# Patient Record
Sex: Female | Born: 1940 | Race: White | Hispanic: No | Smoking: Former smoker
Health system: Southern US, Community
[De-identification: ages and names within clinical notes are randomized; demographics above are authoritative.]

## PROBLEM LIST (undated history)

## (undated) DIAGNOSIS — K219 Gastro-esophageal reflux disease without esophagitis: Secondary | ICD-10-CM

## (undated) DIAGNOSIS — M1990 Unspecified osteoarthritis, unspecified site: Secondary | ICD-10-CM

## (undated) DIAGNOSIS — K5790 Diverticulosis of intestine, part unspecified, without perforation or abscess without bleeding: Secondary | ICD-10-CM

## (undated) DIAGNOSIS — I4891 Unspecified atrial fibrillation: Secondary | ICD-10-CM

## (undated) DIAGNOSIS — R0600 Dyspnea, unspecified: Secondary | ICD-10-CM

## (undated) DIAGNOSIS — I10 Essential (primary) hypertension: Secondary | ICD-10-CM

## (undated) DIAGNOSIS — C50919 Malignant neoplasm of unspecified site of unspecified female breast: Secondary | ICD-10-CM

## (undated) DIAGNOSIS — K449 Diaphragmatic hernia without obstruction or gangrene: Secondary | ICD-10-CM

## (undated) DIAGNOSIS — F419 Anxiety disorder, unspecified: Secondary | ICD-10-CM

## (undated) DIAGNOSIS — Z79890 Hormone replacement therapy (postmenopausal): Secondary | ICD-10-CM

## (undated) DIAGNOSIS — M8580 Other specified disorders of bone density and structure, unspecified site: Secondary | ICD-10-CM

## (undated) DIAGNOSIS — E785 Hyperlipidemia, unspecified: Secondary | ICD-10-CM

## (undated) HISTORY — DX: Unspecified atrial fibrillation: I48.91

## (undated) HISTORY — DX: Essential (primary) hypertension: I10

## (undated) HISTORY — DX: Diaphragmatic hernia without obstruction or gangrene: K44.9

## (undated) HISTORY — DX: Malignant neoplasm of unspecified site of unspecified female breast: C50.919

## (undated) HISTORY — DX: Other specified disorders of bone density and structure, unspecified site: M85.80

## (undated) HISTORY — DX: Diverticulosis of intestine, part unspecified, without perforation or abscess without bleeding: K57.90

## (undated) HISTORY — PX: CATARACT EXTRACTION W/ INTRAOCULAR LENS IMPLANT: SHX1309

## (undated) HISTORY — DX: Hyperlipidemia, unspecified: E78.5

## (undated) HISTORY — DX: Hormone replacement therapy: Z79.890

## (undated) HISTORY — DX: Gastro-esophageal reflux disease without esophagitis: K21.9

---

## 1993-04-22 HISTORY — PX: HYSTEROSCOPY: SHX211

## 1997-04-22 HISTORY — PX: BREAST SURGERY: SHX581

## 1997-05-05 ENCOUNTER — Other Ambulatory Visit: Admission: RE | Admit: 1997-05-05 | Discharge: 1997-05-05 | Payer: Self-pay | Admitting: Radiology

## 1998-05-31 ENCOUNTER — Other Ambulatory Visit: Admission: RE | Admit: 1998-05-31 | Discharge: 1998-05-31 | Payer: Self-pay | Admitting: Radiology

## 1998-08-29 ENCOUNTER — Other Ambulatory Visit: Admission: RE | Admit: 1998-08-29 | Discharge: 1998-08-29 | Payer: Self-pay | Admitting: Physical Therapy

## 1999-09-05 ENCOUNTER — Other Ambulatory Visit: Admission: RE | Admit: 1999-09-05 | Discharge: 1999-09-05 | Payer: Self-pay | Admitting: Obstetrics and Gynecology

## 2000-09-05 ENCOUNTER — Other Ambulatory Visit: Admission: RE | Admit: 2000-09-05 | Discharge: 2000-09-05 | Payer: Self-pay | Admitting: Obstetrics and Gynecology

## 2001-08-25 ENCOUNTER — Other Ambulatory Visit: Admission: RE | Admit: 2001-08-25 | Discharge: 2001-08-25 | Payer: Self-pay | Admitting: *Deleted

## 2002-01-23 ENCOUNTER — Encounter (INDEPENDENT_AMBULATORY_CARE_PROVIDER_SITE_OTHER): Payer: Self-pay | Admitting: *Deleted

## 2002-01-23 ENCOUNTER — Ambulatory Visit (HOSPITAL_COMMUNITY): Admission: RE | Admit: 2002-01-23 | Discharge: 2002-01-23 | Payer: Self-pay | Admitting: *Deleted

## 2002-06-23 HISTORY — PX: BREAST DUCTAL SYSTEM EXCISION: SHX5242

## 2002-07-06 ENCOUNTER — Encounter (INDEPENDENT_AMBULATORY_CARE_PROVIDER_SITE_OTHER): Payer: Self-pay | Admitting: Specialist

## 2002-07-06 ENCOUNTER — Ambulatory Visit (HOSPITAL_BASED_OUTPATIENT_CLINIC_OR_DEPARTMENT_OTHER): Admission: RE | Admit: 2002-07-06 | Discharge: 2002-07-06 | Payer: Self-pay | Admitting: General Surgery

## 2002-08-27 ENCOUNTER — Other Ambulatory Visit: Admission: RE | Admit: 2002-08-27 | Discharge: 2002-08-27 | Payer: Self-pay | Admitting: Obstetrics and Gynecology

## 2003-09-07 ENCOUNTER — Other Ambulatory Visit: Admission: RE | Admit: 2003-09-07 | Discharge: 2003-09-07 | Payer: Self-pay | Admitting: Obstetrics and Gynecology

## 2004-09-11 ENCOUNTER — Other Ambulatory Visit: Admission: RE | Admit: 2004-09-11 | Discharge: 2004-09-11 | Payer: Self-pay | Admitting: Obstetrics and Gynecology

## 2005-06-07 ENCOUNTER — Encounter: Admission: RE | Admit: 2005-06-07 | Discharge: 2005-06-07 | Payer: Self-pay | Admitting: General Surgery

## 2006-09-10 ENCOUNTER — Ambulatory Visit (HOSPITAL_COMMUNITY): Admission: RE | Admit: 2006-09-10 | Discharge: 2006-09-10 | Payer: Self-pay | Admitting: *Deleted

## 2006-09-23 DIAGNOSIS — K5790 Diverticulosis of intestine, part unspecified, without perforation or abscess without bleeding: Secondary | ICD-10-CM

## 2006-09-23 HISTORY — DX: Diverticulosis of intestine, part unspecified, without perforation or abscess without bleeding: K57.90

## 2006-10-10 ENCOUNTER — Other Ambulatory Visit: Admission: RE | Admit: 2006-10-10 | Discharge: 2006-10-10 | Payer: Self-pay | Admitting: Obstetrics and Gynecology

## 2006-10-28 ENCOUNTER — Encounter (INDEPENDENT_AMBULATORY_CARE_PROVIDER_SITE_OTHER): Payer: Self-pay | Admitting: *Deleted

## 2006-10-28 ENCOUNTER — Ambulatory Visit (HOSPITAL_COMMUNITY): Admission: RE | Admit: 2006-10-28 | Discharge: 2006-10-28 | Payer: Self-pay | Admitting: *Deleted

## 2007-07-01 ENCOUNTER — Encounter: Admission: RE | Admit: 2007-07-01 | Discharge: 2007-07-01 | Payer: Self-pay | Admitting: Surgery

## 2007-08-17 ENCOUNTER — Emergency Department (HOSPITAL_COMMUNITY): Admission: EM | Admit: 2007-08-17 | Discharge: 2007-08-17 | Payer: Self-pay | Admitting: Emergency Medicine

## 2007-10-01 ENCOUNTER — Encounter (INDEPENDENT_AMBULATORY_CARE_PROVIDER_SITE_OTHER): Payer: Self-pay | Admitting: *Deleted

## 2007-10-01 ENCOUNTER — Ambulatory Visit (HOSPITAL_COMMUNITY): Admission: RE | Admit: 2007-10-01 | Discharge: 2007-10-01 | Payer: Self-pay | Admitting: *Deleted

## 2007-10-27 ENCOUNTER — Inpatient Hospital Stay (HOSPITAL_COMMUNITY): Admission: AD | Admit: 2007-10-27 | Discharge: 2007-10-28 | Payer: Self-pay | Admitting: Internal Medicine

## 2007-10-27 ENCOUNTER — Encounter: Payer: Self-pay | Admitting: Emergency Medicine

## 2008-11-05 LAB — HM PAP SMEAR

## 2009-09-22 DIAGNOSIS — I4891 Unspecified atrial fibrillation: Secondary | ICD-10-CM

## 2009-09-22 HISTORY — DX: Unspecified atrial fibrillation: I48.91

## 2009-10-12 ENCOUNTER — Ambulatory Visit (HOSPITAL_COMMUNITY): Admission: RE | Admit: 2009-10-12 | Discharge: 2009-10-12 | Payer: Self-pay | Admitting: Interventional Cardiology

## 2010-02-06 ENCOUNTER — Ambulatory Visit: Admit: 2010-02-06 | Payer: Self-pay | Admitting: Internal Medicine

## 2010-03-14 ENCOUNTER — Encounter: Payer: Self-pay | Admitting: Gastroenterology

## 2010-03-21 NOTE — Letter (Signed)
Summary: New Patient letter  [facility]  [address] [place], [place] [address]   Phone: [phone]  Fax: [phone]       03/14/2010 MRN: [id number]  Anna Mathews [address] [place], [place]  [address]  [place]  Dear Anna Mathews,  Welcome to the Gastroenterology Division at [facility].    You are scheduled to see Dr.  Christella Hartigan on 04-21-10 at 2:45pm on the 3rd floor at [facility], [address]  We ask that you try to arrive at our office 15 minutes prior to your appointment time to allow for check-in.  We would like you to complete the enclosed self-administered evaluation form prior to your visit and bring it with you on the day of your appointment.  We will review it with you.  Also, please bring a complete list of all your medications or, if you prefer, bring the medication bottles and we will list them.  Please bring your insurance card so that we may make a copy of it.  If your insurance requires a referral to see a specialist, please bring your referral form from your primary care physician.  Co-payments are due at the time of your visit and may be paid by cash, check or credit card.     Your office visit will consist of a consult with your physician (includes a physical exam), any laboratory testing he/she may order, scheduling of any necessary diagnostic testing (e.g. x-ray, ultrasound, CT-scan), and scheduling of a procedure (e.g. Endoscopy, Colonoscopy) if required.  Please allow enough time on your schedule to allow for any/all of these possibilities.    If you cannot keep your appointment, please call [phone] to cancel or reschedule prior to your appointment date.  This allows [place] the opportunity to schedule an appointment for another patient in need of care.  If you do not cancel or reschedule by 5 p.m. the business day prior to your appointment date, you will be charged a $50.00 late cancellation/no-show fee.    Thank you for choosing  [facility] for your medical needs.  We appreciate the opportunity to care for you.  Please visit [place] at our website  to learn more about our practice.                     Sincerely,                                                             The Gastroenterology Division

## 2010-04-06 LAB — PROTIME-INR
INR: 2.08 — ABNORMAL HIGH (ref 0.00–1.49)
Prothrombin Time: 23.5 seconds — ABNORMAL HIGH (ref 11.6–15.2)

## 2010-04-21 ENCOUNTER — Ambulatory Visit: Payer: Self-pay | Admitting: Gastroenterology

## 2010-06-06 NOTE — Op Note (Signed)
NAMEEddie, Anna Mathews Mckynlie                 ACCOUNT NO.:  [id number]   MEDICAL RECORD NO.:  [id number]          PATIENT TYPE:  AMB   LOCATION:  ENDO                         FACILITY[facility]  [facility]   PHYSICIAN:  Georgiana Spinner, M.D.    DATE OF BIRTH:  [birth year]   DATE OF PROCEDURE:  DATE OF DISCHARGE:                               OPERATIVE REPORT   PROCEDURE:  Upper endoscopy.   INDICATIONS:  Abdominal pain.   ANESTHESIA:  Fentanyl 75 mcg, Versed 8 mg.   DESCRIPTION OF PROCEDURE:  With the patient mildly sedated in the left  lateral decubitus position, the Pentax videoscopic endoscope was  inserted in the mouth and passed under direct vision through the  esophagus which appeared normal until we reached the distal esophagus  and there were two distinct islands of what appeared to be Barrett's  esophagus, photographed and biopsies taken.  We entered into the stomach  through a hiatal hernia.  Fundus, body, antrum, duodenal bulb and second  portion of the duodenum were visualized and a biopsy for Helicobacter  pylori was taken at the patient's request from the antrum.  The  endoscope was then slowly withdrawn, taking circumferential views of the  duodenal mucosa until the endoscope had been pulled back into the  stomach and placed in retroflexion to view the stomach from below.  The  endoscope was straightened and withdrawn, taking circumferential views  of remaining gastric and esophageal mucosa.  The patient vital signs and  pulse oximeter remained stable.  The patient tolerated the procedure  well without apparent complications.   FINDINGS:  Loose wrap of the GE junction around the endoscope indicating  laxity of the lower esophageal sphincter, associated with a hiatal  hernia and areas that appeared to be Barrett's esophagus endoscopically.   PLAN:  Await biopsy report.  The patient will call me for results and  follow-up with me as an outpatient.           ______________________________  Georgiana Spinner, M.D.     GMO/MEDQ  D:  10/01/2007  T:  10/01/2007  Job:  [id number]   cc:   Caryn Bee L. Little, M.D.  Fax: [phone]

## 2010-06-06 NOTE — Op Note (Signed)
NAMEKalyssa, Anna Mathews                 ACCOUNT NO.:  [id number]   MEDICAL RECORD NO.:  [id number]          PATIENT TYPE:  AMB   LOCATION:  ENDO                         FACILITY:  [facility]   PHYSICIAN:  Georgiana Spinner, M.D.    DATE OF BIRTH:  Sep 12, 1940   DATE OF PROCEDURE:  10/28/2006  DATE OF DISCHARGE:                               OPERATIVE REPORT   PROCEDURE:  Upper endoscopy.   INDICATIONS:  GI bleeding with black stools reported.   ANESTHESIA:  Fentanyl 50 mcg, Versed 5 mg.   PROCEDURE:  With the patient mildly sedated in the left lateral  decubitus position, the Pentax videoscopic endoscope was inserted in the  mouth, passed under direct vision through the esophagus which appeared  normal until we reached distal esophagus and she had two small islands  of definite Barrett's esophagus photographed and biopsies taken.  We  entered into the stomach and the fundus, body and antrum, all appeared  normal.  There was no evidence of bleeding actively at this time.  Duodenal bulb, second portion of duodenum also appeared normal.  From  this point, the endoscope was slowly withdrawn taking circumferential  views of the duodenal mucosa until the endoscope had been pulled back  into the stomach, placed in retroflexion to view the stomach from below.  The endoscope was straightened and withdrawn, taking circumferential  views of the remaining gastric and esophageal mucosa.  [name]  vital signs and pulse oximeter remained stable.  The patient tolerated  the procedure well without apparent complications.   FINDINGS:  Barrett's esophagus with a very loose wrap of the GE junction  around the endoscope indicating laxity of the lower esophageal  sphincter, otherwise an unremarkable examination.  The patient reports  that the black stools have ceased.   PLAN:  Await biopsy report.  Continue the Nexium.  The patient will call  me for results and follow-up with me as an outpatient.      ______________________________  Georgiana Spinner, M.D.     GMO/MEDQ  D:  10/28/2006  T:  10/28/2006  Job:  [id number]   cc:   Caryn Bee L. Little, M.D.  Fax: [phone]

## 2010-06-06 NOTE — Assessment & Plan Note (Signed)
[facility]                                 ON-CALL NOTE   NAME:Mathews, Anna                          MRN:          [id number]  DATE:08/17/2007                            DOB:          08-Sep-1940    PHONE NUMBER:  [phone].   GASTROENTEROLOGIST:  Georgiana Spinner, M.D.   PRIMARY CARE PHYSICIAN:  Caryn Bee L. Little, M.D.   REASON FOR THE CALL:  Abdominal pain and dark stools.   DATE OF THE CALL:  August 17, 2007.   TIME OF THE CALL:  11:50 a.m.   BODY:  The patient called today complaining of abdominal pain and dark  stools.  She says she has had some dizziness for about 3 weeks.  Also,  says she has had problems with her hiatal hernia.  Tells me that she  had problems with abdominal pain in the past for which she underwent  colonoscopy and upper endoscopy with Dr. Virginia Rochester.  She tells me that  nothing was found, though she may have some precancerous spots in her  duodenum.  She also states that she has had some heart fluttering.  Yesterday, she developed one dark tarry stool.  Two days prior to that,  she had been using Mylanta or Maalox.  She had one small dark stools  this morning.  Her son checked her blood pressure and pulse (he is a  Theatre stage manager), which were stable.  She takes aspirin and Pepcid  daily.  She was wondering what to do.  I told the patient that I could  not exclude GI bleed.  Dark stools could be secondary to the antacid.  I  recommended that she go to [facility] for evaluation by the  emergency room personal.  She was agreeable and appreciative.     Wilhemina Bonito. Marina Goodell, MD  Electronically Signed    JNP/MedQ  DD: 08/17/2007  DT: 08/17/2007  Job #: [id number]   cc:   Georgiana Spinner, M.D.

## 2010-06-06 NOTE — H&P (Signed)
NAME:  Narramore, Madyson                 ACCOUNT NO.:  [id number]   MEDICAL RECORD NO.:  [id number]          PATIENT TYPE:  INP   LOCATION:  6741                         FACILITY:  [facility]   PHYSICIAN:  Corinna L. Lendell Caprice, MDDATE OF BIRTH:  11/05/1940   DATE OF ADMISSION:  10/27/2007  DATE OF DISCHARGE:  10/28/2007                              HISTORY & PHYSICAL   CHIEF COMPLAINT:  Passed out.   HISTORY OF PRESENT ILLNESS:  Ms. Buttery is a pleasant 70 year old white  female with history of frequent left upper quadrant pain that has been  ascribed to gastroesophageal reflux disease.  She reports that her pain  has been escalating and becoming more frequent.  It is bad enough that  she has not been eating as much and has lost 6 pounds recently.  She is  also on hydrochlorothiazide and Prinivil.  She was gardening when she  began having left upper quadrant cramping, which was typical for her  usual reflux pains.  She describes some as spasms.  She had no chest  pain.  She had no shortness of breath.  She subsequently took a shower  and the next thing she can recall, she was on the bathroom floor.  She  hit her head, but had no confusion after the episode.  She has had  multiple endoscopies and apparently other workup for this pain.  She had  been on Carafate in the past. but stopped this.  She also had been on it  sounds like 40 mg of Prilosec, but decreased it to 20 for some reason.  She feels well now.   PAST MEDICAL HISTORY:  Hypertension, hyperlipidemia, osteopenia, and  gastroesophageal reflux disease.   MEDICATIONS:  Hydrochlorothiazide dose unknown, simvastatin 40 mg a day,  Evista 60 mg a day, quinapril 40 mg a day, calcium with vitamin D, and  Prilosec 20 mg a day.   SOCIAL HISTORY:  The patient is married, retired Runner, broadcasting/film/video.  She does not  smoke.  She has no history of drugs or heavy drinking.   FAMILY HISTORY:  Her mother died in her 105s.  Her father died of COPD.  Her brother  is healthy.   REVIEW OF SYSTEMS:  As above, otherwise negative.   PHYSICAL EXAMINATION:  VITAL SIGNS:  Blood pressure in the emergency  room was 125/53, pulse 73, respiratory rate 20 and temperature 98.2.  GENERAL:  The patient is well-nourished, well-developed in no acute  distress.  HEENT:  Normocephalic and atraumatic.  Pupils equal, round, reactive to  light.  Sclerae nonicteric.  Moist mucous membranes.  She has a hematoma  over her occipital scalp area.  NECK:  Supple.  No lymphadenopathy.  LUNGS:  Clear to auscultation bilaterally without wheezes, rhonchi, or  rales.  CARDIOVASCULAR:  Regular rate and rhythm without murmurs, gallops, or  rubs.  ABDOMEN:  Normal bowel sounds, soft, nontender, and nondistended.  GU:  Deferred.  RECTAL:  Deferred.  EXTREMITIES:  No clubbing, cyanosis, or edema.  SKIN:  No rash.  NEUROLOGIC:  The patient is alert and oriented.  Cranial nerves,  sensory, and motor exam intact.  PSYCHIATRIC:  Normal affect.   LABORATORY DATA:  CBC unremarkable.  Basic metabolic panel significant  for BUN of 25 with a creatinine of 0.7.  Point-of-care enzymes negative.  Urinalysis negative.  EKG is not on the chart, but was reportedly  negative according to ED physician.  CT of the brain shows high left  parietal scalp hematoma, nothing acute.  CT of the C-spine shows no  fracture, left-sided foraminal narrowing at C2-3, right-sided foraminal  narrowing at C3-4, and left greater than right foraminal narrowing at C4-  5.  Chest x-ray, diffuse negative except for degenerative changes of the  thoracic spine.   ASSESSMENT AND PLAN:  1. Syncopal episode, suspect vasovagal in the setting of dehydration      and vasodilatation in the shower.  The patient will be placed on 23-      hour observation.  She will get intravenous fluids.  I will rule      out myocardial infarction.  Stop her hydrochlorothiazide.  Her      reflux has been quite bothersome and I will resume  her Carafate,      she had been on this in the past.  I do not think this is cardiac,      but if her symptoms continue after discharge, she may want cardiac      workup.  Unless, she has chest pain here or further symptoms, I do      not think that she needs a workup other than ruling out myocardial      infarction by cardiac enzymes.  She will remain on telemetry.  2. Hypertension.  See above.  I will decrease her Prinivil dose as      well.  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  4. Gastroesophageal reflux disease.      Corinna L. Lendell Caprice, MD  Electronically Signed     CLS/MEDQ  D:  10/28/2007  T:  10/29/2007  Job:  [id number]   cc:   Sigmund Hazel, M.D.

## 2010-06-06 NOTE — Op Note (Signed)
NAMECella, Anna Mathews                 ACCOUNT NO.:  [id number]   MEDICAL RECORD NO.:  [id number]          PATIENT TYPE:  AMB   LOCATION:  ENDO                         FACILITY:  [facility]   PHYSICIAN:  Georgiana Spinner, M.D.    DATE OF BIRTH:  [birth year]   DATE OF PROCEDURE:  09/10/2006  DATE OF DISCHARGE:                               OPERATIVE REPORT   PROCEDURE:  Colonoscopy.   INDICATIONS:  Rectal bleeding.   ANESTHESIA:  Fentanyl 150 mcg, Versed 15 mg.   PROCEDURE:  With the patient mildly sedated in the left lateral  decubitus position, the Pentax videoscopic colonoscope was inserted in  the rectum and passed under direct vision with pressure applied, and the  patient turned to her back.  Subsequently, her right side we were able  to advance the endoscope; to eventually find the cecum identified by  ileocecal valve and appendiceal orifice.  The cecum did not want to open  up originally,  so therefore it took some turning and pressure and  maneuvering of the scope to get in.  But, once we were able to see the  cecum, we were able to clear the cecum fairly well.  From this point the  colonoscope was then slowly withdrawn, taking circumferential views of  the colonic mucosa and stopping only in the rectum -- which appeared  normal on direct and showed hemorrhoids on retroflexed view.  The  endoscope was straightened and withdrawn.  [name] vital signs,  pulse oximetry remained stable.  The patient tolerated the procedure  well without apparent complications.   FINDINGS:  Tortuous colon, difficult to traverse; but we did reach the  cecum.   IMPRESSION:  Rectal bleeding, probably on the basis of hemorrhoidal  process.   PLAN:  To follow up with me as needed.  Repeat examination probably in  10 years, according to current guidelines           ______________________________  Georgiana Spinner, M.D.     GMO/MEDQ  D:  09/10/2006  T:  09/10/2006  Job:  [id number]

## 2010-06-09 NOTE — Discharge Summary (Signed)
NAME:  Anna Mathews, Anna Mathews                 ACCOUNT NO.:  [id number]   MEDICAL RECORD NO.:  [id number]          PATIENT TYPE:  INP   LOCATION:  6741                         FACILITY:  [facility]   PHYSICIAN:  Corinna L. Lendell Caprice, MDDATE OF BIRTH:  1940/02/23   DATE OF ADMISSION:  10/27/2007  DATE OF DISCHARGE:  10/28/2007                               DISCHARGE SUMMARY   DISCHARGE DIAGNOSES:  1. Syncope, most likely vasovagal in the setting of dehydration.  2. Esophageal reflux disease.  3. Degenerative disk disease.  4. Hypertension.   DISCHARGE MEDICATIONS:  1. Stop hydrochlorothiazide.  2. Decrease lisinopril to 20 mg a day.  3. Start Carafate slurry 1 gram before meals and at bedtime.  4. She may continue simvastatin 40 mg a day.  5. Evista 60 mg a day.  6. Calcium with vitamin D.  7. Prilosec 20 mg a day.   FOLLOWUP:  Follow up with Dr. Hyacinth Meeker in 2-4 weeks.   ACTIVITY:  Increase slowly.   CONDITION:  Stable.   CONSULTATIONS:  None.   PROCEDURES:  None.   DIET:  Should be low-cholesterol.   PERTINENT LABORATORY DATA:  Basic metabolic panel on admission  significant for a BUN of 21, creatinine 0.71, otherwise unremarkable.  Point-of-care enzymes negative.   SPECIAL STUDIES/RADIOLOGY:  EKG was not on the chart, but reportedly  normal according to ED physician.  CT of the brain showed high left  parietal scalp hematoma without underlying fracture.  CT of the cervical  spine showed no acute fracture, left-sided foraminal narrowing at C2-C3,  right-sided foraminal narrowing at C3-C4, and left greater than right  foraminal narrowing at C4-C5.  Also a chest x-ray two-views showed  nothing acute, degenerative changes of the thoracic spine.   HISTORY AND HOSPITAL COURSE:  [name] is a pleasant 70 year old  white female with a history of hypertension, gastroesophageal reflux  disease, and chronic recurrent left upper quadrant pain that has been  ascribed to her reflux disease.   She has been losing weight because of  the worsening pain.  Apparently, she has had multiple endoscopies for  workup.  She had been on hydrochlorothiazide and quinapril.  She had  felt this particularly severe bout of the pain and subsequently passed  out after having taken a shower.  She hit her head.  She had normal  vital signs on admission and hematoma over the occipital scalp area, but  otherwise unremarkable exam.  She was placed on 23-hour observation.  Given the fact that she was on hydrochlorothiazide and had a high BUN to  creatinine ratio, I felt that she may be slightly volume depleted.  She  received IV fluids and her hydrochlorothiazide was stopped.  Also, her  reflux pain has been bad enough that she has not been eating as much as  usual and has lost weight.  I started her on Carafate.  MI was ruled  out.  The patient had no recurrence of her pain or other symptoms and  was discharged home.  It was felt that this was vasovagal episode in the  setting  of dehydration and vasodilatation from the hot shower.      Corinna L. Lendell Caprice, MD  Electronically Signed     CLS/MEDQ  D:  12/03/2007  T:  12/04/2007  Job:  [id number]   cc:   Sigmund Hazel, M.D.

## 2010-06-09 NOTE — Op Note (Signed)
   NAME:  Anna Mathews, Anna Mathews                           ACCOUNT NO.:  [id number]   MEDICAL RECORD NO.:  [id number]                   PATIENT TYPE:  AMB   LOCATION:  ENDO                                 [facility]ACILITY[facility]  [facility]   PHYSICIAN:  Georgiana Spinner, M.D.                 DATE OF BIRTH:  1940/05/25   DATE OF PROCEDURE:  01/23/2002  DATE OF DISCHARGE:                                 OPERATIVE REPORT   PROCEDURE:  Colonoscopy.   INDICATIONS:  Colon cancer screening.   ANESTHESIA:  None further given.   DESCRIPTION OF PROCEDURE:  With the patient mildly sedated in the left  lateral decubitus position, the Olympus videoscopic colonoscope was inserted  in the rectum and passed under direct vision to the cecum, identified by  ileocecal valve and appendiceal orifice, both of which were photographed.  From this point the colonoscope was slowly withdrawn, taking circumferential  views of the entire colonic mucosa, stopping only in the rectum, which  appeared normal on direct and showed hemorrhoids on retroflexed view.  The  endoscope was straightened and withdrawn.  [name] vital signs and  pulse oximetry remained stable.  The patient tolerated the procedure well  without apparent complication.   FINDINGS:  Small internal hemorrhoids, rare diverticulum of the colon,  otherwise unremarkable colonoscopic examination.   PLAN:  Possibly repeat in five to 10 years.                                               Georgiana Spinner, M.D.    GMO/MEDQ  D:  01/23/2002  T:  01/23/2002  Job:  [id number]   cc:   Caryn Bee L. Little, M.D.  [address]  [place]  [place] [address]  Fax: [phone]

## 2010-06-09 NOTE — Op Note (Signed)
   NAME:  Anna Mathews, Anna Mathews                           ACCOUNT NO.:  [id number]   MEDICAL RECORD NO.:  [id number]                   PATIENT TYPE:  AMB   LOCATION:  DSC                                  FACILITY:  [facility]   PHYSICIAN:  Rose Phi. Maple Hudson, M.D.                DATE OF BIRTH:  1940/08/07   DATE OF PROCEDURE:  07/06/2002  DATE OF DISCHARGE:                                 OPERATIVE REPORT   PREOPERATIVE DIAGNOSIS:  Intraductal papilloma of the left breast.   POSTOPERATIVE DIAGNOSIS:  Intraductal papilloma of the left breast.   OPERATION:  Excision of duct system of the left breast.   SURGEON:  Rose Phi. Maple Hudson, M.D.   ANESTHESIA:  MAC.   INDICATIONS:  This patient had presented with a bloody nipple discharge  coming from about the 7 o'clock position of her left breast.  Ductogram  showed a filling defect and she is scheduled now for excision.   DESCRIPTION OF PROCEDURE:  The patient was placed on the operating table  with the arms extended on the arm board.  The left breast was prepped and  draped in the usual fashion.  Again, one could express a bloody nipple  discharge from right at the 7 o'clock position.  A circumareolar incision  was outlined with a pencil and we then thoroughly infiltrated this with  local anesthesia.  An incision was made and I dissected an areolar flap up  to underneath the nipple and then excised a wedge of the duct system for the  8, 7, and 6 o'clock positions.  Hemostasis was obtained with the cautery.  Subcuticular closure of 4-0 Monocryl and Steri-Strips was carried out.  Dressing applied.  The patient was transferred to the recovery room in  satisfactory condition having tolerated the procedure well.                                               Rose Phi. Maple Hudson, M.D.    PRY/MEDQ  D:  07/06/2002  T:  07/06/2002  Job:  [id number]

## 2010-06-09 NOTE — Op Note (Signed)
   NAME:  Anna Mathews, Anna Mathews                           ACCOUNT NO.:  [id number]   MEDICAL RECORD NO.:  [id number]                   PATIENT TYPE:  AMB   LOCATION:  ENDO                                 FACILITY:  [facility]   PHYSICIAN:  Georgiana Spinner, M.D.                 DATE OF BIRTH:  1940-07-02   DATE OF PROCEDURE:  DATE OF DISCHARGE:                                 OPERATIVE REPORT   PROCEDURE:  Upper endoscopy.   INDICATIONS:  Gastroesophageal reflux disease.   ANESTHESIA:  Demerol 60, Versed 7 mg.   PROCEDURE:  With the patient mildly sedated in the left lateral decubitus  position, the Olympus video endoscope was inserted into the mouth and passed  under direct visualization through the esophagus which appeared normal.  There was a small hiatal hernia which was photographed.   We entered into the stomach. The fundus appeared normal; the  body and  antrum showed changes of linear ulcerations and erosions and some scattered  erythematous changes consistent with possibly nonsteroidal anti-inflammatory  drug induced gastritis and erosions, possibly due to Fosamax I guess. This  was photographed and biopsied.   We entered into the duodenal bulb and the second portion of the duodenum.  Both appeared normal except for some mild erythematous changes in the bulb.   The endoscope was then pulled back, taking circumferential views of the  remaining duodenal mucosa. The endoscope was then pulled back into the  stomach and placed in retroflexion to view the stomach from below. Once  again the hiatal hernia was visualized. The endoscope was then straightened  and withdrawn, taking several more views of the remaining gastric and  esophageal mucosa.   [name] vital signs and pulse oximetry remained stable. The patient  tolerated the procedure well without apparent complications.   FINDINGS:  1. Small hiatal hernia.  2. Changes in body and antrum of erosions, biopsied, possibly  nonsteroidal     anti-inflammatory drug induced or possibly Fosamax induced.    PLAN:  Will await biopsy report. The patient will call me for results and  follow up with me as an outpatient. Proceed to colonoscopy as planned.                                               Georgiana Spinner, M.D.    GMO/MEDQ  D:  01/23/2002  T:  01/23/2002  Job:  [id number]   cc:   Caryn Bee L. Little, M.D.  [address]  [place]  [place] [address]  Fax: [phone]

## 2010-10-20 LAB — DIFFERENTIAL
Basophils Absolute: 0
Basophils Relative: 0
Eosinophils Absolute: 0
Eosinophils Relative: 0
Lymphocytes Relative: 21
Lymphs Abs: 2
Monocytes Absolute: 0.5
Monocytes Relative: 5
Neutro Abs: 6.8
Neutrophils Relative %: 73

## 2010-10-20 LAB — COMPREHENSIVE METABOLIC PANEL
ALT: 30
AST: 29
Albumin: 4.3
Alkaline Phosphatase: 55
BUN: 24 — ABNORMAL HIGH
CO2: 24
Calcium: 9.5
Chloride: 105
Creatinine, Ser: 0.77
GFR calc Af Amer: >60
GFR calc non Af Amer: >60
Glucose, Bld: 136 — ABNORMAL HIGH
Potassium: 3.5
Sodium: 139
Total Bilirubin: 0.4
Total Protein: 7.2

## 2010-10-20 LAB — CBC
HCT: 42.7
Hemoglobin: 14.3
MCHC: 33.4
MCV: 92.6
Platelets: 220
RBC: 4.61
RDW: 13
WBC: 9.3

## 2010-10-20 LAB — OCCULT BLOOD X 1 CARD TO LAB, STOOL: Fecal Occult Bld: NEGATIVE

## 2010-10-20 LAB — LIPASE, BLOOD: Lipase: 19

## 2010-10-23 LAB — BASIC METABOLIC PANEL
BUN: 21
CO2: 25
Calcium: 8.7
Chloride: 107
Creatinine, Ser: 0.71
GFR calc Af Amer: >60
GFR calc non Af Amer: >60
Glucose, Bld: 105 — ABNORMAL HIGH
Potassium: 3.9
Sodium: 139

## 2010-10-23 LAB — CARDIAC PANEL(CRET KIN+CKTOT+MB+TROPI)
CK, MB: 2.3
CK, MB: 2.6
Relative Index: INVALID
Relative Index: INVALID
Total CK: 65
Total CK: 74
Troponin I: 0.01
Troponin I: 0.03

## 2010-10-24 LAB — DIFFERENTIAL
Basophils Absolute: 0
Basophils Relative: 0
Eosinophils Absolute: 0.1
Eosinophils Relative: 1
Lymphocytes Relative: 18
Lymphs Abs: 1.7
Monocytes Absolute: 0.5
Monocytes Relative: 5
Neutro Abs: 7.4
Neutrophils Relative %: 77

## 2010-10-24 LAB — COMPREHENSIVE METABOLIC PANEL
ALT: 18
AST: 30
Albumin: 4.7
Alkaline Phosphatase: 73
BUN: 25 — ABNORMAL HIGH
CO2: 29
Calcium: 9.9
Chloride: 104
Creatinine, Ser: 0.7
GFR calc Af Amer: >60
GFR calc non Af Amer: >60
Glucose, Bld: 117 — ABNORMAL HIGH
Potassium: 3.9
Sodium: 145
Total Bilirubin: 0.3
Total Protein: 8

## 2010-10-24 LAB — CBC
HCT: 42.8
Hemoglobin: 14.5
MCHC: 33.9
MCV: 91.7
Platelets: 221
RBC: 4.66
RDW: 12.7
WBC: 9.7

## 2010-10-24 LAB — URINE CULTURE: Colony Count: >=100000

## 2010-10-24 LAB — POCT CARDIAC MARKERS
CKMB, poc: 2.1
Myoglobin, poc: 73.3
Troponin i, poc: <0.05

## 2010-10-24 LAB — URINALYSIS, ROUTINE W REFLEX MICROSCOPIC
Bilirubin Urine: NEGATIVE
Glucose, UA: NEGATIVE
Hgb urine dipstick: NEGATIVE
Ketones, ur: NEGATIVE
Nitrite: NEGATIVE
Protein, ur: NEGATIVE
Specific Gravity, Urine: 1.013
Urobilinogen, UA: 0.2
pH: 7.5

## 2010-10-24 LAB — PROTIME-INR
INR: 0.9
Prothrombin Time: 12.6

## 2010-10-24 LAB — URINE MICROSCOPIC-ADD ON

## 2011-01-04 DIAGNOSIS — L280 Lichen simplex chronicus: Secondary | ICD-10-CM | POA: Insufficient documentation

## 2011-09-19 LAB — HM DEXA SCAN

## 2011-09-19 LAB — HM MAMMOGRAPHY: HM Mammogram: NEGATIVE

## 2012-11-06 ENCOUNTER — Ambulatory Visit (INDEPENDENT_AMBULATORY_CARE_PROVIDER_SITE_OTHER): Payer: PRIVATE HEALTH INSURANCE | Admitting: Pharmacist

## 2012-11-06 ENCOUNTER — Encounter (INDEPENDENT_AMBULATORY_CARE_PROVIDER_SITE_OTHER): Payer: Self-pay

## 2012-11-06 DIAGNOSIS — I4891 Unspecified atrial fibrillation: Secondary | ICD-10-CM

## 2012-11-06 DIAGNOSIS — I480 Paroxysmal atrial fibrillation: Secondary | ICD-10-CM | POA: Insufficient documentation

## 2012-11-06 LAB — POCT INR: INR: 3

## 2012-12-11 ENCOUNTER — Ambulatory Visit (INDEPENDENT_AMBULATORY_CARE_PROVIDER_SITE_OTHER): Payer: PRIVATE HEALTH INSURANCE | Admitting: Pharmacist

## 2012-12-11 DIAGNOSIS — I4891 Unspecified atrial fibrillation: Secondary | ICD-10-CM

## 2012-12-11 LAB — POCT INR: INR: 3.1

## 2012-12-30 ENCOUNTER — Telehealth: Payer: Self-pay | Admitting: Interventional Cardiology

## 2012-12-30 NOTE — Telephone Encounter (Signed)
Returned call to pt, pt missed 2.5mg  dosage on 12/28/12, took 5mg  yesterday as rx.  Pt's last INR was 3.1 and we did not lower pt's dosage.  Discussed with Riki Rusk PharmD, advised pt to continue on same dosage 5mg  daily except 2.5mg  on Su, Tu, Th.  Keep scheduled f/u appt.  Pt verbalized understanding.

## 2012-12-30 NOTE — Telephone Encounter (Signed)
New Message  Pt called states she didn't take her coumadin on Sunday night// requests a call back to regulate//SR

## 2013-01-08 ENCOUNTER — Ambulatory Visit (INDEPENDENT_AMBULATORY_CARE_PROVIDER_SITE_OTHER): Payer: PRIVATE HEALTH INSURANCE | Admitting: *Deleted

## 2013-01-08 DIAGNOSIS — I4891 Unspecified atrial fibrillation: Secondary | ICD-10-CM

## 2013-01-08 LAB — POCT INR: INR: 2.3

## 2013-02-05 ENCOUNTER — Ambulatory Visit (INDEPENDENT_AMBULATORY_CARE_PROVIDER_SITE_OTHER): Payer: PRIVATE HEALTH INSURANCE | Admitting: Pharmacist

## 2013-02-05 DIAGNOSIS — I4891 Unspecified atrial fibrillation: Secondary | ICD-10-CM

## 2013-02-05 LAB — POCT INR: INR: 2

## 2013-02-24 ENCOUNTER — Other Ambulatory Visit: Payer: Self-pay | Admitting: *Deleted

## 2013-02-24 MED ORDER — RALOXIFENE HCL 60 MG PO TABS
60.0000 mg | ORAL_TABLET | Freq: Every day | ORAL | Status: DC
Start: 2013-02-24 — End: 2013-03-31

## 2013-02-24 NOTE — Telephone Encounter (Signed)
Last AEX and refill 03/10/2012 #3 mons/3 refills. Next appt 03/12/2013  Will refill until appt.

## 2013-03-05 ENCOUNTER — Ambulatory Visit (INDEPENDENT_AMBULATORY_CARE_PROVIDER_SITE_OTHER): Payer: PRIVATE HEALTH INSURANCE | Admitting: *Deleted

## 2013-03-05 DIAGNOSIS — I4891 Unspecified atrial fibrillation: Secondary | ICD-10-CM

## 2013-03-05 LAB — POCT INR: INR: 2.3

## 2013-03-10 ENCOUNTER — Encounter: Payer: Self-pay | Admitting: Nurse Practitioner

## 2013-03-12 ENCOUNTER — Ambulatory Visit: Payer: Self-pay | Admitting: Nurse Practitioner

## 2013-03-27 ENCOUNTER — Other Ambulatory Visit: Payer: Self-pay | Admitting: *Deleted

## 2013-03-27 NOTE — Telephone Encounter (Signed)
Incoming fax from [facility] requesting Vit D 50,000.  Last Vitamin D check 03/10/2012 results = 55 - Normal Last AEX and refill 03/10/2012 #26/3 refills Next appt 04/02/2013  Please advise.

## 2013-03-29 MED ORDER — VITAMIN D (ERGOCALCIFEROL) 1.25 MG (50000 UNIT) PO CAPS: 50000.0000 [IU] | ORAL_CAPSULE | ORAL | Status: DC

## 2013-03-31 ENCOUNTER — Ambulatory Visit (INDEPENDENT_AMBULATORY_CARE_PROVIDER_SITE_OTHER): Payer: PRIVATE HEALTH INSURANCE | Admitting: Nurse Practitioner

## 2013-03-31 ENCOUNTER — Encounter: Payer: Self-pay | Admitting: Nurse Practitioner

## 2013-03-31 VITALS — BP 118/70 | HR 56 | Resp 16 | Ht 63.5 in | Wt 153.0 lb

## 2013-03-31 DIAGNOSIS — E559 Vitamin D deficiency, unspecified: Secondary | ICD-10-CM

## 2013-03-31 DIAGNOSIS — M899 Disorder of bone, unspecified: Secondary | ICD-10-CM

## 2013-03-31 DIAGNOSIS — M949 Disorder of cartilage, unspecified: Secondary | ICD-10-CM

## 2013-03-31 DIAGNOSIS — I4891 Unspecified atrial fibrillation: Secondary | ICD-10-CM

## 2013-03-31 DIAGNOSIS — N6042 Mammary duct ectasia of left breast: Secondary | ICD-10-CM

## 2013-03-31 DIAGNOSIS — I10 Essential (primary) hypertension: Secondary | ICD-10-CM | POA: Insufficient documentation

## 2013-03-31 DIAGNOSIS — N6049 Mammary duct ectasia of unspecified breast: Secondary | ICD-10-CM

## 2013-03-31 DIAGNOSIS — M8580 Other specified disorders of bone density and structure, unspecified site: Secondary | ICD-10-CM | POA: Insufficient documentation

## 2013-03-31 DIAGNOSIS — Z01419 Encounter for gynecological examination (general) (routine) without abnormal findings: Secondary | ICD-10-CM

## 2013-03-31 MED ORDER — RALOXIFENE HCL 60 MG PO TABS: 60.0000 mg | ORAL_TABLET | Freq: Every day | ORAL | Status: DC

## 2013-03-31 MED ORDER — VITAMIN D (ERGOCALCIFEROL) 1.25 MG (50000 UNIT) PO CAPS: 50000.0000 [IU] | ORAL_CAPSULE | ORAL | Status: DC

## 2013-03-31 NOTE — Progress Notes (Signed)
Patient ID: Anna Mathews, female   DOB: [birth year], 73 y.o.   MRN: [id number] 73 y.o. G12P2002 Married Caucasian Fe here for annual exam.  Doing well except for husband who is age 19 and multiple health issues.  Patient's last menstrual period was 01/23/1988.          Sexually active: no  The current method of family planning is none.    Exercising: yes  Gym/ health club routine includes treadmill, yoga and and other machines.  Yoga once weekly.. Smoker:  no  Health Maintenance: Pap:  11/05/08, WNL MMG: 09/18/12, Bi-Rads 1: negative Colonoscopy:  2009, repeat 5 years BMD:   08/2011, -1.3/-1.6/-0.4 TDaP:  07/2009 Labs:  PCP   reports that she quit smoking about 45 years ago. Her smoking use included Cigarettes. She started smoking about 56 years ago. She has a 3 pack-year smoking history. She has never used smokeless tobacco. She reports that she drinks alcohol. She reports that she does not use illicit drugs.  Past Medical History  Diagnosis Date  . Hyperlipidemia   . Hypertension   . GERD (gastroesophageal reflux disease)   . Hiatal hernia   . Osteopenia   . Diverticulosis 09/2006  . Atrial fibrillation 09/2009    on coumadin  . Postmenopausal hormone replacement therapy 1989 - 07/2000    Past Surgical History  Procedure Laterality Date  . Breast surgery Left 4/99    breast biopsy - fibrosis  . Breast ductal system excision Left 06/2002    duct ectasia with fibrosis - atypical lobular hyperplasia with micro calcifications - tamoxifen X 28 months then change to Evista 10/06  . Hysteroscopy  4/95    w/D&C  . Cesarean section      X 2    Current Outpatient Prescriptions  Medication Sig Dispense Refill  . amLODipine (NORVASC) 5 MG tablet Take 1 tablet by mouth daily.      . Calcium Carbonate (CALTRATE 600 PO) Take 1 capsule by mouth 2 (two) times daily. Chocolate chew      . flecainide (TAMBOCOR) 100 MG tablet Take 1 tablet by mouth 2 (two) times daily.      . metoprolol  succinate (TOPROL-XL) 50 MG 24 hr tablet Take 0.5 tablets by mouth daily.      . Omega-3 Fatty Acids (OMEGA-3 PO) Take 4 tablets by mouth daily.      . raloxifene (EVISTA) 60 MG tablet Take 1 tablet (60 mg total) by mouth daily.  90 tablet  3  . simvastatin (ZOCOR) 20 MG tablet Take 1 tablet by mouth daily.      . Vitamin D, Ergocalciferol, (DRISDOL) 50000 UNITS CAPS capsule Take 1 capsule (50,000 Units total) by mouth every 7 (seven) days.  30 capsule  3  . warfarin (COUMADIN) 5 MG tablet as directed.       No current facility-administered medications for this visit.    Family History  Problem Relation Age of Onset  . Hypertension Mother   . Heart disease Father   . COPD Father   . Diabetes Maternal Aunt   . Diabetes Maternal Grandfather   . Breast cancer Maternal Grandfather 80  . Diabetes Maternal Aunt   . Diabetes Son   . Rheum arthritis Brother   . Kidney cancer Brother     ROS:  Pertinent items are noted in HPI.  Otherwise, a comprehensive ROS was negative.  Exam:   BP 118/70  Pulse 56  Resp 16  Ht 5' 3.5" (1.613  m)  Wt 153 lb (69.4 kg)  BMI 26.67 kg/m2  LMP 01/23/1988 Height: 5' 3.5" (161.3 cm)  Ht Readings from Last 3 Encounters:  03/31/13 5' 3.5" (1.613 m)    General appearance: alert, cooperative and appears stated age Head: Normocephalic, without obvious abnormality, atraumatic Neck: no adenopathy, supple, symmetrical, trachea midline and thyroid normal to inspection and palpation Lungs: clear to auscultation bilaterally Breasts: normal appearance, no masses or tenderness Heart: regular rate and rhythm Abdomen: soft, non-tender; no masses,  no organomegaly Extremities: extremities normal, atraumatic, no cyanosis or edema Skin: Skin color, texture, turgor normal. No rashes or lesions Lymph nodes: Cervical, supraclavicular, and axillary nodes normal. No abnormal inguinal nodes palpated Neurologic: Grossly normal   Pelvic: External genitalia:  no lesions               Urethra:  normal appearing urethra with no masses, tenderness or lesions              Bartholin's and Skene's: normal                 Vagina: atrophic appearing vagina with normal color and discharge, no lesions. There is a small vaginal wall cyst on the left midway.              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

## 2013-03-31 NOTE — Patient Instructions (Signed)

## 2013-04-01 LAB — VITAMIN D 25 HYDROXY (VIT D DEFICIENCY, FRACTURES): Vit D, 25-Hydroxy: 71 ng/mL (ref 30–89)

## 2013-04-02 ENCOUNTER — Ambulatory Visit (INDEPENDENT_AMBULATORY_CARE_PROVIDER_SITE_OTHER): Payer: PRIVATE HEALTH INSURANCE | Admitting: Pharmacist Clinician (PhC)/ Clinical Pharmacy Specialist

## 2013-04-02 DIAGNOSIS — I4891 Unspecified atrial fibrillation: Secondary | ICD-10-CM

## 2013-04-02 LAB — POCT INR: INR: 2.4

## 2013-04-03 NOTE — Progress Notes (Signed)
Encounter reviewed by Dr. Brook Silva.  

## 2013-04-09 ENCOUNTER — Telehealth: Payer: Self-pay | Admitting: *Deleted

## 2013-04-09 NOTE — Telephone Encounter (Signed)
I have attempted to contact this patient by phone with the following results: left message to return my call on answering machine (home).  

## 2013-04-09 NOTE — Telephone Encounter (Signed)
Message copied by Graylon Good on Thu Apr 09, 2013 10:42 AM ------      Message from: Kem Boroughs R      Created: Wed Apr 01, 2013  8:27 AM       Let patient know that Vit D is high at 21 and she can come off RX and go to OTC Vit D per protocol ------

## 2013-04-15 NOTE — Telephone Encounter (Signed)
I have attempted to contact this patient by phone with the following results: left message to return my call on answering machine (home).  

## 2013-05-01 ENCOUNTER — Ambulatory Visit (INDEPENDENT_AMBULATORY_CARE_PROVIDER_SITE_OTHER): Payer: PRIVATE HEALTH INSURANCE | Admitting: Pharmacist

## 2013-05-01 DIAGNOSIS — I4891 Unspecified atrial fibrillation: Secondary | ICD-10-CM

## 2013-05-01 LAB — POCT INR: INR: 2.8

## 2013-05-05 NOTE — Telephone Encounter (Signed)
I have attempted to contact this patient by phone with the following results: left message to return my call with female at home number.

## 2013-05-07 ENCOUNTER — Encounter: Payer: Self-pay | Admitting: *Deleted

## 2013-05-08 NOTE — Telephone Encounter (Signed)
Letter mailed on 05/07/13.  Closing encounter.

## 2013-06-09 ENCOUNTER — Ambulatory Visit (INDEPENDENT_AMBULATORY_CARE_PROVIDER_SITE_OTHER): Payer: PRIVATE HEALTH INSURANCE | Admitting: Interventional Cardiology

## 2013-06-09 ENCOUNTER — Encounter: Payer: Self-pay | Admitting: Interventional Cardiology

## 2013-06-09 ENCOUNTER — Ambulatory Visit (INDEPENDENT_AMBULATORY_CARE_PROVIDER_SITE_OTHER): Payer: PRIVATE HEALTH INSURANCE

## 2013-06-09 VITALS — BP 116/50 | HR 54 | Wt 154.0 lb

## 2013-06-09 DIAGNOSIS — Z7901 Long term (current) use of anticoagulants: Secondary | ICD-10-CM

## 2013-06-09 DIAGNOSIS — I4891 Unspecified atrial fibrillation: Secondary | ICD-10-CM

## 2013-06-09 DIAGNOSIS — I10 Essential (primary) hypertension: Secondary | ICD-10-CM

## 2013-06-09 LAB — POCT INR: INR: 2.4

## 2013-06-09 NOTE — Progress Notes (Signed)
Patient ID: Anna Mathews, female   DOB: 1940/10/30, 73 y.o.   MRN: [id number]    [place], [place], [place]  [address] Phone: [phone] Fax:  [phone]  Date:  06/09/2013   ID:  Anna Mathews, DOB 04/11/1940, MRN [id number]  PCP:  Tawanna Solo, MD      History of Present Illness: Anna Mathews is a 73 y.o. female who has AFib. We tried to see if she would tolerate less flecainide. She felt short of breath after a couple of days and resumed her usual dose. Atrial Fibrillation F/U:  Denies : Chest pain.  Dizziness.  Orthopnea.  Palpitations.  Syncope.  Some variability with coumadin dose.    Wt Readings from Last 3 Encounters:  06/09/13 154 lb (69.854 kg)  03/31/13 153 lb (69.4 kg)     Past Medical History  Diagnosis Date  . Hyperlipidemia   . Hypertension   . GERD (gastroesophageal reflux disease)   . Hiatal hernia   . Osteopenia   . Diverticulosis 09/2006  . Atrial fibrillation 09/2009    on coumadin  . Postmenopausal hormone replacement therapy 1989 - 07/2000    Current Outpatient Prescriptions  Medication Sig Dispense Refill  . amLODipine (NORVASC) 5 MG tablet Take 1 tablet by mouth daily.      . Calcium Carbonate (CALTRATE 600 PO) Take 1 capsule by mouth 2 (two) times daily. Chocolate chew      . flecainide (TAMBOCOR) 100 MG tablet Take 1 tablet by mouth 2 (two) times daily.      . metoprolol succinate (TOPROL-XL) 50 MG 24 hr tablet Take 0.5 tablets by mouth daily.      . Omega-3 Fatty Acids (OMEGA-3 PO) Take 4 tablets by mouth daily.      . quinapril (ACCUPRIL) 40 MG tablet Take 40 mg by mouth at bedtime.      . raloxifene (EVISTA) 60 MG tablet Take 1 tablet (60 mg total) by mouth daily.  90 tablet  3  . simvastatin (ZOCOR) 20 MG tablet Take 1 tablet by mouth daily.      . Vitamin D, Ergocalciferol, (DRISDOL) 50000 UNITS CAPS capsule Take 1 capsule (50,000 Units total) by mouth every 7 (seven) days.  30 capsule  3  . warfarin (COUMADIN)  5 MG tablet as directed.       No current facility-administered medications for this visit.    Allergies:    Allergies  Allergen Reactions  . Penicillins   . Sulfa Antibiotics     Social History:  The patient  reports that she quit smoking about 45 years ago. Her smoking use included Cigarettes. She started smoking about 56 years ago. She has a 3 pack-year smoking history. She has never used smokeless tobacco. She reports that she drinks alcohol. She reports that she does not use illicit drugs.   Family History:  [name] family history includes Breast cancer (age of onset: 27) in her maternal grandfather; COPD in her father; Diabetes in her maternal aunt, maternal aunt, maternal grandfather, and son; Heart disease in her father; Hypertension in her mother; Kidney cancer in her brother; Rheum arthritis in her brother.   ROS:  Please see the history of present illness.  No nausea, vomiting.  No fevers, chills.  No focal weakness.  No dysuria.    All other systems reviewed and negative.   PHYSICAL EXAM: VS:  BP 116/50  Pulse 54  Wt 154 lb (69.854 kg)  LMP 01/23/1988 Well nourished, well developed, in no acute distress HEENT: normal Neck: no JVD, no carotid bruits Cardiac:  normal S1, S2; RRR;  Lungs:  clear to auscultation bilaterally, no wheezing, rhonchi or rales Abd: soft, nontender, no hepatomegaly Ext: no edema Skin: warm and dry Neuro:   no focal abnormalities noted  EKG:  NSR, prolonged PR,IVCD     ASSESSMENT AND PLAN:  Atrial fibrillation  Continue Flecainide Acetate Tablet, 100 MG Not Specified, 1 tablet, Orally (new rx as of 10/31/11), twice a day Notes: maintaining sinus rhythm.    2. Admission for long-term (current) use of anticoagulants  Notes: INR to be checked and Coumadin dose to be adjusted with Pharm D. Please see his note for details.    3. Essential hypertension, benign  Continue Amlodipine Besylate Tablet, 5 MG Tablet, 1 tablet, Orally, Once a  day Continue Metoprolol Succinate Tablet Extended Release 24 Hour, 50 MG Tablet, 1/2 tablet, Orally, Once a day Continue Accupril Tablet, 40 MG, 1 tablet, Orally, Once a day Notes: Controlled at home. elevated here. She will bring her home cuff in at next visit to check accuracy.    Preventive Medicine  Adult topics discussed:  Diet: healthy diet.  Exercise: 5 days a week, at least 30 minutes of aerobic exercise.      Signed, Mina Marble, MD, [facility] 06/09/2013 3:28 PM

## 2013-06-09 NOTE — Patient Instructions (Signed)
Your physician recommends that you continue on your current medications as directed. Please refer to the Current Medication list given to you today.  Your physician wants you to follow-up in: 1 year with Dr. Varanasi. You will receive a reminder letter in the mail two months in advance. If you don't receive a letter, please call our office to schedule the follow-up appointment.  

## 2013-06-24 ENCOUNTER — Other Ambulatory Visit: Payer: Self-pay | Admitting: Interventional Cardiology

## 2013-07-22 ENCOUNTER — Other Ambulatory Visit: Payer: Self-pay | Admitting: Interventional Cardiology

## 2013-07-23 ENCOUNTER — Ambulatory Visit (INDEPENDENT_AMBULATORY_CARE_PROVIDER_SITE_OTHER): Payer: PRIVATE HEALTH INSURANCE | Admitting: *Deleted

## 2013-07-23 DIAGNOSIS — I4891 Unspecified atrial fibrillation: Secondary | ICD-10-CM

## 2013-07-23 LAB — POCT INR: INR: 2.1

## 2013-07-29 ENCOUNTER — Other Ambulatory Visit: Payer: Self-pay | Admitting: Interventional Cardiology

## 2013-08-23 ENCOUNTER — Other Ambulatory Visit: Payer: Self-pay | Admitting: Interventional Cardiology

## 2013-08-24 ENCOUNTER — Telehealth: Payer: Self-pay | Admitting: Interventional Cardiology

## 2013-08-24 NOTE — Telephone Encounter (Signed)
New Prob   Requesting direction clarification for Warfarin prescription. Please call.

## 2013-08-24 NOTE — Telephone Encounter (Signed)
Spoke with [facility] pharmacist and verified warfarin instruction and quantity.

## 2013-08-24 NOTE — Telephone Encounter (Signed)
I do not refill Warfarin will forward to [employer]

## 2013-09-03 ENCOUNTER — Ambulatory Visit (INDEPENDENT_AMBULATORY_CARE_PROVIDER_SITE_OTHER): Payer: PRIVATE HEALTH INSURANCE | Admitting: Pharmacist

## 2013-09-03 DIAGNOSIS — I4891 Unspecified atrial fibrillation: Secondary | ICD-10-CM

## 2013-09-03 LAB — POCT INR: INR: 2.4

## 2013-10-15 ENCOUNTER — Ambulatory Visit (INDEPENDENT_AMBULATORY_CARE_PROVIDER_SITE_OTHER): Payer: PRIVATE HEALTH INSURANCE | Admitting: *Deleted

## 2013-10-15 DIAGNOSIS — I4891 Unspecified atrial fibrillation: Secondary | ICD-10-CM

## 2013-10-15 LAB — POCT INR: INR: 2.8

## 2013-11-23 ENCOUNTER — Encounter: Payer: Self-pay | Admitting: Interventional Cardiology

## 2013-11-26 ENCOUNTER — Ambulatory Visit (INDEPENDENT_AMBULATORY_CARE_PROVIDER_SITE_OTHER): Payer: PRIVATE HEALTH INSURANCE | Admitting: Pharmacist Clinician (PhC)/ Clinical Pharmacy Specialist

## 2013-11-26 DIAGNOSIS — I4891 Unspecified atrial fibrillation: Secondary | ICD-10-CM

## 2013-11-26 LAB — POCT INR: INR: 2.5

## 2013-12-22 ENCOUNTER — Other Ambulatory Visit: Payer: Self-pay | Admitting: Interventional Cardiology

## 2014-01-07 ENCOUNTER — Ambulatory Visit (INDEPENDENT_AMBULATORY_CARE_PROVIDER_SITE_OTHER): Payer: PRIVATE HEALTH INSURANCE | Admitting: Pharmacist Clinician (PhC)/ Clinical Pharmacy Specialist

## 2014-01-07 DIAGNOSIS — I4891 Unspecified atrial fibrillation: Secondary | ICD-10-CM

## 2014-01-07 LAB — POCT INR: INR: 2.7

## 2014-02-18 ENCOUNTER — Ambulatory Visit (INDEPENDENT_AMBULATORY_CARE_PROVIDER_SITE_OTHER): Payer: Medicare Other | Admitting: *Deleted

## 2014-02-18 DIAGNOSIS — I4891 Unspecified atrial fibrillation: Secondary | ICD-10-CM

## 2014-02-18 LAB — POCT INR: INR: 2.4

## 2014-03-10 ENCOUNTER — Telehealth: Payer: Self-pay | Admitting: Internal Medicine

## 2014-03-10 NOTE — Telephone Encounter (Signed)
Anna Mathews called after accidentally taking an extra dose of her medications this evening.  Specifically she took an extra: 100mg  flecanide 50mg  Toprol XL 60mg  Raloxifine 2.5mg  coumadin  I reassured her that this should not be an issue.  She would like to skip her AM flecanide which I said is fine.  She will call if further issues.  Stephani Police, MD

## 2014-03-24 ENCOUNTER — Other Ambulatory Visit: Payer: Self-pay | Admitting: Interventional Cardiology

## 2014-03-24 ENCOUNTER — Other Ambulatory Visit: Payer: Self-pay | Admitting: Nurse Practitioner

## 2014-03-25 ENCOUNTER — Telehealth: Payer: Self-pay | Admitting: Interventional Cardiology

## 2014-03-25 NOTE — Telephone Encounter (Signed)
I called and spoke with the patient. She needed her flecainide called in. I advised her this was sent in about 9:30 am this morning by the refill department. She is ok on all of her other medications per her report.

## 2014-03-25 NOTE — Telephone Encounter (Signed)
Medication refill request: Evista  Last AEX:  03/31/13 with PG Next AEX: No AEX scheduled for 2016  Last MMG (if hormonal medication request): 10/01/13 Breast Density Category B:Benign Refill authorized: ?  Please advise given patient's recent hx of atrial Fibrillation.

## 2014-03-25 NOTE — Telephone Encounter (Signed)
Diagnosis of A Fib in 2011 and on coumadin, so lower risk.

## 2014-03-25 NOTE — Telephone Encounter (Signed)
New MEssage  Pt wanted to speak w/ Rn about medication/ Please call back and discuss.

## 2014-04-02 ENCOUNTER — Ambulatory Visit (INDEPENDENT_AMBULATORY_CARE_PROVIDER_SITE_OTHER): Payer: Medicare Other | Admitting: *Deleted

## 2014-04-02 DIAGNOSIS — I4891 Unspecified atrial fibrillation: Secondary | ICD-10-CM

## 2014-04-02 LAB — POCT INR: INR: 2.8

## 2014-04-22 ENCOUNTER — Other Ambulatory Visit: Payer: Self-pay | Admitting: Interventional Cardiology

## 2014-05-13 ENCOUNTER — Ambulatory Visit (INDEPENDENT_AMBULATORY_CARE_PROVIDER_SITE_OTHER): Payer: Medicare Other | Admitting: *Deleted

## 2014-05-13 DIAGNOSIS — I4891 Unspecified atrial fibrillation: Secondary | ICD-10-CM

## 2014-05-13 LAB — POCT INR: INR: 3.1

## 2014-05-21 ENCOUNTER — Other Ambulatory Visit: Payer: Self-pay | Admitting: Interventional Cardiology

## 2014-05-29 ENCOUNTER — Other Ambulatory Visit: Payer: Self-pay | Admitting: Interventional Cardiology

## 2014-06-25 ENCOUNTER — Ambulatory Visit (INDEPENDENT_AMBULATORY_CARE_PROVIDER_SITE_OTHER): Payer: Medicare Other

## 2014-06-25 ENCOUNTER — Encounter: Payer: Self-pay | Admitting: Interventional Cardiology

## 2014-06-25 ENCOUNTER — Other Ambulatory Visit: Payer: Self-pay | Admitting: Interventional Cardiology

## 2014-06-25 ENCOUNTER — Ambulatory Visit (INDEPENDENT_AMBULATORY_CARE_PROVIDER_SITE_OTHER): Payer: Medicare Other | Admitting: Interventional Cardiology

## 2014-06-25 VITALS — BP 116/64 | HR 48 | Ht 64.0 in | Wt 153.1 lb

## 2014-06-25 DIAGNOSIS — I4891 Unspecified atrial fibrillation: Secondary | ICD-10-CM

## 2014-06-25 DIAGNOSIS — I10 Essential (primary) hypertension: Secondary | ICD-10-CM | POA: Diagnosis not present

## 2014-06-25 DIAGNOSIS — Z7901 Long term (current) use of anticoagulants: Secondary | ICD-10-CM

## 2014-06-25 LAB — POCT INR: INR: 2.1

## 2014-06-25 MED ORDER — METOPROLOL SUCCINATE ER 25 MG PO TB24: 25.0000 mg | ORAL_TABLET | Freq: Every day | ORAL | Status: DC

## 2014-06-25 MED ORDER — AMLODIPINE BESYLATE 5 MG PO TABS: 5.0000 mg | ORAL_TABLET | Freq: Every day | ORAL | Status: DC

## 2014-06-25 MED ORDER — METOPROLOL SUCCINATE ER 50 MG PO TB24: 25.0000 mg | ORAL_TABLET | Freq: Every day | ORAL | Status: DC

## 2014-06-25 NOTE — Progress Notes (Signed)
Patient ID: Anna Mathews, female   DOB: [birth year], 74 y.o.   MRN: [id number]     Cardiology Office Note   Date:  06/25/2014   ID:  Anna Mathews, DOB 27-Oct-1940, MRN [id number]  PCP:  Anna Solo, MD    No chief complaint on file. AFib   Wt Readings from Last 3 Encounters:  06/25/14 153 lb 1.9 oz (69.455 kg)  06/09/13 154 lb (69.854 kg)  03/31/13 153 lb (69.4 kg)       History of Present Illness: Anna Mathews is a 74 y.o. female  who has AFib. We tried to see if she would tolerate less flecainide. She felt short of breath after a couple of days and resumed her usual dose. Atrial Fibrillation F/U:  Denies : Chest pain.  Dizziness.  Orthopnea.  Palpitations.  Syncope.  More stable coumadin dose.  No bleeding problems.    Past Medical History  Diagnosis Date  . Hyperlipidemia   . Hypertension   . GERD (gastroesophageal reflux disease)   . Hiatal hernia   . Osteopenia   . Diverticulosis 09/2006  . Atrial fibrillation 09/2009    on coumadin  . Postmenopausal hormone replacement therapy 1989 - 07/2000    Past Surgical History  Procedure Laterality Date  . Breast surgery Left 4/99    breast biopsy - fibrosis  . Breast ductal system excision Left 06/2002    duct ectasia with fibrosis - atypical lobular hyperplasia with micro calcifications - tamoxifen X 28 months then change to Evista 10/06  . Hysteroscopy  4/95    w/D&C  . Cesarean section      X 2     Current Outpatient Prescriptions  Medication Sig Dispense Refill  . amLODipine (NORVASC) 5 MG tablet TAKE ONE TABLET BY MOUTH ONE TIME DAILY 30 tablet 0  . Calcium Carbonate (CALTRATE 600 PO) Take 1 capsule by mouth 2 (two) times daily. Chocolate chew    . CINNAMON PO Take 1 tablet by mouth daily.    . Coenzyme Q10 (COQ10 PO) Take 1 capsule by mouth daily.    . flecainide (TAMBOCOR) 100 MG tablet TAKE ONE TABLET BY MOUTH TWICE DAILY 60 tablet 1  . GINKGO BILOBA PO Take 1 tablet by mouth daily.    .  metoprolol succinate (TOPROL-XL) 50 MG 24 hr tablet TAKE HALF TABLET BY MOUTH DAILY 15 tablet 0  . Omega-3 Fatty Acids (OMEGA-3 PO) Take 3 tablets by mouth daily.     . quinapril (ACCUPRIL) 40 MG tablet Take 40 mg by mouth at bedtime.    . raloxifene (EVISTA) 60 MG tablet TAKE ONE TABLET BY MOUTH ONE TIME DAILY  90 tablet 2  . simvastatin (ZOCOR) 20 MG tablet Take 1 tablet by mouth daily.    Marland Kitchen warfarin (COUMADIN) 5 MG tablet TAKE AS DIRECTED BY ANTICOAGULATION CLININC 30 tablet 3   No current facility-administered medications for this visit.    Allergies:   Penicillins and Sulfa antibiotics    Social History:  The patient  reports that she quit smoking about 46 years ago. Her smoking use included Cigarettes. She started smoking about 57 years ago. She has a 3 pack-year smoking history. She has never used smokeless tobacco. She reports that she drinks alcohol. She reports that she does not use illicit drugs.   Family History:  [name] *family history includes Breast cancer in her maternal grandmother; COPD in her father; Diabetes in her maternal aunt, maternal aunt, maternal grandfather, and  son; Heart disease in her father; Hypertension in her mother; Kidney cancer in her brother; Rheum arthritis in her brother.    ROS:  Please see the history of present illness.   Otherwise, review of systems are positive for occasional fatigue.   All other systems are reviewed and negative.    PHYSICAL EXAM: VS:  BP 116/64 mmHg  Pulse 48  Ht 5\' 4"  (1.626 m)  Wt 153 lb 1.9 oz (69.455 kg)  BMI 26.27 kg/m2  LMP 01/23/1988 , BMI Body mass index is 26.27 kg/(m^2). GEN: Well nourished, well developed, in no acute distress HEENT: normal Neck: no JVD, carotid bruits, or masses Cardiac: bradycardic; 2/6 systolic murmur, rubs, or gallops,no edema  Respiratory:  clear to auscultation bilaterally, normal work of breathing GI: soft, nontender, nondistended, + BS MS: no deformity or atrophy Skin: warm  and dry, no rash Neuro:  Strength and sensation are intact Psych: euthymic mood, full affect   EKG:   The ekg ordered today demonstrates SB, no ST segment changes   Recent Labs: No results found for requested labs within last 365 days.   Lipid Panel No results found for: CHOL, TRIG, HDL, CHOLHDL, VLDL, LDLCALC, LDLDIRECT   Other studies Reviewed: Additional studies/ records that were reviewed today with results demonstrating: ETT in 2011- nondiagnostic.   ASSESSMENT AND PLAN:  Atrial fibrillation  Continue Flecainide Acetate Tablet, 100 MG Not Specified, 1 tablet, Orally (new rx as of 10/31/11), twice a day Notes: maintaining sinus rhythm. Some bradycardia.  TOld her sx of bradycardia to watch for.    2. Admission for long-term (current) use of anticoagulants  Notes: INR to be checked and Coumadin dose to be adjusted with Pharm D. Please see his note for details.    3. Essential hypertension, benign  Continue Amlodipine Besylate Tablet, 5 MG Tablet, 1 tablet, Orally, Once a day Continue Metoprolol Succinate Tablet Extended Release 24 Hour, 50 MG Tablet, 1/2 tablet, Orally, Once a day Continue Accupril Tablet, 40 MG, 1 tablet, Orally, Once a day Notes: Controlled at home.           Aortic sclerosis by 2011 echo.  Current medicines are reviewed at length with the patient today.  The patient concerns regarding her medicines were addressed.  The following changes have been made:  No change  Labs/ tests ordered today include: obtain lab results from Dr. Osborne Mathews. No orders of the defined types were placed in this encounter.    Recommend 150 minutes/week of aerobic exercise Low fat, low carb, high fiber diet recommended  Disposition:   FU in 1 year   Teresita Madura., MD  06/25/2014 2:26 PM    [facility] [place], [place], [place]  [address] Phone: [phone]; Fax: [phone]

## 2014-06-25 NOTE — Patient Instructions (Signed)
Medication Instructions:  Sent in Amlodipine and Metoprolol to pt's pharmacy that was requested  Labwork: None  Testing/Procedures: None  Follow-Up: Your physician wants you to follow-up in: 1 year You will receive a reminder letter in the mail two months in advance. If you don't receive a letter, please call our office to schedule the follow-up appointment.   Any Other Special Instructions Will Be Listed Below (If Applicable).

## 2014-06-25 NOTE — Telephone Encounter (Signed)
Per note 6.3.16 

## 2014-06-28 ENCOUNTER — Other Ambulatory Visit: Payer: Self-pay | Admitting: Interventional Cardiology

## 2014-07-27 ENCOUNTER — Other Ambulatory Visit: Payer: Self-pay | Admitting: Interventional Cardiology

## 2014-07-27 ENCOUNTER — Other Ambulatory Visit: Payer: Self-pay

## 2014-07-27 MED ORDER — FLECAINIDE ACETATE 100 MG PO TABS: 100.0000 mg | ORAL_TABLET | Freq: Two times a day (BID) | ORAL | Status: DC

## 2014-08-11 ENCOUNTER — Ambulatory Visit (INDEPENDENT_AMBULATORY_CARE_PROVIDER_SITE_OTHER): Payer: Medicare Other | Admitting: *Deleted

## 2014-08-11 DIAGNOSIS — I4891 Unspecified atrial fibrillation: Secondary | ICD-10-CM

## 2014-08-11 LAB — POCT INR: INR: 1.9

## 2014-09-02 ENCOUNTER — Ambulatory Visit (INDEPENDENT_AMBULATORY_CARE_PROVIDER_SITE_OTHER): Payer: Medicare Other

## 2014-09-02 DIAGNOSIS — I4891 Unspecified atrial fibrillation: Secondary | ICD-10-CM

## 2014-09-02 LAB — POCT INR: INR: 2.5

## 2014-09-30 ENCOUNTER — Ambulatory Visit (INDEPENDENT_AMBULATORY_CARE_PROVIDER_SITE_OTHER): Payer: Medicare Other | Admitting: *Deleted

## 2014-09-30 DIAGNOSIS — I4891 Unspecified atrial fibrillation: Secondary | ICD-10-CM

## 2014-09-30 LAB — POCT INR: INR: 2.5

## 2014-10-23 ENCOUNTER — Other Ambulatory Visit: Payer: Self-pay | Admitting: Interventional Cardiology

## 2014-10-28 ENCOUNTER — Ambulatory Visit (INDEPENDENT_AMBULATORY_CARE_PROVIDER_SITE_OTHER): Payer: Medicare Other | Admitting: *Deleted

## 2014-10-28 DIAGNOSIS — I4891 Unspecified atrial fibrillation: Secondary | ICD-10-CM

## 2014-10-28 LAB — POCT INR: INR: 3

## 2014-12-03 ENCOUNTER — Ambulatory Visit (INDEPENDENT_AMBULATORY_CARE_PROVIDER_SITE_OTHER): Payer: Medicare Other | Admitting: *Deleted

## 2014-12-03 DIAGNOSIS — I4891 Unspecified atrial fibrillation: Secondary | ICD-10-CM | POA: Diagnosis not present

## 2014-12-03 LAB — POCT INR: INR: 2.6

## 2014-12-23 ENCOUNTER — Other Ambulatory Visit: Payer: Self-pay | Admitting: Nurse Practitioner

## 2014-12-23 NOTE — Telephone Encounter (Signed)
Medication refill request: Evista Last AEX:  03-31-13 Next AEX: not yet scheduled  Last MMG (if hormonal medication request): 09-26-13 WNL Refill authorized: please advise

## 2014-12-24 NOTE — Telephone Encounter (Signed)
Attempted to reach patient at number provided [phone], there was no answer or voicemail available. Left message at home number provided [phone] to call Reise Gladney at [phone].  Rx for Evista 60 mg take one tablet daily #90 0RF sent to pharmacy on file.

## 2014-12-24 NOTE — Telephone Encounter (Signed)
I reviewed the patients last office note. Mrs Anna Mathews stated that she would give her Evista for a year, but only sent in 9 months. Please refill until 3/17. Please advise the patient that she is overdue for her mammogram. I noticed she has afib and is on coumadin, please confirm she is aware that Evista increases her risk of forming a clot, the coumadin should help prevent that.

## 2014-12-24 NOTE — Telephone Encounter (Signed)
Spoke with patient. Advised of message as seen below from [place]. Patient is agreeable and verbalizes understanding. Patient states she has had her mammogram within the last two months with [facility]. Advised I will call to request the report for her chart can be updated. Patient is agreeable. Advised patient of risk with taking Evista for developing clots. Patient is agreeable.  Spoke with Teola Bradley last mammogram was done in 09/2014. Results to be faxed to our office.  Routing to provider for final review. Patient agreeable to disposition. Will close encounter.

## 2015-01-07 ENCOUNTER — Ambulatory Visit (INDEPENDENT_AMBULATORY_CARE_PROVIDER_SITE_OTHER): Payer: Medicare Other | Admitting: *Deleted

## 2015-01-07 DIAGNOSIS — I4891 Unspecified atrial fibrillation: Secondary | ICD-10-CM | POA: Diagnosis not present

## 2015-01-07 LAB — POCT INR: INR: 2.4

## 2015-02-09 ENCOUNTER — Other Ambulatory Visit: Payer: Self-pay | Admitting: Interventional Cardiology

## 2015-02-17 ENCOUNTER — Ambulatory Visit (INDEPENDENT_AMBULATORY_CARE_PROVIDER_SITE_OTHER): Payer: Medicare Other | Admitting: *Deleted

## 2015-02-17 DIAGNOSIS — I4891 Unspecified atrial fibrillation: Secondary | ICD-10-CM

## 2015-02-17 LAB — POCT INR: INR: 2.1

## 2015-03-10 ENCOUNTER — Telehealth: Payer: Self-pay | Admitting: Interventional Cardiology

## 2015-03-10 NOTE — Telephone Encounter (Signed)
New message  Pt called states that she skipped a dose last night but she took it this morning. Request a call back to discuss how to proceed.

## 2015-03-10 NOTE — Telephone Encounter (Signed)
Returned call to pt & she stated she was unsure if she took her Warfarin versus Simvastatin. She took the dose within 12 hours of missing but still was unsure what she took. Advised pt that since she was unsure of what she took that she will proceed with normal dose. She verbalized understanding & advised to call back with an other questions.

## 2015-03-26 ENCOUNTER — Other Ambulatory Visit: Payer: Self-pay | Admitting: Obstetrics and Gynecology

## 2015-03-28 NOTE — Telephone Encounter (Signed)
Needs AEX and another BMD.  She has very ill husband.  Can she schedule.  OK for 3 months refill until she can come in.

## 2015-03-28 NOTE — Telephone Encounter (Signed)
Medication refill request: Evista Last AEX:  03-31-13 Next AEX: not scheduled Last MMG (if hormonal medication request): 10-04-14 WNL Refill authorized: please advise

## 2015-03-31 ENCOUNTER — Ambulatory Visit (INDEPENDENT_AMBULATORY_CARE_PROVIDER_SITE_OTHER): Payer: Medicare Other | Admitting: *Deleted

## 2015-03-31 ENCOUNTER — Other Ambulatory Visit: Payer: Self-pay | Admitting: Nurse Practitioner

## 2015-03-31 DIAGNOSIS — I4891 Unspecified atrial fibrillation: Secondary | ICD-10-CM | POA: Diagnosis not present

## 2015-03-31 LAB — POCT INR: INR: 2.2

## 2015-03-31 NOTE — Telephone Encounter (Signed)
     Kem Boroughs, FNP at 03/28/2015 7:00 PM    Status: Signed      Expand All Collapse All    Needs AEX and another BMD. She has very ill husband. Can she schedule. OK for 3 months refill until she can come in.     Nicholes Rough, CMA at 03/28/2015 9:22 AM    Status: Signed      Expand All Collapse All    Medication refill request: Evista Last AEX: 03-31-13 Next AEX: not scheduled Last MMG (if hormonal medication request): 10-04-14 WNL Refill authorized: please advise

## 2015-04-01 NOTE — Telephone Encounter (Signed)
Patient calling to check on status of refill request. She scheduled an AEX for 04/12/15.

## 2015-04-01 NOTE — Telephone Encounter (Signed)
Pt scheduled appt. For aes 04-12-15. Please approve rx.

## 2015-04-01 NOTE — Telephone Encounter (Signed)
Message left for patient letting her know rx had been sent to pharmacy.

## 2015-04-12 ENCOUNTER — Encounter: Payer: Self-pay | Admitting: Nurse Practitioner

## 2015-04-12 ENCOUNTER — Ambulatory Visit (INDEPENDENT_AMBULATORY_CARE_PROVIDER_SITE_OTHER): Payer: Medicare Other | Admitting: Nurse Practitioner

## 2015-04-12 VITALS — BP 110/66 | HR 72 | Ht 63.0 in | Wt 157.0 lb

## 2015-04-12 DIAGNOSIS — Z0000 Encounter for general adult medical examination without abnormal findings: Secondary | ICD-10-CM | POA: Diagnosis not present

## 2015-04-12 DIAGNOSIS — Z1211 Encounter for screening for malignant neoplasm of colon: Secondary | ICD-10-CM | POA: Diagnosis not present

## 2015-04-12 DIAGNOSIS — Z01419 Encounter for gynecological examination (general) (routine) without abnormal findings: Secondary | ICD-10-CM

## 2015-04-12 MED ORDER — RALOXIFENE HCL 60 MG PO TABS: 60.0000 mg | ORAL_TABLET | Freq: Every day | ORAL | Status: DC

## 2015-04-12 NOTE — Progress Notes (Signed)
Encounter reviewed by Dr. Aundria Rud. I would recommend discontinuation of the Evista and consideration of other agents for osteopenia/osteoporosis if needed.  Bone density not scanned in to EPIC at this time.

## 2015-04-12 NOTE — Progress Notes (Signed)
Patient ID: Anna Mathews, female   DOB: [birth year], 75 y.o.   MRN: [id number]  74 y.o. [id number] Married  Caucasian Fe here for annual exam. Husband is 54 and in failing health.  He is unable to walk, she has some help with sitters at times. She had a BMD done in 10/04/14.  PCP has mentioned taking her off Evista and starting her on Fosamax.  She did take Fosamax for 5 yrs ending in 09/2006.  She had esophagitis, flare of hiatal hernia and worsening of GERD.  Endoscopy done by Dr. Lajoyce Corners in 10/01/2007 along with colonoscopy.  Patient's last menstrual period was 01/23/1988 (approximate).          Sexually active: No.  The current method of family planning is abstinence and post menopausal status.    Exercising: Yes.    yoga, treadmill and exercise equipment at gym 3-4 times per week Smoker:  no  Health Maintenance: Pap:  11/05/08, Negative MMG: 10/04/14, 3D, Bi-Rads 1: Negative Colonoscopy:  2009, repeat 10 years BMD:   08/2011, -1.3 Spine / -1.6 Right Femur Neck / -0.4 Left Femur Neck TDaP:  07/2009 Shingles: UTD with PCP, unsure date Pneumonia: UTD with PCP, unsure dates, may have gotten Prevnar 13 at visit in 11/2014 Hep C and HIV: Not indicated due to age Labs: 11/23/14, Vit D, Lipid and CMP with PCP in EPIC   reports that she quit smoking about 47 years ago. Her smoking use included Cigarettes. She started smoking about 58 years ago. She has a 3 pack-year smoking history. She has never used smokeless tobacco. She reports that she drinks alcohol. She reports that she does not use illicit drugs.  Past Medical History  Diagnosis Date  . Hyperlipidemia   . Hypertension   . GERD (gastroesophageal reflux disease)   . Hiatal hernia   . Osteopenia   . Diverticulosis 09/2006  . Atrial fibrillation ([place]) 09/2009    on coumadin  . Postmenopausal hormone replacement therapy 1989 - 07/2000    Past Surgical History  Procedure Laterality Date  . Breast surgery Left 4/99    breast biopsy - fibrosis  .  Breast ductal system excision Left 06/2002    duct ectasia with fibrosis - atypical lobular hyperplasia with micro calcifications - tamoxifen X 28 months then change to Evista 10/06  . Hysteroscopy  4/95    w/D&C  . Cesarean section      X 2    Current Outpatient Prescriptions  Medication Sig Dispense Refill  . amLODipine (NORVASC) 5 MG tablet TAKE ONE TABLET BY MOUTH ONE TIME DAILY 30 tablet 6  . Calcium Carbonate (CALTRATE 600 PO) Take 1 capsule by mouth 2 (two) times daily. Chocolate chew    . CINNAMON PO Take 1 tablet by mouth daily.    . Coenzyme Q10 (COQ10 PO) Take 1 capsule by mouth daily.    . flecainide (TAMBOCOR) 100 MG tablet Take 1 tablet (100 mg total) by mouth 2 (two) times daily. 60 tablet 11  . GINKGO BILOBA PO Take 1 tablet by mouth daily.    . metoprolol succinate (TOPROL-XL) 50 MG 24 hr tablet Take 1 tablet by mouth daily.    . Omega-3 Fatty Acids (OMEGA-3 PO) Take 3 tablets by mouth daily.     . quinapril (ACCUPRIL) 40 MG tablet Take 40 mg by mouth at bedtime.    . raloxifene (EVISTA) 60 MG tablet Take 1 tablet (60 mg total) by mouth daily. 90 tablet 4  .  simvastatin (ZOCOR) 20 MG tablet Take 1 tablet by mouth daily.    Marland Kitchen warfarin (COUMADIN) 5 MG tablet TAKE AS DIRECTED 30 tablet 3   No current facility-administered medications for this visit.    Family History  Problem Relation Age of Onset  . Hypertension Mother   . Heart disease Father   . COPD Father   . Diabetes Maternal Aunt   . Diabetes Maternal Grandfather   . Diabetes Maternal Aunt   . Diabetes Son   . Rheum arthritis Brother   . Kidney cancer Brother   . Breast cancer Maternal Grandmother     ROS:  Pertinent items are noted in HPI.  Otherwise, a comprehensive ROS was negative.  Exam:   BP 110/66 mmHg  Pulse 72  Ht 5\' 3"  (1.6 m)  Wt 157 lb (71.215 kg)  BMI 27.82 kg/m2  LMP 01/23/1988 (Approximate) Height: 5\' 3"  (160 cm) Ht Readings from Last 3 Encounters:  04/12/15 5\' 3"  (1.6 m)  06/25/14  5\' 4"  (1.626 m)  03/31/13 5' 3.5" (1.613 m)    General appearance: alert, cooperative and appears stated age Head: Normocephalic, without obvious abnormality, atraumatic Neck: no adenopathy, supple, symmetrical, trachea midline and thyroid normal to inspection and palpation Lungs: clear to auscultation bilaterally Breasts: normal appearance, no masses or tenderness Heart: regular rate and rhythm Abdomen: soft, non-tender; no masses,  no organomegaly Extremities: extremities normal, atraumatic, no cyanosis or edema Skin: Skin color, texture, turgor normal. No rashes or lesions Lymph nodes: Cervical, supraclavicular, and axillary nodes normal. No abnormal inguinal nodes palpated Neurologic: Grossly normal   Pelvic: External genitalia:  no lesions              Urethra:  normal appearing urethra with no masses, tenderness or lesions              Bartholin's and Skene's: normal                 Vagina: normal appearing vagina with normal color and discharge, no lesions              Cervix: anteverted              Pap taken: Yes.   Bimanual Exam:  Uterus:  normal size, contour, position, consistency, mobility, non-tender              Adnexa: no mass, fullness, tenderness               Rectovaginal: Confirms               Anus:  normal sphincter tone, no lesions  Chaperone present: yes  A:  Well Woman with normal exam  Postmenopausal HRT 1989 - 07/2000 History of A Fib on Coumadin, HTN, GERD, Hyperlipidemia S/P left breast ductal ectasia with atypia Tx with Tamoxifen 06/2002 Osteopenia with Vit D deficiency   P:   Reviewed health and wellness pertinent to exam  Pap smear as above  Mammogram is due 09/2015  IFOB is given  Refill on Evista for a year  Counseled on breast self exam, mammography screening, adequate intake of calcium and vitamin D, diet and exercise, Kegel's exercises return annually or prn  An After Visit Summary was printed and  given to the patient.

## 2015-04-12 NOTE — Patient Instructions (Signed)

## 2015-04-13 ENCOUNTER — Telehealth: Payer: Self-pay | Admitting: Nurse Practitioner

## 2015-04-13 LAB — IPS PAP SMEAR ONLY

## 2015-04-13 NOTE — Telephone Encounter (Signed)
Patient is given information about consult with Dr. Quincy Simmonds regarding BMD and Evista.  We feel that she does not need to be on Evista secondary to risk factors with A fib.  She will repeat BMD in 2 yrs and then decide if treatment was needed and if so would recommend Prolia secondary to bad side effects with Fosamax as she has experienced in the past. She is agreeable to recommendation and has not picked up RX - will DC order to pharmacy.

## 2015-04-20 NOTE — Telephone Encounter (Signed)
Please see refills encounter dated 03/31/2015.  Routing to provider for final review. Patient agreeable to disposition. Will close encounter.

## 2015-04-21 ENCOUNTER — Other Ambulatory Visit: Payer: Self-pay | Admitting: Interventional Cardiology

## 2015-05-12 ENCOUNTER — Ambulatory Visit (INDEPENDENT_AMBULATORY_CARE_PROVIDER_SITE_OTHER): Payer: Medicare Other | Admitting: *Deleted

## 2015-05-12 DIAGNOSIS — I4891 Unspecified atrial fibrillation: Secondary | ICD-10-CM | POA: Diagnosis not present

## 2015-05-12 LAB — POCT INR: INR: 2.5

## 2015-06-23 ENCOUNTER — Other Ambulatory Visit: Payer: Self-pay | Admitting: Interventional Cardiology

## 2015-06-23 ENCOUNTER — Ambulatory Visit (INDEPENDENT_AMBULATORY_CARE_PROVIDER_SITE_OTHER): Payer: Medicare Other

## 2015-06-23 DIAGNOSIS — I4891 Unspecified atrial fibrillation: Secondary | ICD-10-CM | POA: Diagnosis not present

## 2015-06-23 LAB — POCT INR: INR: 1.6

## 2015-07-07 NOTE — Progress Notes (Signed)
Patient ID: Anna Mathews, female   DOB: Aug 06, 1940, 75 y.o.   MRN: [id number]     Cardiology Office Note   Date:  07/08/2015   ID:  Anna Mathews, DOB Feb 21, 1940, MRN [id number]  PCP:  Anna Solo, MD    No chief complaint on file. AFib   Wt Readings from Last 3 Encounters:  07/08/15 155 lb 6.4 oz (70.489 kg)  04/12/15 157 lb (71.215 kg)  06/25/14 153 lb 1.9 oz (69.455 kg)       History of Present Illness: Anna Mathews is a 75 y.o. female  who has AFib. We tried to see if she would tolerate less flecainide. She felt short of breath after a couple of days and resumed her usual dose. Atrial Fibrillation F/U:  Denies : Chest pain.  Dizziness.  Orthopnea.  Palpitations.  Syncope.  More stable coumadin dose.  No bleeding problems.  Her husband died in 2015/06/14.  She has adjusted well. She will be moving.  She wants to downsize.    Mild DOE when walking up the stairs.  No problems with exercise- treadmill, yoga.      Past Medical History  Diagnosis Date  . Hyperlipidemia   . Hypertension   . GERD (gastroesophageal reflux disease)   . Hiatal hernia   . Osteopenia   . Diverticulosis 09/2006  . Atrial fibrillation ([place]) 09/2009    on coumadin  . Postmenopausal hormone replacement therapy 1989 - 07/2000    Past Surgical History  Procedure Laterality Date  . Breast surgery Left 4/99    breast biopsy - fibrosis  . Breast ductal system excision Left 06/2002    duct ectasia with fibrosis - atypical lobular hyperplasia with micro calcifications - tamoxifen X 28 months then change to Evista 10/06  . Hysteroscopy  4/95    w/D&C  . Cesarean section      X 2     Current Outpatient Prescriptions  Medication Sig Dispense Refill  . amLODipine (NORVASC) 5 MG tablet TAKE 1 TABLET BY MOUTH DAILY 30 tablet 3  . Calcium Carbonate (CALTRATE 600 PO) Take 1 capsule by mouth 2 (two) times daily. Chocolate chew    . CINNAMON PO Take 1 tablet by mouth daily.    . Coenzyme Q10  (COQ10 PO) Take 1 capsule by mouth daily.    . flecainide (TAMBOCOR) 100 MG tablet Take 1 tablet (100 mg total) by mouth 2 (two) times daily. 60 tablet 11  . GINKGO BILOBA PO Take 1 tablet by mouth daily.    . metoprolol succinate (TOPROL-XL) 50 MG 24 hr tablet Take 1 tablet by mouth daily.    . Omega-3 Fatty Acids (OMEGA-3 PO) Take 3 tablets by mouth daily.     . quinapril (ACCUPRIL) 40 MG tablet Take 40 mg by mouth at bedtime.    . simvastatin (ZOCOR) 20 MG tablet Take 1 tablet by mouth daily.    Marland Kitchen warfarin (COUMADIN) 5 MG tablet TAKE AS DIRECTED 30 tablet 3  . tretinoin (RETIN-A) 0.1 % cream APPLY PEA SIZED AMOUNT TO FACE AT BEDTIME  AS DIRECTED  3   No current facility-administered medications for this visit.    Allergies:   Penicillins and Sulfa antibiotics    Social History:  The patient  reports that she quit smoking about 47 years ago. Her smoking use included Cigarettes. She started smoking about 58 years ago. She has a 3 pack-year smoking history. She has never used smokeless tobacco. She reports  that she drinks alcohol. She reports that she does not use illicit drugs.   Family History:  [name] *family history includes Breast cancer in her maternal grandmother; COPD in her father; Diabetes in her maternal aunt, maternal aunt, maternal grandfather, and son; Heart disease in her father; Hypertension in her mother; Kidney cancer in her brother; Rheum arthritis in her brother.    ROS:  Please see the history of present illness.   Otherwise, review of systems are positive for occasional fatigue, DOE going up stairs.   All other systems are reviewed and negative.    PHYSICAL EXAM: VS:  BP 122/58 mmHg  Pulse 52  Ht 5\' 4"  (1.626 m)  Wt 155 lb 6.4 oz (70.489 kg)  BMI 26.66 kg/m2  SpO2 99%  LMP 01/23/1988 (Approximate) , BMI Body mass index is 26.66 kg/(m^2). GEN: Well nourished, well developed, in no acute distress HEENT: normal Neck: no JVD, carotid bruits, or  masses Cardiac: bradycardic; 2/6 systolic murmur, rubs, or gallops,no edema  Respiratory:  clear to auscultation bilaterally, normal work of breathing GI: soft, nontender, nondistended, + BS MS: no deformity or atrophy Skin: warm and dry, no rash Neuro:  Strength and sensation are intact Psych: euthymic mood, full affect   EKG:   The ekg ordered today demonstrates SB, no ST segment changes   Recent Labs: No results found for requested labs within last 365 days.   Lipid Panel No results found for: CHOL, TRIG, HDL, CHOLHDL, VLDL, LDLCALC, LDLDIRECT   Other studies Reviewed: Additional studies/ records that were reviewed today with results demonstrating: ETT in 2011- nondiagnostic.   ASSESSMENT AND PLAN:  Atrial fibrillation  Continue Flecainide Acetate Tablet, 100 MG Not Specified, 1 tablet, Orally (new rx as of 10/31/11), twice a day Notes: maintaining sinus rhythm. Some bradycardia.  TOld her sx of bradycardia to watch for.  No feeling like she would pass out.     2.  long-term (current) use of anticoagulants  Notes: INR to be checked and Coumadin dose to be adjusted with Pharm D. Please see that note for details.    3. Essential hypertension, benign  Continue Amlodipine Besylate Tablet, 5 MG Tablet, 1 tablet, Orally, Once a day Continue Metoprolol Succinate Tablet Extended Release 24 Hour, 50 MG Tablet, 1/2 tablet, Orally, Once a day Continue Accupril Tablet, 40 MG, 1 tablet, Orally, Once a day Notes: Controlled at home.          4.  Has handled her husband's passing well, as she has a lot of support.  Aortic sclerosis by 2011 echo.  Current medicines are reviewed at length with the patient today.  The patient concerns regarding her medicines were addressed.  The following changes have been made:  No change  Labs/ tests ordered today include: obtain lab results from Dr. Sabra Heck. No orders of the defined types were placed in this encounter.    Recommend 150  minutes/week of aerobic exercise Low fat, low carb, high fiber diet recommended  Disposition:   FU in 1 year   Signed, Larae Grooms, MD  07/08/2015 2:00 PM    [address], [place], [place]  [address] Phone: [phone]; Fax: [phone]

## 2015-07-08 ENCOUNTER — Encounter: Payer: Self-pay | Admitting: Interventional Cardiology

## 2015-07-08 ENCOUNTER — Ambulatory Visit (INDEPENDENT_AMBULATORY_CARE_PROVIDER_SITE_OTHER): Payer: Medicare Other | Admitting: Interventional Cardiology

## 2015-07-08 ENCOUNTER — Ambulatory Visit (INDEPENDENT_AMBULATORY_CARE_PROVIDER_SITE_OTHER): Payer: Medicare Other | Admitting: *Deleted

## 2015-07-08 VITALS — BP 122/58 | HR 52 | Ht 64.0 in | Wt 155.4 lb

## 2015-07-08 DIAGNOSIS — I10 Essential (primary) hypertension: Secondary | ICD-10-CM | POA: Diagnosis not present

## 2015-07-08 DIAGNOSIS — I4891 Unspecified atrial fibrillation: Secondary | ICD-10-CM

## 2015-07-08 DIAGNOSIS — Z7901 Long term (current) use of anticoagulants: Secondary | ICD-10-CM | POA: Insufficient documentation

## 2015-07-08 LAB — POCT INR: INR: 2

## 2015-07-08 NOTE — Patient Instructions (Signed)

## 2015-07-22 ENCOUNTER — Ambulatory Visit (INDEPENDENT_AMBULATORY_CARE_PROVIDER_SITE_OTHER): Payer: Medicare Other | Admitting: *Deleted

## 2015-07-22 DIAGNOSIS — I4891 Unspecified atrial fibrillation: Secondary | ICD-10-CM

## 2015-07-22 LAB — POCT INR: INR: 3

## 2015-07-24 ENCOUNTER — Other Ambulatory Visit: Payer: Self-pay | Admitting: Interventional Cardiology

## 2015-08-02 ENCOUNTER — Other Ambulatory Visit: Payer: Self-pay | Admitting: *Deleted

## 2015-08-02 MED ORDER — METOPROLOL SUCCINATE ER 50 MG PO TB24
50.0000 mg | ORAL_TABLET | Freq: Every day | ORAL | Status: DC
Start: 2015-08-02 — End: 2016-07-22

## 2015-08-12 ENCOUNTER — Ambulatory Visit (INDEPENDENT_AMBULATORY_CARE_PROVIDER_SITE_OTHER): Payer: Medicare Other | Admitting: Pharmacist

## 2015-08-12 ENCOUNTER — Encounter (INDEPENDENT_AMBULATORY_CARE_PROVIDER_SITE_OTHER): Payer: Self-pay

## 2015-08-12 DIAGNOSIS — I4891 Unspecified atrial fibrillation: Secondary | ICD-10-CM | POA: Diagnosis not present

## 2015-08-12 LAB — POCT INR: INR: 2.5

## 2015-09-08 ENCOUNTER — Other Ambulatory Visit: Payer: Self-pay | Admitting: Interventional Cardiology

## 2015-09-09 ENCOUNTER — Ambulatory Visit (INDEPENDENT_AMBULATORY_CARE_PROVIDER_SITE_OTHER): Payer: Medicare Other | Admitting: Pharmacist

## 2015-09-09 DIAGNOSIS — I4891 Unspecified atrial fibrillation: Secondary | ICD-10-CM | POA: Diagnosis not present

## 2015-09-09 LAB — POCT INR: INR: 2.7

## 2015-10-14 ENCOUNTER — Ambulatory Visit (INDEPENDENT_AMBULATORY_CARE_PROVIDER_SITE_OTHER): Payer: Medicare Other | Admitting: *Deleted

## 2015-10-14 DIAGNOSIS — I4891 Unspecified atrial fibrillation: Secondary | ICD-10-CM

## 2015-10-14 LAB — POCT INR: INR: 3.5

## 2015-10-20 ENCOUNTER — Other Ambulatory Visit: Payer: Self-pay | Admitting: Interventional Cardiology

## 2015-10-28 ENCOUNTER — Ambulatory Visit (INDEPENDENT_AMBULATORY_CARE_PROVIDER_SITE_OTHER): Payer: Medicare Other

## 2015-10-28 DIAGNOSIS — I4891 Unspecified atrial fibrillation: Secondary | ICD-10-CM

## 2015-10-28 LAB — POCT INR: INR: 3

## 2015-11-25 ENCOUNTER — Ambulatory Visit (INDEPENDENT_AMBULATORY_CARE_PROVIDER_SITE_OTHER): Payer: Medicare Other | Admitting: Pharmacist

## 2015-11-25 DIAGNOSIS — I4891 Unspecified atrial fibrillation: Secondary | ICD-10-CM | POA: Diagnosis not present

## 2015-11-25 LAB — POCT INR: INR: 4

## 2015-12-09 ENCOUNTER — Ambulatory Visit (INDEPENDENT_AMBULATORY_CARE_PROVIDER_SITE_OTHER): Payer: Medicare Other | Admitting: Pharmacist Clinician (PhC)/ Clinical Pharmacy Specialist

## 2015-12-09 DIAGNOSIS — I4891 Unspecified atrial fibrillation: Secondary | ICD-10-CM | POA: Diagnosis not present

## 2015-12-09 LAB — POCT INR: INR: 2

## 2015-12-26 ENCOUNTER — Ambulatory Visit (INDEPENDENT_AMBULATORY_CARE_PROVIDER_SITE_OTHER): Payer: Medicare Other

## 2015-12-26 DIAGNOSIS — I4891 Unspecified atrial fibrillation: Secondary | ICD-10-CM | POA: Diagnosis not present

## 2015-12-26 LAB — POCT INR: INR: 3.2

## 2016-01-13 ENCOUNTER — Ambulatory Visit (INDEPENDENT_AMBULATORY_CARE_PROVIDER_SITE_OTHER): Payer: Medicare Other | Admitting: *Deleted

## 2016-01-13 DIAGNOSIS — I4891 Unspecified atrial fibrillation: Secondary | ICD-10-CM

## 2016-01-13 LAB — POCT INR: INR: 2.6

## 2016-02-03 ENCOUNTER — Ambulatory Visit (INDEPENDENT_AMBULATORY_CARE_PROVIDER_SITE_OTHER): Payer: Medicare Other

## 2016-02-03 DIAGNOSIS — I4891 Unspecified atrial fibrillation: Secondary | ICD-10-CM

## 2016-02-03 LAB — POCT INR: INR: 3.6

## 2016-02-17 ENCOUNTER — Ambulatory Visit (INDEPENDENT_AMBULATORY_CARE_PROVIDER_SITE_OTHER): Payer: Medicare Other

## 2016-02-17 DIAGNOSIS — I4891 Unspecified atrial fibrillation: Secondary | ICD-10-CM

## 2016-02-17 LAB — POCT INR: INR: 2.2

## 2016-02-24 ENCOUNTER — Other Ambulatory Visit: Payer: Self-pay | Admitting: Interventional Cardiology

## 2016-03-09 ENCOUNTER — Ambulatory Visit (INDEPENDENT_AMBULATORY_CARE_PROVIDER_SITE_OTHER): Payer: Medicare Other | Admitting: *Deleted

## 2016-03-09 DIAGNOSIS — I4891 Unspecified atrial fibrillation: Secondary | ICD-10-CM

## 2016-03-09 LAB — POCT INR: INR: 2.4

## 2016-03-12 ENCOUNTER — Ambulatory Visit (HOSPITAL_COMMUNITY)
Admission: RE | Admit: 2016-03-12 | Discharge: 2016-03-12 | Disposition: A | Discharge status: Home / Self Care | Payer: Medicare Other | Source: Ambulatory Visit | Attending: Family Medicine | Admitting: Family Medicine

## 2016-04-05 ENCOUNTER — Telehealth: Payer: Self-pay | Admitting: Pharmacist

## 2016-04-05 NOTE — Telephone Encounter (Signed)
Patient called with a Coumadin dosing question. She was prescribed metronidazole 500 mg x 10 days starting Monday, 3/12. Her INR was 2.5 that day. She took 2.5 mg of warfarin on Monday, Tuesday, and Wednesday. Her INR was 2.8 on Wednesday. She called to ask what dose to take today.   We recommended that she take 2.5 mg today (Thursday). She has a Coumadin check tomorrow (Friday) at 11:45.

## 2016-04-06 ENCOUNTER — Ambulatory Visit (INDEPENDENT_AMBULATORY_CARE_PROVIDER_SITE_OTHER): Payer: Medicare Other | Admitting: *Deleted

## 2016-04-06 DIAGNOSIS — I4891 Unspecified atrial fibrillation: Secondary | ICD-10-CM

## 2016-04-06 LAB — POCT INR: INR: 2.5

## 2016-04-10 LAB — POCT INR: INR: 2.9

## 2016-04-11 ENCOUNTER — Ambulatory Visit (INDEPENDENT_AMBULATORY_CARE_PROVIDER_SITE_OTHER): Payer: Medicare Other | Admitting: Cardiovascular Disease

## 2016-04-11 DIAGNOSIS — I4891 Unspecified atrial fibrillation: Secondary | ICD-10-CM

## 2016-04-16 ENCOUNTER — Other Ambulatory Visit: Payer: Self-pay | Admitting: Family Medicine

## 2016-04-16 DIAGNOSIS — K5792 Diverticulitis of intestine, part unspecified, without perforation or abscess without bleeding: Secondary | ICD-10-CM

## 2016-04-17 ENCOUNTER — Ambulatory Visit: Payer: Medicare Other | Admitting: Nurse Practitioner

## 2016-04-20 ENCOUNTER — Ambulatory Visit (INDEPENDENT_AMBULATORY_CARE_PROVIDER_SITE_OTHER): Payer: Medicare Other | Admitting: *Deleted

## 2016-04-20 ENCOUNTER — Ambulatory Visit
Admission: RE | Admit: 2016-04-20 | Discharge: 2016-04-20 | Disposition: A | Discharge status: Home / Self Care | Payer: Medicare Other | Source: Ambulatory Visit | Attending: Family Medicine | Admitting: Family Medicine

## 2016-04-20 DIAGNOSIS — K5792 Diverticulitis of intestine, part unspecified, without perforation or abscess without bleeding: Secondary | ICD-10-CM

## 2016-04-20 DIAGNOSIS — I4891 Unspecified atrial fibrillation: Secondary | ICD-10-CM

## 2016-04-20 LAB — POCT INR: INR: 2.2

## 2016-04-20 MED ORDER — IOPAMIDOL (ISOVUE-300) INJECTION 61%
100.0000 mL | Freq: Once | INTRAVENOUS | Status: AC | PRN
  Administered 2016-04-20: 100 mL via INTRAVENOUS

## 2016-05-08 ENCOUNTER — Ambulatory Visit: Payer: Medicare Other | Admitting: Nurse Practitioner

## 2016-05-14 ENCOUNTER — Encounter: Payer: Self-pay | Admitting: Nurse Practitioner

## 2016-05-14 ENCOUNTER — Ambulatory Visit (INDEPENDENT_AMBULATORY_CARE_PROVIDER_SITE_OTHER): Payer: Medicare Other | Admitting: *Deleted

## 2016-05-14 ENCOUNTER — Ambulatory Visit (INDEPENDENT_AMBULATORY_CARE_PROVIDER_SITE_OTHER): Payer: Medicare Other | Admitting: Nurse Practitioner

## 2016-05-14 VITALS — BP 136/80 | HR 64 | Ht 63.5 in | Wt 160.0 lb

## 2016-05-14 DIAGNOSIS — I482 Chronic atrial fibrillation, unspecified: Secondary | ICD-10-CM

## 2016-05-14 DIAGNOSIS — I10 Essential (primary) hypertension: Secondary | ICD-10-CM | POA: Diagnosis not present

## 2016-05-14 DIAGNOSIS — I4891 Unspecified atrial fibrillation: Secondary | ICD-10-CM | POA: Diagnosis not present

## 2016-05-14 DIAGNOSIS — Z0000 Encounter for general adult medical examination without abnormal findings: Secondary | ICD-10-CM

## 2016-05-14 DIAGNOSIS — Z01411 Encounter for gynecological examination (general) (routine) with abnormal findings: Secondary | ICD-10-CM | POA: Diagnosis not present

## 2016-05-14 LAB — POCT INR: INR: 2.2

## 2016-05-14 NOTE — Progress Notes (Signed)
Encounter reviewed by Dr. Brook Amundson C. Silva.  

## 2016-05-14 NOTE — Progress Notes (Signed)
Patient ID: Anna Mathews, female   DOB: 1941/01/11, 76 y.o.   MRN: [id number]  75 y.o. [id number] Widowed. Caucasian Fe here for annual exam.  Recent flare of diverticulitis and took last antibiotic about 10 days ago.  CT of abdomen was otherwise normal.  She has a history of A Fib and today felt that pulse was slight irregular.  She was frustrated because she could not find the office.  Husband passed last May with multiple organ failure.  He had CHF at 18.  Now has moved to  Condo about 2 blocks from her daughter and other friends live there.  Patient's last menstrual period was 01/23/1988 (approximate).          Sexually active: No.  The current method of family planning is abstinence and post menopausal status.    Exercising: Yes.    Gym/ health club routine includes yoga. Smoker:  no  Health Maintenance: Pap: 04/12/15, Negative with neg HR HPV  11/05/08, Negative History of Abnormal Pap: no MMG: 10/2015 with ultrasound, report requested 05/14/16 Self Breast exams: yes Colonoscopy: 2009, repeat in 10 years BMD: 10/04/14 T Score: -1.0 Spine / -1.0 Right Femur Total / -0.7 Left Femur Total TDaP: 08/16/09, Td: 03/12/14 Shingles: 04/30/06 Pneumonia: 11/17/13 Prevnar-13, 01/08/05 Pneumovax Hep C and HIV: Not indicated due to age Labs: PCP takes care of all labs   reports that she quit smoking about 48 years ago. Her smoking use included Cigarettes. She started smoking about 59 years ago. She has a 3.00 pack-year smoking history. She has never used smokeless tobacco. She reports that she drinks alcohol. She reports that she does not use drugs.  Past Medical History:  Diagnosis Date  . Atrial fibrillation ([place]) 09/2009   on coumadin  . Diverticulosis 09/2006  . GERD (gastroesophageal reflux disease)   . Hiatal hernia   . Hyperlipidemia   . Hypertension   . Osteopenia   . Postmenopausal hormone replacement therapy 1989 - 07/2000    Past Surgical History:  Procedure Laterality Date  . BREAST  DUCTAL SYSTEM EXCISION Left 06/2002   duct ectasia with fibrosis - atypical lobular hyperplasia with micro calcifications - tamoxifen X 28 months then change to Evista 10/06  . BREAST SURGERY Left 4/99   breast biopsy - fibrosis  . CESAREAN SECTION     X 2  . HYSTEROSCOPY  4/95   w/D&C    Current Outpatient Prescriptions  Medication Sig Dispense Refill  . amLODipine (NORVASC) 5 MG tablet TAKE 1 TABLET BY MOUTH DAILY 30 tablet 9  . aspirin 81 MG chewable tablet Chew 81 mg by mouth daily.    . Calcium Carbonate (CALTRATE 600 PO) Take 1 capsule by mouth 2 (two) times daily. Chocolate chew    . flecainide (TAMBOCOR) 100 MG tablet TAKE 1 TABLET (100 MG TOTAL) BY MOUTH 2 (TWO) TIMES DAILY. 60 tablet 11  . metoprolol succinate (TOPROL-XL) 50 MG 24 hr tablet Take 1 tablet (50 mg total) by mouth daily. 90 tablet 3  . Omega-3 Fatty Acids (OMEGA-3 PO) Take 3 tablets by mouth daily.     . quinapril (ACCUPRIL) 40 MG tablet Take 40 mg by mouth at bedtime.    . simvastatin (ZOCOR) 20 MG tablet Take 1 tablet by mouth daily.    Marland Kitchen warfarin (COUMADIN) 5 MG tablet TAKE AS DIRECTED BY PRESCRIBER 30 tablet 3   No current facility-administered medications for this visit.     Family History  Problem Relation Age of Onset  .  Hypertension Mother   . Heart disease Father   . COPD Father   . Diabetes Maternal Grandfather   . Diabetes Son   . Rheum arthritis Brother   . Kidney cancer Brother   . Breast cancer Maternal Grandmother   . Diabetes Maternal Aunt   . Diabetes Maternal Aunt     ROS:  Pertinent items are noted in HPI.  Otherwise, a comprehensive ROS was negative.  Exam:   BP 136/80 (BP Location: Right Arm, Patient Position: Sitting, Cuff Size: Normal)   Pulse 64   Ht 5' 3.5" (1.613 m)   Wt 160 lb (72.6 kg)   LMP 01/23/1988 (Approximate)   BMI 27.90 kg/m  Height: 5' 3.5" (161.3 cm) Ht Readings from Last 3 Encounters:  05/14/16 5' 3.5" (1.613 m)  07/08/15 5\' 4"  (1.626 m)  04/12/15 5\' 3"   (1.6 m)    General appearance: alert, cooperative and appears stated age Head: Normocephalic, without obvious abnormality, atraumatic Neck: no adenopathy, supple, symmetrical, trachea midline and thyroid normal to inspection and palpation Lungs: clear to auscultation bilaterally Breasts: normal appearance, no masses or tenderness Heart: irregular rate and rhythm due to A Fib.  About 2-3 per minute A Fib is noted. Abdomen: soft, non-tender; no masses,  no organomegaly Extremities: extremities normal, atraumatic, no cyanosis or edema Skin: Skin color, texture, turgor normal. No rashes or lesions Lymph nodes: Cervical, supraclavicular, and axillary nodes normal. No abnormal inguinal nodes palpated Neurologic: Grossly normal   Pelvic: External genitalia:  no lesions except for an inclusion cyst right mid labia - some was expressed.              Urethra:  normal appearing urethra with no masses, tenderness or lesions              Bartholin's and Skene's: normal                 Vagina: normal appearing vagina with normal color and discharge, no lesions              Cervix: anteverted              Pap taken: No. Bimanual Exam:  Uterus:  normal size, contour, position, consistency, mobility, non-tender              Adnexa: no mass, fullness, tenderness               Rectovaginal: Confirms               Anus:  normal sphincter tone, no lesions  Chaperone present: yes  A:  Well Woman with normal exam  Postmenopausal HRT 1989 - 07/2000 History of A Fib on Coumadin, HTN, GERD, Hyperlipidemia S/P left breast ductal ectasia with atypia Tx with Tamoxifen 06/2002 Osteopenia with Vit D deficiency  Recent flare of diverticulitis and completed antibiotics 10 days ago  P:   Reviewed health and wellness pertinent to exam  Pap smear: no  Mammogram is due 10/2016 -will get copy of previous  She will call PCP to evaluate current A Fib  Counseled on breast self exam,  mammography screening, adequate intake of calcium and vitamin D, diet and exercise, Kegel's exercises return annually or prn  An After Visit Summary was printed and given to the patient.

## 2016-05-14 NOTE — Patient Instructions (Addendum)
General topics  Next pap or exam is  due in 1 year Take a Women's multivitamin Take 1200 mg. of calcium daily - prefer dietary If any concerns in interim to call back  Breast Self-Awareness Practicing breast self-awareness may pick up problems early, prevent significant medical complications, and possibly save your life. By practicing breast self-awareness, you can become familiar with how your breasts look and feel and if your breasts are changing. This allows you to notice changes early. It can also offer you some reassurance that your breast health is good. One way to learn what is normal for your breasts and whether your breasts are changing is to do a breast self-exam. If you find a lump or something that was not present in the past, it is best to contact your caregiver right away. Other findings that should be evaluated by your caregiver include nipple discharge, especially if it is bloody; skin changes or reddening; areas where the skin seems to be pulled in (retracted); or new lumps and bumps. Breast pain is seldom associated with cancer (malignancy), but should also be evaluated by a caregiver. BREAST SELF-EXAM The best time to examine your breasts is 5 7 days after your menstrual period is over.  ExitCare Patient Information 2013 [employer].   Exercise to Stay Healthy Exercise helps you become and stay healthy. EXERCISE IDEAS AND TIPS Choose exercises that:  You enjoy.  Fit into your day. You do not need to exercise really hard to be healthy. You can do exercises at a slow or medium level and stay healthy. You can:  Stretch before and after working out.  Try yoga, Pilates, or tai chi.  Lift weights.  Walk fast, swim, jog, run, climb stairs, bicycle, dance, or rollerskate.  Take aerobic classes. Exercises that burn about 150 calories:  Running 1  miles in 15 minutes.  Playing volleyball for 45 to 60 minutes.  Washing and waxing a car for 45 to 60  minutes.  Playing touch football for 45 minutes.  Walking 1  miles in 35 minutes.  Pushing a stroller 1  miles in 30 minutes.  Playing basketball for 30 minutes.  Raking leaves for 30 minutes.  Bicycling 5 miles in 30 minutes.  Walking 2 miles in 30 minutes.  Dancing for 30 minutes.  Shoveling snow for 15 minutes.  Swimming laps for 20 minutes.  Walking up stairs for 15 minutes.  Bicycling 4 miles in 15 minutes.  Gardening for 30 to 45 minutes.  Jumping rope for 15 minutes.  Washing windows or floors for 45 to 60 minutes. Document Released: 02/10/2010 Document Revised: 04/02/2011 Document Reviewed: 02/10/2010 Exit[employer] Patient Information 2013 [employer].   Other topics ( that may be useful information):    Sexually Transmitted Disease Sexually transmitted disease (STD) refers to any infection that is passed from person to person during sexual activity. This may happen by way of saliva, semen, blood, vaginal mucus, or urine. Common STDs include:  Gonorrhea.  Chlamydia.  Syphilis.  HIV/AIDS.  Genital herpes.  Hepatitis B and C.  Trichomonas.  Human papillomavirus (HPV).  Pubic lice. CAUSES  An STD may be spread by bacteria, virus, or parasite. A person can get an STD by:  Sexual intercourse with an infected person.  Sharing sex toys with an infected person.  Sharing needles with an infected person.  Having intimate contact with the genitals, mouth, or rectal areas of an infected person. SYMPTOMS  Some people may not have any symptoms, but   they can still pass the infection to others. Different STDs have different symptoms. Symptoms include:  Painful or bloody urination.  Pain in the pelvis, abdomen, vagina, anus, throat, or eyes.  Skin rash, itching, irritation, growths, or sores (lesions). These usually occur in the genital or anal area.  Abnormal vaginal discharge.  Penile discharge in men.  Soft, flesh-colored skin growths in the  genital or anal area.  Fever.  Pain or bleeding during sexual intercourse.  Swollen glands in the groin area.  Yellow skin and eyes (jaundice). This is seen with hepatitis. DIAGNOSIS  To make a diagnosis, your caregiver may:  Take a medical history.  Perform a physical exam.  Take a specimen (culture) to be examined.  Examine a sample of discharge under a microscope.  Perform blood test TREATMENT   Chlamydia, gonorrhea, trichomonas, and syphilis can be cured with antibiotic medicine.  Genital herpes, hepatitis, and HIV can be treated, but not cured, with prescribed medicines. The medicines will lessen the symptoms.  Genital warts from HPV can be treated with medicine or by freezing, burning (electrocautery), or surgery. Warts may come back.  HPV is a virus and cannot be cured with medicine or surgery.However, abnormal areas may be followed very closely by your caregiver and may be removed from the cervix, vagina, or vulva through office procedures or surgery. If your diagnosis is confirmed, your recent sexual partners need treatment. This is true even if they are symptom-free or have a negative culture or evaluation. They should not have sex until their caregiver says it is okay. HOME CARE INSTRUCTIONS  All sexual partners should be informed, tested, and treated for all STDs.  Take your antibiotics as directed. Finish them even if you start to feel better.  Only take over-the-counter or prescription medicines for pain, discomfort, or fever as directed by your caregiver.  Rest.  Eat a balanced diet and drink enough fluids to keep your urine clear or pale yellow.  Do not have sex until treatment is completed and you have followed up with your caregiver. STDs should be checked after treatment.  Keep all follow-up appointments, Pap tests, and blood tests as directed by your caregiver.  Only use latex condoms and water-soluble lubricants during sexual activity. Do not use  petroleum jelly or oils.  Avoid alcohol and illegal drugs.  Get vaccinated for HPV and hepatitis. If you have not received these vaccines in the past, talk to your caregiver about whether one or both might be right for you.  Avoid risky sex practices that can break the skin. The only way to avoid getting an STD is to avoid all sexual activity.Latex condoms and dental dams (for oral sex) will help lessen the risk of getting an STD, but will not completely eliminate the risk. SEEK MEDICAL CARE IF:   You have a fever.  You have any new or worsening symptoms. Document Released: 03/31/2002 Document Revised: 04/02/2011 Document Reviewed: 04/07/2010 [facility] Patient Information 2013 [place].    Domestic Abuse You are being battered or abused if someone close to you hits, pushes, or physically hurts you in any way. You also are being abused if you are forced into activities. You are being sexually abused if you are forced to have sexual contact of any kind. You are being emotionally abused if you are made to feel worthless or if you are constantly threatened. It is important to remember that help is available. No one has the right to abuse you. PREVENTION OF FURTHER  ABUSE  Learn the warning signs of danger. This varies with situations but may include: the use of alcohol, threats, isolation from friends and family, or forced sexual contact. Leave if you feel that violence is going to occur.  If you are attacked or beaten, report it to the police so the abuse is documented. You do not have to press charges. The police can protect you while you or the attackers are leaving. Get the officer's name and badge number and a copy of the report.  Find someone you can trust and tell them what is happening to you: your caregiver, a nurse, clergy member, close friend or family member. Feeling ashamed is natural, but remember that you have done nothing wrong. No one deserves abuse. Document Released:  01/06/2000 Document Revised: 04/02/2011 Document Reviewed: 03/16/2010 Exit[employer] Patient Information 2013 [employer].    How Much is Too Much Alcohol? Drinking too much alcohol can cause injury, accidents, and health problems. These types of problems can include:   Car crashes.  Falls.  Family fighting (domestic violence).  Drowning.  Fights.  Injuries.  Burns.  Damage to certain organs.  Having a baby with birth defects. ONE DRINK CAN BE TOO MUCH WHEN YOU ARE:  Working.  Pregnant or breastfeeding.  Taking medicines. Ask your doctor.  Driving or planning to drive. If you or someone you know has a drinking problem, get help from a doctor.  Document Released: 11/04/2008 Document Revised: 04/02/2011 Document Reviewed: 11/04/2008 Exit[employer] Patient Information 2013 [employer].   Smoking Hazards Smoking cigarettes is extremely bad for your health. Tobacco smoke has over 200 known poisons in it. There are over 60 chemicals in tobacco smoke that cause cancer. Some of the chemicals found in cigarette smoke include:   Cyanide.  Benzene.  Formaldehyde.  Methanol (wood alcohol).  Acetylene (fuel used in welding torches).  Ammonia. Cigarette smoke also contains the poisonous gases nitrogen oxide and carbon monoxide.  Cigarette smokers have an increased risk of many serious medical problems and Smoking causes approximately:  90% of all lung cancer deaths in men.  80% of all lung cancer deaths in women.  90% of deaths from chronic obstructive lung disease. Compared with nonsmokers, smoking increases the risk of:  Coronary heart disease by 2 to 4 times.  Stroke by 2 to 4 times.  Men developing lung cancer by 23 times.  Women developing lung cancer by 13 times.  Dying from chronic obstructive lung diseases by 12 times.  . Smoking is the most preventable cause of death and disease in our society.  WHY IS SMOKING ADDICTIVE?  Nicotine is the chemical  agent in tobacco that is capable of causing addiction or dependence.  When you smoke and inhale, nicotine is absorbed rapidly into the bloodstream through your lungs. Nicotine absorbed through the lungs is capable of creating a powerful addiction. Both inhaled and non-inhaled nicotine may be addictive.  Addiction studies of cigarettes and spit tobacco show that addiction to nicotine occurs mainly during the teen years, when young people begin using tobacco products. WHAT ARE THE BENEFITS OF QUITTING?  There are many health benefits to quitting smoking.   Likelihood of developing cancer and heart disease decreases. Health improvements are seen almost immediately.  Blood pressure, pulse rate, and breathing patterns start returning to normal soon after quitting. QUITTING SMOKING   American Lung Association - 1-800-LUNGUSA  American Cancer Society - 1-800-ACS-2345 Document Released: 02/16/2004 Document Revised: 04/02/2011 Document Reviewed: 10/20/2008 ExitCare Patient Information 2013 [employer].   Stress Management Stress is a state of physical or mental tension that often results from changes in your life or normal routine. Some common causes of stress are:  Death of a loved one.  Injuries or severe illnesses.  Getting fired or changing jobs.  Moving into a new home. Other causes may be:  Sexual problems.  Business or financial losses.  Taking on a large debt.  Regular conflict with someone at home or at work.  Constant tiredness from lack of sleep. It is not just bad things that are stressful. It may be stressful to:  Win the lottery.  Get married.  Buy a new car. The amount of stress that can be easily tolerated varies from person to person. Changes generally cause stress, regardless of the types of change. Too much stress can affect your health. It may lead to physical or emotional problems. Too little stress (boredom) may also become stressful. SUGGESTIONS TO  REDUCE STRESS:  Talk things over with your family and friends. It often is helpful to share your concerns and worries. If you feel your problem is serious, you may want to get help from a professional counselor.  Consider your problems one at a time instead of lumping them all together. Trying to take care of everything at once may seem impossible. List all the things you need to do and then start with the most important one. Set a goal to accomplish 2 or 3 things each day. If you expect to do too many in a single day you will naturally fail, causing you to feel even more stressed.  Do not use alcohol or drugs to relieve stress. Although you may feel better for a short time, they do not remove the problems that caused the stress. They can also be habit forming.  Exercise regularly - at least 3 times per week. Physical exercise can help to relieve that "uptight" feeling and will relax you.  The shortest distance between despair and hope is often a good night's sleep.  Go to bed and get up on time allowing yourself time for appointments without being rushed.  Take a short "time-out" period from any stressful situation that occurs during the day. Close your eyes and take some deep breaths. Starting with the muscles in your face, tense them, hold it for a few seconds, then relax. Repeat this with the muscles in your neck, shoulders, hand, stomach, back and legs.  Take good care of yourself. Eat a balanced diet and get plenty of rest.  Schedule time for having fun. Take a break from your daily routine to relax. HOME CARE INSTRUCTIONS   Call if you feel overwhelmed by your problems and feel you can no longer manage them on your own.  Return immediately if you feel like hurting yourself or someone else. Document Released: 07/04/2000 Document Revised: 04/02/2011 Document Reviewed: 02/24/2007 [facility] Patient Information 2013 [place].   Get IFOB -stool kit for blood from Dr. Jacelyn Grip.  Ask  about new Shingles vaccine.

## 2016-05-30 ENCOUNTER — Emergency Department (HOSPITAL_COMMUNITY): Payer: Medicare Other

## 2016-05-30 ENCOUNTER — Emergency Department (HOSPITAL_COMMUNITY)
Admission: EM | Admit: 2016-05-30 | Discharge: 2016-05-31 | Disposition: A | Discharge status: Home / Self Care | Payer: Medicare Other | Attending: Emergency Medicine | Admitting: Emergency Medicine

## 2016-05-30 ENCOUNTER — Encounter (HOSPITAL_COMMUNITY): Payer: Self-pay | Admitting: *Deleted

## 2016-05-30 DIAGNOSIS — Z87891 Personal history of nicotine dependence: Secondary | ICD-10-CM | POA: Insufficient documentation

## 2016-05-30 DIAGNOSIS — Y999 Unspecified external cause status: Secondary | ICD-10-CM | POA: Diagnosis not present

## 2016-05-30 DIAGNOSIS — S43004A Unspecified dislocation of right shoulder joint, initial encounter: Secondary | ICD-10-CM | POA: Insufficient documentation

## 2016-05-30 DIAGNOSIS — Z7901 Long term (current) use of anticoagulants: Secondary | ICD-10-CM | POA: Diagnosis not present

## 2016-05-30 DIAGNOSIS — S0003XA Contusion of scalp, initial encounter: Secondary | ICD-10-CM | POA: Diagnosis not present

## 2016-05-30 DIAGNOSIS — S0990XA Unspecified injury of head, initial encounter: Secondary | ICD-10-CM | POA: Diagnosis present

## 2016-05-30 DIAGNOSIS — I10 Essential (primary) hypertension: Secondary | ICD-10-CM | POA: Insufficient documentation

## 2016-05-30 DIAGNOSIS — Z7982 Long term (current) use of aspirin: Secondary | ICD-10-CM | POA: Insufficient documentation

## 2016-05-30 DIAGNOSIS — W01198A Fall on same level from slipping, tripping and stumbling with subsequent striking against other object, initial encounter: Secondary | ICD-10-CM | POA: Diagnosis not present

## 2016-05-30 DIAGNOSIS — Y9289 Other specified places as the place of occurrence of the external cause: Secondary | ICD-10-CM | POA: Diagnosis not present

## 2016-05-30 DIAGNOSIS — Y9301 Activity, walking, marching and hiking: Secondary | ICD-10-CM | POA: Diagnosis not present

## 2016-05-30 DIAGNOSIS — W19XXXA Unspecified fall, initial encounter: Secondary | ICD-10-CM

## 2016-05-30 MED ORDER — FENTANYL CITRATE (PF) 100 MCG/2ML IJ SOLN
INTRAMUSCULAR | Status: AC
  Filled 2016-05-30: qty 2

## 2016-05-30 MED ORDER — ETOMIDATE 2 MG/ML IV SOLN
7.0000 mg | Freq: Once | INTRAVENOUS | Status: AC
  Administered 2016-05-30: 7 mg via INTRAVENOUS

## 2016-05-30 MED ORDER — SODIUM CHLORIDE 0.9 % IV SOLN
Freq: Once | INTRAVENOUS | Status: AC
  Administered 2016-05-30: 23:00:00 via INTRAVENOUS

## 2016-05-30 MED ORDER — SODIUM CHLORIDE 0.9 % IV BOLUS (SEPSIS): 1000.0000 mL | Freq: Once | INTRAVENOUS | Status: DC

## 2016-05-30 MED ORDER — FENTANYL CITRATE (PF) 100 MCG/2ML IJ SOLN
50.0000 ug | Freq: Once | INTRAMUSCULAR | Status: AC
  Administered 2016-05-30: 50 ug via INTRAVENOUS

## 2016-05-30 MED ORDER — FENTANYL CITRATE (PF) 100 MCG/2ML IJ SOLN
50.0000 ug | Freq: Once | INTRAMUSCULAR | Status: AC
  Administered 2016-05-30: 50 ug via INTRAVENOUS
  Filled 2016-05-30: qty 2

## 2016-05-30 NOTE — ED Notes (Signed)
ED Provider at bedside. 

## 2016-05-30 NOTE — ED Notes (Signed)
Patient transported to CT 

## 2016-05-30 NOTE — ED Triage Notes (Addendum)
Pt was walking her dog, tripped over a curb, and fell onto her R shoulder. Pt with limited movement to R arm and shoulder. Pt tender to R upper arm. Also reports hitting her head on the pavement, is on coumadin for afib

## 2016-05-30 NOTE — ED Notes (Addendum)
YELLOW arm band placed on pt due to high fall risk

## 2016-05-31 MED ORDER — HYDROCODONE-ACETAMINOPHEN 5-325 MG PO TABS: 1.0000 | ORAL_TABLET | Freq: Four times a day (QID) | ORAL | 0 refills | Status: DC | PRN

## 2016-05-31 NOTE — ED Provider Notes (Signed)
[facility] DEPT Provider Note   CSN: [id number] Arrival date & time: 05/30/16  2055     History   Chief Complaint Chief Complaint  Patient presents with  . Fall    HPI Anna Mathews is a 76 y.o. female.  This 76 year old female who was walking her dog when she tripped on a curb wearing flip-flops, falling and hitting the back of her head as well as her right shoulder.  She now has a deformity of the right shoulder with inability to move her right arm.  This happened approximately 2 hours prior to arrival in the emergency department      Past Medical History:  Diagnosis Date  . Atrial fibrillation ([place]) 09/2009   on coumadin  . Diverticulosis 09/2006  . GERD (gastroesophageal reflux disease)   . Hiatal hernia   . Hyperlipidemia   . Hypertension   . Osteopenia   . Postmenopausal hormone replacement therapy 1989 - 07/2000    Patient Active Problem List   Diagnosis Date Noted  . Anticoagulated 07/08/2015  . Long term current use of anticoagulant therapy 06/09/2013  . HTN (hypertension) 03/31/2013  . Unspecified vitamin D deficiency 03/31/2013  . 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 Osteopenia 03/31/2013  . Atrial fibrillation ([place]) 11/06/2012    Past Surgical History:  Procedure Laterality Date  . 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 BREAST SURGERY Left 4/99   breast biopsy - fibrosis  . CESAREAN SECTION     X 2  . HYSTEROSCOPY  4/95   w/D&C    OB History    Gravida Para Term Preterm AB Living   2 2 2  0 0 2   SAB TAB Ectopic Multiple Live Births   0 0 0 0 2       Home Medications    Prior to Admission medications   Medication Sig Start Date End Date Taking? Authorizing Provider  amLODipine (NORVASC) 5 MG tablet TAKE 1 TABLET BY MOUTH DAILY 09/08/15   Jettie Booze, MD  aspirin  81 MG chewable tablet Chew 81 mg by mouth daily.    [provider]  Calcium Carbonate (CALTRATE 600 PO) Take 1 capsule by mouth 2 (two) times daily. Chocolate chew    [provider]  flecainide (TAMBOCOR) 100 MG tablet TAKE 1 TABLET (100 MG TOTAL) BY MOUTH 2 (TWO) TIMES DAILY. 07/25/15   Jettie Booze, MD  HYDROcodone-acetaminophen (NORCO/VICODIN) 5-325 MG tablet Take 1 tablet by mouth every 6 (six) hours as needed for severe pain. 05/31/16   Junius Creamer, NP  metoprolol succinate (TOPROL-XL) 50 MG 24 hr tablet Take 1 tablet (50 mg total) by mouth daily. 08/02/15   Jettie Booze, MD  Omega-3 Fatty Acids (OMEGA-3 PO) Take 3 tablets by mouth daily.     [provider]  quinapril (ACCUPRIL) 40 MG tablet Take 40 mg by mouth at bedtime.    [provider]  simvastatin (ZOCOR) 20 MG tablet Take 1 tablet by mouth daily. 02/23/13   [provider]  warfarin (COUMADIN) 5 MG tablet TAKE AS DIRECTED BY PRESCRIBER 02/24/16   Jettie Booze, MD    Family History Family History  Problem Relation Age of Onset  . Hypertension Mother   . Heart disease Father   . COPD Father   . Diabetes Maternal  Grandfather   . Diabetes Son   . Rheum arthritis Brother   . Kidney cancer Brother   . Breast cancer Maternal Grandmother   . Diabetes Maternal Aunt   . Diabetes Maternal Aunt     Social History Social History  Substance Use Topics  . Smoking status: Former Smoker    Packs/day: 0.25    Years: 12.00    Types: Cigarettes    Start date: 01/16/1957    Quit date: 01/23/1968  . Smokeless tobacco: Never Used  . Alcohol use Yes     Comment: social     Allergies   Penicillins and Sulfa antibiotics   Review of Systems Review of Systems  Constitutional: Negative for fever.  Eyes: Negative for visual disturbance.  Gastrointestinal: Negative for nausea.  Musculoskeletal: Positive for arthralgias and joint swelling.  Neurological: Negative for  dizziness and headaches.  All other systems reviewed and are negative.    Physical Exam Updated Vital Signs BP 114/60   Pulse (!) 57   Temp 98.4 F (36.9 C) (Oral)   Resp 20   LMP 01/23/1988 (Approximate)   SpO2 98%   Physical Exam  Constitutional: She appears well-developed and well-nourished.  HENT:  Head: Normocephalic.  Neck: Normal range of motion.  Cardiovascular: Normal rate.   Pulmonary/Chest: Effort normal.  Musculoskeletal: She exhibits tenderness and deformity.       Right shoulder: She exhibits decreased range of motion, deformity, pain and decreased strength. She exhibits normal pulse.  Neurological: She is alert.  Skin: Skin is warm.  Psychiatric: She has a normal mood and affect.  Nursing note and vitals reviewed.    ED Treatments / Results  Labs (all labs ordered are listed, but only abnormal results are displayed) Labs Reviewed - No data to display  EKG  EKG Interpretation None       Radiology Dg Shoulder Right  Result Date: 05/30/2016 CLINICAL DATA:  [place] walking dog.  Pain. EXAM: RIGHT SHOULDER - 2+ VIEW; RIGHT HUMERUS - 2+ VIEW COMPARISON:  None. FINDINGS: There is an anterior inferior shoulder dislocation. Suspected glenohumeral joint space narrowing. No definite fracture. IMPRESSION: Anterior inferior shoulder dislocation. No definite fracture. Postreduction films recommended. Electronically Signed   By: Staci Righter M.D.   On: 05/30/2016 22:05   Ct Head Wo Contrast  Result Date: 05/30/2016 CLINICAL DATA:  Trip and fall on concrete while walking dog. On blood thinners. Right-sided laceration. EXAM: CT HEAD WITHOUT CONTRAST CT CERVICAL SPINE WITHOUT CONTRAST TECHNIQUE: Multidetector CT imaging of the head and cervical spine was performed following the standard protocol without intravenous contrast. Multiplanar CT image reconstructions of the cervical spine were also generated. COMPARISON:  Head and cervical spine CT 10/27/2007 FINDINGS: CT HEAD  FINDINGS Brain: Age related atrophy and chronic small vessel ischemia. No evidence of hemorrhage, hydrocephalus, extra-axial collection or mass lesion/mass effect. No territorial ischemia. Vascular: Atherosclerosis of skullbase vasculature without hyperdense vessel or abnormal calcification. Skull: Normal. Negative for fracture or focal lesion. Sinuses/Orbits: Paranasal sinuses and mastoid air cells are clear. Stable tiny density in the right periorbital skin. The visualized orbits are unremarkable. Other: None. CT CERVICAL SPINE FINDINGS Alignment: Normal. Skull base and vertebrae: No acute fracture. Vertebral body heights are maintained. The dens and skull base are intact. Soft tissues and spinal canal: No prevertebral fluid or swelling. No visible canal hematoma. Disc levels: Disc space narrowing and endplate spurring at D7-O2, C5-C6, and C6-C7. Moderate multilevel facet arthropathy. Scattered neural foraminal stenosis. Upper chest: No acute abnormality.  Other: Carotid vascular calcifications. IMPRESSION: 1. No acute intracranial abnormality. Age-related atrophy and chronic small vessel ischemia. 2. 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 Electronically Signed   By: Jeb Levering M.D.   On: 05/30/2016 23:27   Ct Cervical Spine Wo Contrast  Result Date: 05/30/2016 CLINICAL DATA:  Trip and fall on concrete while walking dog. On blood thinners. Right-sided laceration. EXAM: CT HEAD WITHOUT CONTRAST CT CERVICAL SPINE WITHOUT CONTRAST TECHNIQUE: Multidetector CT imaging of the head and cervical spine was performed following the standard protocol without intravenous contrast. Multiplanar CT image reconstructions of the cervical spine were also generated. COMPARISON:  Head and cervical spine CT 10/27/2007 FINDINGS: CT HEAD FINDINGS Brain: Age related atrophy and chronic small vessel ischemia. No evidence of hemorrhage, hydrocephalus, extra-axial collection or  mass lesion/mass effect. No territorial ischemia. Vascular: Atherosclerosis of skullbase vasculature without hyperdense vessel or abnormal calcification. Skull: Normal. Negative for fracture or focal lesion. Sinuses/Orbits: Paranasal sinuses and mastoid air cells are clear. Stable tiny density in the right periorbital skin. The visualized orbits are unremarkable. Other: None. CT CERVICAL SPINE FINDINGS Alignment: Normal. Skull base and vertebrae: No acute fracture. Vertebral body heights are maintained. The dens and skull base are intact. Soft tissues and spinal canal: No prevertebral fluid or swelling. No visible canal hematoma. Disc levels: Disc space narrowing and endplate spurring at I4-P3, C5-C6, and C6-C7. Moderate multilevel facet arthropathy. Scattered neural foraminal stenosis. Upper chest: No acute abnormality. Other: Carotid vascular calcifications. IMPRESSION: 1. No acute intracranial abnormality. Age-related atrophy and chronic small vessel ischemia. 2. 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 Electronically Signed   By: Jeb Levering M.D.   On: 05/30/2016 23:27   Dg Shoulder Right Portable  Result Date: 05/31/2016 CLINICAL DATA:  Postreduction EXAM: PORTABLE RIGHT SHOULDER COMPARISON:  05/30/2016 FINDINGS: Anatomic position of the right shoulder is demonstrated postreduction. Defect in the lateral humeral head likely represents a Hill-Sachs deformity. No focal bone lesion or bone destruction. Soft tissues are unremarkable. IMPRESSION: Anatomic position of the right shoulder is demonstrated postreduction. Hill-Sachs deformity along the lateral humeral head. Electronically Signed   By: Lucienne Capers M.D.   On: 05/31/2016 00:17   Dg Humerus Right  Result Date: 05/30/2016 CLINICAL DATA:  [place] walking dog.  Pain. EXAM: RIGHT SHOULDER - 2+ VIEW; RIGHT HUMERUS - 2+ VIEW COMPARISON:  None. FINDINGS: There is an anterior inferior shoulder  dislocation. Suspected glenohumeral joint space narrowing. No definite fracture. IMPRESSION: Anterior inferior shoulder dislocation. No definite fracture. Postreduction films recommended. Electronically Signed   By: Staci Righter M.D.   On: 05/30/2016 22:05    Procedures Procedures (including critical care time)  Medications Ordered in ED Medications  sodium chloride 0.9 % bolus 1,000 mL (not administered)  0.9 %  sodium chloride infusion ( Intravenous New Bag/Given 05/30/16 2234)  fentaNYL (SUBLIMAZE) injection 50 mcg (50 mcg Intravenous Given 05/30/16 2234)  etomidate (AMIDATE) injection 7 mg (7 mg Intravenous Given 05/30/16 2335)  fentaNYL (SUBLIMAZE) injection 50 mcg (50 mcg Intravenous Given 05/30/16 2343)     Initial Impression / Assessment and Plan / ED Course  I have reviewed the triage vital signs and the nursing notes.  Pertinent labs & imaging results that were available during my care of the patient were reviewed by me and considered in my medical decision making (see chart for details).     Patient has right anterior dislocated humerus CT of her head and  neck are normal Dr. Lowella Dell supervised conscious sedation for reduction Reduction was successful.  Patient has been placed in an immobilizer.  She'll follow-up with orthopedics if needed.  She been given a prescription for pain control for the next 2-3 days.  Final Clinical Impressions(s) / ED Diagnoses   Final diagnoses:  Contusion of scalp, initial encounter  Dislocation of right shoulder joint, initial encounter    New Prescriptions New Prescriptions   HYDROCODONE-ACETAMINOPHEN (NORCO/VICODIN) 5-325 MG TABLET    Take 1 tablet by mouth every 6 (six) hours as needed for severe pain.     Junius Creamer, NP 05/31/16 8016    Merrily Pew, MD 05/31/16 1501

## 2016-05-31 NOTE — ED Provider Notes (Addendum)
Medical screening examination/treatment/procedure(s) were conducted as a shared visit with non-physician practitioner(s) and myself.  I personally evaluated the patient during the encounter.   Physical Exam  BP (!) 130/52   Pulse 62   Temp 98.4 F (36.9 C) (Oral)   Resp 14   LMP 01/23/1988 (Approximate)   SpO2 97%   Physical Exam  Cardiovascular: Intact distal pulses.   Musculoskeletal: She exhibits tenderness and deformity (right shoulder).  Neurological:  No altered mental status, able to give full seemingly accurate history.  Face is symmetric, EOM's intact, pupils equal and reactive, vision intact, tongue and uvula midline without deviation Upper and Lower extremity motor 5/5 (as well as possibly tested with shoulder dislocation), intact pain perception in distal extremities, 2+ reflexes in biceps, patella and achilles tendons.   Skin: Skin is warm and dry.  Abrasion to right side of scalp, posterior right shoulder and right arm    ED Course  .Sedation Date/Time: 05/31/2016 12:05 PM Performed by: Merrily Pew Authorized by: Merrily Pew   Consent:    Consent obtained:  Verbal and written   Consent given by:  Patient   Risks discussed:  Allergic reaction, inadequate sedation, dysrhythmia and respiratory compromise necessitating ventilatory assistance and intubation   Alternatives discussed:  Analgesia without sedation Indications:    Procedure performed:  Dislocation reduction   Procedure necessitating sedation performed by:  Physician performing sedation   Intended level of sedation:  Moderate (conscious sedation) Pre-sedation assessment:    Time since last food or drink:  5 hours   ASA classification: class 2 - patient with mild systemic disease     Neck mobility: normal     Mouth opening:  3 or more finger widths   Thyromental distance:  3 finger widths   Mallampati score:  II - soft palate, uvula, fauces visible   Pre-sedation assessments completed and reviewed:  airway patency, cardiovascular function, hydration status, mental status, nausea/vomiting, pain level, respiratory function and temperature     History of difficult intubation: no     Pre-sedation assessment completed:  05/30/2016 11:24 PM Immediate pre-procedure details:    Reassessment: Patient reassessed immediately prior to procedure     Reviewed: vital signs, relevant labs/tests and NPO status   Procedure details (see MAR for exact dosages):    Sedation start time:  05/30/2016 11:35 PM   Preoxygenation:  Nasal cannula   Sedation:  Etomidate   Analgesia:  Fentanyl   Intra-procedure monitoring:  Cardiac monitor, blood pressure monitoring, continuous capnometry, continuous pulse oximetry, frequent LOC assessments and frequent vital sign checks   Sedation end time:  05/30/2016 11:52 PM   Total sedation time (minutes):  17 Post-procedure details:    Post-sedation assessment completed:  05/31/2016 12:22 AM   Attendance: Constant attendance by certified staff until patient recovered     Recovery: Patient returned to pre-procedure baseline     Post-sedation assessments completed and reviewed: airway patency, cardiovascular function, hydration status, mental status, nausea/vomiting and pain level     Specimens recovered:  None   Patient is stable for discharge or admission: yes     Patient tolerance:  Tolerated well, no immediate complications ORTHOPEDIC INJURY TREATMENT Date/Time: 05/31/2016 12:08 PM Performed by: Merrily Pew Authorized by: Merrily Pew  Consent: Verbal consent obtained. Written consent obtained. Risks and benefits: risks, benefits and alternatives were discussed Consent given by: patient Patient understanding: patient states understanding of the procedure being performed Patient consent: the patient's understanding of the procedure matches consent given Procedure  consent: procedure consent matches procedure scheduled Relevant documents: relevant documents present and  verified Test results: test results available and properly labeled Imaging studies: imaging studies available Required items: required blood products, implants, devices, and special equipment available Patient identity confirmed: verbally with patient and arm band Time out: Immediately prior to procedure a "time out" was called to verify the correct patient, procedure, equipment, support staff and site/side marked as required. Injury location: shoulder Location details: right shoulder Injury type: dislocation Dislocation type: anterior Hill-Sachs deformity: yes Chronicity: new Pre-procedure neurovascular assessment: neurovascularly intact Pre-procedure distal perfusion: normal Pre-procedure neurological function: normal Pre-procedure range of motion: reduced  Anesthesia: Local anesthesia used: no  Sedation: Patient sedated: yes Manipulation performed: yes Reduction method: external rotation, scapular manipulation, Stimson maneuver and traction and counter traction Reduction successful: yes X-ray confirmed reduction: yes Immobilization: brace Post-procedure neurovascular assessment: post-procedure neurovascularly intact Post-procedure distal perfusion: normal Post-procedure neurological function: normal Post-procedure range of motion: normal Patient tolerance: Patient tolerated the procedure well with no immediate complications     MDM Shoulder reduction performed by Junius Creamer, NP without reduction and subsequently by myself without obvious reduction (did have some palpable movement and improved alignment on exam) but did have full ROM afterwards with XR confirming reduction. NVI. Sling immobilizer placed. Will allow to completely recover from sedation and likely dc to fu w/ ortho. Low concern for intracranial injuries currently.       Merrily Pew, MD 05/31/16 1214

## 2016-05-31 NOTE — Discharge Instructions (Signed)
Tonight you treated after a fall.  For shoulder dislocation your shoulder was successfully relocated.  Please wear the shoulder immobilizer for the next 3-5 days for comfort.  He been given prescription for pain control if needed.  Follow-up with orthopedics if you continue to have pain.

## 2016-05-31 NOTE — Progress Notes (Signed)
Orthopedic Tech Progress Note Patient Details:  Anna Mathews 01/26/1940 [id number]  Ortho Devices Type of Ortho Device: Sling immobilizer Ortho Device/Splint Location: rue Ortho Device/Splint Interventions: Ordered, Application   Karolee Stamps 05/31/2016, 5:03 AM

## 2016-06-04 ENCOUNTER — Ambulatory Visit (INDEPENDENT_AMBULATORY_CARE_PROVIDER_SITE_OTHER): Payer: Medicare Other

## 2016-06-04 DIAGNOSIS — I4891 Unspecified atrial fibrillation: Secondary | ICD-10-CM | POA: Diagnosis not present

## 2016-06-04 LAB — POCT INR: INR: 3.1

## 2016-06-08 ENCOUNTER — Ambulatory Visit (INDEPENDENT_AMBULATORY_CARE_PROVIDER_SITE_OTHER): Payer: Medicare Other | Admitting: Orthopaedic Surgery

## 2016-06-08 ENCOUNTER — Encounter (INDEPENDENT_AMBULATORY_CARE_PROVIDER_SITE_OTHER): Payer: Self-pay | Admitting: Orthopaedic Surgery

## 2016-06-08 VITALS — Ht 63.5 in | Wt 160.0 lb

## 2016-06-08 DIAGNOSIS — S43014D Anterior dislocation of right humerus, subsequent encounter: Secondary | ICD-10-CM | POA: Diagnosis not present

## 2016-06-08 NOTE — Progress Notes (Signed)
Office Visit Note   Patient: Anna Mathews           Date of Birth: [birth year]           MRN: [id number] Visit Date: 06/08/2016              Requested by: Vernie Shanks, MD [place], [place] [id number] PCP: Vernie Shanks, MD   Assessment & Plan: Visit Diagnoses:  1. Anterior dislocation of right shoulder, subsequent encounter     Plan: Continue the sling for 2 more weeks. I'll see her back at that time for recheck. Likely physical therapy to begin at that time also  Follow-Up Instructions: Return in about 2 weeks (around 06/22/2016).   Orders:  No orders of the defined types were placed in this encounter.  No orders of the defined types were placed in this encounter.     Procedures: No procedures performed   Clinical Data: No additional findings.   Subjective: Chief Complaint  Patient presents with  . Right Shoulder - Follow-up    Hospital follow up, status post shoulder dislocation anteriorly , reduced in ER. DOI 05/30/16.    Patient is a 76 year old female who sustained a right shoulder dislocation mechanical fall on 05/30/2016. She follows up today. She states she is still having pain. She has been wearing a sling. She denies any swelling or numbness and tingling. She is on warfarin.    Review of Systems  Constitutional: Negative.   HENT: Negative.   Eyes: Negative.   Respiratory: Negative.   Cardiovascular: Negative.   Endocrine: Negative.   Musculoskeletal: Negative.   Neurological: Negative.   Hematological: Negative.   Psychiatric/Behavioral: Negative.   All other systems reviewed and are negative.    Objective: Vital Signs: Ht 5' 3.5" (1.613 m)   Wt 160 lb (72.6 kg)   LMP 01/23/1988 (Approximate)   BMI 27.90 kg/m   Physical Exam  Constitutional: She is oriented to person, place, and time. She appears well-developed and well-nourished.  HENT:  Head: Normocephalic and atraumatic.  Eyes: EOM are normal.  Neck: Neck supple.    Pulmonary/Chest: Effort normal.  Abdominal: Soft.  Neurological: She is alert and oriented to person, place, and time.  Skin: Skin is warm. Capillary refill takes less than 2 seconds.  Psychiatric: She has a normal mood and affect. Her behavior is normal. Judgment and thought content normal.  Nursing note and vitals reviewed.   Ortho Exam Right upper extremity exam shows intact axillary nerve. Infraspinatus is intact. Supraspinatus not tested secondary to pain. Hand is warm well-perfused. Specialty Comments:  No specialty comments available.  Imaging: No results found.   PMFS History: Patient Active Problem List   Diagnosis Date Noted  . Anticoagulated 07/08/2015  . Long term current use of anticoagulant therapy 06/09/2013  . HTN (hypertension) 03/31/2013  . Unspecified vitamin D deficiency 03/31/2013  . 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 Osteopenia 03/31/2013  . Atrial fibrillation ([place]) 11/06/2012   Past Medical History:  Diagnosis Date  . Atrial fibrillation ([place]) 09/2009   on coumadin  . Diverticulosis 09/2006  . GERD (gastroesophageal reflux disease)   . Hiatal hernia   . Hyperlipidemia   . Hypertension   . Osteopenia   . Postmenopausal hormone replacement therapy 1989 - 07/2000    Family History  Problem Relation Age of Onset  . Hypertension Mother   . Heart disease Father   . COPD Father   .  Diabetes Maternal Grandfather   . Diabetes Son   . Rheum arthritis Brother   . Kidney cancer Brother   . Breast cancer Maternal Grandmother   . Diabetes Maternal Aunt   . Diabetes Maternal Aunt     Past Surgical History:  Procedure Laterality Date  . 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 BREAST SURGERY Left 4/99   breast biopsy - fibrosis  . CESAREAN SECTION     X 2  . HYSTEROSCOPY  4/95   w/D&C    Social History   Occupational History  . Not on file.   Social History Main Topics  . Smoking status: Former Smoker    Packs/day: 0.25    Years: 12.00    Types: Cigarettes    Start date: 01/16/1957    Quit date: 01/23/1968  . Smokeless tobacco: Never Used  . Alcohol use Yes     Comment: social  . Drug use: No  . Sexual activity: No

## 2016-06-22 ENCOUNTER — Ambulatory Visit (INDEPENDENT_AMBULATORY_CARE_PROVIDER_SITE_OTHER): Payer: Medicare Other | Admitting: Orthopaedic Surgery

## 2016-06-28 ENCOUNTER — Encounter: Payer: Self-pay | Admitting: Interventional Cardiology

## 2016-06-28 ENCOUNTER — Ambulatory Visit (INDEPENDENT_AMBULATORY_CARE_PROVIDER_SITE_OTHER): Payer: Medicare Other | Admitting: Interventional Cardiology

## 2016-06-28 ENCOUNTER — Ambulatory Visit (INDEPENDENT_AMBULATORY_CARE_PROVIDER_SITE_OTHER): Payer: Medicare Other | Admitting: *Deleted

## 2016-06-28 VITALS — BP 110/54 | HR 50 | Ht 63.5 in | Wt 157.1 lb

## 2016-06-28 DIAGNOSIS — I482 Chronic atrial fibrillation, unspecified: Secondary | ICD-10-CM

## 2016-06-28 DIAGNOSIS — Z7901 Long term (current) use of anticoagulants: Secondary | ICD-10-CM

## 2016-06-28 DIAGNOSIS — R0609 Other forms of dyspnea: Secondary | ICD-10-CM | POA: Diagnosis not present

## 2016-06-28 DIAGNOSIS — R0600 Dyspnea, unspecified: Secondary | ICD-10-CM

## 2016-06-28 DIAGNOSIS — I4891 Unspecified atrial fibrillation: Secondary | ICD-10-CM | POA: Diagnosis not present

## 2016-06-28 DIAGNOSIS — I10 Essential (primary) hypertension: Secondary | ICD-10-CM

## 2016-06-28 LAB — POCT INR: INR: 3.7

## 2016-06-28 MED ORDER — FLECAINIDE ACETATE 100 MG PO TABS: ORAL_TABLET | ORAL | 11 refills | Status: DC

## 2016-06-28 NOTE — Progress Notes (Signed)
Cardiology Office Note   Date:  06/28/2016   ID:  Anna Mathews, DOB Dec 31, 1940, MRN [id number]  PCP:  Vernie Shanks, MD    No chief complaint on file.  AFib  Wt Readings from Last 3 Encounters:  06/28/16 157 lb 1.9 oz (71.3 kg)  06/08/16 160 lb (72.6 kg)  05/14/16 160 lb (72.6 kg)       History of Present Illness: Anna Mathews is a 76 y.o. female  Who has AFib, maintaining NSR on flecainide.  We tried a lower dose of flecainide in the past but she had SHOB, so resumed the current dose.   Her husband died in 07-01-2015.  She has adjusted well. She moved and enjoys her downsized lifestyle.  She had a fall in 5/18 and hit her face and shoulder.  Head CT was negative.  She has a dislocated right shoulder.  It is being managed conservatively.  She has PT.  She has to wear a sling for another 3-4 weeks.  She has some SHOB, since she has been injured.  It occurs with walking the dog or going upstairs. She thinks it is from less activity.  She does have access to a gym with a stationary bike.    Past Medical History:  Diagnosis Date  . Atrial fibrillation ([place]) 09/2009   on coumadin  . Diverticulosis 09/2006  . GERD (gastroesophageal reflux disease)   . Hiatal hernia   . Hyperlipidemia   . Hypertension   . Osteopenia   . Postmenopausal hormone replacement therapy 1989 - 07/2000    Past Surgical History:  Procedure Laterality Date  . 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 BREAST SURGERY Left 4/99   breast biopsy - fibrosis  . CESAREAN SECTION     X 2  . HYSTEROSCOPY  4/95   w/D&C     Current Outpatient Prescriptions  Medication Sig Dispense Refill  . amLODipine (NORVASC) 5 MG tablet TAKE 1 TABLET BY MOUTH DAILY 30 tablet 9  . aspirin 81 MG chewable tablet Chew 81 mg by mouth daily.    . Calcium Carbonate (CALTRATE 600  PO) Take 1 capsule by mouth 2 (two) times daily. Chocolate chew    . flecainide (TAMBOCOR) 100 MG tablet TAKE 1 TABLET (100 MG TOTAL) BY MOUTH 2 (TWO) TIMES DAILY. 60 tablet 11  . HYDROcodone-acetaminophen (NORCO/VICODIN) 5-325 MG tablet Take 1 tablet by mouth every 6 (six) hours as needed for severe pain. 10 tablet 0  . metoprolol succinate (TOPROL-XL) 50 MG 24 hr tablet Take 1 tablet (50 mg total) by mouth daily. 90 tablet 3  . Omega-3 Fatty Acids (OMEGA-3 PO) Take 3 tablets by mouth daily.     . quinapril (ACCUPRIL) 40 MG tablet Take 40 mg by mouth at bedtime.    . simvastatin (ZOCOR) 20 MG tablet Take 1 tablet by mouth daily.    Marland Kitchen warfarin (COUMADIN) 5 MG tablet TAKE AS DIRECTED BY PRESCRIBER 30 tablet 3   No current facility-administered medications for this visit.     Allergies:   Penicillins and Sulfa antibiotics    Social History:  The patient  reports that she quit smoking about 48 years ago. Her smoking use included Cigarettes. She started smoking about 59 years ago. She has a 3.00 pack-year smoking history. She has never used smokeless tobacco.  She reports that she drinks alcohol. She reports that she does not use drugs.   Family History:  [name] family history includes Breast cancer in her maternal grandmother; COPD in her father; Diabetes in her maternal aunt, maternal aunt, maternal grandfather, and son; Heart disease in her father; Hypertension in her mother; Kidney cancer in her brother; Rheum arthritis in her brother.    ROS:  Please see the history of present illness.   Otherwise, review of systems are positive for DOE since her injury.   All other systems are reviewed and negative.    PHYSICAL EXAM: VS:  BP (!) 110/54   Pulse (!) 50   Ht 5' 3.5" (1.613 m)   Wt 157 lb 1.9 oz (71.3 kg)   LMP 01/23/1988 (Approximate)   SpO2 94%   BMI 27.40 kg/m  , BMI Body mass index is 27.4 kg/m. GEN: Well nourished, well developed, in no acute distress  HEENT: normal  Neck:  no JVD, carotid bruits, or masses Cardiac: RRR; no murmurs, rubs, or gallops,no edema  Respiratory:  clear to auscultation bilaterally, normal work of breathing GI: soft, nontender, nondistended, + BS MS: no deformity or atrophy ; right arm in a sling Skin: warm and dry, no rash Neuro:  Strength and sensation are intact Psych: euthymic mood, full affect   EKG:   The ekg ordered today demonstrates SB, no ST changes   Recent Labs: No results found for requested labs within last 8760 hours.   Lipid Panel No results found for: CHOL, TRIG, HDL, CHOLHDL, VLDL, LDLCALC, LDLDIRECT   Other studies Reviewed: Additional studies/ records that were reviewed today with results demonstrating: 2014 echo: nl LV function, mild aortic sclerosis.   ASSESSMENT AND PLAN:  1. AFib: Continue Flecainide.  Refill for one year.  Maintaining normal sinus rhythm. Continue beta blocker as well. 2. HTN: BP controlled.  Continue current meds.   3. Anticoagulated:  Coumadin being managed.  Dose changed.  INR 3.7 today.  Return in a few weeks for repeat INR check. 4. Dyspnea on exertion: Has occurred since she has cut back on exercise due to her shoulder injury. Asked that she try to use a stationary bike to get her heart rate. Hopefully, this will help with stamina. She should be out of her sling in about a month.   Current medicines are reviewed at length with the patient today.  The patient concerns regarding her medicines were addressed.  The following changes have been made:  No change  Labs/ tests ordered today include:  No orders of the defined types were placed in this encounter.   Recommend 150 minutes/week of aerobic exercise Low fat, low carb, high fiber diet recommended  Disposition:   FU in 1 year   Signed, Larae Grooms, MD  06/28/2016 3:16 PM    [facility] [address], [place], [place]  [address] Phone: [phone]; Fax: [phone]

## 2016-06-28 NOTE — Patient Instructions (Signed)

## 2016-07-03 ENCOUNTER — Ambulatory Visit: Payer: Medicare Other | Admitting: Interventional Cardiology

## 2016-07-12 ENCOUNTER — Ambulatory Visit (INDEPENDENT_AMBULATORY_CARE_PROVIDER_SITE_OTHER): Payer: Medicare Other | Admitting: Pharmacist

## 2016-07-12 DIAGNOSIS — I4891 Unspecified atrial fibrillation: Secondary | ICD-10-CM

## 2016-07-12 LAB — POCT INR: INR: 2.8

## 2016-07-18 ENCOUNTER — Other Ambulatory Visit: Payer: Self-pay | Admitting: Interventional Cardiology

## 2016-07-22 ENCOUNTER — Other Ambulatory Visit: Payer: Self-pay | Admitting: Interventional Cardiology

## 2016-08-02 ENCOUNTER — Ambulatory Visit (INDEPENDENT_AMBULATORY_CARE_PROVIDER_SITE_OTHER): Payer: Medicare Other | Admitting: *Deleted

## 2016-08-02 ENCOUNTER — Encounter (INDEPENDENT_AMBULATORY_CARE_PROVIDER_SITE_OTHER): Payer: Self-pay

## 2016-08-02 DIAGNOSIS — I4891 Unspecified atrial fibrillation: Secondary | ICD-10-CM | POA: Diagnosis not present

## 2016-08-02 LAB — POCT INR: INR: 4.1

## 2016-08-17 ENCOUNTER — Ambulatory Visit (INDEPENDENT_AMBULATORY_CARE_PROVIDER_SITE_OTHER): Payer: Medicare Other | Admitting: *Deleted

## 2016-08-17 DIAGNOSIS — I4891 Unspecified atrial fibrillation: Secondary | ICD-10-CM | POA: Diagnosis not present

## 2016-08-17 LAB — POCT INR: INR: 3.4

## 2016-08-21 ENCOUNTER — Telehealth: Payer: Self-pay | Admitting: Obstetrics & Gynecology

## 2016-08-21 NOTE — Telephone Encounter (Signed)
Left message for patient to call to reschedule [name] Appointment

## 2016-08-29 ENCOUNTER — Ambulatory Visit (INDEPENDENT_AMBULATORY_CARE_PROVIDER_SITE_OTHER): Payer: Medicare Other | Admitting: *Deleted

## 2016-08-29 DIAGNOSIS — I4891 Unspecified atrial fibrillation: Secondary | ICD-10-CM | POA: Diagnosis not present

## 2016-08-29 LAB — POCT INR: INR: 2.1

## 2016-09-17 ENCOUNTER — Other Ambulatory Visit: Payer: Self-pay | Admitting: Interventional Cardiology

## 2016-09-21 ENCOUNTER — Ambulatory Visit (INDEPENDENT_AMBULATORY_CARE_PROVIDER_SITE_OTHER): Payer: Medicare Other

## 2016-09-21 DIAGNOSIS — I4891 Unspecified atrial fibrillation: Secondary | ICD-10-CM | POA: Diagnosis not present

## 2016-09-21 LAB — POCT INR: INR: 2

## 2016-10-19 ENCOUNTER — Ambulatory Visit (INDEPENDENT_AMBULATORY_CARE_PROVIDER_SITE_OTHER): Payer: Medicare Other | Admitting: Pharmacist

## 2016-10-19 DIAGNOSIS — I4891 Unspecified atrial fibrillation: Secondary | ICD-10-CM

## 2016-10-19 DIAGNOSIS — Z7901 Long term (current) use of anticoagulants: Secondary | ICD-10-CM

## 2016-10-19 LAB — POCT INR: INR: 2.6

## 2016-11-12 ENCOUNTER — Other Ambulatory Visit: Payer: Self-pay | Admitting: *Deleted

## 2016-11-12 MED ORDER — WARFARIN SODIUM 5 MG PO TABS: ORAL_TABLET | ORAL | 3 refills | Status: DC

## 2016-11-12 NOTE — Telephone Encounter (Signed)
Pharmacy requests a ninety day rx. 

## 2016-11-15 ENCOUNTER — Ambulatory Visit (INDEPENDENT_AMBULATORY_CARE_PROVIDER_SITE_OTHER): Payer: Medicare Other | Admitting: *Deleted

## 2016-11-15 DIAGNOSIS — I4891 Unspecified atrial fibrillation: Secondary | ICD-10-CM | POA: Diagnosis not present

## 2016-11-15 DIAGNOSIS — Z5181 Encounter for therapeutic drug level monitoring: Secondary | ICD-10-CM | POA: Diagnosis not present

## 2016-11-15 LAB — POCT INR: INR: 3

## 2016-11-30 ENCOUNTER — Telehealth: Payer: Self-pay | Admitting: Obstetrics and Gynecology

## 2016-11-30 ENCOUNTER — Telehealth: Payer: Self-pay | Admitting: Interventional Cardiology

## 2016-11-30 NOTE — Telephone Encounter (Signed)
Patient calling and wanting to know if she can switch from coumadin to Eliquis. Patient does not want to continue with the diet restrictions associated with Coumadin. Made patient aware that the information would be forwarded to Dr. Irish Lack for review and recommendation. Patient verbalized understanding and thanked me for the call.

## 2016-11-30 NOTE — Telephone Encounter (Signed)
Left message to call Jahne Krukowski at [phone].  

## 2016-11-30 NOTE — Telephone Encounter (Signed)
Patient made aware that Dr. Irish Lack is okay with her switching from Coumadin to Eliquis. Made patient aware that the information will be forwarded to the [facility]in Clinic to arrange transition. Patient verbalized understanding and thanked me for the call.

## 2016-11-30 NOTE — Telephone Encounter (Signed)
OK to switch from Coumadin to Eliquis.

## 2016-11-30 NOTE — Telephone Encounter (Signed)
Patient would like to speak with nurse about her medication. °

## 2016-11-30 NOTE — Telephone Encounter (Signed)
Spoke with patient. Patient states BMD done 1 month ago at [facility], was seen by PCP today. Recommended f/u with GYN to discuss treatment options for osteopenia.   Patient states she has tried "pill" in the past, caused too much reflux, briefly discussed Reclast with PCP.   OV scheduled with Dr. Talbert Nan on 12/20/16 at 11:30am for BMD consult. Patient verbalizes understanding and is agreeable.   Last AEX 05/14/16 PG Next 05/16/17 JJ  Routing to provider for final review. Patient is agreeable to disposition. Will close encounter.

## 2016-11-30 NOTE — Telephone Encounter (Signed)
Patient returning your call.

## 2016-11-30 NOTE — Telephone Encounter (Signed)
New message    Patient wants to know if she can switch from  Warfarin  To Eliquis. Wants to know if Dr Irish Lack thinks it would be best option to change.  Pt c/o medication issue:  1. Name of Medication: Warfarin  2. How are you currently taking this medication (dosage and times per day)?  As prescribed  3. Are you having a reaction (difficulty breathing--STAT)? No  4. What is your medication issue? Patient does not like the diet that comes with taking Warfarin

## 2016-12-03 NOTE — Telephone Encounter (Signed)
Call to [facility] to request copy of recent BMD, will fax to [phone].

## 2016-12-03 NOTE — Telephone Encounter (Signed)
Spoke with patient - she is aware that the transition will occur at her next coumadin appointment

## 2016-12-03 NOTE — Telephone Encounter (Signed)
Results received and placed on Dr. Gentry Fitz desk.   Routing to Dr. Talbert Nan.

## 2016-12-20 ENCOUNTER — Encounter: Payer: Self-pay | Admitting: Obstetrics and Gynecology

## 2016-12-20 ENCOUNTER — Other Ambulatory Visit: Payer: Self-pay

## 2016-12-20 ENCOUNTER — Ambulatory Visit: Payer: Medicare Other | Admitting: Obstetrics and Gynecology

## 2016-12-20 VITALS — BP 110/62 | HR 72 | Resp 16 | Wt 156.4 lb

## 2016-12-20 DIAGNOSIS — M8580 Other specified disorders of bone density and structure, unspecified site: Secondary | ICD-10-CM | POA: Diagnosis not present

## 2016-12-20 NOTE — Progress Notes (Addendum)
GYNECOLOGY  VISIT   HPI: 76 y.o.   Widowed  Caucasian  female   G2P2002 with Patient's last menstrual period was 01/23/1988 (approximate).   here for   BMD consult/ patient complains of having "white cysts" in vaginal area. The white cysts have been present for over a year. No pain. One is big, a couple  [name] recent DEXA returned with osteopenia, but an elevated risk of hip fracture. Her FRAX 10 year risk of hip fracture is 3.8% and of any fracture is 14%.  In the past she tried fosamax, but it worsened her gastric reflux.  She has a h/o afib and is on anticoagulation.   GYNECOLOGIC HISTORY: Patient's last menstrual period was 01/23/1988 (approximate). Contraception:Postmenopausal Menopausal hormone therapy: none        OB History    Gravida Para Term Preterm AB Living   2 2 2  0 0 2   SAB TAB Ectopic Multiple Live Births   0 0 0 0 2         Patient Active Problem List   Diagnosis Date Noted  . Anticoagulated 07/08/2015  . Long term current use of anticoagulant therapy 06/09/2013  . HTN (hypertension) 03/31/2013  . Unspecified vitamin D deficiency 03/31/2013  . 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 Osteopenia 03/31/2013  . Atrial fibrillation ([place]) 11/06/2012    Past Medical History:  Diagnosis Date  . Atrial fibrillation ([place]) 09/2009   on coumadin  . Diverticulosis 09/2006  . GERD (gastroesophageal reflux disease)   . Hiatal hernia   . Hyperlipidemia   . Hypertension   . Osteopenia   . Postmenopausal hormone replacement therapy 1989 - 07/2000    Past Surgical History:  Procedure Laterality Date  . 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 BREAST SURGERY Left 4/99   breast biopsy - fibrosis  . CESAREAN SECTION     X 2  . HYSTEROSCOPY  4/95   w/D&C    Current Outpatient Medications  Medication  Sig Dispense Refill  . amLODipine (NORVASC) 5 MG tablet TAKE 1 TABLET BY MOUTH DAILY 30 tablet 11  . aspirin 81 MG chewable tablet Chew 81 mg by mouth daily.    . Calcium Carbonate (CALTRATE 600 PO) Take 1 capsule by mouth 2 (two) times daily. Chocolate chew    . flecainide (TAMBOCOR) 100 MG tablet TAKE 1 TABLET (100 MG TOTAL) BY MOUTH 2 (TWO) TIMES DAILY. 60 tablet 11  . metoprolol succinate (TOPROL-XL) 50 MG 24 hr tablet TAKE 1 TABLET (50 MG TOTAL) BY MOUTH DAILY. 90 tablet 3  . Omega-3 Fatty Acids (OMEGA-3 PO) Take 3 tablets by mouth daily.     . quinapril (ACCUPRIL) 40 MG tablet Take 40 mg by mouth at bedtime.    . simvastatin (ZOCOR) 20 MG tablet Take 1 tablet by mouth daily.    Marland Kitchen warfarin (COUMADIN) 5 MG tablet TAKE AS DIRECTED BY PRESCRIBER 30 tablet 3  . omeprazole (PRILOSEC) 20 MG capsule Take 20 mg by mouth as needed.     No current facility-administered medications for this visit.      ALLERGIES: Penicillins and Sulfa antibiotics  Family History  Problem Relation Age of Onset  . Hypertension Mother   . Heart disease Father   . COPD Father   . Diabetes Maternal Grandfather   . Diabetes Son   .  Rheum arthritis Brother   . Kidney cancer Brother   . Breast cancer Maternal Grandmother   . Diabetes Maternal Aunt   . Diabetes Maternal Aunt     Social History   Socioeconomic History  . Marital status: Widowed    Spouse name: Not on file  . Number of children: 2  . Years of education: Not on file  . Highest education level: Not on file  Social Needs  . Financial resource strain: Not on file  . Food insecurity - worry: Not on file  . Food insecurity - inability: Not on file  . Transportation needs - medical: Not on file  . Transportation needs - non-medical: Not on file  Occupational History  . Not on file  Tobacco Use  . Smoking status: Former Smoker    Packs/day: 0.25    Years: 12.00    Pack years: 3.00    Types: Cigarettes    Start date: 01/16/1957    Last  attempt to quit: 01/23/1968    Years since quitting: 48.9  . Smokeless tobacco: Never Used  Substance and Sexual Activity  . Alcohol use: Yes    Comment: social  . Drug use: No  . Sexual activity: No    Partners: Male    Birth control/protection: Post-menopausal  Other Topics Concern  . Not on file  Social History Narrative  . Not on file    Review of Systems  Constitutional:       Hair loss  HENT: Negative.   Eyes: Negative.   Respiratory: Negative.   Cardiovascular: Negative.   Gastrointestinal: Negative.   Genitourinary: Negative.   Musculoskeletal: Negative.   Skin: Negative.   Neurological: Negative.   Endo/Heme/Allergies: Negative.   Psychiatric/Behavioral: Negative.     PHYSICAL EXAMINATION:    BP 110/62 (BP Location: Right Arm, Patient Position: Sitting, Cuff Size: Normal)   Pulse 72   Resp 16   Wt 156 lb 6.4 oz (70.9 kg)   LMP 01/23/1988 (Approximate)   BMI 27.27 kg/m     General appearance: alert, cooperative and appears stated age  Pelvic: External genitalia:  no lesions, multiple epidermal cysts              Urethra:  normal appearing urethra with no masses, tenderness or lesions              Bartholins and Skenes: normal                  ASSESSMENT Epidermal vulvar cysts, patient reassured normal Increased risk of fracture on DEXA    PLAN Discussed risk of fracture and recommendation for tretment She is getting calcium and vit D Continue exercising Discussed option for treatment, she isn't a candidate for oral biphosphonates declines IV biphosphonates, interested in Prolia Discussed risks and potential side effects with prolia, information given (from [facility] on prolia and osteoporosis prevention and treatment) Will get a copy of her labs from her primary. She will return for an additional labs needed (CBC, CMP, vit D and phosphorous)   An After Visit Summary was printed and given to the patient.  15 minutes face to face time of which over 50% was  spent in counseling.   Addendum:  Labs from primary received and reviewed. She will need to return for a vit D, CMP and Phosphorous.

## 2016-12-21 ENCOUNTER — Telehealth: Payer: Self-pay

## 2016-12-21 ENCOUNTER — Other Ambulatory Visit: Payer: Self-pay | Admitting: *Deleted

## 2016-12-21 ENCOUNTER — Ambulatory Visit (INDEPENDENT_AMBULATORY_CARE_PROVIDER_SITE_OTHER): Payer: Medicare Other | Admitting: *Deleted

## 2016-12-21 DIAGNOSIS — Z5181 Encounter for therapeutic drug level monitoring: Secondary | ICD-10-CM

## 2016-12-21 DIAGNOSIS — M8580 Other specified disorders of bone density and structure, unspecified site: Secondary | ICD-10-CM

## 2016-12-21 DIAGNOSIS — I4891 Unspecified atrial fibrillation: Secondary | ICD-10-CM

## 2016-12-21 LAB — POCT INR: INR: 2.1

## 2016-12-21 MED ORDER — APIXABAN 5 MG PO TABS: 5.0000 mg | ORAL_TABLET | Freq: Two times a day (BID) | ORAL | 0 refills | Status: DC

## 2016-12-21 MED ORDER — APIXABAN 5 MG PO TABS: 5.0000 mg | ORAL_TABLET | Freq: Two times a day (BID) | ORAL | 3 refills | Status: DC

## 2016-12-21 MED ORDER — DENOSUMAB 60 MG/ML ~~LOC~~ SOLN: 60.0000 mg | SUBCUTANEOUS | 1 refills | Status: DC

## 2016-12-21 NOTE — Telephone Encounter (Signed)
Spoke with patient. Lab appointment scheduled for 12/24/2016 9:45 am. Orders placed. Patient is agreeable. Patient would like to start Prolia PA process.   Spoke with [facility] Medicare. Patient's specialty pharmacy is [facility]. Rx for Prolia 60 mg/ ml #1 1RF sent to [place] to start PA process.

## 2016-12-21 NOTE — Telephone Encounter (Signed)
-----   Message from Salvadore Dom, MD sent at 12/20/2016  5:10 PM EST ----- Please let the patient know that I got her labs from her primary. She needs to return for a vit D, CMP, Phosphorous. Please order these and make a lab appointment for her. Please start the process for getting prolia for her.  She has osteopenia with an elevated risk of hip fracture on her FRAX calculation. Hopefully her insurance will cover it.  Thanks, Sharee Pimple

## 2016-12-21 NOTE — Patient Instructions (Signed)
Skip Coumadin today, completely discontinue Coumadin. Start Eliquis 5mg  Twice a day, 12 hours apart tomorrow with the morning dosage.  Recheck INR 4 weeks.  Call if any new medications or scheduled for any procedures [phone]

## 2016-12-24 ENCOUNTER — Other Ambulatory Visit (INDEPENDENT_AMBULATORY_CARE_PROVIDER_SITE_OTHER): Payer: Medicare Other

## 2016-12-24 DIAGNOSIS — M8580 Other specified disorders of bone density and structure, unspecified site: Secondary | ICD-10-CM

## 2016-12-25 LAB — COMPREHENSIVE METABOLIC PANEL
ALT: 22 IU/L (ref 0–32)
AST: 23 IU/L (ref 0–40)
Albumin/Globulin Ratio: 2 (ref 1.2–2.2)
Albumin: 4.4 g/dL (ref 3.5–4.8)
Alkaline Phosphatase: 80 IU/L (ref 39–117)
BUN/Creatinine Ratio: 23 (ref 12–28)
BUN: 23 mg/dL (ref 8–27)
Bilirubin Total: 0.5 mg/dL (ref 0.0–1.2)
CO2: 26 mmol/L (ref 20–29)
Calcium: 9.5 mg/dL (ref 8.7–10.3)
Chloride: 103 mmol/L (ref 96–106)
Creatinine, Ser: 1 mg/dL (ref 0.57–1.00)
GFR calc Af Amer: 63 mL/min/{1.73_m2} (ref >59)
GFR calc non Af Amer: 54 mL/min/{1.73_m2} — ABNORMAL LOW (ref >59)
Globulin, Total: 2.2 g/dL (ref 1.5–4.5)
Glucose: 154 mg/dL — ABNORMAL HIGH (ref 65–99)
Potassium: 4.5 mmol/L (ref 3.5–5.2)
Sodium: 143 mmol/L (ref 134–144)
Total Protein: 6.6 g/dL (ref 6.0–8.5)

## 2016-12-25 LAB — PHOSPHORUS: Phosphorus: 3.3 mg/dL (ref 2.5–4.5)

## 2016-12-25 LAB — VITAMIN D 25 HYDROXY (VIT D DEFICIENCY, FRACTURES): Vit D, 25-Hydroxy: 46.2 ng/mL (ref 30.0–100.0)

## 2017-01-03 NOTE — Telephone Encounter (Signed)
Spoke with Tammy with [facility] who states [facility] will be reaching out to the patient to discuss benefits today for shipping.

## 2017-01-08 NOTE — Telephone Encounter (Signed)
Call to [place]. John contacted the patient and left a message for a return call to authorize shipment with the patient.

## 2017-01-10 NOTE — Telephone Encounter (Signed)
Spoke with [facility]. Prolia will be delivered on 01/17/2017. Spoke with patient. Appointment for injection scheduled for 01/23/2017 at 2:30 pm. Patient is agreeable to date and time.  Routing to provider for final review. Patient agreeable to disposition. Will close encounter.

## 2017-01-18 ENCOUNTER — Ambulatory Visit (INDEPENDENT_AMBULATORY_CARE_PROVIDER_SITE_OTHER): Payer: Medicare Other | Admitting: *Deleted

## 2017-01-18 DIAGNOSIS — I4891 Unspecified atrial fibrillation: Secondary | ICD-10-CM | POA: Diagnosis not present

## 2017-01-18 DIAGNOSIS — Z5181 Encounter for therapeutic drug level monitoring: Secondary | ICD-10-CM

## 2017-01-18 NOTE — Progress Notes (Signed)
Pt was started on Eliquis 5mg  twice a day for Afib on 12/21/16 by Dr. Irish Lack.      Reviewed patients medication list.  Pt is not currently on any combined P-gp and strong CYP3A4 inhibitors/inducers (ketoconazole, traconazole, ritonavir, carbamazepine, phenytoin, rifampin, St. John's wort).  Reviewed labs: SCr-0.85, Hgb-14.2, HCT- 42.9, Weight-71.0kg.  Dose appropriate based on age, weight, and SCr.  Hgb and HCT within normal limits.    A full discussion of the nature of anticoagulants has been carried out.  A benefit/risk analysis has been presented to the patient, so that they understand the justification for choosing anticoagulation with Eliquis at this time.  The need for compliance is stressed.  Pt is aware to take the medication twice daily.  Side effects of potential bleeding are discussed, including unusual colored urine or stools, coughing up blood or coffee ground emesis, nose bleeds or serious fall or head trauma.  Discussed signs and symptoms of stroke. The patient should avoid any OTC items containing aspirin or ibuprofen.  Avoid alcohol consumption.   Call if any signs of abnormal bleeding.  Discussed financial obligations and resolved any difficulty in obtaining medication.   01/21/17-[place],Pharm D spoke with pt & informed pt of lab results. While on the phone the pt asked if she needed to hold Eliquis for Prolia injection and informed her she does not need to hold for her Prolia injection. Pt will be followed by Cardiologist & pt will call back with any questions.

## 2017-01-19 LAB — CBC
Hematocrit: 42.9 % (ref 34.0–46.6)
Hemoglobin: 14.2 g/dL (ref 11.1–15.9)
MCH: 30.3 pg (ref 26.6–33.0)
MCHC: 33.1 g/dL (ref 31.5–35.7)
MCV: 92 fL (ref 79–97)
Platelets: 232 10*3/uL (ref 150–379)
RBC: 4.69 x10E6/uL (ref 3.77–5.28)
RDW: 13.5 % (ref 12.3–15.4)
WBC: 6.9 10*3/uL (ref 3.4–10.8)

## 2017-01-19 LAB — BASIC METABOLIC PANEL
BUN/Creatinine Ratio: 28 (ref 12–28)
BUN: 24 mg/dL (ref 8–27)
CO2: 22 mmol/L (ref 20–29)
Calcium: 10.2 mg/dL (ref 8.7–10.3)
Chloride: 106 mmol/L (ref 96–106)
Creatinine, Ser: 0.85 mg/dL (ref 0.57–1.00)
GFR calc Af Amer: 76 mL/min/{1.73_m2} (ref >59)
GFR calc non Af Amer: 66 mL/min/{1.73_m2} (ref >59)
Glucose: 90 mg/dL (ref 65–99)
Potassium: 4.7 mmol/L (ref 3.5–5.2)
Sodium: 146 mmol/L — ABNORMAL HIGH (ref 134–144)

## 2017-01-21 ENCOUNTER — Telehealth: Payer: Self-pay | Admitting: *Deleted

## 2017-01-21 NOTE — Telephone Encounter (Signed)
Spoke with patient. She is schedule to have Prolia injection this week and wondered if she needed to hold her Eliquis. Advised that she does not need to hold Eliquis for this injection. Also advised of lab results from last week for Eliquis. Pt stated understanding and appreciation.

## 2017-01-21 NOTE — Telephone Encounter (Signed)
Patient calling, would like to speak with someone

## 2017-01-23 ENCOUNTER — Ambulatory Visit (INDEPENDENT_AMBULATORY_CARE_PROVIDER_SITE_OTHER): Payer: Medicare Other | Admitting: *Deleted

## 2017-01-23 ENCOUNTER — Other Ambulatory Visit: Payer: Self-pay

## 2017-01-23 VITALS — BP 138/72 | HR 72 | Resp 14 | Ht 63.5 in | Wt 159.0 lb

## 2017-01-23 DIAGNOSIS — M8580 Other specified disorders of bone density and structure, unspecified site: Secondary | ICD-10-CM

## 2017-01-23 MED ORDER — DENOSUMAB 60 MG/ML ~~LOC~~ SOLN
60.0000 mg | Freq: Once | SUBCUTANEOUS | Status: AC
  Administered 2017-01-23: 60 mg via SUBCUTANEOUS

## 2017-01-23 NOTE — Progress Notes (Signed)
Patient in today for first Prolia injection. Patient's initial calcium level was obtained on 12/24/16.  Result: 9.5.  Last AEX: 05/14/16 PG  Last BMD: 11/01/16 osteopenia   Injection given in left upper arm.  Patient tolerated injection well.  Routed to provider for review.

## 2017-02-21 DIAGNOSIS — M1712 Unilateral primary osteoarthritis, left knee: Secondary | ICD-10-CM | POA: Insufficient documentation

## 2017-02-21 DIAGNOSIS — M25562 Pain in left knee: Secondary | ICD-10-CM | POA: Insufficient documentation

## 2017-03-20 DIAGNOSIS — S83289A Other tear of lateral meniscus, current injury, unspecified knee, initial encounter: Secondary | ICD-10-CM | POA: Insufficient documentation

## 2017-03-20 DIAGNOSIS — M2242 Chondromalacia patellae, left knee: Secondary | ICD-10-CM | POA: Insufficient documentation

## 2017-03-20 DIAGNOSIS — S83249A Other tear of medial meniscus, current injury, unspecified knee, initial encounter: Secondary | ICD-10-CM | POA: Insufficient documentation

## 2017-05-03 ENCOUNTER — Telehealth: Payer: Self-pay

## 2017-05-03 NOTE — Telephone Encounter (Signed)
   [facility] Pre-operative Risk Assessment    Request for surgical clearance:  1. What type of surgery is being performed? Colonoscopy   2. When is this surgery scheduled? 05/09/2017   3. What type of clearance is required (medical clearance vs. Pharmacy clearance to hold med vs. Both)? Both   4. Are there any medications that need to be held prior to surgery and how long? ELIQUIS, not listed   5. Practice name and name of physician performing surgery? [facility], Dr. Benson Norway   6. What is your office phone number? [phone]    7.   What is your office fax number [phone]  8.   Anesthesia type (None, local, MAC, general) ? Not Listed    Joaquim Lai 05/03/2017, 3:45 PM  _________________________________________________________________   (provider comments below)

## 2017-05-03 NOTE — Telephone Encounter (Signed)
Patient with diagnosis of Afib on Eliquis for anticoagulation.    Procedure: colonscopy Date of procedure: 05/09/17  CHADS2-VASc score of  4 (CHF, HTN, AGE, DM2, stroke/tia x 2, CAD, AGE, female)  CrCl 76ml/min  Per office protocol, patient can hold Eliquis for 24 hours prior to procedure.

## 2017-05-06 NOTE — Telephone Encounter (Signed)
Pt should be OK for the procedure if no new symptoms. I have left a VM for her to call back and ask for preop clinic.

## 2017-05-07 NOTE — Telephone Encounter (Signed)
05/07/2017 Second voicemail left  Rosaria Ferries, PA-C 05/07/2017 3:16 PM [facility] [phone]

## 2017-05-08 NOTE — Telephone Encounter (Signed)
New Message:     Pt calling to talk to someone regarding her upcoming procedure. Pt states she was told to call and give the okay

## 2017-05-08 NOTE — Telephone Encounter (Signed)
New message  Pt verbalized that she is returning call for [facility] or a PA

## 2017-05-08 NOTE — Telephone Encounter (Signed)
I have spoken to Ms. Yadao. She is feeling well. She has not had her Eliquis this morning and is instructed to hold tonight's dose as well - pharmacy's recommendation was to hold for 24 hours.   Will route to Dr. Benson Norway.   Burtis Junes, RN, [place] [address] [place], [place]  [phone]

## 2017-05-11 ENCOUNTER — Other Ambulatory Visit: Payer: Self-pay | Admitting: Interventional Cardiology

## 2017-05-13 ENCOUNTER — Other Ambulatory Visit: Payer: Self-pay | Admitting: Gastroenterology

## 2017-05-13 DIAGNOSIS — Z1211 Encounter for screening for malignant neoplasm of colon: Secondary | ICD-10-CM

## 2017-05-15 ENCOUNTER — Ambulatory Visit: Payer: Medicare Other | Admitting: Nurse Practitioner

## 2017-05-16 ENCOUNTER — Other Ambulatory Visit: Payer: Self-pay

## 2017-05-16 ENCOUNTER — Encounter: Payer: Self-pay | Admitting: Obstetrics and Gynecology

## 2017-05-16 ENCOUNTER — Ambulatory Visit (INDEPENDENT_AMBULATORY_CARE_PROVIDER_SITE_OTHER): Payer: Medicare Other | Admitting: Obstetrics and Gynecology

## 2017-05-16 VITALS — BP 110/60 | HR 54 | Resp 16 | Ht 63.25 in | Wt 155.0 lb

## 2017-05-16 DIAGNOSIS — Z01419 Encounter for gynecological examination (general) (routine) without abnormal findings: Secondary | ICD-10-CM | POA: Diagnosis not present

## 2017-05-16 DIAGNOSIS — N3941 Urge incontinence: Secondary | ICD-10-CM

## 2017-05-16 DIAGNOSIS — M8580 Other specified disorders of bone density and structure, unspecified site: Secondary | ICD-10-CM

## 2017-05-16 NOTE — Patient Instructions (Addendum)
Kegel Exercises Kegel exercises help strengthen the muscles that support the rectum, vagina, small intestine, bladder, and uterus. Doing Kegel exercises can help:  Improve bladder and bowel control.  Improve sexual response.  Reduce problems and discomfort during pregnancy.  Kegel exercises involve squeezing your pelvic floor muscles, which are the same muscles you squeeze when you try to stop the flow of urine. The exercises can be done while sitting, standing, or lying down, but it is best to vary your position. Phase 1 exercises 1. Squeeze your pelvic floor muscles tight. You should feel a tight lift in your rectal area. If you are a female, you should also feel a tightness in your vaginal area. Keep your stomach, buttocks, and legs relaxed. 2. Hold the muscles tight for up to 10 seconds. 3. Relax your muscles. Repeat this exercise 50 times a day or as many times as told by your health care provider. Continue to do this exercise for at least 4-6 weeks or for as long as told by your health care provider. This information is not intended to replace advice given to you by your health care provider. Make sure you discuss any questions you have with your health care provider. Document Released: 12/26/2011 Document Revised: 09/03/2015 Document Reviewed: 11/28/2014 Elsevier Interactive Patient Education  2018 [employer]  EXERCISE AND DIET:  We recommended that you start or continue a regular exercise program for good health. Regular exercise means any activity that makes your heart beat faster and makes you sweat.  We recommend exercising at least 30 minutes per day at least 3 days a week, preferably 4 or 5.  We also recommend a diet low in fat and sugar.  Inactivity, poor dietary choices and obesity can cause diabetes, heart attack, stroke, and kidney damage, among others.    ALCOHOL AND SMOKING:  Women should limit their alcohol intake to no more than 7 drinks/beers/glasses of wine (combined,  not each!) per week. Moderation of alcohol intake to this level decreases your risk of breast cancer and liver damage. And of course, no recreational drugs are part of a healthy lifestyle.  And absolutely no smoking or even second hand smoke. Most people know smoking can cause heart and lung diseases, but did you know it also contributes to weakening of your bones? Aging of your skin?  Yellowing of your teeth and nails?  CALCIUM AND VITAMIN D:  Adequate intake of calcium and Vitamin D are recommended.  The recommendations for exact amounts of these supplements seem to change often, but generally speaking 600 mg of calcium (either carbonate or citrate) and 800 units of Vitamin D per day seems prudent. Certain women may benefit from higher intake of Vitamin D.  If you are among these women, your doctor will have told you during your visit.    PAP SMEARS:  Pap smears, to check for cervical cancer or precancers,  have traditionally been done yearly, although recent scientific advances have shown that most women can have pap smears less often.  However, every woman still should have a physical exam from her gynecologist every year. It will include a breast check, inspection of the vulva and vagina to check for abnormal growths or skin changes, a visual exam of the cervix, and then an exam to evaluate the size and shape of the uterus and ovaries.  And after 79 years of age, a rectal exam is indicated to check for rectal cancers. We will also provide age appropriate advice regarding health maintenance,   like when you should have certain vaccines, screening for sexually transmitted diseases, bone density testing, colonoscopy, mammograms, etc.   MAMMOGRAMS:  All women over 40 years old should have a yearly mammogram. Many facilities now offer a "3D" mammogram, which may cost around $50 extra out of pocket. If possible,  we recommend you accept the option to have the 3D mammogram performed.  It both reduces the number of  women who will be called back for extra views which then turn out to be normal, and it is better than the routine mammogram at detecting truly abnormal areas.    COLONOSCOPY:  Colonoscopy to screen for colon cancer is recommended for all women at age 50.  We know, you hate the idea of the prep.  We agree, BUT, having colon cancer and not knowing it is worse!!  Colon cancer so often starts as a polyp that can be seen and removed at colonscopy, which can quite literally save your life!  And if your first colonoscopy is normal and you have no family history of colon cancer, most women don't have to have it again for 10 years.  Once every ten years, you can do something that may end up saving your life, right?  We will be happy to help you get it scheduled when you are ready.  Be sure to check your insurance coverage so you understand how much it will cost.  It may be covered as a preventative service at no cost, but you should check your particular policy.      

## 2017-05-16 NOTE — Progress Notes (Signed)
77 y.o. [id number] WidowedCaucasianF here for annual exam.   She has a h/o vulvar epidermoid cysts. Gets irritated. Feels the heated seats are worsening them. She has some external burning, irritation. Uses destin which helps. Not itching. No vaginal d/c. Just irritated where the bumps are. No desire to be sexually active.     Patient's last menstrual period was 01/23/1988 (approximate).          Sexually active: No.  The current method of family planning is post menopausal status.    Exercising: Yes.    yoga Smoker:  no  Health Maintenance: Pap:  04/12/15 neg   11/05/2008 Neg  History of abnormal Pap:  no MMG:  10/31/15 BIRADS2:Benign. Had one summer 2018 at [facility] Colonoscopy:  04/2017. Normal. Couldn't finish it secondary to her heart racing. She is going to have an imaging study.  BMD:   11/01/16 osteopenia FRAX 10 year risk of hip fracture is 3.8% and of any fracture is 14%.  In the past she tried fosamax, but it worsened her gastric reflux.  Currently on Prolia.  TDaP:  2016 Gardasil: n/a   reports that she quit smoking about 49 years ago. Her smoking use included cigarettes. She started smoking about 60 years ago. She has a 3.00 pack-year smoking history. She has never used smokeless tobacco. She reports that she drinks alcohol. She reports that she does not use drugs. she has 3-4 drinks a month. 2 kids, both local. 5 grand kids.  Her son has an autoimmune neurologic disease, has gone down hill. Getting dialysis, using a walker.   Past Medical History:  Diagnosis Date  . Atrial fibrillation ([place]) 09/2009   on coumadin  . Diverticulosis 09/2006  . GERD (gastroesophageal reflux disease)   . Hiatal hernia   . Hyperlipidemia   . Hypertension   . Osteopenia   . Postmenopausal hormone replacement therapy 1989 - 07/2000    Past Surgical History:  Procedure Laterality Date  . 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 BREAST SURGERY Left 4/99   breast biopsy - fibrosis  . CESAREAN SECTION     X 2  . HYSTEROSCOPY  4/95   w/D&C    Current Outpatient Medications  Medication Sig Dispense Refill  . amLODipine (NORVASC) 5 MG tablet Take 1 tablet (5 mg total) by mouth daily. 90 tablet 0  . apixaban (ELIQUIS) 5 MG TABS tablet Take 1 tablet (5 mg total) by mouth 2 (two) times daily. 60 tablet 0  . aspirin 81 MG chewable tablet Chew 81 mg by mouth daily.    . Calcium Carbonate (CALTRATE 600 PO) Take 1 capsule by mouth 2 (two) times daily. Chocolate chew    . denosumab (PROLIA) 60 MG/ML SOLN injection Inject 60 mg into the skin every 6 (six) months. Administer in upper arm, thigh, or abdomen 1 Syringe 1  . flecainide (TAMBOCOR) 100 MG tablet TAKE 1 TABLET (100 MG TOTAL) BY MOUTH 2 (TWO) TIMES DAILY. 60 tablet 11  . metoprolol succinate (TOPROL-XL) 50 MG 24 hr tablet TAKE 1 TABLET (50 MG TOTAL) BY MOUTH DAILY. 90 tablet 3  . Omega-3 Fatty Acids (OMEGA-3 PO) Take 3 tablets by mouth daily.     Marland Kitchen omeprazole (PRILOSEC) 20 MG capsule Take 20 mg by mouth as needed.    . quinapril (ACCUPRIL) 40 MG tablet Take 40 mg  by mouth at bedtime.    . simvastatin (ZOCOR) 20 MG tablet Take 1 tablet by mouth daily.    Marland Kitchen tretinoin (RETIN-A) 0.1 % cream APPLY PEA SIZED AMOUNT TO FACE AT NIGHT AS DIRECTED  2   No current facility-administered medications for this visit.     Family History  Problem Relation Age of Onset  . Hypertension Mother   . Heart disease Father   . COPD Father   . Diabetes Maternal Grandfather   . Diabetes Son   . Rheum arthritis Brother   . Kidney cancer Brother   . Breast cancer Maternal Grandmother   . Diabetes Maternal Aunt   . Diabetes Maternal Aunt     Review of Systems  Constitutional: Negative.   HENT: Negative.   Eyes: Negative.   Respiratory: Negative.   Cardiovascular: Negative.   Gastrointestinal: Negative.    Endocrine: Negative.   Genitourinary:       Urinary leakage  Vaginal lump   Musculoskeletal: Negative.   Skin: Negative.   Allergic/Immunologic: Negative.   Neurological: Negative.   Hematological: Negative.   Psychiatric/Behavioral: Negative.   She can dribble some urine on the way to the bathroom. Symptoms present for 2-3 years  Exam:   BP 110/60 (BP Location: Right Arm, Patient Position: Sitting, Cuff Size: Normal)   Pulse (!) 54   Resp 16   Ht 5' 3.25" (1.607 m)   Wt 155 lb (70.3 kg)   LMP 01/23/1988 (Approximate)   BMI 27.24 kg/m   Weight change: @WEIGHTCHANGE @ Height:   Height: 5' 3.25" (160.7 cm)  Ht Readings from Last 3 Encounters:  05/16/17 5' 3.25" (1.607 m)  01/23/17 5' 3.5" (1.613 m)  06/28/16 5' 3.5" (1.613 m)    General appearance: alert, cooperative and appears stated age Head: Normocephalic, without obvious abnormality, atraumatic Neck: no adenopathy, supple, symmetrical, trachea midline and thyroid normal to inspection and palpation Lungs: clear to auscultation bilaterally Cardiovascular: regular rate and rhythm Breasts: normal appearance, no masses or tenderness Abdomen: soft, non-tender; non distended,  no masses,  no organomegaly Extremities: extremities normal, atraumatic, no cyanosis or edema Skin: Skin color, texture, turgor normal. No rashes or lesions Lymph nodes: Cervical, supraclavicular, and axillary nodes normal. No abnormal inguinal nodes palpated Neurologic: Grossly normal   Pelvic: External genitalia:  no lesions, few epidermoid cysts, no erythema, no whitening, no fissure              Urethra:  normal appearing urethra with no masses, tenderness or lesions              Bartholins and Skenes: normal                 Vagina: normal appearing vagina with normal color and discharge, no lesions              Cervix: no lesions               Bimanual Exam:  Uterus:  normal size, contour, position, consistency, mobility, non-tender               Adnexa: no mass, fullness, tenderness               Rectovaginal: Confirms               Anus:  normal sphincter tone, no lesions  Chaperone was present for exam.  A:  Well Woman with normal exam  Urge incontinence  Osteopenia, on prolia   Intermittent Vulvar irritation, she has  a few epidermoid cysts, I don't think they are causing irritation. Leakage could cause irritation  P:   No pap this year  Urine for ua, C&S  Kegel information given  Labs with primary  On Prolia, next DEXA in 10/20  Discussed breast self exam  Discussed calcium and vit D intake  Discussed vulvar skin care

## 2017-05-17 LAB — URINALYSIS, MICROSCOPIC ONLY: Casts: NONE SEEN /lpf

## 2017-05-19 LAB — URINE CULTURE

## 2017-05-20 ENCOUNTER — Telehealth: Payer: Self-pay | Admitting: *Deleted

## 2017-05-20 MED ORDER — NITROFURANTOIN MONOHYD MACRO 100 MG PO CAPS
100.0000 mg | ORAL_CAPSULE | Freq: Two times a day (BID) | ORAL | 0 refills | Status: AC
Start: 2017-05-20 — End: 2017-05-25

## 2017-05-20 NOTE — Telephone Encounter (Signed)
Patient VM is full- will try back later -eh

## 2017-05-20 NOTE — Telephone Encounter (Signed)
Spoke with patient. Advised as seen below per Dr. Talbert Nan. Rx for Macrobid to [facility] on file. Patient verbalizes understanding. Will close encounter.

## 2017-05-20 NOTE — Telephone Encounter (Signed)
-----   Message from Anna Dom, MD sent at 05/20/2017 11:48 AM EDT ----- Please inform the patient that she does have a UTI and treat her with macrobid 100 mg bid x 5 days.

## 2017-05-20 NOTE — Telephone Encounter (Signed)
Patient returning Elaine's call. °

## 2017-05-20 NOTE — Telephone Encounter (Signed)
Attempted to reach patient at number provided, [phone] there was no answer and recording states that the voicemail box is full.

## 2017-05-23 ENCOUNTER — Other Ambulatory Visit: Payer: Self-pay | Admitting: Interventional Cardiology

## 2017-05-28 ENCOUNTER — Other Ambulatory Visit: Payer: Self-pay | Admitting: Interventional Cardiology

## 2017-05-28 ENCOUNTER — Ambulatory Visit
Admission: RE | Admit: 2017-05-28 | Discharge: 2017-05-28 | Disposition: A | Discharge status: Home / Self Care | Payer: Medicare Other | Source: Ambulatory Visit | Attending: Gastroenterology | Admitting: Gastroenterology

## 2017-05-28 DIAGNOSIS — Z1211 Encounter for screening for malignant neoplasm of colon: Secondary | ICD-10-CM

## 2017-05-28 NOTE — Telephone Encounter (Signed)
Pt last saw Dr Irish Lack 06/28/16, last labs 01/18/17 Creat 0.85, age 77, weight 70.3kg, CrCl 60.54, based on CrCl pt is on appropriate dosage of Xarelto 20mg  QD.  Will refill rx.

## 2017-06-06 ENCOUNTER — Telehealth: Payer: Self-pay | Admitting: Obstetrics and Gynecology

## 2017-06-06 NOTE — Telephone Encounter (Signed)
Patient calling to schedule next prolia injection, is unsure of when it is due.

## 2017-06-06 NOTE — Telephone Encounter (Signed)
Spoke with patient. First prolia inj received on 01/23/17, next due on or after 07/22/17.   Advised patient she has 1 remaining refill of prolia. Instructed patient to contact specialty pharmacy, [facility] at [phone], to request refill. Once refill requested and authorized to ship to [facility], our office will be contacted by [facility] to schedule delivery to office Once delivery date scheduled will schedule injection.   Patient verbalizes understanding.  Routing to provider for final review. Patient is agreeable to disposition. Will close encounter.

## 2017-06-10 ENCOUNTER — Encounter: Payer: Self-pay | Admitting: Physician Assistant

## 2017-06-20 ENCOUNTER — Encounter (INDEPENDENT_AMBULATORY_CARE_PROVIDER_SITE_OTHER): Payer: Self-pay

## 2017-06-20 ENCOUNTER — Ambulatory Visit (INDEPENDENT_AMBULATORY_CARE_PROVIDER_SITE_OTHER): Payer: Medicare Other

## 2017-06-20 DIAGNOSIS — I4891 Unspecified atrial fibrillation: Secondary | ICD-10-CM

## 2017-06-20 DIAGNOSIS — Z5181 Encounter for therapeutic drug level monitoring: Secondary | ICD-10-CM | POA: Diagnosis not present

## 2017-06-20 NOTE — Patient Instructions (Signed)
A full discussion of the nature of anticoagulants has been carried out.  A benefit/risk analysis has been presented to the patient, so that they understand the justification for choosing anticoagulation with Eliquis at this time.  The need for compliance is stressed.  Pt is aware to take the medication twice daily.  Side effects of potential bleeding are discussed, including unusual colored urine or stools, coughing up blood or coffee ground emesis, nose bleeds or serious fall or head trauma.  Discussed signs and symptoms of stroke. The patient should avoid any OTC items containing aspirin or ibuprofen.  Avoid alcohol consumption.   Call if any signs of abnormal bleeding.  Discussed financial obligations and resolved any difficulty in obtaining medication.  Next lab test in 6 months.

## 2017-06-20 NOTE — Progress Notes (Signed)
Pt was started on Eliquis 5mg  BID for afib on 12/21/16 by Dr Irish Lack.    Reviewed patients medication list.  Pt is not currently on any combined P-gp and strong CYP3A4 inhibitors/inducers (ketoconazole, traconazole, ritonavir, carbamazepine, phenytoin, rifampin, St. John's wort).  Reviewed labs: SCr-0.85, Hgb-13.5, HCT-39.4, Weight-70.3kg.  Dose appropriate based on age, weight, and SCr.  Hgb and HCT within normal limits.   A full discussion of the nature of anticoagulants has been carried out.  A benefit/risk analysis has been presented to the patient, so that they understand the justification for choosing anticoagulation with Eliquis at this time.  The need for compliance is stressed.  Pt is aware to take the medication twice daily.  Side effects of potential bleeding are discussed, including unusual colored urine or stools, coughing up blood or coffee ground emesis, nose bleeds or serious fall or head trauma.  Discussed signs and symptoms of stroke. The patient should avoid any OTC items containing aspirin or ibuprofen.  Avoid alcohol consumption.   Call if any signs of abnormal bleeding.  Discussed financial obligations and resolved any difficulty in obtaining medication.    06/21/17-spoke with pt and advised to continue taking Eliquis 5mg  twice a day. Pt advised to call with any issues and she verbalized understanding. Set pt an appt in 6 months over the phone.

## 2017-06-21 LAB — BASIC METABOLIC PANEL
BUN/Creatinine Ratio: 31 — ABNORMAL HIGH (ref 12–28)
BUN: 26 mg/dL (ref 8–27)
CO2: 26 mmol/L (ref 20–29)
Calcium: 9.5 mg/dL (ref 8.7–10.3)
Chloride: 105 mmol/L (ref 96–106)
Creatinine, Ser: 0.85 mg/dL (ref 0.57–1.00)
GFR calc Af Amer: 76 mL/min/{1.73_m2} (ref >59)
GFR calc non Af Amer: 66 mL/min/{1.73_m2} (ref >59)
Glucose: 108 mg/dL — ABNORMAL HIGH (ref 65–99)
Potassium: 4.4 mmol/L (ref 3.5–5.2)
Sodium: 144 mmol/L (ref 134–144)

## 2017-06-21 LAB — CBC
Hematocrit: 39.4 % (ref 34.0–46.6)
Hemoglobin: 13.5 g/dL (ref 11.1–15.9)
MCH: 31 pg (ref 26.6–33.0)
MCHC: 34.3 g/dL (ref 31.5–35.7)
MCV: 90 fL (ref 79–97)
Platelets: 209 10*3/uL (ref 150–450)
RBC: 4.36 x10E6/uL (ref 3.77–5.28)
RDW: 14.1 % (ref 12.3–15.4)
WBC: 6.9 10*3/uL (ref 3.4–10.8)

## 2017-06-24 ENCOUNTER — Telehealth: Payer: Self-pay | Admitting: Obstetrics and Gynecology

## 2017-06-24 NOTE — Telephone Encounter (Signed)
The patient is ready to schedule her prolia injection. The patient asked "Do I need to do anything in order to schedule this injection?" .

## 2017-06-24 NOTE — Telephone Encounter (Signed)
Left message to call Anna Mathews at [phone]. 

## 2017-06-25 NOTE — Telephone Encounter (Signed)
Returning a call to Kaitlyn. °

## 2017-06-26 NOTE — Telephone Encounter (Signed)
Spoke with patient. Advised next Prolia injection is due 07/22/2017 or after. Will need to contact [facility] to discuss delivery.  Spoke with Delcie Roch at [facility] who states rx cannot be ordered at this time as it is too early to fill.  Spoke with patient who verbalizes understanding and will contact the office in late June for assistance with obtaining Prolia.  Routing to provider for final review. Patient agreeable to disposition. Will close encounter.

## 2017-06-27 ENCOUNTER — Other Ambulatory Visit: Payer: Self-pay | Admitting: Interventional Cardiology

## 2017-07-01 DIAGNOSIS — E785 Hyperlipidemia, unspecified: Secondary | ICD-10-CM | POA: Insufficient documentation

## 2017-07-01 NOTE — Progress Notes (Addendum)
Cardiology Office Note    Date:  07/02/2017   ID:  Anna Mathews, DOB 17-Dec-1940, MRN [id number]  PCP:  Anna Shanks, MD  Cardiologist: Larae Grooms, MD  Chief Complaint  Patient presents with  . Follow-up    History of Present Illness:  Anna Mathews is a 77 y.o. female with history of atrial fibrillation maintaining normal sinus rhythm on flecainide, also on Eliquis for CHA2DS2-VASc of 4, hypertension, HLD, GERD.  Last saw Dr. Irish Mathews for 06/28/2016 which time she was having dyspnea on exertion felt secondary to cutting back on her exercise due to shoulder injury.  Patient comes in today for f/u. Not feeling well for 1-2 months. Had trouble with colonoscopy because of heart fluttering. More short of breath with activity especially going up and down stairs and walking dog. Also has had chest pain, shoulder and neck pain when she moves. She thinks it's secondary to exercises-yoga and light weights.      Past Medical History:  Diagnosis Date  . Atrial fibrillation ([place]) 09/2009   on coumadin  . Diverticulosis 09/2006  . GERD (gastroesophageal reflux disease)   . Hiatal hernia   . Hyperlipidemia   . Hypertension   . Osteopenia   . Postmenopausal hormone replacement therapy 1989 - 07/2000    Past Surgical History:  Procedure Laterality Date  . 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 BREAST SURGERY Left 4/99   breast biopsy - fibrosis  . CESAREAN SECTION     X 2  . HYSTEROSCOPY  4/95   w/D&C    Current Medications: Current Meds  Medication Sig  . amLODipine (NORVASC) 5 MG tablet Take 1 tablet (5 mg total) by mouth daily.  Marland Kitchen aspirin 81 MG chewable tablet Chew 81 mg by mouth daily.  . Calcium Carbonate (CALTRATE 600 PO) Take 1 capsule by mouth 2 (two) times daily. Chocolate chew  . denosumab (PROLIA) 60 MG/ML SOLN  injection Inject 60 mg into the skin every 6 (six) months. Administer in upper arm, thigh, or abdomen  . ELIQUIS 5 MG TABS tablet TAKE 1 TABLET BY MOUTH TWICE A DAY  . flecainide (TAMBOCOR) 100 MG tablet Take 100 mg by mouth 2 (two) times daily.  . metoprolol succinate (TOPROL-XL) 50 MG 24 hr tablet TAKE 1 TABLET (50 MG TOTAL) BY MOUTH DAILY.  Marland Kitchen Omega-3 Fatty Acids (OMEGA-3 PO) Take 3 tablets by mouth daily.   Marland Kitchen omeprazole (PRILOSEC) 20 MG capsule Take 20 mg by mouth as needed.  . quinapril (ACCUPRIL) 40 MG tablet Take 40 mg by mouth at bedtime.  . simvastatin (ZOCOR) 20 MG tablet Take 1 tablet by mouth daily.  Marland Kitchen tretinoin (RETIN-A) 0.1 % cream APPLY PEA SIZED AMOUNT TO FACE AT NIGHT AS DIRECTED     Allergies:   Penicillins and Sulfa antibiotics   Social History   Socioeconomic History  . Marital status: Widowed    Spouse name: Not on file  . Number of children: 2  . Years of education: Not on file  . Highest education level: Not on file  Occupational History  . Not on file  Social Needs  . Financial resource strain: Not on file  . Food insecurity:    Worry: Not on file    Inability: Not on file  . Transportation needs:    Medical: Not on file  Non-medical: Not on file  Tobacco Use  . Smoking status: Former Smoker    Packs/day: 0.25    Years: 12.00    Pack years: 3.00    Types: Cigarettes    Start date: 01/16/1957    Last attempt to quit: 01/23/1968    Years since quitting: 49.4  . Smokeless tobacco: Never Used  Substance and Sexual Activity  . Alcohol use: Yes    Comment: social  . Drug use: No  . Sexual activity: Not Currently    Partners: Male    Birth control/protection: Post-menopausal  Lifestyle  . Physical activity:    Days per week: Not on file    Minutes per session: Not on file  . Stress: Not on file  Relationships  . Social connections:    Talks on phone: Not on file    Gets together: Not on file    Attends religious service: Not on file    Active  member of club or organization: Not on file    Attends meetings of clubs or organizations: Not on file    Relationship status: Not on file  Other Topics Concern  . Not on file  Social History Narrative  . Not on file     Family History:  [name] family history includes Breast cancer in her maternal grandmother; COPD in her father; Diabetes in her maternal aunt, maternal aunt, maternal grandfather, and son; Heart disease in her father; Hypertension in her mother; Kidney cancer in her brother; Rheum arthritis in her brother.   ROS:   Please see the history of present illness.    Review of Systems  Constitution: Negative.  HENT: Negative.   Eyes: Negative.   Cardiovascular: Positive for chest pain, dyspnea on exertion and irregular heartbeat.  Respiratory: Negative.   Hematologic/Lymphatic: Negative.   Musculoskeletal: Positive for muscle cramps. Negative for joint pain.  Gastrointestinal: Negative.   Genitourinary: Negative.   Neurological: Negative.    All other systems reviewed and are negative.   PHYSICAL EXAM:   VS:  BP 132/72   Pulse (!) 59   Ht 5\' 4"  (1.626 m)   Wt 154 lb 12.8 oz (70.2 kg)   LMP 01/23/1988 (Approximate)   SpO2 97%   BMI 26.57 kg/m   Physical Exam  GEN: Well nourished, well developed, in no acute distress  Neck: no JVD, carotid bruits, or masses Cardiac:RRR; no murmurs, rubs, or gallops  Respiratory:  clear to auscultation bilaterally, normal work of breathing GI: soft, nontender, nondistended, + BS Ext: without cyanosis, clubbing, or edema, Good distal pulses bilaterally Neuro:  Alert and Oriented x 3 Psych: euthymic mood, full affect  Wt Readings from Last 3 Encounters:  07/02/17 154 lb 12.8 oz (70.2 kg)  05/16/17 155 lb (70.3 kg)  01/23/17 159 lb (72.1 kg)      Studies/Labs Reviewed:   EKG:  EKG is  ordered today.  The ekg ordered today demonstrates atrial flutter at 59 bpm  Recent Labs: 12/24/2016: ALT 22 06/20/2017: BUN 26;  Creatinine, Ser 0.85; Hemoglobin 13.5; Platelets 209; Potassium 4.4; Sodium 144   Lipid Panel No results found for: CHOL, TRIG, HDL, CHOLHDL, VLDL, LDLCALC, LDLDIRECT  Additional studies/ records that were reviewed today include:  2D echo 2014 LVEF 60 to 65%, mild MR, aortic valve is sclerotic but opens well, impaired relaxation consistent with age.    ASSESSMENT:    1. PAF (paroxysmal atrial fibrillation) ([place])   2. Essential hypertension   3. Mixed hyperlipidemia  4. Chest pain, unspecified type      PLAN:  In order of problems listed above:  PAF on flecainide and Eliquis started 11/2016 previously on Coumadin.  Back in a flutter with associated dyspnea on exertion.  Discussed with Dr. Irish Mathews who spoke with patient as well and recommends referral to EPS for possible consideration of ablation.  Will check flecainide level as well.  Recent labs have been stable with creatinine of 0.85 on 06/20/2017.  Essential hypertension blood pressure controlled  Mixed hyperlipidemia on Zocor LDL 82 05/30/2017  Chest pain sounds more musculoskeletal related to movement.  No chest pain with walking.    Medication Adjustments/Labs and Tests Ordered: Current medicines are reviewed at length with the patient today.  Concerns regarding medicines are outlined above.  Medication changes, Labs and Tests ordered today are listed in the Patient Instructions below. There are no Patient Instructions on file for this visit.   Sumner Boast, PA-C  07/02/2017 11:00 AM    [facility] [place], [place], [place]  [address] Phone: [phone]; Fax: [phone]    I have examined the patient and reviewed assessment and plan and discussed with patient.  Agree with above as stated.  Patient now in what appears to be a slow atrial flutter with controlled ventricular response. Patient feels poorly despite controlled heart rate.  Will refer to Dr. Rayann Heman or Dr. Curt Bears for  possible ablation vs. Medication change.  It appears that rate control as a strategy will be inadequate.  CONtinue Eliquis for stroke prevention.   Larae Grooms

## 2017-07-02 ENCOUNTER — Encounter

## 2017-07-02 ENCOUNTER — Encounter: Payer: Self-pay | Admitting: Obstetrics and Gynecology

## 2017-07-02 ENCOUNTER — Ambulatory Visit: Payer: Medicare Other | Admitting: Physician Assistant

## 2017-07-02 ENCOUNTER — Encounter: Payer: Self-pay | Admitting: Physician Assistant

## 2017-07-02 VITALS — BP 132/72 | HR 59 | Ht 64.0 in | Wt 154.8 lb

## 2017-07-02 DIAGNOSIS — I10 Essential (primary) hypertension: Secondary | ICD-10-CM

## 2017-07-02 DIAGNOSIS — E782 Mixed hyperlipidemia: Secondary | ICD-10-CM

## 2017-07-02 DIAGNOSIS — R079 Chest pain, unspecified: Secondary | ICD-10-CM | POA: Diagnosis not present

## 2017-07-02 DIAGNOSIS — I480 Paroxysmal atrial fibrillation: Secondary | ICD-10-CM | POA: Diagnosis not present

## 2017-07-02 NOTE — Patient Instructions (Addendum)
Medication Instructions:  Your physician recommends that you continue on your current medications as directed. Please refer to the Current Medication list given to you today.   Labwork: TODAY:  FLECAINIDE LEVEL  Testing/Procedures: None ordered  Follow-Up: Your physician recommends that you schedule a follow-up appointment in: 07/12/17 ARRIVE AT 9:45 TO SEE DR. Curt Bears     Any Other Special Instructions Will Be Listed Below (If Applicable).     If you need a refill on your cardiac medications before your next appointment, please call your pharmacy.

## 2017-07-04 ENCOUNTER — Telehealth: Payer: Self-pay | Admitting: Interventional Cardiology

## 2017-07-04 LAB — FLECAINIDE LEVEL: Flecainide: 0.95 ug/mL (ref 0.20–1.00)

## 2017-07-04 NOTE — Telephone Encounter (Signed)
Returned call to patient. Made patient aware of lab results below. Instructed the patient to continue taking that flecainide as well as Eliquis and keep her appointment with Dr. Curt Bears on 6/21 at 10:00 AM to discuss treatment options for her AF, ie. Ablation versus medication management. Patient verbalized understanding and thanked me for the call.      Notes recorded by Imogene Burn, PA-C on 07/04/2017 at 7:42 AM EDT Flecainide level normal. No changes

## 2017-07-04 NOTE — Telephone Encounter (Signed)
New Message:       Pt is calling to get some clarification on some lab results she received

## 2017-07-10 ENCOUNTER — Telehealth: Payer: Self-pay | Admitting: Obstetrics and Gynecology

## 2017-07-10 NOTE — Telephone Encounter (Signed)
Patient stated that she is supposed to get a bone density shot sometime in July and was calling for [place] to order it and schedule the appointment.

## 2017-07-11 NOTE — Telephone Encounter (Signed)
Per review of Epic, next prolia due after 07/22/17. 1 Refill of Prolia remaining. Patient to contact [facility] to request refill and authorize shipment to office.   Call returned to patient, advised as seen above. Provided patient will [facility] contact information. Advised our office will await return call from [place] to schedule delivery of Prolia. Patient verbalizes understanding.

## 2017-07-12 ENCOUNTER — Ambulatory Visit: Payer: Medicare Other | Admitting: Cardiology

## 2017-07-12 ENCOUNTER — Encounter: Payer: Self-pay | Admitting: Cardiology

## 2017-07-12 VITALS — BP 112/76 | HR 61 | Ht 64.0 in | Wt 153.4 lb

## 2017-07-12 DIAGNOSIS — Z01812 Encounter for preprocedural laboratory examination: Secondary | ICD-10-CM

## 2017-07-12 DIAGNOSIS — I481 Persistent atrial fibrillation: Secondary | ICD-10-CM | POA: Diagnosis not present

## 2017-07-12 DIAGNOSIS — I10 Essential (primary) hypertension: Secondary | ICD-10-CM | POA: Diagnosis not present

## 2017-07-12 DIAGNOSIS — E782 Mixed hyperlipidemia: Secondary | ICD-10-CM

## 2017-07-12 DIAGNOSIS — I483 Typical atrial flutter: Secondary | ICD-10-CM

## 2017-07-12 DIAGNOSIS — I4819 Other persistent atrial fibrillation: Secondary | ICD-10-CM

## 2017-07-12 LAB — CBC
Hematocrit: 43.7 % (ref 34.0–46.6)
Hemoglobin: 14.9 g/dL (ref 11.1–15.9)
MCH: 31.2 pg (ref 26.6–33.0)
MCHC: 34.1 g/dL (ref 31.5–35.7)
MCV: 91 fL (ref 79–97)
Platelets: 242 10*3/uL (ref 150–450)
RBC: 4.78 x10E6/uL (ref 3.77–5.28)
RDW: 13.9 % (ref 12.3–15.4)
WBC: 7.4 10*3/uL (ref 3.4–10.8)

## 2017-07-12 LAB — BASIC METABOLIC PANEL
BUN/Creatinine Ratio: 20 (ref 12–28)
BUN: 22 mg/dL (ref 8–27)
CO2: 25 mmol/L (ref 20–29)
Calcium: 10 mg/dL (ref 8.7–10.3)
Chloride: 102 mmol/L (ref 96–106)
Creatinine, Ser: 1.11 mg/dL — ABNORMAL HIGH (ref 0.57–1.00)
GFR calc Af Amer: 55 mL/min/{1.73_m2} — ABNORMAL LOW (ref >59)
GFR calc non Af Amer: 48 mL/min/{1.73_m2} — ABNORMAL LOW (ref >59)
Glucose: 103 mg/dL — ABNORMAL HIGH (ref 65–99)
Potassium: 4.3 mmol/L (ref 3.5–5.2)
Sodium: 143 mmol/L (ref 134–144)

## 2017-07-12 NOTE — H&P (View-Only) (Signed)
Electrophysiology Office Note   Date:  07/12/2017   ID:  Anna Mathews, DOB 12/20/1940, MRN [id number]  PCP:  Vernie Shanks, MD  Cardiologist:  Casandra Doffing Primary Electrophysiologist:  Ayona Yniguez Meredith Leeds, MD    Chief Complaint  Patient presents with  . Advice Only    PAF     History of Present Illness: Anna Mathews is a 77 y.o. female who is being seen today for the evaluation of atrial fibrillation at the request of Ermalinda Barrios. Presenting today for electrophysiology evaluation.  She has a history of atrial fibrillation on flecainide, hypertension, hyperlipidemia.  She has not been feeling well for the last few months.  She had a colonoscopy about 6 weeks ago and she says that she went into atrial fibrillation at that time.  She has been more short of breath with activity such as going up and down stairs and walking her dog.  She also has quite a bit of fatigue.  She complains of palpitations when she lies on her left side.   Today, she denies symptoms of chest pain, orthopnea, PND, lower extremity edema, claudication, dizziness, presyncope, syncope, bleeding, or neurologic sequela. The patient is tolerating medications without difficulties.    Past Medical History:  Diagnosis Date  . Atrial fibrillation ([place]) 09/2009   on coumadin  . Diverticulosis 09/2006  . GERD (gastroesophageal reflux disease)   . Hiatal hernia   . Hyperlipidemia   . Hypertension   . Osteopenia   . Postmenopausal hormone replacement therapy 1989 - 07/2000   Past Surgical History:  Procedure Laterality Date  . 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 BREAST SURGERY Left 4/99   breast biopsy - fibrosis  . CESAREAN SECTION     X 2  . HYSTEROSCOPY  4/95   w/D&C     Current Outpatient Medications  Medication Sig Dispense Refill  . amLODipine (NORVASC)  5 MG tablet Take 1 tablet (5 mg total) by mouth daily. 90 tablet 0  . aspirin 81 MG chewable tablet Chew 81 mg by mouth daily.    . Calcium Carbonate (CALTRATE 600 PO) Take 1 capsule by mouth 2 (two) times daily. Chocolate chew    . denosumab (PROLIA) 60 MG/ML SOLN injection Inject 60 mg into the skin every 6 (six) months. Administer in upper arm, thigh, or abdomen 1 Syringe 1  . ELIQUIS 5 MG TABS tablet TAKE 1 TABLET BY MOUTH TWICE A DAY 60 tablet 5  . flecainide (TAMBOCOR) 100 MG tablet Take 100 mg by mouth 2 (two) times daily.    . metoprolol succinate (TOPROL-XL) 50 MG 24 hr tablet TAKE 1 TABLET (50 MG TOTAL) BY MOUTH DAILY. 90 tablet 3  . Omega-3 Fatty Acids (OMEGA-3 PO) Take 3 tablets by mouth daily.     Marland Kitchen omeprazole (PRILOSEC) 20 MG capsule Take 20 mg by mouth as needed.    . quinapril (ACCUPRIL) 40 MG tablet Take 40 mg by mouth at bedtime.    . simvastatin (ZOCOR) 20 MG tablet Take 1 tablet by mouth daily.    Marland Kitchen tretinoin (RETIN-A) 0.1 % cream APPLY PEA SIZED AMOUNT TO FACE AT NIGHT AS DIRECTED  2   No current facility-administered medications for this visit.     Allergies:   Penicillins and Sulfa antibiotics   Social History:  The patient  reports  that she quit smoking about 49 years ago. Her smoking use included cigarettes. She started smoking about 60 years ago. She has a 3.00 pack-year smoking history. She has never used smokeless tobacco. She reports that she drinks alcohol. She reports that she does not use drugs.   Family History:  [name] family history includes Breast cancer in her maternal grandmother; COPD in her father; Diabetes in her maternal aunt, maternal aunt, maternal grandfather, and son; Heart disease in her father; Hypertension in her mother; Kidney cancer in her brother; Rheum arthritis in her brother.    ROS:  Please see the history of present illness.   Otherwise, review of systems is positive for shortness of breath, muscle pain, palpitations.   All other  systems are reviewed and negative.    PHYSICAL EXAM: VS:  BP 112/76   Pulse 61   Ht 5\' 4"  (1.626 m)   Wt 153 lb 6.4 oz (69.6 kg)   LMP 01/23/1988 (Approximate)   SpO2 98%   BMI 26.33 kg/m  , BMI Body mass index is 26.33 kg/m. GEN: Well nourished, well developed, in no acute distress  HEENT: normal  Neck: no JVD, carotid bruits, or masses Cardiac: iRRR; no murmurs, rubs, or gallops,no edema  Respiratory:  clear to auscultation bilaterally, normal work of breathing GI: soft, nontender, nondistended, + BS MS: no deformity or atrophy  Skin: warm and dry Neuro:  Strength and sensation are intact Psych: euthymic mood, full affect  EKG:  EKG is not ordered today. Personal review of the ekg ordered 07/02/17 shows atrial flutter, rate 59  Recent Labs: 12/24/2016: ALT 22 06/20/2017: BUN 26; Creatinine, Ser 0.85; Hemoglobin 13.5; Platelets 209; Potassium 4.4; Sodium 144    Lipid Panel  No results found for: CHOL, TRIG, HDL, CHOLHDL, VLDL, LDLCALC, LDLDIRECT   Wt Readings from Last 3 Encounters:  07/12/17 153 lb 6.4 oz (69.6 kg)  07/02/17 154 lb 12.8 oz (70.2 kg)  05/16/17 155 lb (70.3 kg)      Other studies Reviewed: Additional studies/ records that were reviewed today include: TTE 2014  Review of the above records today demonstrates:  LVEF 60 to 65%, mild MR, aortic valve is sclerotic but opens well, impaired relaxation consistent with age.    ASSESSMENT AND PLAN:  1.  Paroxysmal atrial fibrillation: Currently on flecainide and Eliquis.  She is unfortunately back in atrial flutter.  I did discuss with her options of ablation versus adjusting medications versus simply cardioversion.  At this point, she is hesitant to move forward with ablation and does not like the idea of other medications.  We Allena Pietila plan for cardioversion I Aaliyah Cancro see her back in 3 months.  Should she revert back to atrial fibrillation, I feel that ablation would be warranted.  This patients CHA2DS2-VASc Score  and unadjusted Ischemic Stroke Rate (% per year) is equal to 4.8 % stroke rate/year from a score of 4  Above score calculated as 1 point each if present [CHF, HTN, DM, Vascular=MI/PAD/Aortic Plaque, Age if 65-74, or Female] Above score calculated as 2 points each if present [Age > 75, or Stroke/TIA/TE]  2.  Hypertension: Currently well controlled.  No changes.  3.  Hyperlipidemia: Continue Zocor  Current medicines are reviewed at length with the patient today.   The patient does not have concerns regarding her medicines.  The following changes were made today:  none  Labs/ tests ordered today include:  Orders Placed This Encounter  Procedures  . Basic Metabolic Panel (  BMET)  . CBC   Case discussed with primary cardiology  Disposition:   FU with Brynnlee Cumpian 3 months  Signed, Minie Roadcap Meredith Leeds, MD  07/12/2017 10:45 AM     [facility] [address] [place] [address] [phone] (office) [phone] (fax)

## 2017-07-12 NOTE — Progress Notes (Signed)
Electrophysiology Office Note   Date:  07/12/2017   ID:  Anna Mathews, DOB [birth year], MRN [id number]  PCP:  Vernie Shanks, MD  Cardiologist:  Casandra Doffing Primary Electrophysiologist:  Will Meredith Leeds, MD    Chief Complaint  Patient presents with  . Advice Only    PAF     History of Present Illness: Anna Mathews is a 77 y.o. female who is being seen today for the evaluation of atrial fibrillation at the request of Ermalinda Barrios. Presenting today for electrophysiology evaluation.  She has a history of atrial fibrillation on flecainide, hypertension, hyperlipidemia.  She has not been feeling well for the last few months.  She had a colonoscopy about 6 weeks ago and she says that she went into atrial fibrillation at that time.  She has been more short of breath with activity such as going up and down stairs and walking her dog.  She also has quite a bit of fatigue.  She complains of palpitations when she lies on her left side.   Today, she denies symptoms of chest pain, orthopnea, PND, lower extremity edema, claudication, dizziness, presyncope, syncope, bleeding, or neurologic sequela. The patient is tolerating medications without difficulties.    Past Medical History:  Diagnosis Date  . Atrial fibrillation ([place]) 09/2009   on coumadin  . Diverticulosis 09/2006  . GERD (gastroesophageal reflux disease)   . Hiatal hernia   . Hyperlipidemia   . Hypertension   . Osteopenia   . Postmenopausal hormone replacement therapy 1989 - 07/2000   Past Surgical History:  Procedure Laterality Date  . 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 BREAST SURGERY Left 4/99   breast biopsy - fibrosis  . CESAREAN SECTION     X 2  . HYSTEROSCOPY  4/95   w/D&C     Current Outpatient Medications  Medication Sig Dispense Refill  . amLODipine (NORVASC)  5 MG tablet Take 1 tablet (5 mg total) by mouth daily. 90 tablet 0  . aspirin 81 MG chewable tablet Chew 81 mg by mouth daily.    . Calcium Carbonate (CALTRATE 600 PO) Take 1 capsule by mouth 2 (two) times daily. Chocolate chew    . denosumab (PROLIA) 60 MG/ML SOLN injection Inject 60 mg into the skin every 6 (six) months. Administer in upper arm, thigh, or abdomen 1 Syringe 1  . ELIQUIS 5 MG TABS tablet TAKE 1 TABLET BY MOUTH TWICE A DAY 60 tablet 5  . flecainide (TAMBOCOR) 100 MG tablet Take 100 mg by mouth 2 (two) times daily.    . metoprolol succinate (TOPROL-XL) 50 MG 24 hr tablet TAKE 1 TABLET (50 MG TOTAL) BY MOUTH DAILY. 90 tablet 3  . Omega-3 Fatty Acids (OMEGA-3 PO) Take 3 tablets by mouth daily.     Marland Kitchen omeprazole (PRILOSEC) 20 MG capsule Take 20 mg by mouth as needed.    . quinapril (ACCUPRIL) 40 MG tablet Take 40 mg by mouth at bedtime.    . simvastatin (ZOCOR) 20 MG tablet Take 1 tablet by mouth daily.    Marland Kitchen tretinoin (RETIN-A) 0.1 % cream APPLY PEA SIZED AMOUNT TO FACE AT NIGHT AS DIRECTED  2   No current facility-administered medications for this visit.     Allergies:   Penicillins and Sulfa antibiotics   Social History:  The patient  reports  that she quit smoking about 49 years ago. Her smoking use included cigarettes. She started smoking about 60 years ago. She has a 3.00 pack-year smoking history. She has never used smokeless tobacco. She reports that she drinks alcohol. She reports that she does not use drugs.   Family History:  [name] family history includes Breast cancer in her maternal grandmother; COPD in her father; Diabetes in her maternal aunt, maternal aunt, maternal grandfather, and son; Heart disease in her father; Hypertension in her mother; Kidney cancer in her brother; Rheum arthritis in her brother.    ROS:  Please see the history of present illness.   Otherwise, review of systems is positive for shortness of breath, muscle pain, palpitations.   All other  systems are reviewed and negative.    PHYSICAL EXAM: VS:  BP 112/76   Pulse 61   Ht 5\' 4"  (1.626 m)   Wt 153 lb 6.4 oz (69.6 kg)   LMP 01/23/1988 (Approximate)   SpO2 98%   BMI 26.33 kg/m  , BMI Body mass index is 26.33 kg/m. GEN: Well nourished, well developed, in no acute distress  HEENT: normal  Neck: no JVD, carotid bruits, or masses Cardiac: iRRR; no murmurs, rubs, or gallops,no edema  Respiratory:  clear to auscultation bilaterally, normal work of breathing GI: soft, nontender, nondistended, + BS MS: no deformity or atrophy  Skin: warm and dry Neuro:  Strength and sensation are intact Psych: euthymic mood, full affect  EKG:  EKG is not ordered today. Personal review of the ekg ordered 07/02/17 shows atrial flutter, rate 59  Recent Labs: 12/24/2016: ALT 22 06/20/2017: BUN 26; Creatinine, Ser 0.85; Hemoglobin 13.5; Platelets 209; Potassium 4.4; Sodium 144    Lipid Panel  No results found for: CHOL, TRIG, HDL, CHOLHDL, VLDL, LDLCALC, LDLDIRECT   Wt Readings from Last 3 Encounters:  07/12/17 153 lb 6.4 oz (69.6 kg)  07/02/17 154 lb 12.8 oz (70.2 kg)  05/16/17 155 lb (70.3 kg)      Other studies Reviewed: Additional studies/ records that were reviewed today include: TTE 2014  Review of the above records today demonstrates:  LVEF 60 to 65%, mild MR, aortic valve is sclerotic but opens well, impaired relaxation consistent with age.    ASSESSMENT AND PLAN:  1.  Paroxysmal atrial fibrillation: Currently on flecainide and Eliquis.  She is unfortunately back in atrial flutter.  I did discuss with her options of ablation versus adjusting medications versus simply cardioversion.  At this point, she is hesitant to move forward with ablation and does not like the idea of other medications.  We will plan for cardioversion I will see her back in 3 months.  Should she revert back to atrial fibrillation, I feel that ablation would be warranted.  This patients CHA2DS2-VASc Score  and unadjusted Ischemic Stroke Rate (% per year) is equal to 4.8 % stroke rate/year from a score of 4  Above score calculated as 1 point each if present [CHF, HTN, DM, Vascular=MI/PAD/Aortic Plaque, Age if 65-74, or Female] Above score calculated as 2 points each if present [Age > 75, or Stroke/TIA/TE]  2.  Hypertension: Currently well controlled.  No changes.  3.  Hyperlipidemia: Continue Zocor  Current medicines are reviewed at length with the patient today.   The patient does not have concerns regarding her medicines.  The following changes were made today:  none  Labs/ tests ordered today include:  Orders Placed This Encounter  Procedures  . Basic Metabolic Panel (  BMET)  . CBC   Case discussed with primary cardiology  Disposition:   FU with Will Camnitz 3 months  Signed, Will Meredith Leeds, MD  07/12/2017 10:45 AM     [facility] [address] [place] [address] [phone] (office) [phone] (fax)

## 2017-07-12 NOTE — Patient Instructions (Addendum)
Medication Instructions:  Your physician recommends that you continue on your current medications as directed. Please refer to the Current Medication list given to you today.  Labwork: Pre procedure lab work today: BMET & CBC      *We will only notify you of abnormal results, otherwise continue current treatment plan.  Testing/Procedures: Your physician has recommended that you have a Cardioversion (DCCV). Electrical Cardioversion uses a jolt of electricity to your heart either through paddles or wired patches attached to your chest. This is a controlled, usually prescheduled, procedure. Defibrillation is done under light anesthesia in the hospital, and you usually go home the day of the procedure. This is done to get your heart back into a normal rhythm. You are not awake for the procedure.  Cardioversion Instructions Please arrive at the [facility] main entrance of [facility] hospital on 07/16/2017 at  10:00 a.m. Do not eat or drink after midnight prior to procedure Take your medications as normal the morning of your procedure with a sip of water. You will need someone to drive you home at discharge   Follow-Up: Your physician recommends that you schedule a follow-up appointment in: 3 months with Dr. Curt Bears.   * If you need a refill on your cardiac medications before your next appointment, please call your pharmacy.   *Please note that any paperwork needing to be filled out by the provider will need to be addressed at the front desk prior to seeing the provider. Please note that any FMLA, disability or other documents regarding health condition is subject to a $25.00 charge that must be received prior to completion of paperwork in the form of a money order or check.  Thank you for choosing [facility]!!   Trinidad Curet, RN [phone]  Any Other Special Instructions Will Be Listed Below (If Applicable).   Electrical Cardioversion Electrical cardioversion is the delivery of a  jolt of electricity to restore a normal rhythm to the heart. A rhythm that is too fast or is not regular keeps the heart from pumping well. In this procedure, sticky patches or metal paddles are placed on the chest to deliver electricity to the heart from a device. This procedure may be done in an emergency if:  There is low or no blood pressure as a result of the heart rhythm.  Normal rhythm must be restored as fast as possible to protect the brain and heart from further damage.  It may save a life.  This procedure may also be done for irregular or fast heart rhythms that are not immediately life-threatening. Tell a health care provider about:  Any allergies you have.  All medicines you are taking, including vitamins, herbs, eye drops, creams, and over-the-counter medicines.  Any problems you or family members have had with anesthetic medicines.  Any blood disorders you have.  Any surgeries you have had.  Any medical conditions you have.  Whether you are pregnant or may be pregnant. What are the risks? Generally, this is a safe procedure. However, problems may occur, including:  Allergic reactions to medicines.  A blood clot that breaks free and travels to other parts of your body.  The possible return of an abnormal heart rhythm within hours or days after the procedure.  Your heart stopping (cardiac arrest). This is rare.  What happens before the procedure? Medicines  Your health care provider may have you start taking: ? Blood-thinning medicines (anticoagulants) so your blood does not clot as easily. ? Medicines may  be given to help stabilize your heart rate and rhythm.  Ask your health care provider about changing or stopping your regular medicines. This is especially important if you are taking diabetes medicines or blood thinners. General instructions  Plan to have someone take you home from the hospital or clinic.  If you will be going home right after the  procedure, plan to have someone with you for 24 hours.  Follow instructions from your health care provider about eating or drinking restrictions. What happens during the procedure?  To lower your risk of infection: ? Your health care team will wash or sanitize their hands. ? Your skin will be washed with soap.  An IV tube will be inserted into one of your veins.  You will be given a medicine to help you relax (sedative).  Sticky patches (electrodes) or metal paddles may be placed on your chest.  An electrical shock will be delivered. The procedure may vary among health care providers and hospitals. What happens after the procedure?  Your blood pressure, heart rate, breathing rate, and blood oxygen level will be monitored until the medicines you were given have worn off.  Do not drive for 24 hours if you were given a sedative.  Your heart rhythm will be watched to make sure it does not change. This information is not intended to replace advice given to you by your health care provider. Make sure you discuss any questions you have with your health care provider. Document Released: 12/29/2001 Document Revised: 09/07/2015 Document Reviewed: 07/15/2015 Elsevier Interactive Patient Education  2017 [employer].

## 2017-07-15 NOTE — Telephone Encounter (Signed)
Call placed to [place]. Spoke with Anna Mathews, Prolia scheduled for delivery to [name] on 07/16/17. Confirmed delivery address.    Call returned to patient. Nurse visit scheduled for Prolia injection on 07/29/17 at 10am. Patient verbalizes understanding.   Routing to provider for final review. Patient is agreeable to disposition. Will close encounter.

## 2017-07-15 NOTE — Telephone Encounter (Signed)
Pharmacy left voicemail to confirm shipment of Prolia. Asked for a return call to confirm delivery date of Tuesday.

## 2017-07-16 ENCOUNTER — Ambulatory Visit (HOSPITAL_COMMUNITY): Payer: Medicare Other | Admitting: Anesthesiology

## 2017-07-16 ENCOUNTER — Other Ambulatory Visit: Payer: Self-pay

## 2017-07-16 ENCOUNTER — Encounter (HOSPITAL_COMMUNITY)
Admission: RE | Disposition: A | Discharge status: Home / Self Care | Payer: Self-pay | Source: Ambulatory Visit | Attending: Cardiology

## 2017-07-16 ENCOUNTER — Ambulatory Visit (HOSPITAL_COMMUNITY)
Admission: RE | Admit: 2017-07-16 | Discharge: 2017-07-16 | Disposition: A | Discharge status: Home / Self Care | Payer: Medicare Other | Source: Ambulatory Visit | Attending: Cardiology | Admitting: Cardiology

## 2017-07-16 ENCOUNTER — Encounter (HOSPITAL_COMMUNITY): Payer: Self-pay

## 2017-07-16 DIAGNOSIS — Z9889 Other specified postprocedural states: Secondary | ICD-10-CM | POA: Diagnosis not present

## 2017-07-16 DIAGNOSIS — I10 Essential (primary) hypertension: Secondary | ICD-10-CM | POA: Insufficient documentation

## 2017-07-16 DIAGNOSIS — Z8249 Family history of ischemic heart disease and other diseases of the circulatory system: Secondary | ICD-10-CM | POA: Insufficient documentation

## 2017-07-16 DIAGNOSIS — Z79899 Other long term (current) drug therapy: Secondary | ICD-10-CM | POA: Diagnosis not present

## 2017-07-16 DIAGNOSIS — Z87891 Personal history of nicotine dependence: Secondary | ICD-10-CM | POA: Insufficient documentation

## 2017-07-16 DIAGNOSIS — I484 Atypical atrial flutter: Secondary | ICD-10-CM | POA: Diagnosis not present

## 2017-07-16 DIAGNOSIS — Z7982 Long term (current) use of aspirin: Secondary | ICD-10-CM | POA: Diagnosis not present

## 2017-07-16 DIAGNOSIS — I4892 Unspecified atrial flutter: Secondary | ICD-10-CM | POA: Insufficient documentation

## 2017-07-16 DIAGNOSIS — Z882 Allergy status to sulfonamides status: Secondary | ICD-10-CM | POA: Diagnosis not present

## 2017-07-16 DIAGNOSIS — Z8261 Family history of arthritis: Secondary | ICD-10-CM | POA: Diagnosis not present

## 2017-07-16 DIAGNOSIS — I480 Paroxysmal atrial fibrillation: Secondary | ICD-10-CM | POA: Insufficient documentation

## 2017-07-16 DIAGNOSIS — Z7901 Long term (current) use of anticoagulants: Secondary | ICD-10-CM | POA: Diagnosis not present

## 2017-07-16 DIAGNOSIS — Z880 Allergy status to penicillin: Secondary | ICD-10-CM | POA: Diagnosis not present

## 2017-07-16 DIAGNOSIS — K219 Gastro-esophageal reflux disease without esophagitis: Secondary | ICD-10-CM | POA: Insufficient documentation

## 2017-07-16 DIAGNOSIS — M8580 Other specified disorders of bone density and structure, unspecified site: Secondary | ICD-10-CM | POA: Diagnosis not present

## 2017-07-16 DIAGNOSIS — E785 Hyperlipidemia, unspecified: Secondary | ICD-10-CM | POA: Insufficient documentation

## 2017-07-16 HISTORY — PX: CARDIOVERSION: SHX1299

## 2017-07-16 SURGERY — CARDIOVERSION
Anesthesia: General

## 2017-07-16 MED ORDER — PROPOFOL 10 MG/ML IV BOLUS
INTRAVENOUS | Status: DC | PRN
  Administered 2017-07-16: 50 mg via INTRAVENOUS

## 2017-07-16 MED ORDER — LIDOCAINE HCL (CARDIAC) PF 100 MG/5ML IV SOSY
PREFILLED_SYRINGE | INTRAVENOUS | Status: DC | PRN
  Administered 2017-07-16: 40 mg via INTRAVENOUS

## 2017-07-16 MED ORDER — SODIUM CHLORIDE 0.9 % IV SOLN
INTRAVENOUS | Status: DC
  Administered 2017-07-16: 500 mL via INTRAVENOUS

## 2017-07-16 NOTE — Discharge Instructions (Signed)
Electrical Cardioversion, Care After °This sheet gives you information about how to care for yourself after your procedure. Your health care provider may also give you more specific instructions. If you have problems or questions, contact your health care provider. °What can I expect after the procedure? °After the procedure, it is common to have: °· Some redness on the skin where the shocks were given. ° °Follow these instructions at home: °· Do not drive for 24 hours if you were given a medicine to help you relax (sedative). °· Take over-the-counter and prescription medicines only as told by your health care provider. °· Ask your health care provider how to check your pulse. Check it often. °· Rest for 48 hours after the procedure or as told by your health care provider. °· Avoid or limit your caffeine use as told by your health care provider. °Contact a health care provider if: °· You feel like your heart is beating too quickly or your pulse is not regular. °· You have a serious muscle cramp that does not go away. °Get help right away if: °· You have discomfort in your chest. °· You are dizzy or you feel faint. °· You have trouble breathing or you are short of breath. °· Your speech is slurred. °· You have trouble moving an arm or leg on one side of your body. °· Your fingers or toes turn cold or blue. °This information is not intended to replace advice given to you by your health care provider. Make sure you discuss any questions you have with your health care provider. °Document Released: 10/29/2012 Document Revised: 08/12/2015 Document Reviewed: 07/15/2015 °Elsevier Interactive Patient Education © 2018 Elsevier Inc. ° °

## 2017-07-16 NOTE — Anesthesia Preprocedure Evaluation (Signed)
Anesthesia Evaluation  Patient identified by MRN, date of birth, ID band Patient awake    Reviewed: Allergy & Precautions, NPO status , Patient's Chart, lab work & pertinent test results, reviewed documented beta blocker date and time   Airway Mallampati: II  TM Distance: >3 FB Neck ROM: Full    Dental  (+) Teeth Intact, Caps   Pulmonary former smoker,    Pulmonary exam normal breath sounds clear to auscultation       Cardiovascular hypertension, Pt. on medications and Pt. on home beta blockers + dysrhythmias Atrial Fibrillation  Rhythm:Irregular Rate:Normal  PAF- anticoagulated on flecainide and metoprolo   Neuro/Psych  Neuromuscular disease negative psych ROS   GI/Hepatic Neg liver ROS, hiatal hernia, GERD  Medicated and Controlled,  Endo/Other  Hyperlipidemia  Renal/GU negative Renal ROS  negative genitourinary   Musculoskeletal osteoporosis   Abdominal   Peds  Hematology negative hematology ROS (+)   Anesthesia Other Findings   Reproductive/Obstetrics                             Anesthesia Physical Anesthesia Plan  ASA: III  Anesthesia Plan: General   Post-op Pain Management:    Induction: Intravenous  PONV Risk Score and Plan: 3 and Ondansetron and Treatment may vary due to age or medical condition  Airway Management Planned: Mask  Additional Equipment:   Intra-op Plan:   Post-operative Plan:   Informed Consent: I have reviewed the patients History and Physical, chart, labs and discussed the procedure including the risks, benefits and alternatives for the proposed anesthesia with the patient or authorized representative who has indicated his/her understanding and acceptance.   Dental advisory given  Plan Discussed with: CRNA and Anesthesiologist  Anesthesia Plan Comments:         Anesthesia Quick Evaluation

## 2017-07-16 NOTE — Interval H&P Note (Signed)
History and Physical Interval Note:  07/16/2017 10:17 AM  Anna Mathews  has presented today for surgery, with the diagnosis of AFIB  The various methods of treatment have been discussed with the patient and family. After consideration of risks, benefits and other options for treatment, the patient has consented to  Procedure(s): CARDIOVERSION (N/A) as a surgical intervention .  [name] history has been reviewed, patient examined, no change in status, stable for surgery.  I have reviewed the patient's chart and labs.  Questions were answered to the patient's satisfaction.     [employer]

## 2017-07-16 NOTE — Transfer of Care (Signed)
Immediate Anesthesia Transfer of Care Note  Patient: Anna Mathews  Procedure(s) Performed: CARDIOVERSION (N/A )  Patient Location: Endoscopy Unit  Anesthesia Type:MAC  Level of Consciousness: awake, alert  and oriented  Airway & Oxygen Therapy: Patient Spontanous Breathing and Patient connected to nasal cannula oxygen  Post-op Assessment: Report given to RN and Post -op Vital signs reviewed and stable  Post vital signs: Reviewed and stable  Last Vitals:  Vitals Value Taken Time  BP    Temp    Pulse    Resp    SpO2      Last Pain:  Vitals:   07/16/17 1014  TempSrc: Oral  PainSc: 0-No pain         Complications: No apparent anesthesia complications

## 2017-07-16 NOTE — Anesthesia Procedure Notes (Signed)
Procedure Name: MAC Date/Time: 07/16/2017 10:36 AM Performed by: Teressa Lower., CRNA Pre-anesthesia Checklist: Patient identified, Emergency Drugs available, Suction available, Patient being monitored and Timeout performed Patient Re-evaluated:Patient Re-evaluated prior to induction Oxygen Delivery Method: Nasal cannula

## 2017-07-16 NOTE — CV Procedure (Signed)
    Electrical Cardioversion Procedure Note BETSAIDA MISSOURI [id number] 1940/09/03  Procedure: Electrical Cardioversion Indications:  Atrial Flutter  Time Out: Verified patient identification, verified procedure,medications/allergies/relevent history reviewed, required imaging and test results available.  Performed  Procedure Details  The patient was NPO after midnight. Anesthesia was administered at the beside  by Dr. Royce Macadamia with 50mg  of propofol.  Patient missed one isolated dose of Eliquis through her 3 week prep for cardioversion. Explained to her the minimally increased risk of embolus. She is willing to proceed. Cardioversion was performed with synchronized biphasic defibrillation via AP pads with 50 joules.  1 attempt(s) were performed.  The patient converted to normal sinus rhythm. The patient tolerated the procedure well   IMPRESSION:  Successful cardioversion of atrial flutter Continue flecainide and Eliquis.     Candee Furbish 07/16/2017, 10:37 AM

## 2017-07-16 NOTE — Anesthesia Postprocedure Evaluation (Signed)
Anesthesia Post Note  Patient: Anna Mathews  Procedure(s) Performed: CARDIOVERSION (N/A )     Patient location during evaluation: PACU Anesthesia Type: General Level of consciousness: awake and alert and oriented Pain management: pain level controlled Vital Signs Assessment: post-procedure vital signs reviewed and stable Respiratory status: spontaneous breathing, nonlabored ventilation and respiratory function stable Cardiovascular status: blood pressure returned to baseline and stable Postop Assessment: no apparent nausea or vomiting Anesthetic complications: no    Last Vitals:  Vitals:   07/16/17 1050 07/16/17 1100  BP: (!) 142/60 (!) 145/60  Pulse: (!) 55 (!) 52  Resp: 18 12  Temp:    SpO2: 97% 95%    Last Pain:  Vitals:   07/16/17 1100  TempSrc:   PainSc: 0-No pain                 Merl Guardino A.

## 2017-07-17 ENCOUNTER — Institutional Professional Consult (permissible substitution): Payer: Medicare Other | Admitting: Internal Medicine

## 2017-07-17 ENCOUNTER — Encounter (HOSPITAL_COMMUNITY): Payer: Self-pay | Admitting: Cardiology

## 2017-07-18 ENCOUNTER — Telehealth: Payer: Self-pay

## 2017-07-18 NOTE — Telephone Encounter (Signed)
Accidentally started telephone call

## 2017-07-21 ENCOUNTER — Other Ambulatory Visit: Payer: Self-pay | Admitting: Interventional Cardiology

## 2017-07-24 ENCOUNTER — Other Ambulatory Visit: Payer: Self-pay | Admitting: Interventional Cardiology

## 2017-07-29 ENCOUNTER — Ambulatory Visit (INDEPENDENT_AMBULATORY_CARE_PROVIDER_SITE_OTHER): Payer: Medicare Other

## 2017-07-29 VITALS — BP 120/68 | HR 66 | Resp 14 | Ht 64.0 in | Wt 151.8 lb

## 2017-07-29 DIAGNOSIS — M8580 Other specified disorders of bone density and structure, unspecified site: Secondary | ICD-10-CM | POA: Diagnosis not present

## 2017-07-29 MED ORDER — DENOSUMAB 60 MG/ML ~~LOC~~ SOSY
60.0000 mg | PREFILLED_SYRINGE | Freq: Once | SUBCUTANEOUS | Status: AC
  Administered 2017-07-29: 60 mg via SUBCUTANEOUS

## 2017-07-29 NOTE — Progress Notes (Signed)
Patient in today for Third Prolia injection. Patient's initial calcium level was obtained on 12/24/16  Result: 9.5  Last AEX: 05/16/17 Last BMD: 11/01/16 0.634  Injection given sq in left arm.  Patient tolerated injection well.  Routed to provider for review.

## 2017-08-08 ENCOUNTER — Other Ambulatory Visit: Payer: Self-pay | Admitting: Interventional Cardiology

## 2017-09-19 ENCOUNTER — Telehealth: Payer: Self-pay | Admitting: Interventional Cardiology

## 2017-09-19 NOTE — Telephone Encounter (Signed)
New Message:   Patient has questions about the hair products she is using .

## 2017-09-19 NOTE — Telephone Encounter (Signed)
Patient calling to see if she can take a new OTC supplement to help with her hair loss. Patient will look at the bottle when she gets back home and call with the active ingredient.

## 2017-09-20 NOTE — Telephone Encounter (Signed)
Patient returning call and states that the OTC supplement to help with her hair loss is biotin. Made patient aware that this should not interact with any of her meds per [place], [facility].

## 2017-09-20 NOTE — Telephone Encounter (Signed)
Left message for patient to call back  

## 2017-10-15 ENCOUNTER — Ambulatory Visit: Payer: Medicare Other | Admitting: Cardiology

## 2017-10-15 ENCOUNTER — Encounter: Payer: Self-pay | Admitting: Cardiology

## 2017-10-15 VITALS — BP 120/62 | HR 48 | Ht 64.0 in | Wt 152.0 lb

## 2017-10-15 DIAGNOSIS — E782 Mixed hyperlipidemia: Secondary | ICD-10-CM

## 2017-10-15 DIAGNOSIS — I10 Essential (primary) hypertension: Secondary | ICD-10-CM | POA: Diagnosis not present

## 2017-10-15 DIAGNOSIS — I481 Persistent atrial fibrillation: Secondary | ICD-10-CM

## 2017-10-15 DIAGNOSIS — I4819 Other persistent atrial fibrillation: Secondary | ICD-10-CM

## 2017-10-15 NOTE — Patient Instructions (Addendum)
Medication Instructions:  Your physician recommends that you continue on your current medications as directed. Please refer to the Current Medication list given to you today.  If you need a refill on your cardiac medications before your next appointment, please call your pharmacy.   Labwork: None ordered  Testing/Procedures: None ordered  Follow-Up: Your physician wants you to follow-up in: 6 months with Dr. Camnitz.  You will receive a reminder letter in the mail two months in advance. If you don't receive a letter, please call our office to schedule the follow-up appointment.  Thank you for choosing [facility]!!   Jeanette Rauth, RN [phone]         

## 2017-10-15 NOTE — Progress Notes (Signed)
Electrophysiology Office Note   Date:  10/15/2017   ID:  KAYDINCE TOWLES, DOB 10-21-1940, MRN [id number]  PCP:  Vernie Shanks, MD  Cardiologist:  Casandra Doffing Primary Electrophysiologist:  Zai Chmiel Meredith Leeds, MD    No chief complaint on file.    History of Present Illness: Anna Mathews is a 77 y.o. female who is being seen today for the evaluation of atrial fibrillation at the request of Ermalinda Barrios. Presenting today for electrophysiology evaluation.  She has a history of atrial fibrillation on flecainide, hypertension, hyperlipidemia.  She has not been feeling well for the last few months.  She had a colonoscopy about 6 weeks ago and she says that she went into atrial fibrillation at that time.  She has been more short of breath with activity such as going up and down stairs and walking her dog.  She also has quite a bit of fatigue.  She complains of palpitations when she lies on her left side. Was found to be in atrial flutter and had cardioversion 07/16/17.  Today, denies symptoms of palpitations, chest pain, shortness of breath, orthopnea, PND, lower extremity edema, claudication, dizziness, presyncope, syncope, bleeding, or neurologic sequela. The patient is tolerating medications without difficulties. Since her cardiovesion, she has felt well. Much less weakness and fatigue. SOB has improved but is still there with exertion.      Past Medical History:  Diagnosis Date  . Atrial fibrillation ([place]) 09/2009   on coumadin  . Diverticulosis 09/2006  . GERD (gastroesophageal reflux disease)   . Hiatal hernia   . Hyperlipidemia   . Hypertension   . Osteopenia   . Postmenopausal hormone replacement therapy 1989 - 07/2000   Past Surgical History:  Procedure Laterality Date  . 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 BREAST SURGERY Left 4/99     breast biopsy - fibrosis  . CARDIOVERSION N/A 07/16/2017   Procedure: CARDIOVERSION;  Surgeon: Jerline Pain, MD;  Location: [facility] ENDOSCOPY;  Service: Cardiovascular;  Laterality: N/A;  . CESAREAN SECTION     X 2  . HYSTEROSCOPY  4/95   w/D&C     Current Outpatient Medications  Medication Sig Dispense Refill  . amLODipine (NORVASC) 5 MG tablet TAKE 1 TABLET BY MOUTH EVERY DAY 90 tablet 3  . aspirin 81 MG chewable tablet Chew 81 mg by mouth daily.    . Biotin w/ Vitamins C & E (HAIR/SKIN/NAILS PO) Take 1 tablet by mouth daily.    . Calcium Carbonate-Vitamin D (CALCIUM 600+D PO) Take 600 mg by mouth 2 (two) times daily.    Marland Kitchen denosumab (PROLIA) 60 MG/ML SOLN injection Inject 60 mg into the skin every 6 (six) months. Administer in upper arm, thigh, or abdomen 1 Syringe 1  . ELIQUIS 5 MG TABS tablet TAKE 1 TABLET BY MOUTH TWICE A DAY 60 tablet 5  . flecainide (TAMBOCOR) 100 MG tablet Take 1 tablet (100 mg total) by mouth 2 (two) times daily. 60 tablet 10  . metoprolol succinate (TOPROL-XL) 50 MG 24 hr tablet TAKE 1 TABLET (50 MG TOTAL) BY MOUTH DAILY. 90 tablet 3  . Omega-3 Fatty Acids (OMEGA-3 PO) Take 1,200 mg by mouth 2 (two) times daily.     Marland Kitchen omeprazole (PRILOSEC) 20 MG capsule Take 20 mg by mouth daily as needed (heartburn).     Marland Kitchen  quinapril (ACCUPRIL) 40 MG tablet Take 40 mg by mouth daily.     . simvastatin (ZOCOR) 20 MG tablet Take 20 mg by mouth daily.     Marland Kitchen tretinoin (RETIN-A) 0.1 % cream APPLY PEA SIZED AMOUNT TO FACE AT NIGHT AS DIRECTED  2   No current facility-administered medications for this visit.     Allergies:   Penicillins; Crab [shellfish allergy]; and Sulfa antibiotics   Social History:  The patient  reports that she quit smoking about 49 years ago. Her smoking use included cigarettes. She started smoking about 60 years ago. She has a 3.00 pack-year smoking history. She has never used smokeless tobacco. She reports that she drinks alcohol. She reports that she does  not use drugs.   Family History:  [name] family history includes Breast cancer in her maternal grandmother; COPD in her father; Diabetes in her maternal aunt, maternal aunt, maternal grandfather, and son; Heart disease in her father; Hypertension in her mother; Kidney cancer in her brother; Rheum arthritis in her brother.   ROS:  Please see the history of present illness.   Otherwise, review of systems is positive for DOE.   All other systems are reviewed and negative.   PHYSICAL EXAM: VS:  BP 120/62   Pulse (!) 48   Ht 5\' 4"  (1.626 m)   Wt 152 lb (68.9 kg)   LMP 01/23/1988 (Approximate)   BMI 26.09 kg/m  , BMI Body mass index is 26.09 kg/m. GEN: Well nourished, well developed, in no acute distress  HEENT: normal  Neck: no JVD, carotid bruits, or masses Cardiac: RRR; no murmurs, rubs, or gallops,no edema  Respiratory:  clear to auscultation bilaterally, normal work of breathing GI: soft, nontender, nondistended, + BS MS: no deformity or atrophy  Skin: warm and dry Neuro:  Strength and sensation are intact Psych: euthymic mood, full affect  EKG:  EKG is ordered today. Personal review of the ekg ordered shows SR, rate 48   Recent Labs: 12/24/2016: ALT 22 07/12/2017: BUN 22; Creatinine, Ser 1.11; Hemoglobin 14.9; Platelets 242; Potassium 4.3; Sodium 143    Lipid Panel  No results found for: CHOL, TRIG, HDL, CHOLHDL, VLDL, LDLCALC, LDLDIRECT   Wt Readings from Last 3 Encounters:  10/15/17 152 lb (68.9 kg)  07/29/17 151 lb 12.8 oz (68.9 kg)  07/16/17 153 lb (69.4 kg)      Other studies Reviewed: Additional studies/ records that were reviewed today include: TTE 2014  Review of the above records today demonstrates:  LVEF 60 to 65%, mild MR, aortic valve is sclerotic but opens well, impaired relaxation consistent with age.    ASSESSMENT AND PLAN:  1.  Paroxysmal atrial fibrillation: On flecainide, eliquis, toprol XL. Recent cardioversion to SR. Continuing to have  SOB which may be due to sinus node dysfunction due to her metoprolol.  We Maryjo Ragon have her take her metoprolol in the evenings.  No other changes.  This patients CHA2DS2-VASc Score and unadjusted Ischemic Stroke Rate (% per year) is equal to 4.8 % stroke rate/year from a score of 4  Above score calculated as 1 point each if present [CHF, HTN, DM, Vascular=MI/PAD/Aortic Plaque, Age if 65-74, or Female] Above score calculated as 2 points each if present [Age > 75, or Stroke/TIA/TE]  2.  Hypertension: Well-controlled.  No changes.  3.  Hyperlipidemia: continue zocor  Current medicines are reviewed at length with the patient today.   The patient does not have concerns regarding her medicines.  The  following changes were made today: None  Labs/ tests ordered today include:  Orders Placed This Encounter  Procedures  . EKG 12-Lead   Disposition:   FU with Aylee Littrell 6 months  Signed, Rudine Rieger Meredith Leeds, MD  10/15/2017 12:24 PM     [place] [address] [place] [address] [phone] (office) [phone] (fax)

## 2017-11-15 ENCOUNTER — Other Ambulatory Visit: Payer: Self-pay | Admitting: Obstetrics and Gynecology

## 2017-11-18 NOTE — Telephone Encounter (Signed)
Last AEX 05/16/17 BMD 12/21/16 -osteopenia  Last Prolia received 07/29/17, this was 3rd injection  Next Prolia due on or after 01/25/18  Rx pended for Prolia #1 injection/1RF  Routing to Dr. Talbert Nan to advise on refill

## 2017-11-19 NOTE — Telephone Encounter (Signed)
Left message to call Venora Kautzman, RN at [facility] [phone].   

## 2017-11-19 NOTE — Addendum Note (Signed)
Addended by: Burnice Logan on: 11/19/2017 12:46 PM   Modules accepted: Orders

## 2017-11-20 ENCOUNTER — Other Ambulatory Visit: Payer: Self-pay | Admitting: Interventional Cardiology

## 2017-11-20 NOTE — Telephone Encounter (Signed)
Pt is a 20 yr.old female who saw Dr Curt Bears on 10/15/17, weight was 68.9Kg. SCr was 1.11 on 07/12/17. Will refill Eliquis 5mg  BID.

## 2017-12-03 NOTE — Telephone Encounter (Signed)
Spoke with patient, advised of Prolia refill and when next prolia is due. Patient is aware she will need to contact specialty pharmacy, [facility], for refil and authorize shipment of medication to [facility]. Patient verbalizes understanding and is aware to return call if any further assistance needed. Encounter closed.

## 2018-01-07 ENCOUNTER — Telehealth: Payer: Self-pay | Admitting: Obstetrics and Gynecology

## 2018-01-07 NOTE — Telephone Encounter (Signed)
Last Prolia received 07/29/17 Next Prolia due on or after 01/25/18 Last BMD 11/01/16   Call returned to [facility], spoke with Kennyth Lose. Was advised patient has requested refill of Prolia, consent for delivery to [facility] obtained. Prolia scheduled for delivery to [facility] on 01/09/18, address confirmed.

## 2018-01-07 NOTE — Telephone Encounter (Signed)
Patient returned call and scheduled her next Prolia shot on 01/28/18 at 3:00pm. Routing to nurse for review.

## 2018-01-07 NOTE — Telephone Encounter (Signed)
Left detailed message, ok per dpr. Advised patient Prolia scheduled for delivery to office on 12/19, next prolia due on or after 01/25/18. Please return call to office to schedule nurse visit.

## 2018-01-07 NOTE — Telephone Encounter (Signed)
Sam from [facility] called requesting a call back to confirm this patient's next appointment to schedule delivery of a medication that was not specified by name. Patient does not have any appointments scheduled at this time. Routing to triage for review.

## 2018-01-07 NOTE — Telephone Encounter (Signed)
Routing to Dr Jertson. FYI. Encounter closed.  

## 2018-01-16 ENCOUNTER — Ambulatory Visit: Payer: Medicare Other | Admitting: *Deleted

## 2018-01-16 ENCOUNTER — Encounter (INDEPENDENT_AMBULATORY_CARE_PROVIDER_SITE_OTHER): Payer: Self-pay

## 2018-01-16 DIAGNOSIS — Z5181 Encounter for therapeutic drug level monitoring: Secondary | ICD-10-CM | POA: Diagnosis not present

## 2018-01-16 DIAGNOSIS — I4891 Unspecified atrial fibrillation: Secondary | ICD-10-CM | POA: Diagnosis not present

## 2018-01-16 NOTE — Progress Notes (Signed)
Pt was started on Eliquis 5mg  twice a day for Afib on 12/21/2016 by Dr. Irish Lack.    Reviewed patients medication list.  Pt is not currently on any combined P-gp and strong CYP3A4 inhibitors/inducers (ketoconazole, traconazole, ritonavir, carbamazepine, phenytoin, rifampin, St. John's wort).  Reviewed labs: SCr-0.89, Hgb-13.3, Hct-39.9, Weight-69.6kg (153.12lbs). Dose  appropriate based on age, weight, and SCr.  Hgb and HCT within normal limits.   A full discussion of the nature of anticoagulants has been carried out. A benefit/risk analysis has been presented to the patient, so that they understand the justification for choosing anticoagulation with Eliquis at this time.  The need for compliance is stressed.  Pt is aware to take the medication twice daily.  Side effects of potential bleeding are discussed, including unusual colored urine or stools, coughing up blood or coffee ground emesis, nose bleeds or serious fall or head trauma.  Discussed signs and symptoms of stroke. The patient should avoid any OTC items containing aspirin or ibuprofen. Avoid alcohol consumption. Call if any signs of abnormal bleeding.  Discussed financial obligations and resolved any difficulty in obtaining medication.    01/16/18-per patient she has seen less swelling in her feet since stopping Coumadin years ago. She stated she forget her am dose of Eliquis one day this week but took her evening dose that day and that this is something that rarely happens. 01/17/18-Pt aware labs have been resulted and reviewed. Pt will remain on Eliquis 5mg  twice day and call Cardiologist with any issues. Also, pt will follow up with Cardiologists per his recommendations and as needed.

## 2018-01-17 LAB — CBC
Hematocrit: 39.9 % (ref 34.0–46.6)
Hemoglobin: 13.3 g/dL (ref 11.1–15.9)
MCH: 30.9 pg (ref 26.6–33.0)
MCHC: 33.3 g/dL (ref 31.5–35.7)
MCV: 93 fL (ref 79–97)
Platelets: 228 10*3/uL (ref 150–450)
RBC: 4.3 x10E6/uL (ref 3.77–5.28)
RDW: 13.2 % (ref 12.3–15.4)
WBC: 6.8 10*3/uL (ref 3.4–10.8)

## 2018-01-17 LAB — BASIC METABOLIC PANEL
BUN/Creatinine Ratio: 28 (ref 12–28)
BUN: 25 mg/dL (ref 8–27)
CO2: 25 mmol/L (ref 20–29)
Calcium: 9.6 mg/dL (ref 8.7–10.3)
Chloride: 105 mmol/L (ref 96–106)
Creatinine, Ser: 0.89 mg/dL (ref 0.57–1.00)
GFR calc Af Amer: 71 mL/min/{1.73_m2} (ref >59)
GFR calc non Af Amer: 62 mL/min/{1.73_m2} (ref >59)
Glucose: 112 mg/dL — ABNORMAL HIGH (ref 65–99)
Potassium: 4.7 mmol/L (ref 3.5–5.2)
Sodium: 144 mmol/L (ref 134–144)

## 2018-01-28 ENCOUNTER — Ambulatory Visit (INDEPENDENT_AMBULATORY_CARE_PROVIDER_SITE_OTHER): Payer: Medicare Other

## 2018-01-28 VITALS — BP 122/74 | HR 80 | Resp 14 | Ht 63.25 in

## 2018-01-28 DIAGNOSIS — M8580 Other specified disorders of bone density and structure, unspecified site: Secondary | ICD-10-CM

## 2018-01-28 MED ORDER — DENOSUMAB 60 MG/ML ~~LOC~~ SOSY
60.0000 mg | PREFILLED_SYRINGE | Freq: Once | SUBCUTANEOUS | Status: AC
  Administered 2018-01-28: 60 mg via SUBCUTANEOUS

## 2018-01-28 NOTE — Progress Notes (Signed)
Patient in today for fourth Prolia injection. Patient's initial calcium level was obtained on 12/24/16.  Result:9.5  Last AEX: 4//25/19 Last BMD: 11/01/16 0.634  Injection given in right Arm.  Patient tolerated injection well.  Routed to provider for review.

## 2018-03-03 ENCOUNTER — Telehealth: Payer: Self-pay | Admitting: Interventional Cardiology

## 2018-03-03 NOTE — Telephone Encounter (Signed)
I will route to Drue Novel, RN for Dr. Irish Lack. Not urgent for triage

## 2018-03-03 NOTE — Telephone Encounter (Signed)
° ° °  Pt c/o medication issue:  1. Name of Medication: Biotin  2. How are you currently taking this medication (dosage and times per day)? n/a  3. Are you having a reaction (difficulty breathing--STAT)? no  4. What is your medication issue?  Patient wants to know if she can take Biotin to help with hair growth

## 2018-03-04 NOTE — Telephone Encounter (Signed)
Called and made patient aware that she can take OTC biotin to help with her hair growth. Made her aware that it will not interact with any of the medicines that she takes.

## 2018-04-24 ENCOUNTER — Telehealth: Payer: Self-pay | Admitting: Interventional Cardiology

## 2018-04-24 NOTE — Telephone Encounter (Signed)
New Message   Pt c/o medication issue:  1. Name of Medication: Aspirin  2. How are you currently taking this medication (dosage and times per day)? 81mg    3. Are you having a reaction (difficulty breathing--STAT)? NO  4. What is your medication issue? Patient wants to know if she should continue taking her Aspirin due to Covid-19.

## 2018-04-24 NOTE — Telephone Encounter (Signed)
F/U Message ° ° ° ° ° ° ° ° ° °Patient returned your call °

## 2018-04-24 NOTE — Telephone Encounter (Signed)
Advised to continue taking ASA. Pt agreeable

## 2018-05-22 ENCOUNTER — Ambulatory Visit: Payer: Medicare Other | Admitting: Obstetrics and Gynecology

## 2018-06-09 ENCOUNTER — Telehealth: Payer: Self-pay | Admitting: *Deleted

## 2018-06-09 ENCOUNTER — Telehealth: Payer: Self-pay | Admitting: Obstetrics and Gynecology

## 2018-06-09 DIAGNOSIS — M8580 Other specified disorders of bone density and structure, unspecified site: Secondary | ICD-10-CM

## 2018-06-09 NOTE — Telephone Encounter (Signed)
Patient is calling regarding Prolia injection.

## 2018-06-09 NOTE — Telephone Encounter (Signed)
Patient returned call regarding prolia. Patient states she called [facility], is unsure what she needs to complete refill for Prolia. Advised RN will contact [facility] and return call, patient agreeable.

## 2018-06-09 NOTE — Telephone Encounter (Signed)
Spoke with patient.  4th Prolia injection received 01/28/18 Due for next injection on or after 07/27/18 Last BMD 11/01/16 Last Rx for prolia sent 11/18/17 #1/1RF  Patient reports no changes in insurance. Advised patient to contact [facility] to request refill of Prolia. Once delivery of Prolia has been scheduled to [facility] our office will return call to schedule nurse visit. Advised to call if any additional assistance needed. Patient verbalizes understanding and is agreeable.   Encounter closed.

## 2018-06-10 NOTE — Telephone Encounter (Signed)
Spoke with at [employer], spoke with Rox. Confirmed 1 refill on file for Prolia.  Was advised awaiting consent from patient, once received, [facility] will contact [facility] to schedule delivery. Patients out of pocket responsibility $128. [facility] will contact patient to advise.     4th Prolia injection received 01/28/18 Due for next injection on or after 07/27/18 Last BMD 11/01/16 Last Rx for prolia sent 11/18/17 #1/1RF

## 2018-06-13 NOTE — Telephone Encounter (Signed)
Patient is asking to talk with a nurse about her prolia injection.

## 2018-06-13 NOTE — Telephone Encounter (Signed)
Spoke with Ron at [employer]. Prolia scheduled for delivery to [facility] on 07/09/18.    Call returned to patient, Left message to call Sharee Pimple, RN at [place] [facility].

## 2018-06-13 NOTE — Telephone Encounter (Signed)
Left message to call Giomar Gusler, RN at [facility] [phone].   

## 2018-06-13 NOTE — Telephone Encounter (Signed)
Spoke with patient. Patient states she has spoken with [facility] and authorized Prolia be delivered to [facility]. Patient states she was advised she will need a new RX sent. Advised patient she should have 1 refill of Prolia on file. Advised RN will contact [facility] and return call.

## 2018-06-15 ENCOUNTER — Other Ambulatory Visit: Payer: Self-pay | Admitting: Interventional Cardiology

## 2018-06-17 NOTE — Telephone Encounter (Signed)
Left message to call Shante Maysonet, RN at [facility] [phone].   

## 2018-06-30 NOTE — Telephone Encounter (Signed)
Spoke with patient.   1.Advised patient of Prolia scheduled for delivery to [facility] on 07/09/18, due for next injection on or after 07/27/18. Nurse visit scheduled for 07/28/18 at 2:30pm.   2. Advised patient she will receive Prolia as scheduled, referral recommended to endocrinology for ongoing management of medication for tx osteoporosis/osteopenia. Patient agreeable, patient request referral to [facility], any provider. Advised I will place order for referral, their office will contact you directly to schedule. Patient verbalizes understanding.    Order placed to [facility] endocrinology.   Routing to provider for final review. Patient is agreeable to disposition. Will close encounter.

## 2018-07-08 ENCOUNTER — Other Ambulatory Visit: Payer: Self-pay | Admitting: Interventional Cardiology

## 2018-07-10 ENCOUNTER — Other Ambulatory Visit: Payer: Self-pay | Admitting: Interventional Cardiology

## 2018-07-23 ENCOUNTER — Other Ambulatory Visit: Payer: Self-pay

## 2018-07-28 ENCOUNTER — Ambulatory Visit: Payer: Medicare Other

## 2018-07-30 ENCOUNTER — Ambulatory Visit: Payer: Medicare Other

## 2018-07-30 NOTE — Progress Notes (Deleted)
Patient in today for fifth Prolia injection. Patient's initial calcium level was obtained on 01-16-18.  Result: 9.6.  Last AEX: 05-16-17 JJ  Last BMD: 11-01-16 osteopenia   Injection given in left arm.  Patient tolerated injection well.  Routed to provider for review.

## 2018-08-06 ENCOUNTER — Other Ambulatory Visit: Payer: Self-pay

## 2018-08-06 ENCOUNTER — Ambulatory Visit (INDEPENDENT_AMBULATORY_CARE_PROVIDER_SITE_OTHER): Payer: Medicare Other

## 2018-08-06 VITALS — BP 114/62 | HR 68 | Temp 97.4°F | Resp 16 | Ht 64.0 in | Wt 147.0 lb

## 2018-08-06 DIAGNOSIS — M8580 Other specified disorders of bone density and structure, unspecified site: Secondary | ICD-10-CM | POA: Diagnosis not present

## 2018-08-06 MED ORDER — DENOSUMAB 60 MG/ML ~~LOC~~ SOSY
60.0000 mg | PREFILLED_SYRINGE | Freq: Once | SUBCUTANEOUS | Status: AC
  Administered 2018-08-06: 60 mg via SUBCUTANEOUS

## 2018-08-06 NOTE — Progress Notes (Signed)
Patient in today for fifth Prolia injection. Patient's initial calcium level was obtained on Result: 12/24/16 Results 9.5   Last AEX: 05/16/17 Last BMD:11/01/16 0.634  Injection given in left Arm .  Patient tolerated injection well.  Routed to provider for review.

## 2018-08-20 ENCOUNTER — Other Ambulatory Visit: Payer: Self-pay

## 2018-08-25 ENCOUNTER — Other Ambulatory Visit: Payer: Self-pay

## 2018-08-25 ENCOUNTER — Ambulatory Visit (INDEPENDENT_AMBULATORY_CARE_PROVIDER_SITE_OTHER): Payer: Medicare Other | Admitting: Internal Medicine

## 2018-08-25 ENCOUNTER — Encounter: Payer: Self-pay | Admitting: Internal Medicine

## 2018-08-25 VITALS — BP 138/82 | HR 85 | Temp 98.7°F | Ht 64.0 in | Wt 147.4 lb

## 2018-08-25 DIAGNOSIS — M8580 Other specified disorders of bone density and structure, unspecified site: Secondary | ICD-10-CM | POA: Diagnosis not present

## 2018-08-25 NOTE — Progress Notes (Signed)
Name: Anna Mathews  MRN/ DOB: [id number], 1940/03/12    Age/ Sex: 78 y.o., female    PCP: Vernie Shanks, MD   Reason for Endocrinology Evaluation: Osteopenia      Date of Initial Endocrinology Evaluation: 08/26/2018     HPI: [name] is a 78 y.o. female with a past medical history of A.Fib, HTN and dyslipidemia , GERD. The patient presented for initial endocrinology clinic visit on 08/26/2018 for consultative assistance with her Osteopenia.   Pt was diagnosed with osteopenia:2013  Menarche at age : 17 Menopausal at age : 01/23/1988 Fracture Hx:  Finger fracture from a fall Hx of HRT: no FH of osteoporosis or hip fracture: no Prior Hx of anti-estrogenic therapy :Evista in 2016 Prior Hx of anti-resorptive therapy : Fosmax -worse GERD,Prolia started 01/2017  Pt has been on anti-resorptive therapy due to a 10-yr FRAX of > 3.0 at the hip   Lives alone       HISTORY:  Past Medical History:  Past Medical History:  Diagnosis Date  . Atrial fibrillation ([place]) 09/2009   on coumadin  . Diverticulosis 09/2006  . GERD (gastroesophageal reflux disease)   . Hiatal hernia   . Hyperlipidemia   . Hypertension   . Osteopenia   . Postmenopausal hormone replacement therapy 1989 - 07/2000   Past Surgical History:  Past Surgical History:  Procedure Laterality Date  . 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 BREAST SURGERY Left 4/99   breast biopsy - fibrosis  . CARDIOVERSION N/A 07/16/2017   Procedure: CARDIOVERSION;  Surgeon: Jerline Pain, MD;  Location: [facility] ENDOSCOPY;  Service: Cardiovascular;  Laterality: N/A;  . CESAREAN SECTION     X 2  . HYSTEROSCOPY  4/95   w/D&C    Social History:  reports that she quit smoking about 50 years ago. Her smoking use included cigarettes. She started smoking about 61 years ago. She has a 3.00  pack-year smoking history. She has never used smokeless tobacco. She reports current alcohol use. She reports that she does not use drugs. Family History: family history includes Breast cancer in her maternal grandmother; COPD in her father; Diabetes in her maternal aunt, maternal aunt, maternal grandfather, and son; Heart disease in her father; Hypertension in her mother; Kidney cancer in her brother; Rheum arthritis in her brother.   HOME MEDICATIONS: Allergies as of 08/25/2018      Reactions   Penicillins Other (See Comments)   REACTION: "ITCHING IN EYES" Has patient had a PCN reaction causing immediate rash, facial/tongue/throat swelling, SOB or lightheadedness with hypotension: Unknown Has patient had a PCN reaction causing severe rash involving mucus membranes or skin necrosis: Unknown Has patient had a PCN reaction that required hospitalization: No Has patient had a PCN reaction occurring within the last 10 years: No If all of the above answers are "NO", then may proceed with Cephalosporin use.   Crab [shellfish Allergy] Itching   Full body itching   Sulfa Antibiotics Other (See Comments)   REACTION: " NOT SURE"      Medication List       Accurate as of August 25, 2018 11:59 PM. If you have any questions, ask your nurse or doctor.        amLODipine 5 MG tablet Commonly known as: NORVASC TAKE 1 TABLET BY MOUTH EVERY  DAY   aspirin 81 MG chewable tablet Chew 81 mg by mouth daily.   CALCIUM 600+D PO Take 600 mg by mouth 2 (two) times daily.   Eliquis 5 MG Tabs tablet Generic drug: apixaban TAKE 1 TABLET BY MOUTH TWICE A DAY   flecainide 100 MG tablet Commonly known as: TAMBOCOR TAKE 1 TABLET BY MOUTH TWICE A DAY   HAIR/SKIN/NAILS PO Take 1 tablet by mouth daily.   metoprolol succinate 50 MG 24 hr tablet Commonly known as: TOPROL-XL TAKE 1 TABLET (50 MG TOTAL) BY MOUTH DAILY.   OMEGA-3 PO Take 1,200 mg by mouth 2 (two) times daily.   omeprazole 20 MG  capsule Commonly known as: PRILOSEC Take 20 mg by mouth daily as needed (heartburn).   Prolia 60 MG/ML Sosy injection Generic drug: denosumab INJECT 60MG  SUBCUTANEOUSLY  INTO UPPER ARM, THIGH, OR  ABDOMEN EVERY 6 MONTHS  (GIVEN AT PRESCRIBERS  OFFICE)   quinapril 40 MG tablet Commonly known as: ACCUPRIL Take 40 mg by mouth daily.   simvastatin 20 MG tablet Commonly known as: ZOCOR Take 20 mg by mouth daily.   tretinoin 0.1 % cream Commonly known as: RETIN-A APPLY PEA SIZED AMOUNT TO FACE AT NIGHT AS DIRECTED         REVIEW OF SYSTEMS: A comprehensive ROS was conducted with the patient and is negative except as per HPI and below:  Review of Systems  Constitutional: Negative for fever and weight loss.  HENT: Negative for congestion and sore throat.   Eyes: Negative for blurred vision and pain.  Cardiovascular: Negative for chest pain and palpitations.  Gastrointestinal: Positive for heartburn. Negative for diarrhea and nausea.  Genitourinary: Negative for frequency.  Skin: Negative.   Neurological: Negative for tingling and tremors.  Endo/Heme/Allergies: Negative for polydipsia.       OBJECTIVE:  VS: BP 138/82 (BP Location: Left Arm, Patient Position: Sitting, Cuff Size: Normal)   Pulse 85   Temp 98.7 F (37.1 C)   Ht 5\' 4"  (1.626 m)   Wt 147 lb 6.4 oz (66.9 kg)   LMP 01/23/1988 (Approximate)   SpO2 96%   BMI 25.30 kg/m    Wt Readings from Last 3 Encounters:  08/25/18 147 lb 6.4 oz (66.9 kg)  08/06/18 147 lb (66.7 kg)  10/15/17 152 lb (68.9 kg)     EXAM: General: Pt appears well and is in NAD  Eyes: External eye exam normal without stare, lid lag or exophthalmos.  EOM intact.  PERRL.  Ears, Nose, Throat: Hearing: Grossly intact bilaterally Dental: Good dentition  Throat: Clear without mass, erythema or exudate  Neck: General: Supple without adenopathy. Thyroid: Thyroid size normal.  No goiter or nodules appreciated. No thyroid bruit.  Lungs: Clear with  good BS bilat with no rales, rhonchi, or wheezes  Heart: Auscultation: RRR.  Abdomen: Normoactive bowel sounds, soft, nontender, without masses or organomegaly palpable  Extremities:  BL LE: No pretibial edema normal ROM and strength.  Skin: Hair: Texture and amount normal with gender appropriate distribution Skin Inspection: Right inner arm ecchymotic lesion.  Skin Palpation: Skin temperature, texture, and thickness normal to palpation  Neuro: Cranial nerves: II - XII grossly intact  Motor: Normal strength throughout DTRs: 2+ and symmetric in UE without delay in relaxation phase  Mental Status: Judgment, insight: Intact Orientation: Oriented to time, place, and person Mood and affect: No depression, anxiety, or agitation     DATA REVIEWED:     2016 2018  Right  Femoral neck -  1.0 -1.9  Right total hip n/a -1.1  Left femoral neck  -0.7 -1.6  Left total hip n/a -1.2    ASSESSMENT/PLAN/RECOMMENDATIONS:   Osteopenia :  - Meets criteria for anti-resorptive therapy due to a 10- yr FRAX of > 3.0% at the hips.  - She is intolerant to oral bisphosphonates , has been on Evista in 2016. Started prolia in 01/2017, received last dose 08/06/2018 - Due for prolia in 01/2019 - We talked about discussing "drug holiday " sometime next year , after completing 3 yrs of prolia.  - Will obtain BMP and vitamin D levels today.  - Will repeat DXA in 06/2019 [facility] mammography place)     Medications : Calcium - vitamin D 600 mg BID    F/u in 07/2019 to coincide with her prolia injection   Signed electronically by: Mack Guise, MD  [facility]  [facility] [address] [place],  [address] Phone: [phone] FAX: [phone]   CC: Vernie Shanks, [place] [address] Phone: [phone] Fax: [phone]   Return to Endocrinology clinic as below: No future appointments.

## 2018-08-25 NOTE — Patient Instructions (Addendum)
-   Continue Calcium tablets twice a day    - Will set you up for a repeat bone density in June, 2021 - We will set you up for another prolia dose in January, 2021

## 2018-08-26 ENCOUNTER — Encounter: Payer: Self-pay | Admitting: Internal Medicine

## 2018-10-01 ENCOUNTER — Other Ambulatory Visit: Payer: Self-pay | Admitting: Interventional Cardiology

## 2018-10-02 ENCOUNTER — Other Ambulatory Visit: Payer: Self-pay | Admitting: *Deleted

## 2018-10-02 MED ORDER — APIXABAN 5 MG PO TABS: 5.0000 mg | ORAL_TABLET | Freq: Two times a day (BID) | ORAL | 0 refills | Status: DC

## 2018-10-02 NOTE — Telephone Encounter (Signed)
Eliquis 5mg  refill request received; pt is 78yrs old, weight-66.9kg, Crea-0.89 on 01/16/2018, Diagnosis-Afib, and last seen by Dr. Curt Bears on 10/15/2017. Dose is appropriate based on dosing criteria.    Pt was due to follow up with Dr. Curt Bears in March 2020 per his OV note and recall. Called pt and had to leave a message to call back to schedule an appt with Dr. Curt Bears. 30 day supply sent and placed a note on refill.

## 2018-10-07 ENCOUNTER — Encounter: Payer: Self-pay | Admitting: Interventional Cardiology

## 2018-10-07 ENCOUNTER — Other Ambulatory Visit: Payer: Self-pay

## 2018-10-07 ENCOUNTER — Ambulatory Visit (INDEPENDENT_AMBULATORY_CARE_PROVIDER_SITE_OTHER): Payer: Medicare Other | Admitting: Interventional Cardiology

## 2018-10-07 VITALS — BP 118/76 | HR 59 | Ht 64.0 in | Wt 153.8 lb

## 2018-10-07 DIAGNOSIS — E782 Mixed hyperlipidemia: Secondary | ICD-10-CM | POA: Diagnosis not present

## 2018-10-07 DIAGNOSIS — I10 Essential (primary) hypertension: Secondary | ICD-10-CM | POA: Diagnosis not present

## 2018-10-07 DIAGNOSIS — I4891 Unspecified atrial fibrillation: Secondary | ICD-10-CM

## 2018-10-07 NOTE — Patient Instructions (Signed)

## 2018-10-07 NOTE — Progress Notes (Signed)
Cardiology Office Note   Date:  10/07/2018   ID:  Anna Mathews, DOB December 18, 1940, MRN [id number]  PCP:  Vernie Shanks, MD    No chief complaint on file.  AFib  Wt Readings from Last 3 Encounters:  10/07/18 153 lb 12.8 oz (69.8 kg)  08/25/18 147 lb 6.4 oz (66.9 kg)  08/06/18 147 lb (66.7 kg)       History of Present Illness: Anna Mathews is a 78 y.o. female   Who has AFib, maintaining NSR on flecainide.  We tried a lower dose of flecainide in the past but she had SHOB, so resumed the current dose.   Her husband died in 07-09-15. She has adjusted well. She moved and enjoys her downsized lifestyle.  She had a fall in 5/18 and hit her face and shoulder.  Head CT was negative.  She has a dislocated right shoulder.  It is being managed conservatively.  She did PT.  In June 2019, she went out of rhythm and had a cardioversion.   She walks the dog for exercise.  Gyms are closed.    She saw Dr. Curt Bears in 2019 and medical therapy was continued.  She feels that her exercise tolerance has decreased.   She has not been going to the gym.  Denies : Chest pain. Dizziness. Leg edema. Nitroglycerin use. Orthopnea. Palpitations. Paroxysmal nocturnal dyspnea. Syncope.     Past Medical History:  Diagnosis Date  . Atrial fibrillation ([place]) 09/2009   on coumadin  . Diverticulosis 09/2006  . GERD (gastroesophageal reflux disease)   . Hiatal hernia   . Hyperlipidemia   . Hypertension   . Osteopenia   . Postmenopausal hormone replacement therapy 1989 - 07/2000    Past Surgical History:  Procedure Laterality Date  . 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 BREAST SURGERY Left 4/99   breast biopsy - fibrosis  . CARDIOVERSION N/A 07/16/2017   Procedure: CARDIOVERSION;  Surgeon: Jerline Pain, MD;  Location: [facility] ENDOSCOPY;  Service:  Cardiovascular;  Laterality: N/A;  . CESAREAN SECTION     X 2  . HYSTEROSCOPY  4/95   w/D&C     Current Outpatient Medications  Medication Sig Dispense Refill  . amLODipine (NORVASC) 5 MG tablet TAKE 1 TABLET BY MOUTH EVERY DAY 90 tablet 3  . apixaban (ELIQUIS) 5 MG TABS tablet Take 1 tablet (5 mg total) by mouth 2 (two) times daily. 60 tablet 0  . aspirin 81 MG chewable tablet Chew 81 mg by mouth daily.    . Biotin w/ Vitamins C & E (HAIR/SKIN/NAILS PO) Take 1 tablet by mouth daily.    . Calcium Carbonate-Vitamin D (CALCIUM 600+D PO) Take 600 mg by mouth 2 (two) times daily.    . flecainide (TAMBOCOR) 100 MG tablet TAKE 1 TABLET BY MOUTH TWICE A DAY 180 tablet 2  . metoprolol succinate (TOPROL-XL) 50 MG 24 hr tablet TAKE 1 TABLET BY MOUTH EVERY DAY 90 tablet 0  . Omega-3 Fatty Acids (OMEGA-3 PO) Take 1,200 mg by mouth 2 (two) times daily.     Marland Kitchen PROLIA 60 MG/ML SOSY injection INJECT 60MG  SUBCUTANEOUSLY  INTO UPPER ARM, THIGH, OR  ABDOMEN EVERY 6 MONTHS  (GIVEN AT PRESCRIBERS  OFFICE) 1 mL 1  . quinapril (ACCUPRIL) 40 MG tablet Take 40 mg by mouth daily.     Marland Kitchen  simvastatin (ZOCOR) 20 MG tablet Take 20 mg by mouth daily.     Marland Kitchen tretinoin (RETIN-A) 0.1 % cream APPLY PEA SIZED AMOUNT TO FACE AT NIGHT AS DIRECTED  2   No current facility-administered medications for this visit.     Allergies:   Penicillins, Crab [shellfish allergy], and Sulfa antibiotics    Social History:  The patient  reports that she quit smoking about 50 years ago. Her smoking use included cigarettes. She started smoking about 61 years ago. She has a 3.00 pack-year smoking history. She has never used smokeless tobacco. She reports current alcohol use. She reports that she does not use drugs.   Family History:  [name] family history includes Breast cancer in her maternal grandmother; COPD in her father; Diabetes in her maternal aunt, maternal aunt, maternal grandfather, and son; Heart disease in her father;  Hypertension in her mother; Kidney cancer in her brother; Rheum arthritis in her brother.    ROS:  Please see the history of present illness.   Otherwise, review of systems are positive for fatigue.   All other systems are reviewed and negative.    PHYSICAL EXAM: VS:  BP 118/76   Pulse (!) 59   Ht 5\' 4"  (1.626 m)   Wt 153 lb 12.8 oz (69.8 kg)   LMP 01/23/1988 (Approximate)   SpO2 96%   BMI 26.40 kg/m  , BMI Body mass index is 26.4 kg/m. GEN: Well nourished, well developed, in no acute distress  HEENT: normal  Neck: no JVD, carotid bruits, or masses Cardiac: irrefgularly irregular; no murmurs, rubs, or gallops,no edema  Respiratory:  clear to auscultation bilaterally, normal work of breathing GI: soft, nontender, nondistended, + BS MS: no deformity or atrophy  Skin: warm and dry, no rash Neuro:  Strength and sensation are intact Psych: euthymic mood, full affect   EKG:   The ekg ordered today demonstrates Atrial flutter, rate controlled.   Recent Labs: 01/16/2018: BUN 25; Creatinine, Ser 0.89; Hemoglobin 13.3; Platelets 228; Potassium 4.7; Sodium 144   Lipid Panel No results found for: CHOL, TRIG, HDL, CHOLHDL, VLDL, LDLCALC, LDLDIRECT   Other studies Reviewed: Additional studies/ records that were reviewed today with results demonstrating: prior labs reviewed.   ASSESSMENT AND PLAN:  1. PAF: Has ben on Eliquis and Flecainide.  Today is the first time she has been out of rhythm since June 2019. Tolerating Eliquis well.  Could consider amiodarone instead of flecainide.  TSH from 11/19 was normal. 2. HTN: The current medical regimen is effective;  continue present plan and medications. 3. Hyperlipidemia: LDL 69, in 05/2018.  Continue statin.  4. Atypical chest pain: noted in the poast.  Sounded musculoskeletal.  Resolved.    Current medicines are reviewed at length with the patient today.  The patient concerns regarding her medicines were addressed.  The following  changes have been made:  No change  Labs/ tests ordered today include:  No orders of the defined types were placed in this encounter.   Recommend 150 minutes/week of aerobic exercise Low fat, low carb, high fiber diet recommended  Disposition:   FU in 1 year and sooner if more AFib management needed.   Signed, Larae Grooms, MD  10/07/2018 4:54 PM    [facility] [address], [place], [place]  [address] Phone: [phone]; Fax: [phone]

## 2018-10-14 ENCOUNTER — Encounter: Payer: Self-pay | Admitting: *Deleted

## 2018-10-14 ENCOUNTER — Encounter: Payer: Self-pay | Admitting: Cardiology

## 2018-10-14 ENCOUNTER — Ambulatory Visit (INDEPENDENT_AMBULATORY_CARE_PROVIDER_SITE_OTHER): Payer: Medicare Other | Admitting: Cardiology

## 2018-10-14 ENCOUNTER — Other Ambulatory Visit: Payer: Self-pay

## 2018-10-14 VITALS — BP 128/72 | HR 84 | Ht 64.0 in | Wt 156.0 lb

## 2018-10-14 DIAGNOSIS — I483 Typical atrial flutter: Secondary | ICD-10-CM | POA: Diagnosis not present

## 2018-10-14 DIAGNOSIS — Z01812 Encounter for preprocedural laboratory examination: Secondary | ICD-10-CM

## 2018-10-14 MED ORDER — FLECAINIDE ACETATE 150 MG PO TABS: 150.0000 mg | ORAL_TABLET | Freq: Two times a day (BID) | ORAL | 3 refills | Status: DC

## 2018-10-14 NOTE — H&P (View-Only) (Signed)
Electrophysiology Office Note   Date:  10/14/2018   ID:  Anna Mathews, DOB Mar 12, 1940, MRN [id number]  PCP:  Vernie Shanks, MD  Cardiologist:  Casandra Doffing Primary Electrophysiologist:  Kimla Furth Meredith Leeds, MD    No chief complaint on file.    History of Present Illness: Anna Mathews is a 78 y.o. female who is being seen today for the evaluation of atrial fibrillation at the request of Ermalinda Barrios. Presenting today for electrophysiology evaluation.  She has a history of atrial fibrillation on flecainide, hypertension, hyperlipidemia.  She has not been feeling well for the last few months.  She had a colonoscopy about 6 weeks ago and she says that she went into atrial fibrillation at that time.  She has been more short of breath with activity such as going up and down stairs and walking her dog.  She also has quite a bit of fatigue.  She complains of palpitations when she lies on her left side. Was found to be in atrial flutter and had cardioversion 07/16/17.  Today, denies symptoms of palpitations, chest pain,  orthopnea, PND, lower extremity edema, claudication, dizziness, presyncope, syncope, bleeding, or neurologic sequela. The patient is tolerating medications without difficulties.  Main symptoms are of fatigue and shortness of breath.  She has been having these symptoms for the last few weeks.  She unfortunately has popped into atrial flutter.      Past Medical History:  Diagnosis Date  . Atrial fibrillation ([place]) 09/2009   on coumadin  . Diverticulosis 09/2006  . GERD (gastroesophageal reflux disease)   . Hiatal hernia   . Hyperlipidemia   . Hypertension   . Osteopenia   . Postmenopausal hormone replacement therapy 1989 - 07/2000   Past Surgical History:  Procedure Laterality Date  . 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 BREAST  SURGERY Left 4/99   breast biopsy - fibrosis  . CARDIOVERSION N/A 07/16/2017   Procedure: CARDIOVERSION;  Surgeon: Jerline Pain, MD;  Location: [facility] ENDOSCOPY;  Service: Cardiovascular;  Laterality: N/A;  . CESAREAN SECTION     X 2  . HYSTEROSCOPY  4/95   w/D&C     Current Outpatient Medications  Medication Sig Dispense Refill  . amLODipine (NORVASC) 5 MG tablet TAKE 1 TABLET BY MOUTH EVERY DAY 90 tablet 3  . apixaban (ELIQUIS) 5 MG TABS tablet Take 1 tablet (5 mg total) by mouth 2 (two) times daily. 60 tablet 0  . aspirin 81 MG chewable tablet Chew 81 mg by mouth daily.    . Biotin w/ Vitamins C & E (HAIR/SKIN/NAILS PO) Take 1 tablet by mouth daily.    . Calcium Carbonate-Vitamin D (CALCIUM 600+D PO) Take 600 mg by mouth 2 (two) times daily.    . metoprolol succinate (TOPROL-XL) 50 MG 24 hr tablet TAKE 1 TABLET BY MOUTH EVERY DAY 90 tablet 0  . Omega-3 Fatty Acids (OMEGA-3 PO) Take 1,200 mg by mouth 2 (two) times daily.     Marland Kitchen PROLIA 60 MG/ML SOSY injection INJECT 60MG  SUBCUTANEOUSLY  INTO UPPER ARM, THIGH, OR  ABDOMEN EVERY 6 MONTHS  (GIVEN AT PRESCRIBERS  OFFICE) 1 mL 1  . quinapril (ACCUPRIL) 40 MG tablet Take 40 mg by mouth daily.     . simvastatin (ZOCOR) 20 MG tablet Take 20 mg by mouth daily.     Marland Kitchen  tretinoin (RETIN-A) 0.1 % cream APPLY PEA SIZED AMOUNT TO FACE AT NIGHT AS DIRECTED  2  . flecainide (TAMBOCOR) 150 MG tablet Take 1 tablet (150 mg total) by mouth 2 (two) times daily. 60 tablet 3   No current facility-administered medications for this visit.     Allergies:   Penicillins, Crab [shellfish allergy], and Sulfa antibiotics   Social History:  The patient  reports that she quit smoking about 50 years ago. Her smoking use included cigarettes. She started smoking about 61 years ago. She has a 3.00 pack-year smoking history. She has never used smokeless tobacco. She reports current alcohol use. She reports that she does not use drugs.   Family History:  [name] family  history includes Breast cancer in her maternal grandmother; COPD in her father; Diabetes in her maternal aunt, maternal aunt, maternal grandfather, and son; Heart disease in her father; Hypertension in her mother; Kidney cancer in her brother; Rheum arthritis in her brother.   ROS:  Please see the history of present illness.   Otherwise, review of systems is positive for none.   All other systems are reviewed and negative.   PHYSICAL EXAM: VS:  BP 128/72   Pulse 84   Ht 5\' 4"  (1.626 m)   Wt 156 lb (70.8 kg)   LMP 01/23/1988 (Approximate)   SpO2 95%   BMI 26.78 kg/m  , BMI Body mass index is 26.78 kg/m. GEN: Well nourished, well developed, in no acute distress  HEENT: normal  Neck: no JVD, carotid bruits, or masses Cardiac: RRR; no murmurs, rubs, or gallops,no edema  Respiratory:  clear to auscultation bilaterally, normal work of breathing GI: soft, nontender, nondistended, + BS MS: no deformity or atrophy  Skin: warm and dry Neuro:  Strength and sensation are intact Psych: euthymic mood, full affect  EKG:  EKG is not ordered today. Personal review of the ekg ordered 07/08/18 shows atrial flutter, rate 59    Recent Labs: 01/16/2018: BUN 25; Creatinine, Ser 0.89; Hemoglobin 13.3; Platelets 228; Potassium 4.7; Sodium 144    Lipid Panel  No results found for: CHOL, TRIG, HDL, CHOLHDL, VLDL, LDLCALC, LDLDIRECT   Wt Readings from Last 3 Encounters:  10/14/18 156 lb (70.8 kg)  10/07/18 153 lb 12.8 oz (69.8 kg)  08/25/18 147 lb 6.4 oz (66.9 kg)      Other studies Reviewed: Additional studies/ records that were reviewed today include: TTE 2014  Review of the above records today demonstrates:  LVEF 60 to 65%, mild MR, aortic valve is sclerotic but opens well, impaired relaxation consistent with age.    ASSESSMENT AND PLAN:  1.  Paroxysmal atrial fibrillation/flutter: He on flecainide, Eliquis, Toprol-XL.  Unfortunately she is in atrial flutter today and is having symptoms of  weakness and fatigue.  Due to that, we Berley Gambrell increase her flecainide to 150 mg twice a day.  We Remmington Teters also plan to cardiovert her I Kade Demicco see her back in 3 months.  This patients CHA2DS2-VASc Score and unadjusted Ischemic Stroke Rate (% per year) is equal to 4.8 % stroke rate/year from a score of 4  Above score calculated as 1 point each if present [CHF, HTN, DM, Vascular=MI/PAD/Aortic Plaque, Age if 65-74, or Female] Above score calculated as 2 points each if present [Age > 75, or Stroke/TIA/TE]   2.  Hypertension: Currently well controlled  3.  Hyperlipidemia: Continue Zocor  Current medicines are reviewed at length with the patient today.   The patient does not  have concerns regarding her medicines.  The following changes were made today: Increase flecainide  Labs/ tests ordered today include:  Orders Placed This Encounter  Procedures  . Basic metabolic panel  . CBC   Disposition:   FU with Marcine Gadway 3 months  Signed, Dayanira Giovannetti Meredith Leeds, MD  10/14/2018 2:33 PM     [facility] [address] [place] [address] [phone] (office) [phone] (fax)

## 2018-10-14 NOTE — Progress Notes (Deleted)
Electrophysiology Office Note   Date:  10/14/2018   ID:  Anna Mathews, DOB 01-18-1941, MRN [id number]  PCP:  Vernie Shanks, MD  Cardiologist:  Casandra Doffing Primary Electrophysiologist:  Samarth Ogle Meredith Leeds, MD    No chief complaint on file.    History of Present Illness: Anna Mathews is a 78 y.o. female who is being seen today for the evaluation of atrial fibrillation at the request of Ermalinda Barrios. Presenting today for electrophysiology evaluation.  She has a history of atrial fibrillation on flecainide, hypertension, hyperlipidemia.  She has not been feeling well for the last few months.  She had a colonoscopy about 6 weeks ago and she says that she went into atrial fibrillation at that time.  She has been more short of breath with activity such as going up and down stairs and walking her dog.  She also has quite a bit of fatigue.  She complains of palpitations when she lies on her left side. Was found to be in atrial flutter and had cardioversion 07/16/17.  Today, denies symptoms of palpitations, chest pain, shortness of breath, orthopnea, PND, lower extremity edema, claudication, dizziness, presyncope, syncope, bleeding, or neurologic sequela. The patient is tolerating medications without difficulties. ***      Past Medical History:  Diagnosis Date  . Atrial fibrillation ([place]) 09/2009   on coumadin  . Diverticulosis 09/2006  . GERD (gastroesophageal reflux disease)   . Hiatal hernia   . Hyperlipidemia   . Hypertension   . Osteopenia   . Postmenopausal hormone replacement therapy 1989 - 07/2000   Past Surgical History:  Procedure Laterality Date  . 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 BREAST SURGERY Left 4/99   breast biopsy - fibrosis  . CARDIOVERSION N/A 07/16/2017   Procedure: CARDIOVERSION;  Surgeon: Jerline Pain, MD;   Location: [facility] ENDOSCOPY;  Service: Cardiovascular;  Laterality: N/A;  . CESAREAN SECTION     X 2  . HYSTEROSCOPY  4/95   w/D&C     Current Outpatient Medications  Medication Sig Dispense Refill  . amLODipine (NORVASC) 5 MG tablet TAKE 1 TABLET BY MOUTH EVERY DAY 90 tablet 3  . apixaban (ELIQUIS) 5 MG TABS tablet Take 1 tablet (5 mg total) by mouth 2 (two) times daily. 60 tablet 0  . aspirin 81 MG chewable tablet Chew 81 mg by mouth daily.    . Biotin w/ Vitamins C & E (HAIR/SKIN/NAILS PO) Take 1 tablet by mouth daily.    . Calcium Carbonate-Vitamin D (CALCIUM 600+D PO) Take 600 mg by mouth 2 (two) times daily.    . flecainide (TAMBOCOR) 100 MG tablet TAKE 1 TABLET BY MOUTH TWICE A DAY 180 tablet 2  . metoprolol succinate (TOPROL-XL) 50 MG 24 hr tablet TAKE 1 TABLET BY MOUTH EVERY DAY 90 tablet 0  . Omega-3 Fatty Acids (OMEGA-3 PO) Take 1,200 mg by mouth 2 (two) times daily.     Marland Kitchen PROLIA 60 MG/ML SOSY injection INJECT 60MG  SUBCUTANEOUSLY  INTO UPPER ARM, THIGH, OR  ABDOMEN EVERY 6 MONTHS  (GIVEN AT PRESCRIBERS  OFFICE) 1 mL 1  . quinapril (ACCUPRIL) 40 MG tablet Take 40 mg by mouth daily.     . simvastatin (ZOCOR) 20 MG tablet Take 20 mg by mouth daily.     Marland Kitchen tretinoin (RETIN-A) 0.1 % cream APPLY PEA  SIZED AMOUNT TO FACE AT NIGHT AS DIRECTED  2   No current facility-administered medications for this visit.     Allergies:   Penicillins, Crab [shellfish allergy], and Sulfa antibiotics   Social History:  The patient  reports that she quit smoking about 50 years ago. Her smoking use included cigarettes. She started smoking about 61 years ago. She has a 3.00 pack-year smoking history. She has never used smokeless tobacco. She reports current alcohol use. She reports that she does not use drugs.   Family History:  [name] family history includes Breast cancer in her maternal grandmother; COPD in her father; Diabetes in her maternal aunt, maternal aunt, maternal grandfather, and son; Heart  disease in her father; Hypertension in her mother; Kidney cancer in her brother; Rheum arthritis in her brother.   ROS:  Please see the history of present illness.   Otherwise, review of systems is positive for ***.   All other systems are reviewed and negative.   PHYSICAL EXAM: VS:  LMP 01/23/1988 (Approximate)  , BMI There is no height or weight on file to calculate BMI. GEN: Well nourished, well developed, in no acute distress  HEENT: normal  Neck: no JVD, carotid bruits, or masses Cardiac: ***RRR; no murmurs, rubs, or gallops,no edema  Respiratory:  clear to auscultation bilaterally, normal work of breathing GI: soft, nontender, nondistended, + BS MS: no deformity or atrophy  Skin: warm and dry Neuro:  Strength and sensation are intact Psych: euthymic mood, full affect  EKG:  EKG {ACTION; IS/IS [id number] ordered today. Personal review of the ekg ordered *** shows ***    Recent Labs: 01/16/2018: BUN 25; Creatinine, Ser 0.89; Hemoglobin 13.3; Platelets 228; Potassium 4.7; Sodium 144    Lipid Panel  No results found for: CHOL, TRIG, HDL, CHOLHDL, VLDL, LDLCALC, LDLDIRECT   Wt Readings from Last 3 Encounters:  10/07/18 153 lb 12.8 oz (69.8 kg)  08/25/18 147 lb 6.4 oz (66.9 kg)  08/06/18 147 lb (66.7 kg)      Other studies Reviewed: Additional studies/ records that were reviewed today include: TTE 2014  Review of the above records today demonstrates:  LVEF 60 to 65%, mild MR, aortic valve is sclerotic but opens well, impaired relaxation consistent with age.    ASSESSMENT AND PLAN:  1.  Paroxysmal atrial fibrillation: He on flecainide, Eliquis, Toprol-XL.  ***  This patients CHA2DS2-VASc Score and unadjusted Ischemic Stroke Rate (% per year) is equal to 4.8 % stroke rate/year from a score of 4  Above score calculated as 1 point each if present [CHF, HTN, DM, Vascular=MI/PAD/Aortic Plaque, Age if 65-74, or Female] Above score calculated as 2 points each if present  [Age > 75, or Stroke/TIA/TE]   2.  Hypertension: ***  3.  Hyperlipidemia: Continue Zocor  Current medicines are reviewed at length with the patient today.   The patient does not have concerns regarding her medicines.  The following changes were made today: ***  Labs/ tests ordered today include:  No orders of the defined types were placed in this encounter.  Disposition:   FU with Nyaire Denbleyker *** months  Signed, Lennan Malone Meredith Leeds, MD  10/14/2018 11:42 AM     [facility] [address] [place] [address] [phone] (office) [phone] (fax)

## 2018-10-14 NOTE — Patient Instructions (Addendum)
Medication Instructions:  Your physician has recommended you make the following change in your medication:  1. INCREASE Flecainide to 150 mg TWICE a day  * If you need a refill on your cardiac medications before your next appointment, please call your pharmacy.   Labwork: Pre procedure blood work today: BMET & CBC If you have labs (blood work) drawn today and your tests are completely normal, you will receive your results only by:  [facility] (if you have MyChart) OR  A paper copy in the mail If you have any lab test that is abnormal or we need to change your treatment, we will call you to review the results.  Testing/Procedures: Your physician has recommended that you have a Cardioversion (DCCV). Electrical Cardioversion uses a jolt of electricity to your heart either through paddles or wired patches attached to your chest. This is a controlled, usually prescheduled, procedure. Defibrillation is done under light anesthesia in the hospital, and you usually go home the day of the procedure. This is done to get your heart back into a normal rhythm. You are not awake for the procedure. Please see the instruction sheet given to you today.   Follow-Up: Your physician recommends that you schedule a follow-up appointment in: 3 months with Dr. Curt Bears.  *Please note that any paperwork needing to be filled out by the provider will need to be addressed at the front desk prior to seeing the provider. Please note that any FMLA, disability or other documents regarding health condition is subject to a $25.00 charge that must be received prior to completion of paperwork in the form of a money order or check.  Thank you for choosing [facility]!!   Trinidad Curet, RN [phone]  Any Other Special Instructions Will Be Listed Below (If Applicable).    COVID TEST-- On 10/17/18 @ 3:00 pm - You will go to [facility] ([place]) for your Covid testing.   This is a drive  thru test site.  There will be multiple testing areas.  Be sure to share with the first checkpoint that you are there for pre-procedure/surgery testing. This will put you into the right (yellow) lane that leads to the PAT testing team. Stay in your car and the nurse team will come to your car to test you.  After you are tested please go home and self quarantine until the day of your procedure.      CARDIOVERSION You are scheduled for a Cardioversion  on 10/20/18 with Dr. Harrell Gave. Please arrive at the Main Entrance, Entrance "A" of [facility] at 9:00  a.m.Marland Kitchen on the day of your procedure.  DIET:            A) Nothing to eat or drink after midnight except your medications with a                  sip of water.  1) Come to the office on 10/14/18 for lab work. You do not have to be fasting.  2) MAKE SURE YOU TAKE YOUR ELIQUIS.  3)  YOU MAY TAKE ALL of your medications with a small amount of water.   4) Must have a responsible person to drive you home.  5) Bring a current list of your medications and current insurance cards.   * Special note: Every effort is made to have your procedure done on time. Occasionally there are emergencies that present themselves at the hospital that may cause delays. Please  be patient if a delay does occur.  If you have any questions after you get home, please call the office at [phone]      Electrical Cardioversion  Electrical cardioversion is the delivery of a jolt of electricity to restore a normal rhythm to the heart. A rhythm that is too fast or is not regular keeps the heart from pumping well. In this procedure, sticky patches or metal paddles are placed on the chest to deliver electricity to the heart from a device. This procedure may be done in an emergency if:  There is low or no blood pressure as a result of the heart rhythm.  Normal rhythm must be restored as fast as possible to protect the brain and heart from further damage.  It  may save a life. This procedure may also be done for irregular or fast heart rhythms that are not immediately life-threatening. Tell a health care provider about:  Any allergies you have.  All medicines you are taking, including vitamins, herbs, eye drops, creams, and over-the-counter medicines.  Any problems you or family members have had with anesthetic medicines.  Any blood disorders you have.  Any surgeries you have had.  Any medical conditions you have.  Whether you are pregnant or may be pregnant. What are the risks? Generally, this is a safe procedure. However, problems may occur, including:  Allergic reactions to medicines.  A blood clot that breaks free and travels to other parts of your body.  The possible return of an abnormal heart rhythm within hours or days after the procedure.  Your heart stopping (cardiac arrest). This is rare. What happens before the procedure? Medicines  Your health care provider may have you start taking: ? Blood-thinning medicines (anticoagulants) so your blood does not clot as easily. ? Medicines may be given to help stabilize your heart rate and rhythm.  Ask your health care provider about changing or stopping your regular medicines. This is especially important if you are taking diabetes medicines or blood thinners. General instructions  Plan to have someone take you home from the hospital or clinic.  If you will be going home right after the procedure, plan to have someone with you for 24 hours.  Follow instructions from your health care provider about eating or drinking restrictions. What happens during the procedure?  To lower your risk of infection: ? Your health care team will wash or sanitize their hands. ? Your skin will be washed with soap.  An IV tube will be inserted into one of your veins.  You will be given a medicine to help you relax (sedative).  Sticky patches (electrodes) or metal paddles may be placed on  your chest.  An electrical shock will be delivered. The procedure may vary among health care providers and hospitals. What happens after the procedure?   Your blood pressure, heart rate, breathing rate, and blood oxygen level will be monitored until the medicines you were given have worn off.  Do not drive for 24 hours if you were given a sedative.  Your heart rhythm will be watched to make sure it does not change. This information is not intended to replace advice given to you by your health care provider. Make sure you discuss any questions you have with your health care provider. Document Released: 12/29/2001 Document Revised: 12/21/2016 Document Reviewed: 07/15/2015 Elsevier Patient Education  2020 [employer].

## 2018-10-14 NOTE — Progress Notes (Signed)
Electrophysiology Office Note   Date:  10/14/2018   ID:  Anna Mathews, DOB 24-Jan-1940, MRN [id number]  PCP:  Vernie Shanks, MD  Cardiologist:  Casandra Doffing Primary Electrophysiologist:  Harlym Gehling Meredith Leeds, MD    No chief complaint on file.    History of Present Illness: Anna Mathews is a 78 y.o. female who is being seen today for the evaluation of atrial fibrillation at the request of Ermalinda Barrios. Presenting today for electrophysiology evaluation.  She has a history of atrial fibrillation on flecainide, hypertension, hyperlipidemia.  She has not been feeling well for the last few months.  She had a colonoscopy about 6 weeks ago and she says that she went into atrial fibrillation at that time.  She has been more short of breath with activity such as going up and down stairs and walking her dog.  She also has quite a bit of fatigue.  She complains of palpitations when she lies on her left side. Was found to be in atrial flutter and had cardioversion 07/16/17.  Today, denies symptoms of palpitations, chest pain,  orthopnea, PND, lower extremity edema, claudication, dizziness, presyncope, syncope, bleeding, or neurologic sequela. The patient is tolerating medications without difficulties.  Main symptoms are of fatigue and shortness of breath.  She has been having these symptoms for the last few weeks.  She unfortunately has popped into atrial flutter.      Past Medical History:  Diagnosis Date  . Atrial fibrillation ([place]) 09/2009   on coumadin  . Diverticulosis 09/2006  . GERD (gastroesophageal reflux disease)   . Hiatal hernia   . Hyperlipidemia   . Hypertension   . Osteopenia   . Postmenopausal hormone replacement therapy 1989 - 07/2000   Past Surgical History:  Procedure Laterality Date  . 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 BREAST  SURGERY Left 4/99   breast biopsy - fibrosis  . CARDIOVERSION N/A 07/16/2017   Procedure: CARDIOVERSION;  Surgeon: Jerline Pain, MD;  Location: [facility] ENDOSCOPY;  Service: Cardiovascular;  Laterality: N/A;  . CESAREAN SECTION     X 2  . HYSTEROSCOPY  4/95   w/D&C     Current Outpatient Medications  Medication Sig Dispense Refill  . amLODipine (NORVASC) 5 MG tablet TAKE 1 TABLET BY MOUTH EVERY DAY 90 tablet 3  . apixaban (ELIQUIS) 5 MG TABS tablet Take 1 tablet (5 mg total) by mouth 2 (two) times daily. 60 tablet 0  . aspirin 81 MG chewable tablet Chew 81 mg by mouth daily.    . Biotin w/ Vitamins C & E (HAIR/SKIN/NAILS PO) Take 1 tablet by mouth daily.    . Calcium Carbonate-Vitamin D (CALCIUM 600+D PO) Take 600 mg by mouth 2 (two) times daily.    . metoprolol succinate (TOPROL-XL) 50 MG 24 hr tablet TAKE 1 TABLET BY MOUTH EVERY DAY 90 tablet 0  . Omega-3 Fatty Acids (OMEGA-3 PO) Take 1,200 mg by mouth 2 (two) times daily.     Marland Kitchen PROLIA 60 MG/ML SOSY injection INJECT 60MG  SUBCUTANEOUSLY  INTO UPPER ARM, THIGH, OR  ABDOMEN EVERY 6 MONTHS  (GIVEN AT PRESCRIBERS  OFFICE) 1 mL 1  . quinapril (ACCUPRIL) 40 MG tablet Take 40 mg by mouth daily.     . simvastatin (ZOCOR) 20 MG tablet Take 20 mg by mouth daily.     Marland Kitchen  tretinoin (RETIN-A) 0.1 % cream APPLY PEA SIZED AMOUNT TO FACE AT NIGHT AS DIRECTED  2  . flecainide (TAMBOCOR) 150 MG tablet Take 1 tablet (150 mg total) by mouth 2 (two) times daily. 60 tablet 3   No current facility-administered medications for this visit.     Allergies:   Penicillins, Crab [shellfish allergy], and Sulfa antibiotics   Social History:  The patient  reports that she quit smoking about 50 years ago. Her smoking use included cigarettes. She started smoking about 61 years ago. She has a 3.00 pack-year smoking history. She has never used smokeless tobacco. She reports current alcohol use. She reports that she does not use drugs.   Family History:  [name] family  history includes Breast cancer in her maternal grandmother; COPD in her father; Diabetes in her maternal aunt, maternal aunt, maternal grandfather, and son; Heart disease in her father; Hypertension in her mother; Kidney cancer in her brother; Rheum arthritis in her brother.   ROS:  Please see the history of present illness.   Otherwise, review of systems is positive for none.   All other systems are reviewed and negative.   PHYSICAL EXAM: VS:  BP 128/72   Pulse 84   Ht 5\' 4"  (1.626 m)   Wt 156 lb (70.8 kg)   LMP 01/23/1988 (Approximate)   SpO2 95%   BMI 26.78 kg/m  , BMI Body mass index is 26.78 kg/m. GEN: Well nourished, well developed, in no acute distress  HEENT: normal  Neck: no JVD, carotid bruits, or masses Cardiac: RRR; no murmurs, rubs, or gallops,no edema  Respiratory:  clear to auscultation bilaterally, normal work of breathing GI: soft, nontender, nondistended, + BS MS: no deformity or atrophy  Skin: warm and dry Neuro:  Strength and sensation are intact Psych: euthymic mood, full affect  EKG:  EKG is not ordered today. Personal review of the ekg ordered 07/08/18 shows atrial flutter, rate 59    Recent Labs: 01/16/2018: BUN 25; Creatinine, Ser 0.89; Hemoglobin 13.3; Platelets 228; Potassium 4.7; Sodium 144    Lipid Panel  No results found for: CHOL, TRIG, HDL, CHOLHDL, VLDL, LDLCALC, LDLDIRECT   Wt Readings from Last 3 Encounters:  10/14/18 156 lb (70.8 kg)  10/07/18 153 lb 12.8 oz (69.8 kg)  08/25/18 147 lb 6.4 oz (66.9 kg)      Other studies Reviewed: Additional studies/ records that were reviewed today include: TTE 2014  Review of the above records today demonstrates:  LVEF 60 to 65%, mild MR, aortic valve is sclerotic but opens well, impaired relaxation consistent with age.    ASSESSMENT AND PLAN:  1.  Paroxysmal atrial fibrillation/flutter: He on flecainide, Eliquis, Toprol-XL.  Unfortunately she is in atrial flutter today and is having symptoms of  weakness and fatigue.  Due to that, we Mattisyn Cardona increase her flecainide to 150 mg twice a day.  We Kelten Enochs also plan to cardiovert her I Delila Kuklinski see her back in 3 months.  This patients CHA2DS2-VASc Score and unadjusted Ischemic Stroke Rate (% per year) is equal to 4.8 % stroke rate/year from a score of 4  Above score calculated as 1 point each if present [CHF, HTN, DM, Vascular=MI/PAD/Aortic Plaque, Age if 65-74, or Female] Above score calculated as 2 points each if present [Age > 75, or Stroke/TIA/TE]   2.  Hypertension: Currently well controlled  3.  Hyperlipidemia: Continue Zocor  Current medicines are reviewed at length with the patient today.   The patient does not  have concerns regarding her medicines.  The following changes were made today: Increase flecainide  Labs/ tests ordered today include:  Orders Placed This Encounter  Procedures  . Basic metabolic panel  . CBC   Disposition:   FU with Brittannie Tawney 3 months  Signed, Rena Hunke Meredith Leeds, MD  10/14/2018 2:33 PM     [facility] [address] [place] [address] [phone] (office) [phone] (fax)

## 2018-10-15 LAB — BASIC METABOLIC PANEL
BUN/Creatinine Ratio: 27 (ref 12–28)
BUN: 26 mg/dL (ref 8–27)
CO2: 25 mmol/L (ref 20–29)
Calcium: 9.7 mg/dL (ref 8.7–10.3)
Chloride: 104 mmol/L (ref 96–106)
Creatinine, Ser: 0.95 mg/dL (ref 0.57–1.00)
GFR calc Af Amer: 66 mL/min/{1.73_m2} (ref >59)
GFR calc non Af Amer: 57 mL/min/{1.73_m2} — ABNORMAL LOW (ref >59)
Glucose: 124 mg/dL — ABNORMAL HIGH (ref 65–99)
Potassium: 5.4 mmol/L — ABNORMAL HIGH (ref 3.5–5.2)
Sodium: 141 mmol/L (ref 134–144)

## 2018-10-15 LAB — CBC
Hematocrit: 42.5 % (ref 34.0–46.6)
Hemoglobin: 14.5 g/dL (ref 11.1–15.9)
MCH: 31 pg (ref 26.6–33.0)
MCHC: 34.1 g/dL (ref 31.5–35.7)
MCV: 91 fL (ref 79–97)
Platelets: 230 10*3/uL (ref 150–450)
RBC: 4.68 x10E6/uL (ref 3.77–5.28)
RDW: 12.8 % (ref 11.7–15.4)
WBC: 8.7 10*3/uL (ref 3.4–10.8)

## 2018-10-17 ENCOUNTER — Other Ambulatory Visit (HOSPITAL_COMMUNITY)
Admission: RE | Admit: 2018-10-17 | Discharge: 2018-10-17 | Disposition: A | Discharge status: Home / Self Care | Payer: Medicare Other | Source: Ambulatory Visit | Attending: Cardiology | Admitting: Cardiology

## 2018-10-17 ENCOUNTER — Other Ambulatory Visit: Payer: Self-pay | Admitting: Obstetrics and Gynecology

## 2018-10-17 DIAGNOSIS — Z20828 Contact with and (suspected) exposure to other viral communicable diseases: Secondary | ICD-10-CM | POA: Insufficient documentation

## 2018-10-17 DIAGNOSIS — Z01812 Encounter for preprocedural laboratory examination: Secondary | ICD-10-CM | POA: Diagnosis present

## 2018-10-17 NOTE — Telephone Encounter (Signed)
Patient is seeing endocrinologist for management of osteoporosis with Prolia.   Rx refused.   Routing to Dr. Rosann Auerbach.   Encounter closed.

## 2018-10-19 LAB — NOVEL CORONAVIRUS, NAA (HOSP ORDER, SEND-OUT TO REF LAB; TAT 18-24 HRS): SARS-CoV-2, NAA: NOT DETECTED

## 2018-10-20 ENCOUNTER — Encounter (HOSPITAL_COMMUNITY): Payer: Self-pay

## 2018-10-20 ENCOUNTER — Ambulatory Visit (HOSPITAL_COMMUNITY): Payer: Medicare Other | Admitting: Certified Registered"

## 2018-10-20 ENCOUNTER — Other Ambulatory Visit: Payer: Self-pay

## 2018-10-20 ENCOUNTER — Encounter (HOSPITAL_COMMUNITY)
Admission: RE | Disposition: A | Discharge status: Home / Self Care | Payer: Self-pay | Source: Home / Self Care | Attending: Cardiology

## 2018-10-20 ENCOUNTER — Ambulatory Visit (HOSPITAL_COMMUNITY)
Admission: RE | Admit: 2018-10-20 | Discharge: 2018-10-20 | Disposition: A | Discharge status: Home / Self Care | Payer: Medicare Other | Attending: Cardiology | Admitting: Cardiology

## 2018-10-20 ENCOUNTER — Ambulatory Visit (HOSPITAL_BASED_OUTPATIENT_CLINIC_OR_DEPARTMENT_OTHER)
Admission: RE | Admit: 2018-10-20 | Discharge: 2018-10-20 | Disposition: A | Discharge status: Home / Self Care | Payer: Medicare Other | Source: Home / Self Care | Attending: Cardiology | Admitting: Cardiology

## 2018-10-20 DIAGNOSIS — I340 Nonrheumatic mitral (valve) insufficiency: Secondary | ICD-10-CM | POA: Diagnosis not present

## 2018-10-20 DIAGNOSIS — Z7982 Long term (current) use of aspirin: Secondary | ICD-10-CM | POA: Insufficient documentation

## 2018-10-20 DIAGNOSIS — I10 Essential (primary) hypertension: Secondary | ICD-10-CM | POA: Insufficient documentation

## 2018-10-20 DIAGNOSIS — Z882 Allergy status to sulfonamides status: Secondary | ICD-10-CM | POA: Insufficient documentation

## 2018-10-20 DIAGNOSIS — E785 Hyperlipidemia, unspecified: Secondary | ICD-10-CM | POA: Diagnosis not present

## 2018-10-20 DIAGNOSIS — Z7901 Long term (current) use of anticoagulants: Secondary | ICD-10-CM | POA: Insufficient documentation

## 2018-10-20 DIAGNOSIS — I480 Paroxysmal atrial fibrillation: Secondary | ICD-10-CM | POA: Diagnosis present

## 2018-10-20 DIAGNOSIS — Z880 Allergy status to penicillin: Secondary | ICD-10-CM | POA: Insufficient documentation

## 2018-10-20 DIAGNOSIS — Z79899 Other long term (current) drug therapy: Secondary | ICD-10-CM | POA: Diagnosis not present

## 2018-10-20 DIAGNOSIS — I4891 Unspecified atrial fibrillation: Secondary | ICD-10-CM

## 2018-10-20 DIAGNOSIS — K219 Gastro-esophageal reflux disease without esophagitis: Secondary | ICD-10-CM | POA: Diagnosis not present

## 2018-10-20 DIAGNOSIS — I484 Atypical atrial flutter: Secondary | ICD-10-CM | POA: Diagnosis not present

## 2018-10-20 DIAGNOSIS — Z87891 Personal history of nicotine dependence: Secondary | ICD-10-CM | POA: Diagnosis not present

## 2018-10-20 DIAGNOSIS — I4892 Unspecified atrial flutter: Secondary | ICD-10-CM | POA: Insufficient documentation

## 2018-10-20 HISTORY — PX: TEE WITHOUT CARDIOVERSION: SHX5443

## 2018-10-20 HISTORY — PX: CARDIOVERSION: SHX1299

## 2018-10-20 SURGERY — ECHOCARDIOGRAM, TRANSESOPHAGEAL
Anesthesia: General

## 2018-10-20 MED ORDER — PHENYLEPHRINE 40 MCG/ML (10ML) SYRINGE FOR IV PUSH (FOR BLOOD PRESSURE SUPPORT)
PREFILLED_SYRINGE | INTRAVENOUS | Status: DC | PRN
  Administered 2018-10-20 (×2): 80 ug via INTRAVENOUS

## 2018-10-20 MED ORDER — SODIUM CHLORIDE 0.9 % IV SOLN
INTRAVENOUS | Status: DC | PRN
  Administered 2018-10-20: 09:00:00 via INTRAVENOUS

## 2018-10-20 MED ORDER — SODIUM CHLORIDE 0.9 % IV SOLN
INTRAVENOUS | Status: DC
  Administered 2018-10-20: 09:00:00 via INTRAVENOUS

## 2018-10-20 MED ORDER — PROPOFOL 10 MG/ML IV BOLUS
INTRAVENOUS | Status: DC | PRN
  Administered 2018-10-20: 70 mg via INTRAVENOUS
  Administered 2018-10-20: 30 mg via INTRAVENOUS
  Administered 2018-10-20: 50 mg via INTRAVENOUS
  Administered 2018-10-20: 20 mg via INTRAVENOUS

## 2018-10-20 MED ORDER — LIDOCAINE 2% (20 MG/ML) 5 ML SYRINGE
INTRAMUSCULAR | Status: DC | PRN
  Administered 2018-10-20: 50 mg via INTRAVENOUS

## 2018-10-20 NOTE — Anesthesia Procedure Notes (Signed)
Procedure Name: MAC Date/Time: 10/20/2018 9:42 AM Performed by: Orlie Dakin, CRNA Pre-anesthesia Checklist: Patient identified, Suction available, Emergency Drugs available and Patient being monitored Oxygen Delivery Method: Nasal cannula Preoxygenation: Pre-oxygenation with 100% oxygen Induction Type: IV induction

## 2018-10-20 NOTE — Anesthesia Postprocedure Evaluation (Signed)
Anesthesia Post Note  Patient: Anna Mathews  Procedure(s) Performed: TRANSESOPHAGEAL ECHOCARDIOGRAM (TEE) (N/A ) CARDIOVERSION (N/A )     Patient location during evaluation: Endoscopy Anesthesia Type: General Level of consciousness: awake and alert, patient cooperative and oriented Pain management: pain level controlled Respiratory status: nonlabored ventilation, spontaneous breathing and respiratory function stable Cardiovascular status: blood pressure returned to baseline and stable Postop Assessment: no apparent nausea or vomiting Anesthetic complications: no    Last Vitals:  Vitals:   10/20/18 1035 10/20/18 1040  BP: (!) 123/40 (!) 124/54  Pulse: (!) 46 (!) 48  Resp: 15 19  Temp:    SpO2: 99% 97%    Last Pain:  Vitals:   10/20/18 1000  TempSrc: Oral  PainSc: 0-No pain                 Rain Friedt,E. Debroah Shuttleworth

## 2018-10-20 NOTE — Interval H&P Note (Signed)
History and Physical Interval Note:  10/20/2018 9:13 AM  Anna Mathews  has presented today for surgery, with the diagnosis of AFLUTTER.  The various methods of treatment have been discussed with the patient and family. After consideration of risks, benefits and other options for treatment, the patient has consented to  Procedure(s): TRANSESOPHAGEAL ECHOCARDIOGRAM (TEE) (N/A) CARDIOVERSION (N/A) as a surgical intervention.  [name] history has been reviewed, patient examined, no change in status, stable for surgery.  I have reviewed the patient's chart and labs.  Questions were answered to the patient's satisfaction.     Brayon Bielefeld Harrell Gave

## 2018-10-20 NOTE — Transfer of Care (Signed)
Immediate Anesthesia Transfer of Care Note  Patient: Anna Mathews  Procedure(s) Performed: TRANSESOPHAGEAL ECHOCARDIOGRAM (TEE) (N/A ) CARDIOVERSION (N/A )  Patient Location: Endoscopy Unit  Anesthesia Type:General  Level of Consciousness: drowsy and patient cooperative  Airway & Oxygen Therapy: Patient Spontanous Breathing and Patient connected to nasal cannula oxygen  Post-op Assessment: Report given to RN and Post -op Vital signs reviewed and stable  Post vital signs: Reviewed and stable  Last Vitals:  Vitals Value Taken Time  BP 86/36 10/20/18 0959  Temp    Pulse 45 10/20/18 1000  Resp 19 10/20/18 1000  SpO2 100 % 10/20/18 1000  Vitals shown include unvalidated device data.  Last Pain:  Vitals:   10/20/18 0831  TempSrc: Oral  PainSc: 0-No pain         Complications: No apparent anesthesia complications

## 2018-10-20 NOTE — CV Procedure (Signed)
   TRANSESOPHAGEAL ECHOCARDIOGRAM GUIDED DIRECT CURRENT CARDIOVERSION  NAME:  GREENLY LLERA   MRN: [id number] DOB:  1940-05-31   ADMIT DATE: 10/20/2018  INDICATIONS: Symptomatic atrial fib/flutter  PROCEDURE:   Informed consent was obtained prior to the procedure. The risks, benefits and alternatives for the procedure were discussed and the patient comprehended these risks.  Risks include, but are not limited to, cough, sore throat, vomiting, nausea, somnolence, esophageal and stomach trauma or perforation, bleeding, low blood pressure, aspiration, pneumonia, infection, trauma to the teeth and death.    After a procedural time-out, the oropharynx was anesthetized and the patient was sedated by the anesthesia service. The transesophageal probe was inserted in the esophagus and stomach without difficulty and multiple views were obtained. Anesthesia was monitored by Dr. Glennon Mac.   COMPLICATIONS:    Complications: No complications Patient tolerated procedure well.  FINDINGS:  LEFT VENTRICLE: EF = ~55%. No regional wall motion abnormalities.  RIGHT VENTRICLE: Normal size and function.   LEFT ATRIUM: No thrombus/mass.  LEFT ATRIAL APPENDAGE: No thrombus/mass.   RIGHT ATRIUM: No thrombus/mass.  AORTIC VALVE:  Trileaflet. No regurgitation. No vegetation. Lambl's excresences seen on aortic side of valve, very small.  MITRAL VALVE:    Normal structure. Mild regurgitation. No vegetation.  TRICUSPID VALVE: Normal structure. Mild regurgitation. No vegetation.  PULMONIC VALVE: Grossly normal structure. Trivial regurgitation. No apparent vegetation.  INTERATRIAL SEPTUM: No PFO or ASD seen by color Doppler.  PERICARDIUM: No effusion noted.  DESCENDING AORTA: Moderate diffuse plaque seen   CARDIOVERSION:     Indications:  Symptomatic Atrial Flutter  Procedure Details:  Once the TEE was complete, the patient had the defibrillator pads placed in the anterior and posterior position.  Once an appropriate level of sedation was confirmed, the patient was cardioverted x 1 with 120J of biphasic synchronized energy.  The patient converted to sinus bradycardia.  There were no apparent complications.  The patient had normal neuro status and respiratory status post procedure with vitals stable as recorded elsewhere.  Adequate airway was maintained throughout and vital signs monitored per protocol.  Buford Dresser, MD, PhD [facility]  [address], [place], [place] [address] [phone]   9:53 AM

## 2018-10-20 NOTE — Discharge Instructions (Signed)

## 2018-10-20 NOTE — Progress Notes (Signed)
  Echocardiogram Echocardiogram Transesophageal has been performed.  Anna Mathews L Androw 10/20/2018, 10:02 AM

## 2018-10-20 NOTE — Anesthesia Preprocedure Evaluation (Addendum)
Anesthesia Evaluation  Patient identified by MRN, date of birth, ID band Patient awake    Reviewed: Allergy & Precautions, NPO status , Patient's Chart, lab work & pertinent test results  History of Anesthesia Complications Negative for: history of anesthetic complications  Airway Mallampati: II  TM Distance: >3 FB Neck ROM: Full    Dental  (+) Dental Advisory Given   Pulmonary former smoker (quit 1970),  10/17/2018 SARS coronavirus NEG   breath sounds clear to auscultation       Cardiovascular hypertension, Pt. on medications and Pt. on home beta blockers (-) angina+ dysrhythmias Atrial Fibrillation  Rhythm:Irregular Rate:Normal     Neuro/Psych negative neurological ROS     GI/Hepatic Neg liver ROS, GERD  Controlled,  Endo/Other  negative endocrine ROS  Renal/GU negative Renal ROS     Musculoskeletal  (+) Arthritis , Osteoarthritis,    Abdominal   Peds  Hematology eliquis   Anesthesia Other Findings   Reproductive/Obstetrics                            Anesthesia Physical Anesthesia Plan  ASA: III  Anesthesia Plan: General   Post-op Pain Management:    Induction:   PONV Risk Score and Plan: 3 and Treatment may vary due to age or medical condition and Ondansetron  Airway Management Planned: Natural Airway and Nasal Cannula  Additional Equipment:   Intra-op Plan:   Post-operative Plan:   Informed Consent: I have reviewed the patients History and Physical, chart, labs and discussed the procedure including the risks, benefits and alternatives for the proposed anesthesia with the patient or authorized representative who has indicated his/her understanding and acceptance.     Dental advisory given  Plan Discussed with: CRNA and Surgeon  Anesthesia Plan Comments:        Anesthesia Quick Evaluation

## 2018-10-21 ENCOUNTER — Encounter (HOSPITAL_COMMUNITY): Payer: Self-pay | Admitting: Cardiology

## 2018-11-27 ENCOUNTER — Other Ambulatory Visit: Payer: Self-pay | Admitting: *Deleted

## 2018-11-27 MED ORDER — APIXABAN 5 MG PO TABS: 5.0000 mg | ORAL_TABLET | Freq: Two times a day (BID) | ORAL | 5 refills | Status: DC

## 2018-11-27 NOTE — Telephone Encounter (Signed)
Prescription refill request for Eliquis received.  Last office visit: [place], 10-14-2018 Scr: 0.95, 10-14-2018 Age: 78 y.o. Weight: 70.8 kg  Prescription refill sent.

## 2018-12-16 ENCOUNTER — Ambulatory Visit: Payer: Medicare Other | Admitting: Cardiology

## 2019-01-09 ENCOUNTER — Other Ambulatory Visit: Payer: Self-pay | Admitting: Interventional Cardiology

## 2019-01-10 ENCOUNTER — Other Ambulatory Visit: Payer: Self-pay | Admitting: Cardiology

## 2019-01-12 ENCOUNTER — Telehealth: Payer: Self-pay | Admitting: Internal Medicine

## 2019-01-12 NOTE — Telephone Encounter (Signed)
Patient requests to be called at ph# [phone] to schedule appointment for Prolia injection at the beginning of January 2021 (after our office has placed the order for the Prolia and received it-patient is due for Prolia injection in very early January 2021)

## 2019-01-13 NOTE — Telephone Encounter (Signed)
Cannot verify benefits until January 2021 as this can change due to a renewal in coverage at the beginning of the year.  Will save note and submit then

## 2019-01-14 ENCOUNTER — Other Ambulatory Visit: Payer: Self-pay

## 2019-01-14 ENCOUNTER — Encounter: Payer: Self-pay | Admitting: Cardiology

## 2019-01-14 ENCOUNTER — Ambulatory Visit: Payer: Medicare Other | Admitting: Cardiology

## 2019-01-14 VITALS — BP 120/68 | HR 73 | Ht 64.0 in | Wt 150.4 lb

## 2019-01-14 DIAGNOSIS — I480 Paroxysmal atrial fibrillation: Secondary | ICD-10-CM

## 2019-01-14 NOTE — Patient Instructions (Signed)

## 2019-01-14 NOTE — Progress Notes (Signed)
Electrophysiology Office Note   Date:  01/14/2019   ID:  Anna Mathews, DOB September 25, 1940, MRN [id number]  PCP:  Vernie Shanks, MD  Cardiologist:  Casandra Doffing Primary Electrophysiologist:  Aizah Gehlhausen Meredith Leeds, MD    No chief complaint on file.    History of Present Illness: Anna Mathews is a 78 y.o. female who is being seen today for the evaluation of atrial fibrillation at the request of Ermalinda Barrios. Presenting today for electrophysiology evaluation.  She has a history of atrial fibrillation on flecainide, hypertension, hyperlipidemia.  She has not been feeling well for the last few months.  She had a colonoscopy about 6 weeks ago and she says that she went into atrial fibrillation at that time.  She has been more short of breath with activity such as going up and down stairs and walking her dog.  She also has quite a bit of fatigue.  She complains of palpitations when she lies on her left side. Was found to be in atrial flutter and had cardioversion 07/16/17.  She had a repeat cardioversion 10/20/2018.  Today, denies symptoms of palpitations, chest pain, shortness of breath, orthopnea, PND, lower extremity edema, claudication, dizziness, presyncope, syncope, bleeding, or neurologic sequela. The patient is tolerating medications without difficulties.  Overall she is doing well.  Since her cardioversion, she has noted no further episodes of atrial fibrillation.  Unfortunately, she has had a rough year.  Her husband died in her golden retriever also died.  She is moved from [place].  She does have family close by.   Past Medical History:  Diagnosis Date  . Atrial fibrillation ([place]) 09/2009   on coumadin  . Diverticulosis 09/2006  . GERD (gastroesophageal reflux disease)   . Hiatal hernia   . Hyperlipidemia   . Hypertension   . Osteopenia   . Postmenopausal hormone replacement therapy 1989 - 07/2000   Past Surgical History:  Procedure Laterality Date  . BREAST  DUCTAL SYSTEM EXCISION Left 06/2002   duct ectasia with fibrosis - atypical lobular hyperplasia with micro calcifications - tamoxifen X 28 months then change to Evista 10/06  . BREAST SURGERY Left 4/99   breast biopsy - fibrosis  . CARDIOVERSION N/A 07/16/2017   Procedure: CARDIOVERSION;  Surgeon: Jerline Pain, MD;  Location: [facility] ENDOSCOPY;  Service: Cardiovascular;  Laterality: N/A;  . CARDIOVERSION N/A 10/20/2018   Procedure: CARDIOVERSION;  Surgeon: Buford Dresser, MD;  Location: [facility] ENDOSCOPY;  Service: Cardiovascular;  Laterality: N/A;  . CESAREAN SECTION     X 2  . HYSTEROSCOPY  4/95   w/D&C  . TEE WITHOUT CARDIOVERSION N/A 10/20/2018   Procedure: TRANSESOPHAGEAL ECHOCARDIOGRAM (TEE);  Surgeon: Buford Dresser, MD;  Location[facility] ENDOSCOPY;  Service: Cardiovascular;  Laterality: N/A;     Current Outpatient Medications  Medication Sig Dispense Refill  . amLODipine (NORVASC) 5 MG tablet TAKE 1 TABLET BY MOUTH EVERY DAY 90 tablet 3  . apixaban (ELIQUIS) 5 MG TABS tablet Take 1 tablet (5 mg total) by mouth 2 (two) times daily. 60 tablet 5  . aspirin 81 MG chewable tablet Chew 81 mg by mouth daily.    . Biotin w/ Vitamins C & E (HAIR/SKIN/NAILS PO) Take 1 tablet by mouth daily.    . Calcium Carbonate-Vitamin D (CALCIUM 600+D PO) Take 1 tablet by mouth 2 (two) times daily.     . flecainide (TAMBOCOR) 150 MG tablet TAKE 1 TABLET BY MOUTH TWICE A DAY 180 tablet 2  .  Omega-3 Fatty Acids (OMEGA-3 PO) Take 1,200 mg by mouth 2 (two) times daily.     Marland Kitchen PROLIA 60 MG/ML SOSY injection INJECT 60MG  SUBCUTANEOUSLY  INTO UPPER ARM, THIGH, OR  ABDOMEN EVERY 6 MONTHS  (GIVEN AT PRESCRIBERS  OFFICE) 1 mL 1  . quinapril (ACCUPRIL) 40 MG tablet Take 40 mg by mouth daily.     . simvastatin (ZOCOR) 20 MG tablet Take 20 mg by mouth every evening.     . tretinoin (RETIN-A) 0.1 % cream Apply 1 application topically at bedtime.   2   No current facility-administered medications for this visit.     Allergies:   Penicillins, Crab [shellfish allergy], and Sulfa antibiotics   Social History:  The patient  reports that she quit smoking about 51 years ago. Her smoking use included cigarettes. She started smoking about 62 years ago. She has a 3.00 pack-year smoking history. She has never used smokeless tobacco. She reports current alcohol use. She reports that she does not use drugs.   Family History:  [name] family history includes Breast cancer in her maternal grandmother; COPD in her father; Diabetes in her maternal aunt, maternal aunt, maternal grandfather, and son; Heart disease in her father; Hypertension in her mother; Kidney cancer in her brother; Rheum arthritis in her brother.   ROS:  Please see the history of present illness.   Otherwise, review of systems is positive for none.   All other systems are reviewed and negative.   PHYSICAL EXAM: VS:  BP 120/68   Pulse 73   Ht 5\' 4"  (1.626 m)   Wt 150 lb 6.4 oz (68.2 kg)   LMP 01/23/1988 (Approximate)   SpO2 98%   BMI 25.82 kg/m  , BMI Body mass index is 25.82 kg/m. GEN: Well nourished, well developed, in no acute distress  HEENT: normal  Neck: no JVD, carotid bruits, or masses Cardiac: RRR; no murmurs, rubs, or gallops,no edema  Respiratory:  clear to auscultation bilaterally, normal work of breathing GI: soft, nontender, nondistended, + BS MS: no deformity or atrophy  Skin: warm and dry Neuro:  Strength and sensation are intact Psych: euthymic mood, full affect  EKG:  EKG is ordered today. Personal review of the ekg ordered shows sinus rhythm, rate 73  Recent Labs: 10/14/2018: BUN 26; Creatinine, Ser 0.95; Hemoglobin 14.5; Platelets 230; Potassium 5.4; Sodium 141    Lipid Panel  No results found for: CHOL, TRIG, HDL, CHOLHDL, VLDL, LDLCALC, LDLDIRECT   Wt Readings from Last 3 Encounters:  01/14/19 150 lb 6.4 oz (68.2 kg)  10/20/18 155 lb (70.3 kg)  10/14/18 156 lb (70.8 kg)      Other studies  Reviewed: Additional studies/ records that were reviewed today include: TTE 2014  Review of the above records today demonstrates:  LVEF 60 to 65%, mild MR, aortic valve is sclerotic but opens well, impaired relaxation consistent with age.    ASSESSMENT AND PLAN:  1.  Paroxysmal atrial fibrillation/flutter: Currently on flecainide, Eliquis, Toprol-XL.  CHA2DS2-VASc of 4.  She is remained in sinus rhythm.  No changes.  2.  Hypertension: Currently well controlled  3.  Hyperlipidemia: Continue Zocor  Current medicines are reviewed at length with the patient today.   The patient does not have concerns regarding her medicines.  The following changes were made today: None  Labs/ tests ordered today include:  Orders Placed This Encounter  Procedures  . EKG 12-Lead   Disposition:   FU with Cipriana Biller 6 months  Signed, Deshawna Mcneece Meredith Leeds, MD  01/14/2019 11:06 AM     [facility] [address] [place]  [address] [phone] (office) [phone] (fax)

## 2019-02-05 NOTE — Telephone Encounter (Signed)
Patient changed her insurance and wanted to know if she could get her varication for her prolia

## 2019-02-06 ENCOUNTER — Other Ambulatory Visit: Payer: Self-pay | Admitting: *Deleted

## 2019-02-06 ENCOUNTER — Telehealth: Payer: Self-pay | Admitting: Obstetrics and Gynecology

## 2019-02-06 NOTE — Telephone Encounter (Signed)
Patient want to know if her blood type is in chart.

## 2019-02-06 NOTE — Telephone Encounter (Signed)
Left message to call Sharee Pimple, RN at [facility].    Per review of Epic and Care Everywhere, no ABO/Rh on file.

## 2019-02-10 NOTE — Telephone Encounter (Signed)
Call to patient, no answer. Name identified on voicemail, left detailed message, ok per dpr. Advised no blood type on file. May request records from medical records, f/u with PCP or if you have ever donated blood to determine if blood type was ever done. Return call to office if any additional questions or assistance needed.   Routing to provider for final review. Patient is agreeable to disposition. Will close encounter.

## 2019-02-11 NOTE — Telephone Encounter (Signed)
Spoke to the patient and got her updated insurance information-I attempted to verify through [facility] portal but had to submit it verbally- I will receive Summary of Benefits in 3 to 5 business days and than I can contact to schedule nurse visit

## 2019-02-12 ENCOUNTER — Telehealth: Payer: Self-pay | Admitting: Obstetrics and Gynecology

## 2019-02-12 NOTE — Telephone Encounter (Signed)
Left message to call Sharee Pimple, RN at [facility].   See previous encounter dated 02/06/19.  No ABO/Rh in Epic or Care Everywhere.

## 2019-02-12 NOTE — Telephone Encounter (Signed)
Patient is asking to talk with Sharee Pimple. She does not understand the message that was left.

## 2019-02-17 ENCOUNTER — Ambulatory Visit: Payer: Medicare Other

## 2019-02-19 NOTE — Telephone Encounter (Signed)
Call placed to patient, no answer, left detailed message, ok per dpr. Advised no blood type on file. Return call to office if any additional questions.   Routing to provider for final review. Will close encounter.

## 2019-03-04 NOTE — Telephone Encounter (Signed)
Spoke with patient today and advised her that we are waiting for PA for [facility] to be completed so that we can get her scheduled

## 2019-03-10 NOTE — Telephone Encounter (Signed)
Received clarification from pt regarding insurance. PA form set on Dr. Quin Hoop desk for [employer]

## 2019-03-10 NOTE — Telephone Encounter (Signed)
Started the [facility] PA however seeing conflicting information now in chart regarding insurance. Need to have clarity of what her insurance carrier is?  LMTCB

## 2019-03-11 ENCOUNTER — Other Ambulatory Visit: Payer: Self-pay

## 2019-03-11 ENCOUNTER — Encounter (HOSPITAL_COMMUNITY): Payer: Self-pay

## 2019-03-11 ENCOUNTER — Ambulatory Visit (HOSPITAL_COMMUNITY)
Admission: EM | Admit: 2019-03-11 | Discharge: 2019-03-11 | Disposition: A | Discharge status: Home / Self Care | Payer: Medicare PPO | Attending: Urgent Care | Admitting: Urgent Care

## 2019-03-11 ENCOUNTER — Ambulatory Visit (INDEPENDENT_AMBULATORY_CARE_PROVIDER_SITE_OTHER): Payer: Medicare PPO

## 2019-03-11 DIAGNOSIS — S20412A Abrasion of left back wall of thorax, initial encounter: Secondary | ICD-10-CM

## 2019-03-11 DIAGNOSIS — M545 Low back pain, unspecified: Secondary | ICD-10-CM

## 2019-03-11 DIAGNOSIS — S20222A Contusion of left back wall of thorax, initial encounter: Secondary | ICD-10-CM

## 2019-03-11 DIAGNOSIS — W19XXXA Unspecified fall, initial encounter: Secondary | ICD-10-CM

## 2019-03-11 MED ORDER — METHOCARBAMOL 500 MG PO TABS: 500.0000 mg | ORAL_TABLET | Freq: Two times a day (BID) | ORAL | 0 refills | Status: DC

## 2019-03-11 NOTE — ED Provider Notes (Signed)
[place]   MRN: [id number] DOB: [birth year]  Subjective:   Anna Mathews is a 79 y.o. female presenting for suffering an accidental fall yesterday.  Patient states that she simply tripped while walking through her living room, made impact against a table and the door on her left back.  She has since had persistent achy pain and stiffness.  Denies feeling confusion, headache, chest pain, shortness of breath, heart racing prior to her fall.  She does have a history of chronic atrial fibrillation and is on medical therapy for this.  She has been using Tylenol to help with her symptoms but wants to make sure she did not suffer back fracture.  No current facility-administered medications for this encounter.  Current Outpatient Medications:  .  amLODipine (NORVASC) 5 MG tablet, TAKE 1 TABLET BY MOUTH EVERY DAY, Disp: 90 tablet, Rfl: 3 .  apixaban (ELIQUIS) 5 MG TABS tablet, Take 1 tablet (5 mg total) by mouth 2 (two) times daily., Disp: 60 tablet, Rfl: 5 .  aspirin 81 MG chewable tablet, Chew 81 mg by mouth daily., Disp: , Rfl:  .  Biotin w/ Vitamins C & E (HAIR/SKIN/NAILS PO), Take 1 tablet by mouth daily., Disp: , Rfl:  .  Calcium Carbonate-Vitamin D (CALCIUM 600+D PO), Take 1 tablet by mouth 2 (two) times daily. , Disp: , Rfl:  .  flecainide (TAMBOCOR) 150 MG tablet, TAKE 1 TABLET BY MOUTH TWICE A DAY, Disp: 180 tablet, Rfl: 2 .  Omega-3 Fatty Acids (OMEGA-3 PO), Take 1,200 mg by mouth 2 (two) times daily. , Disp: , Rfl:  .  PROLIA 60 MG/ML SOSY injection, INJECT 60MG  SUBCUTANEOUSLY  INTO UPPER ARM, THIGH, OR  ABDOMEN EVERY 6 MONTHS  (GIVEN AT PRESCRIBERS  OFFICE), Disp: 1 mL, Rfl: 1 .  quinapril (ACCUPRIL) 40 MG tablet, Take 40 mg by mouth daily. , Disp: , Rfl:  .  simvastatin (ZOCOR) 20 MG tablet, Take 20 mg by mouth every evening. , Disp: , Rfl:  .  tretinoin (RETIN-A) 0.1 % cream, Apply 1 application topically at bedtime. , Disp: , Rfl: 2   Allergies  Allergen Reactions  .  Penicillins Other (See Comments)    REACTION: "ITCHING IN EYES" Has patient had a PCN reaction causing immediate rash, facial/tongue/throat swelling, SOB or lightheadedness with hypotension: Unknown Has patient had a PCN reaction causing severe rash involving mucus membranes or skin necrosis: Unknown Has patient had a PCN reaction that required hospitalization: No Has patient had a PCN reaction occurring within the last 10 years: No If all of the above answers are "NO", then may proceed with Cephalosporin use.   Otho Darner Allergy] Itching    Full body itching  . Sulfa Antibiotics Other (See Comments)    REACTION: " NOT SURE"    Past Medical History:  Diagnosis Date  . Atrial fibrillation ([place]) 09/2009   on coumadin  . Diverticulosis 09/2006  . GERD (gastroesophageal reflux disease)   . Hiatal hernia   . Hyperlipidemia   . Hypertension   . Osteopenia   . Postmenopausal hormone replacement therapy 1989 - 07/2000     Past Surgical History:  Procedure Laterality Date  . 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 BREAST SURGERY Left 4/99   breast biopsy - fibrosis  . CARDIOVERSION N/A 07/16/2017   Procedure: CARDIOVERSION;  Surgeon:  Jerline Pain, MD;  Location: [facility] ENDOSCOPY;  Service: Cardiovascular;  Laterality: N/A;  . CARDIOVERSION N/A 10/20/2018   Procedure: CARDIOVERSION;  Surgeon: Buford Dresser, MD;  Location: [facility] ENDOSCOPY;  Service: Cardiovascular;  Laterality: N/A;  . CESAREAN SECTION     X 2  . HYSTEROSCOPY  4/95   w/D&C  . TEE WITHOUT CARDIOVERSION N/A 10/20/2018   Procedure: TRANSESOPHAGEAL ECHOCARDIOGRAM (TEE);  Surgeon: Buford Dresser, MD;  Location: [facility] ENDOSCOPY;  Service: Cardiovascular;  Laterality: N/A;    Family History  Problem Relation Age of Onset  . Hypertension Mother   . Heart disease Father    . COPD Father   . Diabetes Maternal Grandfather   . Diabetes Son   . Rheum arthritis Brother   . Kidney cancer Brother   . Breast cancer Maternal Grandmother   . Diabetes Maternal Aunt   . Diabetes Maternal Aunt     Social History   Tobacco Use  . Smoking status: Former Smoker    Packs/day: 0.25    Years: 12.00    Pack years: 3.00    Types: Cigarettes    Start date: 01/16/1957    Quit date: 01/23/1968    Years since quitting: 51.1  . Smokeless tobacco: Never Used  Substance Use Topics  . Alcohol use: Yes    Comment: social  . Drug use: No    ROS   Objective:   Vitals: BP 110/68 (BP Location: Left Arm)   Pulse 66   Temp 98.1 F (36.7 C) (Oral)   Resp 17   LMP 01/23/1988 (Approximate)   SpO2 98%   Physical Exam Constitutional:      General: She is not in acute distress.    Appearance: Normal appearance. She is well-developed. She is not ill-appearing, toxic-appearing or diaphoretic.  HENT:     Head: Normocephalic and atraumatic.     Nose: Nose normal.     Mouth/Throat:     Mouth: Mucous membranes are moist.  Eyes:     Extraocular Movements: Extraocular movements intact.     Pupils: Pupils are equal, round, and reactive to light.  Cardiovascular:     Rate and Rhythm: Normal rate and regular rhythm.     Pulses: Normal pulses.     Heart sounds: Normal heart sounds. No murmur. No friction rub. No gallop.   Pulmonary:     Effort: Pulmonary effort is normal. No respiratory distress.     Breath sounds: Normal breath sounds. No stridor. No wheezing, rhonchi or rales.  Musculoskeletal:       Back:  Skin:    General: Skin is warm and dry.     Findings: No rash.  Neurological:     Mental Status: She is alert and oriented to person, place, and time.     Cranial Nerves: No cranial nerve deficit.     Motor: No weakness.     Coordination: Coordination abnormal (Moving gingerly favoring her left back).     Deep Tendon Reflexes: Reflexes normal.  Psychiatric:         Mood and Affect: Mood normal.        Behavior: Behavior normal.        Thought Content: Thought content normal.        Judgment: Judgment normal.     DG Lumbar Spine Complete  Result Date: 03/11/2019 CLINICAL DATA:  Left-sided low back pain secondary to a fall yesterday. EXAM: LUMBAR SPINE - COMPLETE 4+ VIEW COMPARISON:  CT scan of the abdomen  and pelvis dated 04/20/2016 FINDINGS: There is no fracture or bone destruction. Minimal disc space narrowing at L5-S1. Slight facet arthritis in the lower lumbar spine. Aortic atherosclerosis. IMPRESSION: No acute abnormality. 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 Aortic Atherosclerosis (ICD10-I70.0). Electronically Signed   By: Lorriane Shire M.D.   On: 03/11/2019 13:46     Assessment and Plan :   1. Acute left-sided low back pain without sciatica   2. Accidental fall, initial encounter   3. Back contusion, left, initial encounter   4. Abrasion of left side of back, initial encounter    Patient is to start conservative management for back contusion and abrasion.  Physical exam findings and x-ray report reassuring.  Patient is to maintain Tylenol and add Robaxin. Counseled patient on potential for adverse effects with medications prescribed/recommended today, ER and return-to-clinic precautions discussed, patient verbalized understanding.    Jaynee Eagles, PA-C 03/11/19 1359

## 2019-03-11 NOTE — ED Triage Notes (Signed)
Pt reports yesterday she tripped in her living room, and fall over a table and then hit the door with her back.

## 2019-03-18 ENCOUNTER — Telehealth: Payer: Self-pay

## 2019-03-18 NOTE — Telephone Encounter (Signed)
LVM  making patient aware that we need to schedule a nurse visit for her Prolia-she owes $255 at check-in and is ready to be scheduled

## 2019-03-18 NOTE — Telephone Encounter (Signed)
Pt calling for update on prolia auth. I set the form on the desk not sure if she gave it to [place] or Lattie Haw please help because it did not come back to me

## 2019-03-18 NOTE — Telephone Encounter (Signed)
Paperwork was signed and faxed with fax confirmations received and placed in my folder and will be scanned into chart at the end of my normal 30 day hold.

## 2019-03-18 NOTE — Telephone Encounter (Signed)
[employer] approved prolia from 03/13/2019-01/22/2020 reference # [id number]

## 2019-03-18 NOTE — Telephone Encounter (Signed)
I have not seen this form Tashia have you seen this

## 2019-03-18 NOTE — Telephone Encounter (Signed)
Anna Mathews Please call pt back with follow up

## 2019-03-24 ENCOUNTER — Other Ambulatory Visit: Payer: Self-pay

## 2019-03-24 ENCOUNTER — Ambulatory Visit: Payer: Medicare PPO

## 2019-03-24 DIAGNOSIS — M8580 Other specified disorders of bone density and structure, unspecified site: Secondary | ICD-10-CM

## 2019-03-24 MED ORDER — DENOSUMAB 60 MG/ML ~~LOC~~ SOSY
60.0000 mg | PREFILLED_SYRINGE | Freq: Once | SUBCUTANEOUS | Status: AC
  Administered 2019-03-24: 60 mg via SUBCUTANEOUS

## 2019-03-24 NOTE — Progress Notes (Signed)
Per orders of Dr. Kelton Pillar injection of 60mg  prolia given in left arm today by Lolita Rieger RMA . Patient tolerated injection well.

## 2019-03-27 NOTE — Telephone Encounter (Signed)
Patient received Prolia on 03/24/19

## 2019-05-08 ENCOUNTER — Other Ambulatory Visit: Payer: Self-pay | Admitting: Cardiology

## 2019-05-08 ENCOUNTER — Other Ambulatory Visit: Payer: Self-pay | Admitting: Interventional Cardiology

## 2019-05-08 NOTE — Telephone Encounter (Signed)
Eliquis 5mg  refill request received. Patient is 79 years old, weight-68.2kg, Crea-0.95 on 10/14/2018, Diagnosis-Afib, and last seen by Dr. Curt Bears on 01/14/2019. Dose is appropriate based on dosing criteria. Will send in refill to requested pharmacy.

## 2019-06-25 ENCOUNTER — Telehealth: Payer: Self-pay | Admitting: Cardiology

## 2019-06-25 NOTE — Telephone Encounter (Signed)
Noted  

## 2019-06-25 NOTE — Telephone Encounter (Signed)
New message  Pt c/o medication issue:  1. Name of Medication: ELIQUIS 5 MG TABS tablet  2. How are you currently taking this medication (dosage and times per day)? As written  3. Are you having a reaction (difficulty breathing--STAT)? No   4. What is your medication issue?patient needs a new prescription for this medication sent to [facility], [address]

## 2019-06-25 NOTE — Telephone Encounter (Signed)
Anna Mathews is calling back stating she just looked at her bottle of medication again and she does not need a refill at this time. Please disregard refill request.

## 2019-07-16 NOTE — Progress Notes (Signed)
PCP:  Vernie Shanks, MD Primary Cardiologist: Larae Grooms, MD Electrophysiologist: Will Meredith Leeds, MD   Anna Mathews is a 79 y.o. female seen today for Will Meredith Leeds, MD for routine electrophysiology followup.  Since last being seen in our clinic the patient reports doing well overall. She has intermittent lightheadedness and frequent urination, she had extensive labwork drawn by her PCP Friday and is awaiting results. Otherwise she has no complaints. she denies chest pain, palpitations, dyspnea, PND, orthopnea, nausea, vomiting, syncope, edema, weight gain, or early satiety.  Past Medical History:  Diagnosis Date  . Atrial fibrillation ([place]) 09/2009   on coumadin  . Diverticulosis 09/2006  . GERD (gastroesophageal reflux disease)   . Hiatal hernia   . Hyperlipidemia   . Hypertension   . Osteopenia   . Postmenopausal hormone replacement therapy 1989 - 07/2000   Past Surgical History:  Procedure Laterality Date  . 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 BREAST SURGERY Left 4/99   breast biopsy - fibrosis  . CARDIOVERSION N/A 07/16/2017   Procedure: CARDIOVERSION;  Surgeon: Jerline Pain, MD;  Location: [facility] ENDOSCOPY;  Service: Cardiovascular;  Laterality: N/A;  . CARDIOVERSION N/A 10/20/2018   Procedure: CARDIOVERSION;  Surgeon: Buford Dresser, MD;  Location: [facility] ENDOSCOPY;  Service: Cardiovascular;  Laterality: N/A;  . CESAREAN SECTION     X 2  . HYSTEROSCOPY  4/95   w/D&C  . TEE WITHOUT CARDIOVERSION N/A 10/20/2018   Procedure: TRANSESOPHAGEAL ECHOCARDIOGRAM (TEE);  Surgeon: Buford Dresser, MD;  Location: [facility] ENDOSCOPY;  Service: Cardiovascular;  Laterality: N/A;    Current Outpatient Medications  Medication Sig Dispense Refill  . amLODipine (NORVASC) 5 MG tablet TAKE 1 TABLET BY MOUTH EVERY DAY 90 tablet  3  . aspirin 81 MG chewable tablet Chew 81 mg by mouth daily.    . Biotin w/ Vitamins C & E (HAIR/SKIN/NAILS PO) Take 1 tablet by mouth daily.    . Calcium Carbonate-Vitamin D (CALCIUM 600+D PO) Take 1 tablet by mouth 2 (two) times daily.     Marland Kitchen ELIQUIS 5 MG TABS tablet TAKE 1 TABLET BY MOUTH TWICE A DAY 60 tablet 5  . flecainide (TAMBOCOR) 150 MG tablet TAKE 1 TABLET BY MOUTH TWICE A DAY 180 tablet 2  . methocarbamol (ROBAXIN) 500 MG tablet Take 1 tablet (500 mg total) by mouth 2 (two) times daily. 30 tablet 0  . Omega-3 Fatty Acids (OMEGA-3 PO) Take 1,200 mg by mouth 2 (two) times daily.     Marland Kitchen PROLIA 60 MG/ML SOSY injection INJECT 60MG  SUBCUTANEOUSLY  INTO UPPER ARM, THIGH, OR  ABDOMEN EVERY 6 MONTHS  (GIVEN AT PRESCRIBERS  OFFICE) 1 mL 1  . quinapril (ACCUPRIL) 40 MG tablet Take 40 mg by mouth daily.     . simvastatin (ZOCOR) 20 MG tablet Take 20 mg by mouth every evening.     . tretinoin (RETIN-A) 0.1 % cream Apply 1 application topically at bedtime.   2   No current facility-administered medications for this visit.    Allergies  Allergen Reactions  . Penicillins Other (See Comments)    REACTION: "ITCHING IN EYES" Has patient had a PCN reaction causing immediate rash, facial/tongue/throat swelling, SOB or lightheadedness with hypotension: Unknown Has patient had a PCN reaction causing severe rash involving mucus membranes or skin necrosis: Unknown Has patient had a PCN  reaction that required hospitalization: No Has patient had a PCN reaction occurring within the last 10 years: No If all of the above answers are "NO", then may proceed with Cephalosporin use.   Otho Darner Allergy] Itching    Full body itching  . Sulfa Antibiotics Other (See Comments)    REACTION: " NOT SURE"    Social History   Socioeconomic History  . Marital status: Widowed    Spouse name: Not on file  . Number of children: 2  . Years of education: Not on file  . Highest education level: Not on file   Occupational History  . Not on file  Tobacco Use  . Smoking status: Former Smoker    Packs/day: 0.25    Years: 12.00    Pack years: 3.00    Types: Cigarettes    Start date: 01/16/1957    Quit date: 01/23/1968    Years since quitting: 51.5  . Smokeless tobacco: Never Used  Vaping Use  . Vaping Use: Never used  Substance and Sexual Activity  . Alcohol use: Yes    Comment: social  . Drug use: No  . Sexual activity: Not Currently    Partners: Male    Birth control/protection: Post-menopausal  Other Topics Concern  . Not on file  Social History Narrative  . Not on file   Social Determinants of Health   Financial Resource Strain:   . Difficulty of Paying Living Expenses:   Food Insecurity:   . Worried About Charity fundraiser in the Last Year:   . Arboriculturist in the Last Year:   Transportation Needs:   . Film/video editor (Medical):   Marland Kitchen Lack of Transportation (Non-Medical):   Physical Activity:   . Days of Exercise per Week:   . Minutes of Exercise per Session:   Stress:   . Feeling of Stress :   Social Connections:   . Frequency of Communication with Friends and Family:   . Frequency of Social Gatherings with Friends and Family:   . Attends Religious Services:   . Active Member of Clubs or Organizations:   . Attends Archivist Meetings:   Marland Kitchen Marital Status:   Intimate Partner Violence:   . Fear of Current or Ex-Partner:   . Emotionally Abused:   Marland Kitchen Physically Abused:   . Sexually Abused:      Review of Systems: General: No chills, fever, night sweats or weight changes  Cardiovascular:  No chest pain, dyspnea on exertion, edema, orthopnea, palpitations, paroxysmal nocturnal dyspnea Dermatological: No rash, lesions or masses Respiratory: No cough, dyspnea Urologic: No hematuria, dysuria Abdominal: No nausea, vomiting, diarrhea, bright red blood per rectum, melena, or hematemesis Neurologic: No visual changes, weakness, changes in mental  status All other systems reviewed and are otherwise negative except as noted above.  Physical Exam: Vitals:   07/20/19 0946  BP: 124/60  Pulse: 69  SpO2: 94%  Weight: 146 lb 9.6 oz (66.5 kg)  Height: 5\' 4"  (1.626 m)    GEN- The patient is well appearing, alert and oriented x 3 today.   HEENT: normocephalic, atraumatic; sclera clear, conjunctiva pink; hearing intact; oropharynx clear; neck supple, no JVP Lymph- no cervical lymphadenopathy Lungs- Clear to ausculation bilaterally, normal work of breathing.  No wheezes, rales, rhonchi Heart- Regular rate and rhythm, no murmurs, rubs or gallops, PMI not laterally displaced GI- soft, non-tender, non-distended, bowel sounds present, no hepatosplenomegaly Extremities- no clubbing, cyanosis, or edema; DP/PT/radial pulses  2+ bilaterally MS- no significant deformity or atrophy Skin- warm and dry, no rash or lesion Psych- euthymic mood, full affect Neuro- strength and sensation are intact  EKG is ordered. Personal review of EKG from today shows NSR at 69 bpm with 1st degree AV block at 224 ms and QRS of 126 ms (Stable from previous 01/14/2019)  Additional studies reviewed include: Previous EP office notes  Assessment and Plan:  1. Paroxysmal atrial fibrillation/flutter Continue flecainide and Toprol-XL. EKG stable Continue Eliquis for CHA2DS2VASC of at least 4    2. HTN Well controlled  3. HLD Continue zocor  4. High risk medication monitoring, Flecainide EKG today stable.   EKG stable. Will get recent labwork from PCP. RTC 6 months with Dr. Governor Specking, PA-C  07/20/19 9:58 AM

## 2019-07-18 ENCOUNTER — Other Ambulatory Visit: Payer: Self-pay | Admitting: Cardiology

## 2019-07-20 ENCOUNTER — Ambulatory Visit: Payer: Medicare PPO | Admitting: Student

## 2019-07-20 ENCOUNTER — Encounter: Payer: Self-pay | Admitting: Student

## 2019-07-20 ENCOUNTER — Other Ambulatory Visit: Payer: Self-pay

## 2019-07-20 VITALS — BP 124/60 | HR 69 | Ht 64.0 in | Wt 146.6 lb

## 2019-07-20 DIAGNOSIS — I10 Essential (primary) hypertension: Secondary | ICD-10-CM

## 2019-07-20 DIAGNOSIS — E782 Mixed hyperlipidemia: Secondary | ICD-10-CM

## 2019-07-20 DIAGNOSIS — I4891 Unspecified atrial fibrillation: Secondary | ICD-10-CM | POA: Diagnosis not present

## 2019-07-20 NOTE — Patient Instructions (Signed)
Medication Instructions:  *If you need a refill on your cardiac medications before your next appointment, please call your pharmacy*  Lab Work: If you have labs (blood work) drawn today and your tests are completely normal, you will receive your results only by: Marland Kitchen MyChart Message (if you have MyChart) OR . A paper copy in the mail If you have any lab test that is abnormal or we need to change your treatment, we will call you to review the results.  Testing/Procedures: None Ordered  Follow-Up: At [facility], you and your health needs are our priority.  As part of our continuing mission to provide you with exceptional heart care, we have created designated Provider Care Teams.  These Care Teams include your primary Cardiologist (physician) and Advanced Practice Providers (APPs -  Physician Assistants and Nurse Practitioners) who all work together to provide you with the care you need, when you need it.  We recommend signing up for the patient portal called "MyChart".  Sign up information is provided on this After Visit Summary.  MyChart is used to connect with patients for Virtual Visits (Telemedicine).  Patients are able to view lab/test results, encounter notes, upcoming appointments, etc.  Non-urgent messages can be sent to your provider as well.   To learn more about what you can do with MyChart, go to [email or web address].    Your next appointment:   Your physician wants you to follow-up in: 52 MONTHS with Dr. Curt Bears. You will receive a reminder letter in the mail two months in advance. If you don't receive a letter, please call our office to schedule the follow-up appointment.  The format for your next appointment:   In Person  Provider:   Allegra Lai, MD

## 2019-08-13 ENCOUNTER — Telehealth: Payer: Self-pay | Admitting: Obstetrics and Gynecology

## 2019-08-13 NOTE — Telephone Encounter (Signed)
Patient wants to come in to be seen asap. Patient states her bladder is falling out.

## 2019-08-13 NOTE — Telephone Encounter (Signed)
Spoke with patient. Patient is requesting an OV with Dr. Talbert Nan for "dropped bladder". Patient states she was seen by her PCP 2 wks ago for urinary frequency. States infection was ruled out, she was started on Vesicare (solifenancin) 5mg  PO daily at hs. Patient reports no change in symptoms. Denies dysuria, flank pain, dysuria, urgency. No bulging tissue from the vagina, no difficulty with BMs.   OV scheduled for 09/08/19 at 3:30pm with Dr. Talbert Nan. Patient is aware to call office if any new symptoms develop or symptoms worsen.   Routing to covering provider for final review. Patient is agreeable to disposition. Will close encounter.

## 2019-09-02 ENCOUNTER — Telehealth: Payer: Self-pay

## 2019-09-02 NOTE — Telephone Encounter (Signed)
Due to [facility] [place] Portal being down, patient was submitted for verification for Prolia over the phone with [employer] customer service rep named Raquel Sarna. Pt has been successfully submitted.

## 2019-09-08 ENCOUNTER — Encounter: Payer: Self-pay | Admitting: Obstetrics and Gynecology

## 2019-09-08 ENCOUNTER — Ambulatory Visit: Payer: Medicare PPO | Admitting: Obstetrics and Gynecology

## 2019-09-08 ENCOUNTER — Other Ambulatory Visit: Payer: Self-pay

## 2019-09-08 VITALS — BP 122/80 | HR 76 | Temp 97.7°F | Wt 145.4 lb

## 2019-09-08 DIAGNOSIS — N952 Postmenopausal atrophic vaginitis: Secondary | ICD-10-CM | POA: Diagnosis not present

## 2019-09-08 DIAGNOSIS — N3281 Overactive bladder: Secondary | ICD-10-CM | POA: Diagnosis not present

## 2019-09-08 NOTE — Progress Notes (Signed)
GYNECOLOGY  VISIT   HPI: 79 y.o.   Widowed White or Caucasian Not Hispanic or Latino  female   [phone] with Patient's last menstrual period was 01/23/1988 (approximate).   here for urinary frequency. Patient was seen at her PCP in June, 2021, urine dip was negative. She continues to have the frequency. Worried her bladder has prolapsed.  She feels like she is voiding frequently over the last few months. Voiding small to normal amounts. Normal stream. Some urgency to void, no dysuria. Not leaking urine with valsalva, can leak urine on the way to the bathroom, small or large amounts. Leaks every night, sometimes during the day. Voids 3 x a night, large amount. She doesn't drink after 9pm, goes to bed at 12 am. She is on vesicare 5 mg, minimal help. She denies a vaginal bulge. She doesn't drink caffeine or ETOH.  Last BMP in 9/20 creatinine 0.96, GFR mildly low at 57  GYNECOLOGIC HISTORY: Patient's last menstrual period was 01/23/1988 (approximate). Contraception:PMP Menopausal hormone therapy: none         OB History    Gravida  2   Para  2   Term  2   Preterm  0   AB  0   Living  2     SAB  0   TAB  0   Ectopic  0   Multiple  0   Live Births  2              Patient Active Problem List   Diagnosis Date Noted  . Atypical atrial flutter ([place])   . Chest pain 07/02/2017  . Hyperlipidemia 07/01/2017  . 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 Tear of lateral meniscus of knee 03/20/2017  . Tear of medial meniscus of knee 03/20/2017  . Arthritis of left knee 02/21/2017  . Pain in left knee 02/21/2017  . Anticoagulated 07/08/2015  . Long term current use of anticoagulant therapy 06/09/2013  . HTN (hypertension) 03/31/2013  . Unspecified vitamin D deficiency 03/31/2013  . 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 Osteopenia 03/31/2013  . Paroxysmal atrial fibrillation ([place]) 11/06/2012  . 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 Atrial fibrillation ([place]) 09/2009   on coumadin  . Diverticulosis 09/2006  . GERD (gastroesophageal reflux disease)   . Hiatal hernia   . Hyperlipidemia   . Hypertension   . Osteopenia   . Postmenopausal hormone replacement therapy 1989 - 07/2000    Past Surgical History:  Procedure Laterality Date  . 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 BREAST SURGERY Left 4/99   breast biopsy - fibrosis  . CARDIOVERSION N/A 07/16/2017   Procedure: CARDIOVERSION;  Surgeon: Jerline Pain, MD;  Location: [facility] ENDOSCOPY;  Service: Cardiovascular;  Laterality: N/A;  . CARDIOVERSION N/A 10/20/2018   Procedure: CARDIOVERSION;  Surgeon: Buford Dresser, MD;  Location: [facility] ENDOSCOPY;  Service: Cardiovascular;  Laterality: N/A;  . CESAREAN SECTION     X 2  . HYSTEROSCOPY  4/95   w/D&C  . TEE WITHOUT CARDIOVERSION N/A 10/20/2018   Procedure: TRANSESOPHAGEAL ECHOCARDIOGRAM (TEE);  Surgeon: Buford Dresser, MD;  Location: [facility] ENDOSCOPY;  Service: Cardiovascular;  Laterality: N/A;    Current Outpatient Medications  Medication Sig Dispense Refill  . amLODipine (NORVASC) 5 MG tablet  TAKE 1 TABLET BY MOUTH EVERY DAY 90 tablet 3  . aspirin 81 MG chewable tablet Chew 81 mg by mouth daily.    . Biotin w/ Vitamins C & E (HAIR/SKIN/NAILS PO) Take 1 tablet by mouth daily.    . Calcium Carbonate-Vitamin D (CALCIUM 600+D PO) Take 1 tablet by mouth 2 (two) times daily.     Marland Kitchen ELIQUIS 5 MG TABS tablet TAKE 1 TABLET BY MOUTH TWICE A DAY 60 tablet 5  . flecainide (TAMBOCOR) 150 MG tablet TAKE 1 TABLET BY MOUTH TWICE A DAY 180 tablet 3  . Omega-3 Fatty Acids (OMEGA-3 PO) Take 1,200 mg by mouth 2 (two) times daily.     Marland Kitchen PROLIA 60 MG/ML SOSY injection INJECT 60MG  SUBCUTANEOUSLY  INTO UPPER ARM, THIGH, OR  ABDOMEN EVERY 6 MONTHS   (GIVEN AT PRESCRIBERS  OFFICE) 1 mL 1  . quinapril (ACCUPRIL) 40 MG tablet Take 40 mg by mouth daily.     . simvastatin (ZOCOR) 20 MG tablet Take 20 mg by mouth every evening.     . solifenacin (VESICARE) 5 MG tablet Take 5 mg by mouth at bedtime.    . tretinoin (RETIN-A) 0.1 % cream Apply 1 application topically at bedtime.   2  . methocarbamol (ROBAXIN) 500 MG tablet Take 1 tablet (500 mg total) by mouth 2 (two) times daily. (Patient not taking: Reported on 09/08/2019) 30 tablet 0   No current facility-administered medications for this visit.     ALLERGIES: Penicillins, Crab [shellfish allergy], and Sulfa antibiotics  Family History  Problem Relation Age of Onset  . Hypertension Mother   . Heart disease Father   . COPD Father   . Diabetes Maternal Grandfather   . Diabetes Son   . Rheum arthritis Brother   . Kidney cancer Brother   . Breast cancer Maternal Grandmother   . Diabetes Maternal Aunt   . Diabetes Maternal Aunt     Social History   Socioeconomic History  . Marital status: Widowed    Spouse name: Not on file  . Number of children: 2  . Years of education: Not on file  . Highest education level: Not on file  Occupational History  . Not on file  Tobacco Use  . Smoking status: Former Smoker    Packs/day: 0.25    Years: 12.00    Pack years: 3.00    Types: Cigarettes    Start date: 01/16/1957    Quit date: 01/23/1968    Years since quitting: 51.6  . Smokeless tobacco: Never Used  Vaping Use  . Vaping Use: Never used  Substance and Sexual Activity  . Alcohol use: Yes    Comment: social  . Drug use: No  . Sexual activity: Not Currently    Partners: Male    Birth control/protection: Post-menopausal  Other Topics Concern  . Not on file  Social History Narrative  . Not on file   Social Determinants of Health   Financial Resource Strain:   . Difficulty of Paying Living Expenses:   Food Insecurity:   . Worried About Charity fundraiser in the Last Year:    . Arboriculturist in the Last Year:   Transportation Needs:   . Film/video editor (Medical):   Marland Kitchen Lack of Transportation (Non-Medical):   Physical Activity:   . Days of Exercise per Week:   . Minutes of Exercise per Session:   Stress:   . Feeling of Stress :   Social  Connections:   . Frequency of Communication with Friends and Family:   . Frequency of Social Gatherings with Friends and Family:   . Attends Religious Services:   . Active Member of Clubs or Organizations:   . Attends Archivist Meetings:   Marland Kitchen Marital Status:   Intimate Partner Violence:   . Fear of Current or Ex-Partner:   . Emotionally Abused:   Marland Kitchen Physically Abused:   . Sexually Abused:     Review of Systems  Constitutional: Negative.   HENT: Negative.   Eyes: Negative.   Respiratory: Negative.   Gastrointestinal: Negative.   Genitourinary: Positive for frequency and urgency.       Bladder prolapse  Musculoskeletal: Negative.   Skin: Negative.   Neurological: Negative.   Endo/Heme/Allergies: Negative.   Psychiatric/Behavioral: Negative.     PHYSICAL EXAMINATION:    BP 122/80 (BP Location: Right Arm, Patient Position: Sitting, Cuff Size: Normal)   Pulse 76   Temp 97.7 F (36.5 C) (Skin)   Wt 145 lb 6.4 oz (66 kg)   LMP 01/23/1988 (Approximate)   BMI 24.96 kg/m     General appearance: alert, cooperative and appears stated age CVA: not tender Abdomen: soft, non-tender; non distended, no masses,  no organomegaly  Pelvic: External genitalia:  Large blackhead on right vulva, expressed.               Urethra:  normal appearing urethra with no masses, tenderness or lesions              Bartholins and Skenes: normal                 Vagina: very atrophic appearing vagina with normal color and discharge, no lesions. No prolapse.               Cervix: flush with vagina              Bimanual Exam:  Uterus:  no masses or tenderness              Adnexa: no mass, fullness, tenderness                 Chaperone was present for exam.  ASSESSMENT Overactive bladder Vaginal atrophy No signs of prolapse    PLAN Send urine for ua, c&s Discussed option of trying PT or increasing her vesicare. She already has some constipation Will refer to PT and f/u in 6 weeks Bladder training handout given   An After Visit Summary was printed and given to the patient.  ~24 minutes in total patient care.

## 2019-09-08 NOTE — Patient Instructions (Signed)

## 2019-09-09 ENCOUNTER — Telehealth: Payer: Self-pay | Admitting: Obstetrics and Gynecology

## 2019-09-09 LAB — URINALYSIS, MICROSCOPIC ONLY
Bacteria, UA: NONE SEEN
Casts: NONE SEEN /lpf

## 2019-09-09 NOTE — Telephone Encounter (Signed)
Patient was seen yesterday and needs a 6 week follow up appointment with Dr.Jertson. To triage to assist with scheduling.

## 2019-09-09 NOTE — Telephone Encounter (Signed)
Pt was called and informed that summary of benefits still has not come back as of this time, and she does not need to contact to schedule. I explained the process that we must go through, and after explaining, the patient understood. Pt was made aware that once the information is gathered and PA completed, if needed, we will contact her to schedule.

## 2019-09-09 NOTE — Telephone Encounter (Signed)
Patient requests to be called at ph# [phone] re: patient states she needs to schedule Prolia injection and that her insurance has changed to [facility].

## 2019-09-09 NOTE — Telephone Encounter (Signed)
Spoke with patient.  6 wk f/u OV scheduled for 10/20/19 at 4pm with Dr. Talbert Nan.  Patient is agreeable to date and time.  Encounter closed.

## 2019-09-10 LAB — URINE CULTURE

## 2019-09-10 NOTE — Telephone Encounter (Signed)
Summary of benefits indicates that the patient does have coverage available for Prolia. It does require a PA, but a PA is on file and it is good from 03/13/2019 though 01/22/2020. Patient owes $40 for Prolia and administration, and she can be scheduled anytime after 09/24/19.  Attempted to contact pt and she did not answer. Left detailed voicemail with the above information. Requested a call back to get pt scheduled.

## 2019-09-24 DIAGNOSIS — Z7901 Long term (current) use of anticoagulants: Secondary | ICD-10-CM | POA: Diagnosis not present

## 2019-09-24 DIAGNOSIS — R358 Other polyuria: Secondary | ICD-10-CM | POA: Diagnosis not present

## 2019-09-24 DIAGNOSIS — I480 Paroxysmal atrial fibrillation: Secondary | ICD-10-CM | POA: Diagnosis not present

## 2019-09-24 DIAGNOSIS — I10 Essential (primary) hypertension: Secondary | ICD-10-CM | POA: Diagnosis not present

## 2019-09-24 DIAGNOSIS — R7303 Prediabetes: Secondary | ICD-10-CM | POA: Diagnosis not present

## 2019-09-24 DIAGNOSIS — E782 Mixed hyperlipidemia: Secondary | ICD-10-CM | POA: Diagnosis not present

## 2019-09-24 DIAGNOSIS — R7989 Other specified abnormal findings of blood chemistry: Secondary | ICD-10-CM | POA: Diagnosis not present

## 2019-09-24 DIAGNOSIS — M8580 Other specified disorders of bone density and structure, unspecified site: Secondary | ICD-10-CM | POA: Diagnosis not present

## 2019-10-01 ENCOUNTER — Ambulatory Visit: Payer: Medicare PPO

## 2019-10-01 ENCOUNTER — Other Ambulatory Visit: Payer: Self-pay

## 2019-10-01 DIAGNOSIS — M8580 Other specified disorders of bone density and structure, unspecified site: Secondary | ICD-10-CM | POA: Diagnosis not present

## 2019-10-01 MED ORDER — DENOSUMAB 60 MG/ML ~~LOC~~ SOSY
60.0000 mg | PREFILLED_SYRINGE | Freq: Once | SUBCUTANEOUS | Status: AC
  Administered 2019-10-01: 60 mg via SUBCUTANEOUS

## 2019-10-01 NOTE — Progress Notes (Signed)
Per orders of Dr. Kelton Pillar injection of Prolia 60mg  given today by N.Roshawnda Pecora,LPN. Patient tolerated injection well.

## 2019-10-12 NOTE — Progress Notes (Signed)
Cardiology Office Note   Date:  10/13/2019   ID:  Anna Mathews, DOB 1940-12-09, MRN [id number]  PCP:  Vernie Shanks, MD    No chief complaint on file.  AFib  Wt Readings from Last 3 Encounters:  10/13/19 141 lb 6.4 oz (64.1 kg)  09/08/19 145 lb 6.4 oz (66 kg)  07/20/19 146 lb 9.6 oz (66.5 kg)       History of Present Illness: Anna Mathews is a 79 y.o. female  Who has AFib, maintaining NSR on flecainide. We tried a lower dose of flecainide in the past but she had SHOB, so resumed the current dose.   Her husband died in 07-07-2015. She has adjusted well. Shemoved and enjoys her downsized lifestyle.  She had a fall in 5/18 and hit her face and shoulder. Head CT was negative. She has a dislocated right shoulder. It is being managed conservatively. She did PT.  In June 2019, she went out of rhythm and had a cardioversion.    She saw Dr. Curt Bears in 2019 and medical therapy was continued.  She has followed with EP since then.  Since the last visit, she has had a lot of stress with her son's illness.  He has an autoimmune illness related to a flu shot 8 years ago.  He is now on dialysis, had an amputation.  He worked as a Marine scientist, and has 3 kids.    Never got COVID.  She did get her vaccines.   Denies : Chest pain. Dizziness. Leg edema. Nitroglycerin use. Orthopnea. Palpitations. Paroxysmal nocturnal dyspnea. Shortness of breath. Syncope.    She walks and does yoga.     Past Medical History:  Diagnosis Date  . Atrial fibrillation ([place]) 09/2009   on coumadin  . Diverticulosis 09/2006  . GERD (gastroesophageal reflux disease)   . Hiatal hernia   . Hyperlipidemia   . Hypertension   . Osteopenia   . Postmenopausal hormone replacement therapy 1989 - 07/2000    Past Surgical History:  Procedure Laterality Date  . BREAST DUCTAL SYSTEM EXCISION Left 06/2002   duct ectasia with fibrosis - atypical lobular hyperplasia with micro calcifications - tamoxifen X 28  months then change to Evista 10/06  . BREAST SURGERY Left 4/99   breast biopsy - fibrosis  . CARDIOVERSION N/A 07/16/2017   Procedure: CARDIOVERSION;  Surgeon: Jerline Pain, MD;  Location: [facility] ENDOSCOPY;  Service: Cardiovascular;  Laterality: N/A;  . CARDIOVERSION N/A 10/20/2018   Procedure: CARDIOVERSION;  Surgeon: Buford Dresser, MD;  Location: [facility] ENDOSCOPY;  Service: Cardiovascular;  Laterality: N/A;  . CESAREAN SECTION     X 2  . HYSTEROSCOPY  4/95   w/D&C  . TEE WITHOUT CARDIOVERSION N/A 10/20/2018   Procedure: TRANSESOPHAGEAL ECHOCARDIOGRAM (TEE);  Surgeon: Buford Dresser, MD;  Location: [facility] ENDOSCOPY;  Service: Cardiovascular;  Laterality: N/A;     Current Outpatient Medications  Medication Sig Dispense Refill  . amLODipine (NORVASC) 5 MG tablet TAKE 1 TABLET BY MOUTH EVERY DAY 90 tablet 3  . aspirin 81 MG chewable tablet Chew 81 mg by mouth daily.    . Biotin w/ Vitamins C & E (HAIR/SKIN/NAILS PO) Take 1 tablet by mouth daily.    . Calcium Carbonate-Vitamin D (CALCIUM 600+D PO) Take 1 tablet by mouth 2 (two) times daily.     Marland Kitchen ELIQUIS 5 MG TABS tablet TAKE 1 TABLET BY MOUTH TWICE A DAY 60 tablet 5  . flecainide (TAMBOCOR) 150 MG tablet  TAKE 1 TABLET BY MOUTH TWICE A DAY 180 tablet 3  . methocarbamol (ROBAXIN) 500 MG tablet Take 1 tablet (500 mg total) by mouth 2 (two) times daily. 30 tablet 0  . Omega-3 Fatty Acids (OMEGA-3 PO) Take 1,200 mg by mouth 2 (two) times daily.     Marland Kitchen PROLIA 60 MG/ML SOSY injection INJECT 60MG  SUBCUTANEOUSLY  INTO UPPER ARM, THIGH, OR  ABDOMEN EVERY 6 MONTHS  (GIVEN AT PRESCRIBERS  OFFICE) 1 mL 1  . quinapril (ACCUPRIL) 40 MG tablet Take 40 mg by mouth daily.     . simvastatin (ZOCOR) 20 MG tablet Take 20 mg by mouth every evening.     . solifenacin (VESICARE) 5 MG tablet Take 5 mg by mouth at bedtime.    . tretinoin (RETIN-A) 0.1 % cream Apply 1 application topically at bedtime.   2   No current facility-administered medications for this  visit.    Allergies:   Penicillins, Crab [shellfish allergy], and Sulfa antibiotics    Social History:  The patient  reports that she quit smoking about 51 years ago. Her smoking use included cigarettes. She started smoking about 62 years ago. She has a 3.00 pack-year smoking history. She has never used smokeless tobacco. She reports current alcohol use. She reports that she does not use drugs.   Family History:  [name] family history includes Breast cancer in her maternal grandmother; COPD in her father; Diabetes in her maternal aunt, maternal aunt, maternal grandfather, and son; Heart disease in her father; Hypertension in her mother; Kidney cancer in her brother; Rheum arthritis in her brother.    ROS:  Please see the history of present illness.   Otherwise, review of systems are positive for increased stress with her son's illness.   All other systems are reviewed and negative.    PHYSICAL EXAM: VS:  BP (!) 110/54   Pulse 75   Ht 5\' 4"  (1.626 m)   Wt 141 lb 6.4 oz (64.1 kg)   LMP 01/23/1988 (Approximate)   SpO2 96%   BMI 24.27 kg/m  , BMI Body mass index is 24.27 kg/m. GEN: Well nourished, well developed, in no acute distress  HEENT: normal  Neck: no JVD, carotid bruits, or masses Cardiac: RRR; no murmurs, rubs, or gallops,no edema  Respiratory:  clear to auscultation bilaterally, normal work of breathing GI: soft, nontender, nondistended, + BS MS: no deformity or atrophy  Skin: warm and dry, no rash Neuro:  Strength and sensation are intact Psych: euthymic mood, full affect   EKG:   The ekg ordered in June 2021 demonstrates NSR, IVCD   Recent Labs: 10/14/2018: BUN 26; Creatinine, Ser 0.95; Hemoglobin 14.5; Platelets 230; Potassium 5.4; Sodium 141   Lipid Panel No results found for: CHOL, TRIG, HDL, CHOLHDL, VLDL, LDLCALC, LDLDIRECT   Other studies Reviewed: Additional studies/ records that were reviewed today with results demonstrating: labs reviewed.      ASSESSMENT AND PLAN:  1. PAF: No sx of AFib. Rhythm controlled with flecainide.  No bleeding issues with ELiquis.  2. HTN: The current medical regimen is effective;  continue present plan and medications. 3. Hyperlipidemia: The current medical regimen is effective;  continue present plan and medications. 4. 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  Whole food plant based diet recommended to reduce blood sugar as well.    Current medicines are reviewed at length with the patient today.  The patient concerns regarding her medicines were addressed.  The following changes have been made:  No change  Labs/ tests ordered today include:  No orders of the defined types were placed in this encounter.   Recommend 150 minutes/week of aerobic exercise Low fat, low carb, high fiber diet recommended  Disposition:   FU in 1 year   Signed, Larae Grooms, MD  10/13/2019 9:48 AM    [facility] [address], [place], [place]  [address] Phone: [phone]; Fax: [phone]

## 2019-10-13 ENCOUNTER — Ambulatory Visit: Payer: Medicare PPO | Admitting: Interventional Cardiology

## 2019-10-13 ENCOUNTER — Other Ambulatory Visit: Payer: Self-pay

## 2019-10-13 ENCOUNTER — Encounter: Payer: Self-pay | Admitting: Interventional Cardiology

## 2019-10-13 VITALS — BP 110/54 | HR 75 | Ht 64.0 in | Wt 141.4 lb

## 2019-10-13 DIAGNOSIS — I4891 Unspecified atrial fibrillation: Secondary | ICD-10-CM

## 2019-10-13 DIAGNOSIS — R7303 Prediabetes: Secondary | ICD-10-CM

## 2019-10-13 DIAGNOSIS — E782 Mixed hyperlipidemia: Secondary | ICD-10-CM

## 2019-10-13 DIAGNOSIS — I10 Essential (primary) hypertension: Secondary | ICD-10-CM | POA: Diagnosis not present

## 2019-10-13 NOTE — Patient Instructions (Signed)
Medication Instructions:  Your physician recommends that you continue on your current medications as directed. Please refer to the Current Medication list given to you today.  *If you need a refill on your cardiac medications before your next appointment, please call your pharmacy*   Lab Work: None  If you have labs (blood work) drawn today and your tests are completely normal, you will receive your results only by: Marland Kitchen MyChart Message (if you have MyChart) OR . A paper copy in the mail If you have any lab test that is abnormal or we need to change your treatment, we will call you to review the results.   Testing/Procedures: None   Follow-Up: At [facility], you and your health needs are our priority.  As part of our continuing mission to provide you with exceptional heart care, we have created designated Provider Care Teams.  These Care Teams include your primary Cardiologist (physician) and Advanced Practice Providers (APPs -  Physician Assistants and Nurse Practitioners) who all work together to provide you with the care you need, when you need it.  We recommend signing up for the patient portal called "MyChart".  Sign up information is provided on this After Visit Summary.  MyChart is used to connect with patients for Virtual Visits (Telemedicine).  Patients are able to view lab/test results, encounter notes, upcoming appointments, etc.  Non-urgent messages can be sent to your provider as well.   To learn more about what you can do with MyChart, go to [email or web address].    Your next appointment:   12 month(s)  The format for your next appointment:   In Person  Provider:   You may see Larae Grooms, MD or one of the following Advanced Practice Providers on your designated Care Team:    Melina Copa, PA-C  Ermalinda Barrios, PA-C    Other Instructions  Heart-Healthy Eating Plan Heart-healthy meal planning includes:  Eating less unhealthy fats.  Eating more  healthy fats.  Making other changes in your diet. Talk with your doctor or a diet specialist (dietitian) to create an eating plan that is right for you. What is my plan? Your doctor may recommend an eating plan that includes:  Total fat: ______% or less of total calories a day.  Saturated fat: ______% or less of total calories a day.  Cholesterol: less than _________mg a day. What are tips for following this plan? Cooking Avoid frying your food. Try to bake, boil, grill, or broil it instead. You can also reduce fat by:  Removing the skin from poultry.  Removing all visible fats from meats.  Steaming vegetables in water or broth. Meal planning   At meals, divide your plate into four equal parts: ? Fill one-half of your plate with vegetables and green salads. ? Fill one-fourth of your plate with whole grains. ? Fill one-fourth of your plate with lean protein foods.  Eat 4-5 servings of vegetables per day. A serving of vegetables is: ? 1 cup of raw or cooked vegetables. ? 2 cups of raw leafy greens.  Eat 4-5 servings of fruit per day. A serving of fruit is: ? 1 medium whole fruit. ?  cup of dried fruit. ?  cup of fresh, frozen, or canned fruit. ?  cup of 100% fruit juice.  Eat more foods that have soluble fiber. These are apples, broccoli, carrots, beans, peas, and barley. Try to get 20-30 g of fiber per day.  Eat 4-5 servings of nuts, legumes, and seeds per week: ?  1 serving of dried beans or legumes equals  cup after being cooked. ? 1 serving of nuts is  cup. ? 1 serving of seeds equals 1 tablespoon. General information  Eat more home-cooked food. Eat less restaurant, buffet, and fast food.  Limit or avoid alcohol.  Limit foods that are high in starch and sugar.  Avoid fried foods.  Lose weight if you are overweight.  Keep track of how much salt (sodium) you eat. This is important if you have high blood pressure. Ask your doctor to tell you more about  this.  Try to add vegetarian meals each week. Fats  Choose healthy fats. These include olive oil and canola oil, flaxseeds, walnuts, almonds, and seeds.  Eat more omega-3 fats. These include salmon, mackerel, sardines, tuna, flaxseed oil, and ground flaxseeds. Try to eat fish at least 2 times each week.  Check food labels. Avoid foods with trans fats or high amounts of saturated fat.  Limit saturated fats. ? These are often found in animal products, such as meats, butter, and cream. ? These are also found in plant foods, such as palm oil, palm kernel oil, and coconut oil.  Avoid foods with partially hydrogenated oils in them. These have trans fats. Examples are stick margarine, some tub margarines, cookies, crackers, and other baked goods. What foods can I eat? Fruits All fresh, canned (in natural juice), or frozen fruits. Vegetables Fresh or frozen vegetables (raw, steamed, roasted, or grilled). Green salads. Grains Most grains. Choose whole wheat and whole grains most of the time. Rice and pasta, including brown rice and pastas made with whole wheat. Meats and other proteins Lean, well-trimmed beef, veal, pork, and lamb. Chicken and [place] without skin. All fish and shellfish. Wild duck, rabbit, pheasant, and venison. Egg whites or low-cholesterol egg substitutes. Dried beans, peas, lentils, and tofu. Seeds and most nuts. Dairy Low-fat or nonfat cheeses, including ricotta and mozzarella. Skim or 1% milk that is liquid, powdered, or evaporated. Buttermilk that is made with low-fat milk. Nonfat or low-fat yogurt. Fats and oils Non-hydrogenated (trans-free) margarines. Vegetable oils, including soybean, sesame, sunflower, olive, peanut, safflower, corn, canola, and cottonseed. Salad dressings or mayonnaise made with a vegetable oil. Beverages Mineral water. Coffee and tea. Diet carbonated beverages. Sweets and desserts Sherbet, gelatin, and fruit ice. Small amounts of dark  chocolate. Limit all sweets and desserts. Seasonings and condiments All seasonings and condiments. The items listed above may not be a complete list of foods and drinks you can eat. Contact a dietitian for more options. What foods should I avoid? Fruits Canned fruit in heavy syrup. Fruit in cream or butter sauce. Fried fruit. Limit coconut. Vegetables Vegetables cooked in cheese, cream, or butter sauce. Fried vegetables. Grains Breads that are made with saturated or trans fats, oils, or whole milk. Croissants. Sweet rolls. Donuts. High-fat crackers, such as cheese crackers. Meats and other proteins Fatty meats, such as hot dogs, ribs, sausage, bacon, rib-eye roast or steak. High-fat deli meats, such as salami and bologna. Caviar. Domestic duck and goose. Organ meats, such as liver. Dairy Cream, sour cream, cream cheese, and creamed cottage cheese. Whole-milk cheeses. Whole or 2% milk that is liquid, evaporated, or condensed. Whole buttermilk. Cream sauce or high-fat cheese sauce. Yogurt that is made from whole milk. Fats and oils Meat fat, or shortening. Cocoa butter, hydrogenated oils, palm oil, coconut oil, palm kernel oil. Solid fats and shortenings, including bacon fat, salt pork, lard, and butter. Nondairy cream substitutes. Salad dressings with  cheese or sour cream. Beverages Regular sodas and juice drinks with added sugar. Sweets and desserts Frosting. Pudding. Cookies. Cakes. Pies. Milk chocolate or white chocolate. Buttered syrups. Full-fat ice cream or ice cream drinks. The items listed above may not be a complete list of foods and drinks to avoid. Contact a dietitian for more information. Summary  Heart-healthy meal planning includes eating less unhealthy fats, eating more healthy fats, and making other changes in your diet.  Eat a balanced diet. This includes fruits and vegetables, low-fat or nonfat dairy, lean protein, nuts and legumes, whole grains, and heart-healthy oils and  fats. This information is not intended to replace advice given to you by your health care provider. Make sure you discuss any questions you have with your health care provider. Document Revised: 03/14/2017 Document Reviewed: 02/15/2017 Elsevier Patient Education  2020 [employer].

## 2019-10-20 ENCOUNTER — Ambulatory Visit: Payer: Self-pay | Admitting: Obstetrics and Gynecology

## 2019-10-20 ENCOUNTER — Telehealth: Payer: Self-pay | Admitting: Obstetrics & Gynecology

## 2019-10-20 NOTE — Telephone Encounter (Signed)
Patient cancelled her appointment for 6wk follow up for today. Patient states her son passed away on last 04-27-22 unexpectedly. She will call back to reschedule.Marland Kitchen

## 2019-11-06 ENCOUNTER — Telehealth: Payer: Self-pay

## 2019-11-06 NOTE — Telephone Encounter (Signed)
Left message for pt to return call to triage RN. 

## 2019-11-06 NOTE — Telephone Encounter (Signed)
Patient is calling to have follow up rescheduled from (10/20/19). No available appoints need triage to assist.

## 2019-11-09 NOTE — Telephone Encounter (Signed)
Spoke with pt. Pt states had cancelled 6 week f/u from 9/28 with Dr Talbert Nan due to lost her phone. Pt now wants to schedule. Pt scheduled with Dr Talbert Nan on 10/19 at 130 pm. Pt agreeable to date and time of appt.  Encounter closed.   Per reviewed OV notes:  ASSESSMENT Overactive bladder Vaginal atrophy No signs of prolapse PLAN Send urine for ua, c&s Discussed option of trying PT or increasing her vesicare. She already has some constipation Will refer to PT and f/u in 6 weeks Bladder training handout given

## 2019-11-09 NOTE — Telephone Encounter (Signed)
Patient is returning call.  °

## 2019-11-10 ENCOUNTER — Ambulatory Visit: Payer: Medicare PPO | Admitting: Obstetrics and Gynecology

## 2019-11-10 ENCOUNTER — Other Ambulatory Visit: Payer: Self-pay

## 2019-11-10 ENCOUNTER — Encounter: Payer: Self-pay | Admitting: Obstetrics and Gynecology

## 2019-11-10 VITALS — BP 128/74 | HR 76 | Resp 12 | Ht 66.0 in | Wt 153.0 lb

## 2019-11-10 DIAGNOSIS — N3281 Overactive bladder: Secondary | ICD-10-CM | POA: Diagnosis not present

## 2019-11-10 DIAGNOSIS — F4321 Adjustment disorder with depressed mood: Secondary | ICD-10-CM | POA: Diagnosis not present

## 2019-11-10 NOTE — Patient Instructions (Addendum)
Managing Loss, Adult People experience loss in many different ways throughout their lives. Events such as moving, changing jobs, and losing friends can create a sense of loss. The loss may be as serious as a major health change, divorce, death of a pet, or death of a loved one. All of these types of loss are likely to create a physical and emotional reaction known as grief. Grief is the result of a major change or an absence of something or someone that you count on. Grief is a normal reaction to loss. A variety of factors can affect your grieving experience, including:  The nature of your loss.  Your relationship to what or whom you lost.  Your understanding of grief and how to manage it.  Your support system. How to manage lifestyle changes Keep to your normal routine as much as possible.  If you have trouble focusing or doing normal activities, it is acceptable to take some time away from your normal routine.  Spend time with friends and loved ones.  Eat a healthy diet, get plenty of sleep, and rest when you feel tired. How to recognize changes  The way that you deal with your grief will affect your ability to function as you normally do. When grieving, you may experience these changes:  Numbness, shock, sadness, anxiety, anger, denial, and guilt.  Thoughts about death.  Unexpected crying.  A physical sensation of emptiness in your stomach.  Problems sleeping and eating.  Tiredness (fatigue).  Loss of interest in normal activities.  Dreaming about or imagining seeing the person who died.  A need to remember what or whom you lost.  Difficulty thinking about anything other than your loss for a period of time.  Relief. If you have been expecting the loss for a while, you may feel a sense of relief when it happens. Follow these instructions at home:  Activity Express your feelings in healthy ways, such as:  Talking with others about your loss. It may be helpful to find  others who have had a similar loss, such as a support group.  Writing down your feelings in a journal.  Doing physical activities to release stress and emotional energy.  Doing creative activities like painting, sculpting, or playing or listening to music.  Practicing resilience. This is the ability to recover and adjust after facing challenges. Reading some resources that encourage resilience may help you to learn ways to practice those behaviors. General instructions  Be patient with yourself and others. Allow the grieving process to happen, and remember that grieving takes time. ? It is likely that you may never feel completely done with some grief. You may find a way to move on while still cherishing memories and feelings about your loss. ? Accepting your loss is a process. It can take months or longer to adjust.  Keep all follow-up visits as told by your health care provider. This is important. Where to find support To get support for managing loss:  Ask your health care provider for help and recommendations, such as grief counseling or therapy.  Think about joining a support group for people who are managing a loss. Where to find more information You can find more information about managing loss from:  American Society of Clinical Oncology: www.cancer.net  American Psychological Association: www.apa.org Contact a health care provider if:  Your grief is extreme and keeps getting worse.  You have ongoing grief that does not improve.  Your body shows symptoms of grief, such   as illness.  You feel depressed, anxious, or lonely. Get help right away if:  You have thoughts about hurting yourself or others. If you ever feel like you may hurt yourself or others, or have thoughts about taking your own life, get help right away. You can go to your nearest emergency department or call:  Your local emergency services (911 in the [place]  A suicide crisis helpline, such as the  [place] at 205 192 8312. This is open 24 hours a day. Summary  Grief is the result of a major change or an absence of someone or something that you count on. Grief is a normal reaction to loss.  The depth of grief and the period of recovery depend on the type of loss and your ability to adjust to the change and process your feelings.  Processing grief requires patience and a willingness to accept your feelings and talk about your loss with people who are supportive.  It is important to find resources that work for you and to realize that people experience grief differently. There is not one grieving process that works for everyone in the same way.  Be aware that when grief becomes extreme, it can lead to more severe issues like isolation, depression, anxiety, or suicidal thoughts. Talk with your health care provider if you have any of these issues. This information is not intended to replace advice given to you by your health care provider. Make sure you discuss any questions you have with your health care provider. Document Revised: 03/14/2018 Document Reviewed: 05/24/2016 Elsevier Patient Education  [place]. Complicated Grief Grief is a normal response to the death of someone close to you. Feelings of fear, anger, and guilt can affect almost everyone who loses a loved one. It is also common to have symptoms of depression while you are grieving. These include problems with sleep, loss of appetite, and lack of energy. They may last for weeks or months after a loss. Complicated grief is different from normal grief or depression. Normal grieving involves sadness and feelings of loss, but those feelings get better and heal over time. Complicated grief is a severe type of grief that lasts for a long time, usually for several months to a year or longer. It interferes with your ability to function normally. Complicated grief may require treatment from a mental  health care provider. What are the causes? The cause of this condition is not known. It is not clear why some people continue to struggle with grief and others do not. What increases the risk? You are more likely to develop this condition if:  The death of your loved one was sudden or unexpected.  The death of your loved one was due to a violent event.  Your loved one died from suicide.  Your loved one was a child or a young person.  You were very close to your loved one, or you were dependent on him or her.  You have a history of depression or anxiety. What are the signs or symptoms? Symptoms of this condition include:  Feeling disbelief or having a lack of emotion (numbness).  Being unable to enjoy good memories of your loved one.  Needing to avoid anything or anyone that reminds you of your loved one.  Being unable to stop thinking about the death.  Feeling intense anger or guilt.  Feeling alone and hopeless.  Feeling that your life is meaningless and empty.  Losing the desire to move on  with your life. How is this diagnosed? This condition may be diagnosed based on:  Your symptoms. Complicated grief will be diagnosed if you have ongoing symptoms of grief for 6-12 months or longer.  The effect of symptoms on your life. You may be diagnosed with this condition if your symptoms are interfering with your ability to live your life. Your health care provider may recommend that you see a mental health care provider. Many symptoms of depression are similar to the symptoms of complicated grief. It is important to be evaluated for complicated grief along with other mental health conditions. How is this treated? This condition is most commonly treated with talk therapy. This therapy is offered by a mental health specialist (psychiatrist). During therapy:  You will learn healthy ways to cope with the loss of your loved one.  Your mental health care provider may recommend  antidepressant medicines. Follow these instructions at home: Lifestyle   Take care of yourself. ? Eat on a regular basis, and maintain a healthy diet. Eat plenty of fruits, vegetables, lean protein, and whole grains. ? Try to get some exercise each day. Aim for 30 minutes of exercise on most days of the week. ? Keep a consistent sleep schedule. Try to get 8 or more hours of sleep each night. ? Start doing the things that you used to enjoy.  Do not use drugs or alcohol to ease your symptoms.  Spend time with friends and loved ones. General instructions  Take over-the-counter and prescription medicines only as told by your health care provider.  Consider joining a grief (bereavement) support group to help you deal with your loss.  Keep all follow-up visits as told by your health care provider. This is important. Contact a health care provider if:  Your symptoms prevent you from functioning normally.  Your symptoms do not get better with treatment. Get help right away if:  You have serious thoughts about hurting yourself or someone else.  You have suicidal feelings. If you ever feel like you may hurt yourself or others, or have thoughts about taking your own life, get help right away. You can go to your nearest emergency department or call:  Your local emergency services (911 in the [place]  A suicide crisis helpline, such as the [facility] at (619) 741-3123. This is open 24 hours a day. Summary  Complicated grief is a severe type of grief that lasts for a long time. This grief is not likely to go away on its own. Get the help you need.  Some griefs are more difficult than others and can cause this condition. You may need a certain type of treatment to help you recover if the loss of your loved one was sudden, violent, or due to suicide.  You may feel guilty about moving on with your life. Getting help does not mean that you are forgetting your loved  one. It means that you are taking care of yourself.  Complicated grief is best treated with talk therapy. Medicines may also be prescribed.  Seek the help you need, and find support that will help you recover. This information is not intended to replace advice given to you by your health care provider. Make sure you discuss any questions you have with your health care provider. Document Revised: 12/21/2016 Document Reviewed: 10/24/2016 Elsevier Patient Education  [place]. Overactive Bladder, Adult  Overactive bladder refers to a condition in which a person has a sudden need to pass urine.  The person may leak urine if he or she cannot get to the bathroom fast enough (urinary incontinence). A person with this condition may also wake up several times in the night to go to the bathroom. Overactive bladder is associated with poor nerve signals between your bladder and your brain. Your bladder may get the signal to empty before it is full. You may also have very sensitive muscles that make your bladder squeeze too soon. These symptoms might interfere with daily work or social activities. What are the causes? This condition may be associated with or caused by:  Urinary tract infection.  Infection of nearby tissues, such as the prostate.  Prostate enlargement.  Surgery on the uterus or urethra.  Bladder stones, inflammation, or tumors.  Drinking too much caffeine or alcohol.  Certain medicines, especially medicines that get rid of extra fluid in the body (diuretics).  Muscle or nerve weakness, especially from: ? A spinal cord injury. ? Stroke. ? Multiple sclerosis. ? Parkinson's disease.  Diabetes.  Constipation. What increases the risk? You may be at greater risk for overactive bladder if you:  Are an older adult.  Smoke.  Are going through menopause.  Have prostate problems.  Have a neurological disease, such as stroke, dementia, Parkinson's disease, or multiple  sclerosis (MS).  Eat or drink things that irritate the bladder. These include alcohol, spicy food, and caffeine.  Are overweight or obese. What are the signs or symptoms? Symptoms of this condition include:  Sudden, strong urge to urinate.  Leaking urine.  Urinating 8 or more times a day.  Waking up to urinate 2 or more times a night. How is this diagnosed? Your health care provider may suspect overactive bladder based on your symptoms. He or she will diagnose this condition by:  A physical exam and medical history.  Blood or urine tests. You might need bladder or urine tests to help determine what is causing your overactive bladder. You might also need to see a health care provider who specializes in urinary tract problems (urologist). How is this treated? Treatment for overactive bladder depends on the cause of your condition and whether it is mild or severe. You can also make lifestyle changes at home. Options include:  Bladder training. This may include: ? Learning to control the urge to urinate by following a schedule that directs you to urinate at regular intervals (timed voiding). ? Doing Kegel exercises to strengthen your pelvic floor muscles, which support your bladder. Toning these muscles can help you control urination, even if your bladder muscles are overactive.  Special devices. This may include: ? Biofeedback, which uses sensors to help you become aware of your body's signals. ? Electrical stimulation, which uses electrodes placed inside the body (implanted) or outside the body. These electrodes send gentle pulses of electricity to strengthen the nerves or muscles that control the bladder. ? Women may use a plastic device that fits into the vagina and supports the bladder (pessary).  Medicines. ? Antibiotics to treat bladder infection. ? Antispasmodics to stop the bladder from releasing urine at the wrong time. ? Tricyclic antidepressants to relax bladder  muscles. ? Injections of botulinum toxin type A directly into the bladder tissue to relax bladder muscles.  Lifestyle changes. This may include: ? Weight loss. Talk to your health care provider about weight loss methods that would work best for you. ? Diet changes. This may include reducing how much alcohol and caffeine you consume, or drinking fluids at different times of  the day. ? Not smoking. Do not use any products that contain nicotine or tobacco, such as cigarettes and e-cigarettes. If you need help quitting, ask your health care provider.  Surgery. ? A device may be implanted to help manage the nerve signals that control urination. ? An electrode may be implanted to stimulate electrical signals in the bladder. ? A procedure may be done to change the shape of the bladder. This is done only in very severe cases. Follow these instructions at home: Lifestyle  Make any diet or lifestyle changes that are recommended by your health care provider. These may include: ? Drinking less fluid or drinking fluids at different times of the day. ? Cutting down on caffeine or alcohol. ? Doing Kegel exercises. ? Losing weight if needed. ? Eating a healthy and balanced diet to prevent constipation. This may include:  Eating foods that are high in fiber, such as fresh fruits and vegetables, whole grains, and beans.  Limiting foods that are high in fat and processed sugars, such as fried and sweet foods. General instructions  Take over-the-counter and prescription medicines only as told by your health care provider.  If you were prescribed an antibiotic medicine, take it as told by your health care provider. Do not stop taking the antibiotic even if you start to feel better.  Use any implants or pessary as told by your health care provider.  If needed, wear pads to absorb urine leakage.  Keep a journal or log to track how much and when you drink and when you feel the need to urinate. This will  help your health care provider monitor your condition.  Keep all follow-up visits as told by your health care provider. This is important. Contact a health care provider if:  You have a fever.  Your symptoms do not get better with treatment.  Your pain and discomfort get worse.  You have more frequent urges to urinate. Get help right away if:  You are not able to control your bladder. Summary  Overactive bladder refers to a condition in which a person has a sudden need to pass urine.  Several conditions may lead to an overactive bladder.  Treatment for overactive bladder depends on the cause and severity of your condition.  Follow your health care provider's instructions about lifestyle changes, doing Kegel exercises, keeping a journal, and taking medicines. This information is not intended to replace advice given to you by your health care provider. Make sure you discuss any questions you have with your health care provider. Document Revised: 05/01/2018 Document Reviewed: 01/24/2017 Elsevier Patient Education  [place].

## 2019-11-10 NOTE — Progress Notes (Signed)
GYNECOLOGY  VISIT   HPI: 79 y.o.   Widowed White or Caucasian Not Hispanic or Latino  female   [phone] with Patient's last menstrual period was 01/23/1988 (approximate).   here for follow up for overactive bladder issues. She said that she is doing better but not well yet. She said that she has not yet been to the pelvic floor PT because her son passed away and she just has not felt like making that trip yet. She said that she also has had issues with waiting on hold and getting in touch with an actual person.   Son died a month ago at 1 after an 74 year struggle with autoimmune disorder, renal failure, had had an amputation. He had 3 teenagers. They are local. She is a little depressed. She has good support.  She lost her husband 4 years ago and her dog last year. She is living in a condo. Has lots of friends. Waiting to adopt a Restaurant manager, fast food.   She has been doing Kegels on her own, her symptoms are somewhat better. She has the bladder training sheet (needs another one). She isn't taking her vesicare every night. She has nocturia 1-2 x, improved from 3-4 x a night.  Her frequency of urination is improving, she has some urge incontinence, improving. Occurring 3-4 x a week, small amounts.  Drinks a lot of water during the day, stops drinking fluid at 8 pm. She also drinks a couple of glasses a day of tea.     GYNECOLOGIC HISTORY: Patient's last menstrual period was 01/23/1988 (approximate). Contraception:- Post menopausal  Menopausal hormone therapy: NA        OB History    Gravida  2   Para  2   Term  2   Preterm  0   AB  0   Living  2     SAB  0   TAB  0   Ectopic  0   Multiple  0   Live Births  2              Patient Active Problem List   Diagnosis Date Noted  . Atypical atrial flutter ([place])   . Chest pain 07/02/2017  . Hyperlipidemia 07/01/2017  . 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 Tear of lateral meniscus of knee 03/20/2017  . Tear of medial  meniscus of knee 03/20/2017  . Arthritis of left knee 02/21/2017  . Pain in left knee 02/21/2017  . Anticoagulated 07/08/2015  . Long term current use of anticoagulant therapy 06/09/2013  . HTN (hypertension) 03/31/2013  . Unspecified vitamin D deficiency 03/31/2013  . 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 Osteopenia 03/31/2013  . Paroxysmal atrial fibrillation ([place]) 11/06/2012  . 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 Atrial fibrillation ([place]) 09/2009   on coumadin  . Diverticulosis 09/2006  . GERD (gastroesophageal reflux disease)   . Hiatal hernia   . Hyperlipidemia   . Hypertension   . Osteopenia   . Postmenopausal hormone replacement therapy 1989 - 07/2000    Past Surgical History:  Procedure Laterality Date  . 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 BREAST SURGERY Left 4/99   breast biopsy - fibrosis  . CARDIOVERSION N/A 07/16/2017   Procedure: CARDIOVERSION;  Surgeon: Marlou Porch,  Thana Farr, MD;  Location: [facility] ENDOSCOPY;  Service: Cardiovascular;  Laterality: N/A;  . CARDIOVERSION N/A 10/20/2018   Procedure: CARDIOVERSION;  Surgeon: Buford Dresser, MD;  Location: [facility] ENDOSCOPY;  Service: Cardiovascular;  Laterality: N/A;  . CESAREAN SECTION     X 2  . HYSTEROSCOPY  4/95   w/D&C  . TEE WITHOUT CARDIOVERSION N/A 10/20/2018   Procedure: TRANSESOPHAGEAL ECHOCARDIOGRAM (TEE);  Surgeon: Buford Dresser, MD;  Location: [facility] ENDOSCOPY;  Service: Cardiovascular;  Laterality: N/A;    Current Outpatient Medications  Medication Sig Dispense Refill  . amLODipine (NORVASC) 5 MG tablet TAKE 1 TABLET BY MOUTH EVERY DAY 90 tablet 3  . aspirin 81 MG chewable tablet Chew 81 mg by mouth daily.    . Biotin w/ Vitamins C & E (HAIR/SKIN/NAILS PO) Take 1 tablet by mouth  daily.    . Calcium Carbonate-Vitamin D (CALCIUM 600+D PO) Take 1 tablet by mouth 2 (two) times daily.     Marland Kitchen ELIQUIS 5 MG TABS tablet TAKE 1 TABLET BY MOUTH TWICE A DAY 60 tablet 5  . flecainide (TAMBOCOR) 150 MG tablet TAKE 1 TABLET BY MOUTH TWICE A DAY 180 tablet 3  . methocarbamol (ROBAXIN) 500 MG tablet Take 1 tablet (500 mg total) by mouth 2 (two) times daily. 30 tablet 0  . Omega-3 Fatty Acids (OMEGA-3 PO) Take 1,200 mg by mouth 2 (two) times daily.     Marland Kitchen PROLIA 60 MG/ML SOSY injection INJECT 60MG  SUBCUTANEOUSLY  INTO UPPER ARM, THIGH, OR  ABDOMEN EVERY 6 MONTHS  (GIVEN AT PRESCRIBERS  OFFICE) 1 mL 1  . quinapril (ACCUPRIL) 40 MG tablet Take 40 mg by mouth daily.     . simvastatin (ZOCOR) 20 MG tablet Take 20 mg by mouth every evening.     . solifenacin (VESICARE) 5 MG tablet Take 5 mg by mouth at bedtime.    . tretinoin (RETIN-A) 0.1 % cream Apply 1 application topically at bedtime.   2   No current facility-administered medications for this visit.     ALLERGIES: Penicillins, Crab [shellfish allergy], and Sulfa antibiotics  Family History  Problem Relation Age of Onset  . Hypertension Mother   . Heart disease Father   . COPD Father   . Diabetes Maternal Grandfather   . Diabetes Son   . Rheum arthritis Brother   . Kidney cancer Brother   . Breast cancer Maternal Grandmother   . Diabetes Maternal Aunt   . Diabetes Maternal Aunt     Social History   Socioeconomic History  . Marital status: Widowed    Spouse name: Not on file  . Number of children: 2  . Years of education: Not on file  . Highest education level: Not on file  Occupational History  . Not on file  Tobacco Use  . Smoking status: Former Smoker    Packs/day: 0.25    Years: 12.00    Pack years: 3.00    Types: Cigarettes    Start date: 01/16/1957    Quit date: 01/23/1968    Years since quitting: 51.8  . Smokeless tobacco: Never Used  Vaping Use  . Vaping Use: Never used  Substance and Sexual Activity  .  Alcohol use: Yes    Comment: social  . Drug use: No  . Sexual activity: Not Currently    Partners: Male    Birth control/protection: Post-menopausal  Other Topics Concern  . Not on file  Social History Narrative  . Not on file   Social  Determinants of Health   Financial Resource Strain:   . Difficulty of Paying Living Expenses: Not on file  Food Insecurity:   . Worried About Charity fundraiser in the Last Year: Not on file  . Ran Out of Food in the Last Year: Not on file  Transportation Needs:   . Lack of Transportation (Medical): Not on file  . Lack of Transportation (Non-Medical): Not on file  Physical Activity:   . Days of Exercise per Week: Not on file  . Minutes of Exercise per Session: Not on file  Stress:   . Feeling of Stress : Not on file  Social Connections:   . Frequency of Communication with Friends and Family: Not on file  . Frequency of Social Gatherings with Friends and Family: Not on file  . Attends Religious Services: Not on file  . Active Member of Clubs or Organizations: Not on file  . Attends Archivist Meetings: Not on file  . Marital Status: Not on file  Intimate Partner Violence:   . Fear of Current or Ex-Partner: Not on file  . Emotionally Abused: Not on file  . Physically Abused: Not on file  . Sexually Abused: Not on file    Review of Systems  All other systems reviewed and are negative.   PHYSICAL EXAMINATION:    BP 128/74   Pulse 76   Resp 12   Ht 5\' 6"  (1.676 m)   Wt 153 lb (69.4 kg)   LMP 01/23/1988 (Approximate)   SpO2 98%   BMI 24.69 kg/m     General appearance: alert, cooperative and appears stated age  ASSESSMENT OAB, getting better with kegels and bladder training, hasn't gone to PT Grief, her son died last month. She has good support, feels she is okay.     PLAN Recommended she stop drinking caffeine, stick with water Continue with Kegels Another sheet on bladder training given She is using vesicare only  2 x a week currently She will call if she wants another referral to PT Declines counseling for her grief   An After Visit Summary was printed and given to the patient.  22 minutes in total patient care.

## 2019-11-16 ENCOUNTER — Other Ambulatory Visit: Payer: Self-pay | Admitting: Cardiology

## 2019-11-16 NOTE — Telephone Encounter (Signed)
Age 79, weight 69kg, SCr 0.98 on 07/17/19 afib indication, last OV Sept 2021

## 2019-11-27 DIAGNOSIS — Z1231 Encounter for screening mammogram for malignant neoplasm of breast: Secondary | ICD-10-CM | POA: Diagnosis not present

## 2019-12-04 DIAGNOSIS — R928 Other abnormal and inconclusive findings on diagnostic imaging of breast: Secondary | ICD-10-CM | POA: Diagnosis not present

## 2019-12-04 DIAGNOSIS — Z803 Family history of malignant neoplasm of breast: Secondary | ICD-10-CM | POA: Diagnosis not present

## 2019-12-04 DIAGNOSIS — R922 Inconclusive mammogram: Secondary | ICD-10-CM | POA: Diagnosis not present

## 2019-12-15 ENCOUNTER — Other Ambulatory Visit: Payer: Self-pay | Admitting: Radiology

## 2019-12-15 DIAGNOSIS — D0511 Intraductal carcinoma in situ of right breast: Secondary | ICD-10-CM | POA: Diagnosis not present

## 2019-12-15 DIAGNOSIS — R921 Mammographic calcification found on diagnostic imaging of breast: Secondary | ICD-10-CM | POA: Diagnosis not present

## 2019-12-16 ENCOUNTER — Encounter: Payer: Self-pay | Admitting: Family Medicine

## 2019-12-16 ENCOUNTER — Telehealth: Payer: Self-pay | Admitting: Oncology

## 2019-12-16 ENCOUNTER — Encounter: Payer: Self-pay | Admitting: *Deleted

## 2019-12-16 DIAGNOSIS — D0511 Intraductal carcinoma in situ of right breast: Secondary | ICD-10-CM | POA: Insufficient documentation

## 2019-12-16 NOTE — Telephone Encounter (Signed)
Spoke to patient to confirm afternoon [facility] appointment for 12/1, [facility] [place] [facility]

## 2019-12-22 NOTE — Progress Notes (Addendum)
[place]  [phone] [phone]     ID: [name] DOB: Jun 11, 1940  MR#: [id number]  XTK#:[id number]  Patient Care Team: Vernie Shanks, MD as PCP - General (Family Medicine) Jettie Booze, MD as PCP - Cardiology (Cardiology) Constance Haw, MD as PCP - Electrophysiology (Cardiology) Mauro Kaufmann, RN as Oncology Nurse Navigator Rockwell Germany, RN as Oncology Nurse Navigator Coralie Keens, MD as Consulting Physician (General Surgery) Juniper Snyders, Virgie Dad, MD as Consulting Physician (Oncology) Kyung Rudd, MD as Consulting Physician (Radiation Oncology) Martinique, Amy, MD as Consulting Physician (Dermatology) Salvadore Dom, MD as Consulting Physician (Obstetrics and Gynecology) [facility], Melanie Crazier, MD as Consulting Physician (Endocrinology) Chauncey Cruel, MD OTHER MD:  CHIEF COMPLAINT: estrogen receptor positive noninvasive breast cancer  CURRENT TREATMENT: Awaiting definitive surgery   HISTORY OF CURRENT ILLNESS: Anna Mathews reports a history of bilateral benign breast tumors, in 71 (age 52) and 74 (age 77).  She had no problems with those minor surgeries.  She had routine screening mammography on 11/27/2019 showing calcifications in the right breast. She underwent right diagnostic mammography with tomography at [facility] on 12/04/2019 showing: breast density category C; 0.4 cm linear calcifications in upper-inner right breast.  Accordingly on 12/15/2019 she proceeded to biopsy of the right breast area in question. The pathology from this procedure ([id number]) showed: ductal carcinoma in situ, high grade with focal necrosis. Prognostic indicators significant for: estrogen receptor, 95% positive and progesterone receptor, 40% positive, both with strong staining intensity.   [name] subsequent history is as detailed below.   INTERVAL HISTORY: Anna Mathews was evaluated in the multidisciplinary breast  cancer clinic on 12/23/2019 accompanied by her daughter Anna Mathews. Her case was also presented at the multidisciplinary breast cancer conference on the same day. At that time a preliminary plan was proposed: Lumpectomy then either radiation or antiestrogens but not both   REVIEW OF SYSTEMS: There were no specific symptoms leading to the original mammogram, which was routinely scheduled. On the provided questionnaire, Anna Mathews reports wearing glasses, dribbling urine, history of skin cancer (nonmelanoma), and "calcium deposit in right breast." The patient denies unusual headaches, visual changes, nausea, vomiting, stiff neck, dizziness, or gait imbalance. There has been no cough, phlegm production, or pleurisy, no chest pain or pressure, and no change in bowel or bladder habits. The patient denies fever, rash, bleeding, unexplained fatigue or unexplained weight loss. A detailed review of systems was otherwise entirely negative.   COVID 19 VACCINATION STATUS: Status post both doses plus booster November 2021   PAST MEDICAL HISTORY: Past Medical History:  Diagnosis Date  . Atrial fibrillation ([place]) 09/2009   on coumadin  . Breast cancer ([place])   . Diverticulosis 09/2006  . GERD (gastroesophageal reflux disease)   . Hiatal hernia   . Hyperlipidemia   . Hypertension   . Osteopenia   . Postmenopausal hormone replacement therapy 1989 - 07/2000  Skin cancer (nonmelanoma) - 2020   PAST SURGICAL HISTORY: Past Surgical History:  Procedure Laterality Date  . 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 BREAST SURGERY Left 4/99   breast biopsy - fibrosis  . CARDIOVERSION N/A 07/16/2017   Procedure: CARDIOVERSION;  Surgeon: Jerline Pain, MD;  Location: [facility] ENDOSCOPY;  Service: Cardiovascular;  Laterality: N/A;  . CARDIOVERSION N/A 10/20/2018   Procedure: CARDIOVERSION;   Surgeon:  Buford Dresser, MD;  Location: [facility] ENDOSCOPY;  Service: Cardiovascular;  Laterality: N/A;  . CESAREAN SECTION     X 2  . HYSTEROSCOPY  4/95   w/D&C  . TEE WITHOUT CARDIOVERSION N/A 10/20/2018   Procedure: TRANSESOPHAGEAL ECHOCARDIOGRAM (TEE);  Surgeon: Buford Dresser, MD;  Location: [facility] ENDOSCOPY;  Service: Cardiovascular;  Laterality: N/A;    FAMILY HISTORY: Family History  Problem Relation Age of Onset  . Hypertension Mother   . Heart disease Father   . COPD Father   . Diabetes Maternal Grandfather   . Diabetes Son   . Rheum arthritis Brother   . Kidney cancer Brother   . Breast cancer Maternal Grandmother   . Diabetes Maternal Aunt   . Diabetes Maternal Aunt    Her mother died at age 24 from [place]. Her father died at age 67 from COPD/E. [name] has one brother (and no sisters), and he was diagnosed with renal cell carcinoma. There is no other family history of cancer to her knowledge.   GYNECOLOGIC HISTORY:  Patient's last menstrual period was 01/23/1988 (approximate). Menarche: 79 years old Age at first live birth: 79 years old [place] P 2 LMP 1989 Contraceptive: used for about 10 years HRT: never used  Hysterectomy? no BSO? 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 She is widowed. (Her husband was Anna Mathews.) She lives in a condo by herself with no pets. Daughter Anna Mathews, age 32, is a Electrical engineer with Hotel manager in advertising here in [place], and her husband being a Proofreader. Son Anna Mathews, age 69, is a Marine scientist in [employer]. [name] has 5 grandchildren. She attends [employer].    ADVANCED DIRECTIVES: in place, and intends to name her daughter Anna Mathews as her HCPOA.   HEALTH MAINTENANCE: Social History   Tobacco Use  . Smoking status: Former Smoker    Packs/day: 0.25    Years: 12.00    Pack years: 3.00    Types: Cigarettes     Start date: 01/16/1957    Quit date: 01/23/1968    Years since quitting: 51.9  . Smokeless tobacco: Never Used  Vaping Use  . Vaping Use: Never used  Substance Use Topics  . Alcohol use: Yes    Comment: social  . Drug use: No     Colonoscopy: Fall 2019  PAP: 2019  Bone density: 2017, now on Prolia   Allergies  Allergen Reactions  . Penicillins Other (See Comments)    REACTION: "ITCHING IN EYES" Has patient had a PCN reaction causing immediate rash, facial/tongue/throat swelling, SOB or lightheadedness with hypotension: Unknown Has patient had a PCN reaction causing severe rash involving mucus membranes or skin necrosis: Unknown Has patient had a PCN reaction that required hospitalization: No Has patient had a PCN reaction occurring within the last 10 years: No If all of the above answers are "NO", then may proceed with Cephalosporin use.   Otho Darner Allergy] Itching    Full body itching  . Sulfa Antibiotics Other (See Comments)    REACTION: " NOT SURE"    Current Outpatient Medications  Medication Sig Dispense Refill  . amLODipine (NORVASC) 5 MG tablet TAKE 1 TABLET BY MOUTH EVERY DAY 90 tablet 3  . aspirin 81 MG chewable tablet Chew 81 mg by mouth daily.    . Biotin w/ Vitamins C & E (HAIR/SKIN/NAILS PO) Take 1 tablet by mouth daily.    Marland Kitchen  Calcium Carbonate-Vitamin D (CALCIUM 600+D PO) Take 1 tablet by mouth 2 (two) times daily.     Marland Kitchen ELIQUIS 5 MG TABS tablet TAKE 1 TABLET BY MOUTH TWICE A DAY 60 tablet 5  . flecainide (TAMBOCOR) 150 MG tablet TAKE 1 TABLET BY MOUTH TWICE A DAY 180 tablet 3  . methocarbamol (ROBAXIN) 500 MG tablet Take 1 tablet (500 mg total) by mouth 2 (two) times daily. 30 tablet 0  . Omega-3 Fatty Acids (OMEGA-3 PO) Take 1,200 mg by mouth 2 (two) times daily.     Marland Kitchen PROLIA 60 MG/ML SOSY injection INJECT 60MG SUBCUTANEOUSLY  INTO UPPER ARM, THIGH, OR  ABDOMEN EVERY 6 MONTHS  (GIVEN AT PRESCRIBERS  OFFICE) 1 mL 1  . quinapril (ACCUPRIL) 40 MG tablet  Take 40 mg by mouth daily.     . simvastatin (ZOCOR) 20 MG tablet Take 20 mg by mouth every evening.     . solifenacin (VESICARE) 5 MG tablet Take 5 mg by mouth at bedtime.    . tretinoin (RETIN-A) 0.1 % cream Apply 1 application topically at bedtime.   2   No current facility-administered medications for this visit.    OBJECTIVE: White woman in no acute distress  Vitals:   12/23/19 1412  BP: (!) 137/42  Pulse: 69  Resp: 17  Temp: 98.5 F (36.9 C)  SpO2: 98%     Body mass index is 23.23 kg/m.   Wt Readings from Last 3 Encounters:  12/23/19 143 lb 14.4 oz (65.3 kg)  11/10/19 153 lb (69.4 kg)  10/13/19 141 lb 6.4 oz (64.1 kg)      ECOG FS:1 - Symptomatic but completely ambulatory  Ocular: Sclerae unicteric, pupils round and equal Ear-nose-throat: Wearing a mask Lymphatic: No cervical or supraclavicular adenopathy Lungs no rales or rhonchi Heart regular rate and rhythm Abd soft, nontender, positive bowel sounds MSK no focal spinal tenderness, no joint edema Neuro: non-focal, well-oriented, appropriate affect Breasts: The right breast is status post recent biopsy.  There is a large ecchymosis.  There is no palpable mass.  The left breast is benign.  Both axillae are benign.   LAB RESULTS:  CMP     Component Value Date/Time   NA 142 12/23/2019 1411   NA 141 10/14/2018 1434   K 4.6 12/23/2019 1411   CL 106 12/23/2019 1411   CO2 28 12/23/2019 1411   GLUCOSE 133 (H) 12/23/2019 1411   BUN 30 (H) 12/23/2019 1411   BUN 26 10/14/2018 1434   CREATININE 1.12 (H) 12/23/2019 1411   CALCIUM 9.8 12/23/2019 1411   PROT 6.7 12/23/2019 1411   PROT 6.6 12/24/2016 0900   ALBUMIN 3.9 12/23/2019 1411   ALBUMIN 4.4 12/24/2016 0900   AST 20 12/23/2019 1411   ALT 15 12/23/2019 1411   ALKPHOS 47 12/23/2019 1411   BILITOT 0.5 12/23/2019 1411   GFRNONAA 50 (L) 12/23/2019 1411   GFRAA 66 10/14/2018 1434    No results found for: TOTALPROTELP, ALBUMINELP, A1GS, A2GS, BETS, BETA2SER,  GAMS, MSPIKE, SPEI  Lab Results  Component Value Date   WBC 5.9 12/23/2019   NEUTROABS 4.3 12/23/2019   HGB 12.7 12/23/2019   HCT 38.6 12/23/2019   MCV 94.8 12/23/2019   PLT 223 12/23/2019    No results found for: LABCA2  No components found for: XVQMGQ676  No results for input(s): INR in the last 168 hours.  No results found for: LABCA2  No results found for: PPJ093  No results found for: OIZ124  No results found for: IBB048  No results found for: CA2729  No components found for: HGQUANT  No results found for: CEA1 / No results found for: CEA1   No results found for: AFPTUMOR  No results found for: CHROMOGRNA  No results found for: KPAFRELGTCHN, LAMBDASER, KAPLAMBRATIO (kappa/lambda light chains)  No results found for: HGBA, HGBA2QUANT, HGBFQUANT, HGBSQUAN (Hemoglobinopathy evaluation)   No results found for: LDH  No results found for: IRON, TIBC, IRONPCTSAT (Iron and TIBC)  No results found for: FERRITIN  Urinalysis    Component Value Date/Time   COLORURINE YELLOW 10/27/2007 1414   APPEARANCEUR CLEAR 10/27/2007 1414   LABSPEC 1.013 10/27/2007 1414   PHURINE 7.5 10/27/2007 1414   GLUCOSEU NEGATIVE 10/27/2007 1414   HGBUR NEGATIVE 10/27/2007 1414   BILIRUBINUR NEGATIVE 10/27/2007 1414   KETONESUR NEGATIVE 10/27/2007 1414   PROTEINUR NEGATIVE 10/27/2007 1414   UROBILINOGEN 0.2 10/27/2007 1414   NITRITE NEGATIVE 10/27/2007 1414   LEUKOCYTESUR TRACE (A) 10/27/2007 1414     STUDIES: No results found.   ELIGIBLE FOR AVAILABLE RESEARCH PROTOCOL: no  ASSESSMENT: 79 y.o. [place] woman status post right breast biopsy 12/15/2019 for ductal carcinoma in situ, grade 3, estrogen and progesterone receptor positive  (1) definitive surgery pending  (2) adjuvant radiation to follow  (3) likely no need for adjuvant antiestrogens  PLAN: I met today with Anna Mathews to review her new diagnosis. Specifically we discussed the biology of her breast cancer, its  diagnosis, staging, treatment  options and prognosis.Anna Mathews understands that in noninvasive ductal carcinoma, also called ductal carcinoma in situ ("DCIS") the breast cancer cells remain trapped in the ducts were they started. They cannot travel to a vital organ. For that reason these cancers in themselves are not life-threatening.  If the whole breast is removed then all the ducts are removed and since the cancer cells are trapped in the ducts, the cure rate with mastectomy for noninvasive breast cancer is approximately 99%. Nevertheless we recommend lumpectomy, because there is no survival advantage to mastectomy and because the cosmetic result is generally superior with breast conservation.  Since the patient is keeping her breasts, there will be some risk of recurrence. The recurrence can only be in the same breast since, again, the cells are trapped in the ducts. There is no connection from one breast to the other. The risk of local recurrence is cut by more than half with radiation, which is standard in this situation.  In estrogen receptor positive cancers like Anna Mathews, anti-estrogens can also be considered. They will further reduce the risk of recurrence by one half. In addition anti-estrogens will lower the risk of a new breast cancer developing in either breast, also by one half. That risk otherwise approaches 1% per year.   However given the very small size of this tumor and the very favorable prognostic panel as well as the patient's age my own feeling is that antiestrogens for 5 years are really not warranted.  She does have history of A. fib and is on anticoagulation so in that sense tamoxifen would be relatively contraindicated.  She has osteoporosis so even though she is on Prolia aromatase inhibitors are relatively contraindicated.  I would favor that she receive radiation alone which will significant reduce the risk of local recurrence and that is the only risk associated with this cancer.   Accordingly the preliminary plan is for surgery then radiation, no antiestrogens.  I am going to see her again sometime in March 2022 this to make  sure there are no residual questions and assuming all is well she will likely be released from follow-up at that time.  Anna Mathews has a good understanding of the overall plan. She agrees with it. She knows the goal of treatment in her case is cure. She will call with any problems that may develop before her next visit here.  Total encounter time 55 minutes.Sarajane Jews C. Ariel Dimitri, MD 12/23/2019 3:20 PM Medical Oncology and Hematology [facility] [place], [place] [address] Tel. [phone]    Fax. [phone]   This document serves as a record of services personally performed by Lurline Del, MD. It was created on his behalf by Wilburn Mylar, a trained medical scribe. The creation of this record is based on the scribe's personal observations and the provider's statements to them.   I, Lurline Del MD, have reviewed the above documentation for accuracy and completeness, and I agree with the above.    *Total Encounter Time as defined by the Centers for Medicare and Medicaid Services includes, in addition to the face-to-face time of a patient visit (documented in the note above) non-face-to-face time: obtaining and reviewing outside history, ordering and reviewing medications, tests or procedures, care coordination (communications with other health care professionals or caregivers) and documentation in the medical record.

## 2019-12-23 ENCOUNTER — Ambulatory Visit: Payer: Medicare PPO | Admitting: Physical Therapy

## 2019-12-23 ENCOUNTER — Encounter: Payer: Self-pay | Admitting: Oncology

## 2019-12-23 ENCOUNTER — Inpatient Hospital Stay: Payer: Medicare PPO

## 2019-12-23 ENCOUNTER — Other Ambulatory Visit: Payer: Self-pay | Admitting: Surgery

## 2019-12-23 ENCOUNTER — Encounter: Payer: Self-pay | Admitting: General Practice

## 2019-12-23 ENCOUNTER — Other Ambulatory Visit: Payer: Self-pay

## 2019-12-23 ENCOUNTER — Inpatient Hospital Stay: Payer: Medicare PPO | Attending: Oncology | Admitting: Oncology

## 2019-12-23 ENCOUNTER — Ambulatory Visit
Admission: RE | Admit: 2019-12-23 | Discharge: 2019-12-23 | Disposition: A | Discharge status: Home / Self Care | Payer: Medicare PPO | Source: Ambulatory Visit | Attending: Radiation Oncology | Admitting: Radiation Oncology

## 2019-12-23 VITALS — BP 137/42 | HR 69 | Temp 98.5°F | Resp 17 | Ht 66.0 in | Wt 143.9 lb

## 2019-12-23 DIAGNOSIS — D0511 Intraductal carcinoma in situ of right breast: Secondary | ICD-10-CM

## 2019-12-23 DIAGNOSIS — M810 Age-related osteoporosis without current pathological fracture: Secondary | ICD-10-CM | POA: Diagnosis not present

## 2019-12-23 DIAGNOSIS — I4891 Unspecified atrial fibrillation: Secondary | ICD-10-CM | POA: Insufficient documentation

## 2019-12-23 DIAGNOSIS — Z7901 Long term (current) use of anticoagulants: Secondary | ICD-10-CM | POA: Insufficient documentation

## 2019-12-23 DIAGNOSIS — I480 Paroxysmal atrial fibrillation: Secondary | ICD-10-CM

## 2019-12-23 DIAGNOSIS — Z170 Estrogen receptor positive status [ER+]: Secondary | ICD-10-CM | POA: Diagnosis not present

## 2019-12-23 LAB — CMP (CANCER CENTER ONLY)
ALT: 15 U/L (ref 0–44)
AST: 20 U/L (ref 15–41)
Albumin: 3.9 g/dL (ref 3.5–5.0)
Alkaline Phosphatase: 47 U/L (ref 38–126)
Anion gap: 8 (ref 5–15)
BUN: 30 mg/dL — ABNORMAL HIGH (ref 8–23)
CO2: 28 mmol/L (ref 22–32)
Calcium: 9.8 mg/dL (ref 8.9–10.3)
Chloride: 106 mmol/L (ref 98–111)
Creatinine: 1.12 mg/dL — ABNORMAL HIGH (ref 0.44–1.00)
GFR, Estimated: 50 mL/min — ABNORMAL LOW (ref >60)
Glucose, Bld: 133 mg/dL — ABNORMAL HIGH (ref 70–99)
Potassium: 4.6 mmol/L (ref 3.5–5.1)
Sodium: 142 mmol/L (ref 135–145)
Total Bilirubin: 0.5 mg/dL (ref 0.3–1.2)
Total Protein: 6.7 g/dL (ref 6.5–8.1)

## 2019-12-23 LAB — CBC WITH DIFFERENTIAL (CANCER CENTER ONLY)
Abs Immature Granulocytes: 0.01 10*3/uL (ref 0.00–0.07)
Basophils Absolute: 0 10*3/uL (ref 0.0–0.1)
Basophils Relative: 0 %
Eosinophils Absolute: 0.1 10*3/uL (ref 0.0–0.5)
Eosinophils Relative: 1 %
HCT: 38.6 % (ref 36.0–46.0)
Hemoglobin: 12.7 g/dL (ref 12.0–15.0)
Immature Granulocytes: 0 %
Lymphocytes Relative: 19 %
Lymphs Abs: 1.1 10*3/uL (ref 0.7–4.0)
MCH: 31.2 pg (ref 26.0–34.0)
MCHC: 32.9 g/dL (ref 30.0–36.0)
MCV: 94.8 fL (ref 80.0–100.0)
Monocytes Absolute: 0.4 10*3/uL (ref 0.1–1.0)
Monocytes Relative: 7 %
Neutro Abs: 4.3 10*3/uL (ref 1.7–7.7)
Neutrophils Relative %: 73 %
Platelet Count: 223 10*3/uL (ref 150–400)
RBC: 4.07 MIL/uL (ref 3.87–5.11)
RDW: 13.1 % (ref 11.5–15.5)
WBC Count: 5.9 10*3/uL (ref 4.0–10.5)
nRBC: 0 % (ref 0.0–0.2)

## 2019-12-23 LAB — GENETIC SCREENING ORDER

## 2019-12-23 NOTE — Progress Notes (Signed)
[place] Psychosocial Distress Screening Spiritual Care  Met with Enijah and her daughter in [facility] to introduce [place] team/resources, reviewing distress screen per protocol.  The patient scored a 2 on the Psychosocial Distress Thermometer which indicates mild distress. Also assessed for distress and other psychosocial needs.   ONCBCN DISTRESS SCREENING 12/23/2019  Screening Type Initial Screening  Distress experienced in past week (1-10) 2  Emotional problem type Adjusting to illness  Spiritual/Religous concerns type Facing my mortality  Referral to support programs Yes   Ms Robledo reports good support from her neighbors and her daughter, who lives three blocks away. She reports minimal distress and significant gratitude that her situation is not life-threatening.  Follow up needed: No. They report no other needs at this time, but are aware of ongoing Spiritual Care and Support Team availability and programming.   [place], [place], [facility] Pager [phone] Voicemail [phone]

## 2019-12-23 NOTE — Progress Notes (Signed)
Radiation Oncology         [phone] ________________________________  Name: Anna Mathews        MRN: [id number]  Date of Service: 12/23/2019 DOB: [birth year]  SH:FWYO, Anna Shell, MD  Coralie Keens, MD     REFERRING PHYSICIAN: Coralie Keens, MD   DIAGNOSIS: The encounter diagnosis was Ductal carcinoma in situ (DCIS) of right breast.   HISTORY OF PRESENT ILLNESS: Anna Mathews is a 79 y.o. female seen in the multidisciplinary breast clinic for a new diagnosis of right breast cancer. The patient was noted to have a screening detected grouping of calcifications in the right breast.  Diagnostic imaging revealed this grouping spanning a total of 4 mm in the upper inner quadrant.  She underwent a biopsy of these on 02/14/2019 which revealed a high-grade DCIS that was ER/PR positive.  She is seen today to discuss treatment recommendations for her cancer.    PREVIOUS RADIATION THERAPY: No   PAST MEDICAL HISTORY:  Past Medical History:  Diagnosis Date  . Atrial fibrillation ([place]) 09/2009   on coumadin  . Diverticulosis 09/2006  . GERD (gastroesophageal reflux disease)   . Hiatal hernia   . Hyperlipidemia   . Hypertension   . Osteopenia   . Postmenopausal hormone replacement therapy 1989 - 07/2000       PAST SURGICAL HISTORY: Past Surgical History:  Procedure Laterality Date  . 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 BREAST SURGERY Left 4/99   breast biopsy - fibrosis  . CARDIOVERSION N/A 07/16/2017   Procedure: CARDIOVERSION;  Surgeon: Jerline Pain, MD;  Location: [facility] ENDOSCOPY;  Service: Cardiovascular;  Laterality: N/A;  . CARDIOVERSION N/A 10/20/2018   Procedure: CARDIOVERSION;  Surgeon: Buford Dresser, MD;  Location: [facility] ENDOSCOPY;  Service: Cardiovascular;  Laterality: N/A;  . CESAREAN SECTION     X 2  . HYSTEROSCOPY   4/95   w/D&C  . TEE WITHOUT CARDIOVERSION N/A 10/20/2018   Procedure: TRANSESOPHAGEAL ECHOCARDIOGRAM (TEE);  Surgeon: Buford Dresser, MD;  Location: [facility] ENDOSCOPY;  Service: Cardiovascular;  Laterality: N/A;     FAMILY HISTORY:  Family History  Problem Relation Age of Onset  . Hypertension Mother   . Heart disease Father   . COPD Father   . Diabetes Maternal Grandfather   . Diabetes Son   . Rheum arthritis Brother   . Kidney cancer Brother   . Breast cancer Maternal Grandmother   . Diabetes Maternal Aunt   . Diabetes Maternal Aunt      SOCIAL HISTORY:  reports that she quit smoking about 51 years ago. Her smoking use included cigarettes. She started smoking about 62 years ago. She has a 3.00 pack-year smoking history. She has never used smokeless tobacco. She reports current alcohol use. She reports that she does not use drugs.  The patient is widowed and resides in [place]. She is accompanied by her daughter [name].     ALLERGIES: Penicillins, Crab [shellfish allergy], and Sulfa antibiotics   MEDICATIONS:  Current Outpatient Medications  Medication Sig Dispense Refill  . amLODipine (NORVASC) 5 MG tablet TAKE 1 TABLET BY MOUTH EVERY DAY 90 tablet 3  . aspirin 81 MG chewable tablet Chew 81 mg by mouth daily.    . Biotin w/ Vitamins C & E (HAIR/SKIN/NAILS PO) Take 1 tablet by mouth daily.    . Calcium Carbonate-Vitamin  D (CALCIUM 600+D PO) Take 1 tablet by mouth 2 (two) times daily.     Marland Kitchen ELIQUIS 5 MG TABS tablet TAKE 1 TABLET BY MOUTH TWICE A DAY 60 tablet 5  . flecainide (TAMBOCOR) 150 MG tablet TAKE 1 TABLET BY MOUTH TWICE A DAY 180 tablet 3  . methocarbamol (ROBAXIN) 500 MG tablet Take 1 tablet (500 mg total) by mouth 2 (two) times daily. 30 tablet 0  . Omega-3 Fatty Acids (OMEGA-3 PO) Take 1,200 mg by mouth 2 (two) times daily.     Marland Kitchen PROLIA 60 MG/ML SOSY injection INJECT 60MG  SUBCUTANEOUSLY  INTO UPPER ARM, THIGH, OR  ABDOMEN EVERY 6 MONTHS  (GIVEN AT  PRESCRIBERS  OFFICE) 1 mL 1  . quinapril (ACCUPRIL) 40 MG tablet Take 40 mg by mouth daily.     . simvastatin (ZOCOR) 20 MG tablet Take 20 mg by mouth every evening.     . solifenacin (VESICARE) 5 MG tablet Take 5 mg by mouth at bedtime.    . tretinoin (RETIN-A) 0.1 % cream Apply 1 application topically at bedtime.   2   No current facility-administered medications for this encounter.     REVIEW OF SYSTEMS: On review of systems, the patient reports that she is doing well overall. She has bruising of her breast but denies any pain in the site.    PHYSICAL EXAM:  Wt Readings from Last 3 Encounters:  11/10/19 153 lb (69.4 kg)  10/13/19 141 lb 6.4 oz (64.1 kg)  09/08/19 145 lb 6.4 oz (66 kg)   Temp Readings from Last 3 Encounters:  09/08/19 97.7 F (36.5 C) (Skin)  03/11/19 98.1 F (36.7 C) (Oral)  10/20/18 97.8 F (36.6 C) (Oral)   BP Readings from Last 3 Encounters:  11/10/19 128/74  10/13/19 (!) 110/54  09/08/19 122/80   Pulse Readings from Last 3 Encounters:  11/10/19 76  10/13/19 75  09/08/19 76    In general this is a well appearing caucasian female in no acute distress. She's alert and oriented x4 and appropriate throughout the examination. Cardiopulmonary assessment is negative for acute distress and she exhibits normal effort. Bilateral breast exam is deferred but she shows me a large ecchymosis over the majority of the right breast since her biopsy.    ECOG = 0  0 - Asymptomatic (Fully active, able to carry on all predisease activities without restriction)  1 - Symptomatic but completely ambulatory (Restricted in physically strenuous activity but ambulatory and able to carry out work of a light or sedentary nature. For example, light housework, office work)  2 - Symptomatic, <50% in bed during the day (Ambulatory and capable of all self care but unable to carry out any work activities. Up and about more than 50% of waking hours)  3 - Symptomatic, >50% in bed,  but not bedbound (Capable of only limited self-care, confined to bed or chair 50% or more of waking hours)  4 - Bedbound (Completely disabled. Cannot carry on any self-care. Totally confined to bed or chair)  5 - Death   Eustace Pen MM, Creech RH, Tormey DC, et al. [phone]). "Toxicity and response criteria of the [facility]". [place] Oncol. 5 (6): 649-55    LABORATORY DATA:  Lab Results  Component Value Date   WBC 8.7 10/14/2018   HGB 14.5 10/14/2018   HCT 42.5 10/14/2018   MCV 91 10/14/2018   PLT 230 10/14/2018   Lab Results  Component Value Date   NA  141 10/14/2018   K 5.4 (H) 10/14/2018   CL 104 10/14/2018   CO2 25 10/14/2018   Lab Results  Component Value Date   ALT 22 12/24/2016   AST 23 12/24/2016   ALKPHOS 80 12/24/2016   BILITOT 0.5 12/24/2016      RADIOGRAPHY: No results found.     IMPRESSION/PLAN: 1. 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 Dr. Lisbeth Renshaw discusses the pathology findings and reviews the nature of noninvasive right breast disease. The consensus from the breast conference includes breast conservation with lumpectomy.  Based on her final results of surgery and margin status, radiotherapy could be considered, but as Dr. Jana Hakim has determined she would not be a great candidate for antiestrogen therapy, she would rather benefit more from radiotherapy. She is in agreement. We discussed the risks, benefits, short, and long term effects of radiotherapy, as well as the curative intent, and  Dr. Lisbeth Renshaw discusses the delivery and logistics of radiotherapy and anticipates a course of 4 weeks of radiotherapy based on her current work-up. We will see her back a few weeks after surgery  to discuss the simulation process and anticipate we starting radiotherapy about 4-6 weeks after surgery.   In a visit lasting 60 minutes, greater than 50% of the time was spent face to face reviewing her case, as well as in preparation of, discussing, and  coordinating the patient's care.  The above documentation reflects my direct findings during this shared patient visit. Please see the separate note by Dr. Lisbeth Renshaw on this date for the remainder of the patient's plan of care.    Carola Rhine, PAC

## 2019-12-24 ENCOUNTER — Encounter: Payer: Self-pay | Admitting: *Deleted

## 2019-12-25 DIAGNOSIS — R7303 Prediabetes: Secondary | ICD-10-CM | POA: Diagnosis not present

## 2019-12-25 DIAGNOSIS — I480 Paroxysmal atrial fibrillation: Secondary | ICD-10-CM | POA: Diagnosis not present

## 2019-12-25 DIAGNOSIS — I10 Essential (primary) hypertension: Secondary | ICD-10-CM | POA: Diagnosis not present

## 2019-12-25 DIAGNOSIS — D0511 Intraductal carcinoma in situ of right breast: Secondary | ICD-10-CM | POA: Diagnosis not present

## 2019-12-25 DIAGNOSIS — M8580 Other specified disorders of bone density and structure, unspecified site: Secondary | ICD-10-CM | POA: Diagnosis not present

## 2019-12-25 DIAGNOSIS — Z7901 Long term (current) use of anticoagulants: Secondary | ICD-10-CM | POA: Diagnosis not present

## 2019-12-25 DIAGNOSIS — E782 Mixed hyperlipidemia: Secondary | ICD-10-CM | POA: Diagnosis not present

## 2019-12-31 ENCOUNTER — Encounter: Payer: Self-pay | Admitting: *Deleted

## 2019-12-31 ENCOUNTER — Telehealth: Payer: Self-pay | Admitting: *Deleted

## 2019-12-31 DIAGNOSIS — D0511 Intraductal carcinoma in situ of right breast: Secondary | ICD-10-CM

## 2019-12-31 NOTE — Telephone Encounter (Signed)
Spoke to pt concerning [place] from 12.1.21. Denies question or concerns regarding dx or treatment care plan. Encourage pt to call with needs. Received verbal understanding.

## 2020-01-04 ENCOUNTER — Inpatient Hospital Stay
Admission: RE | Admit: 2020-01-04 | Discharge: 2020-01-04 | Disposition: A | Discharge status: Home / Self Care | Payer: Self-pay | Source: Ambulatory Visit | Attending: Radiation Oncology | Admitting: Radiation Oncology

## 2020-01-04 ENCOUNTER — Other Ambulatory Visit: Payer: Self-pay | Admitting: Radiation Oncology

## 2020-01-04 DIAGNOSIS — C50919 Malignant neoplasm of unspecified site of unspecified female breast: Secondary | ICD-10-CM

## 2020-01-19 DIAGNOSIS — Z1389 Encounter for screening for other disorder: Secondary | ICD-10-CM | POA: Diagnosis not present

## 2020-01-19 DIAGNOSIS — Z0000 Encounter for general adult medical examination without abnormal findings: Secondary | ICD-10-CM | POA: Diagnosis not present

## 2020-01-19 NOTE — Progress Notes (Signed)
[facility] [place] [place] [id number] [id number] Phone: [phone] Fax: [phone]  [facility] [place], [place] [address] Phone: [phone] Fax: [phone]  [facility] ([place], [address] [place] [address] Phone: [phone] Fax: [phone]      Your procedure is scheduled on January 5  Report to [facility] Main Entrance "A" at [place] and check in at the Admitting office.  Call this number if you have problems the morning of surgery:  [phone]  Call [phone] if you have any questions prior to your surgery date Monday-Friday 8am-4pm    Remember:  Do not eat after midnight the night before your surgery  You may drink clear liquids until 0815 am the morning of your surgery.   Clear liquids allowed are: Water, Non-Citrus Juices (without pulp), Carbonated Beverages, Clear Tea, Black Coffee Only, and Gatorade   Please complete your PRE-SURGERY ENSURE that was provided to you by 0815 the morning of surgery.  Please, if able, drink it in one setting. DO NOT SIP.   Take these medicines the morning of surgery with A SIP OF WATER  acetaminophen (TYLENOL)  If needed amLODipine (NORVASC) flecainide (TAMBOCOR) Eye drops if needed  Follow your surgeon's instructions on when to stop ELIQUIS and  Aspirin.  If no instructions were given by your surgeon then you will need to call the office to get those instructions.    As of today, STOP taking any Aleve, Naproxen, Ibuprofen, Motrin, Advil, Goody's, BC's, all herbal medications, fish oil, and all vitamins.                      Do not wear jewelry, make up, or nail polish            Do not wear lotions, powders, perfumes, or deodorant.            Do not shave 48 hours prior to surgery.              Do not bring  valuables to the hospital.            [facility] is not responsible for any belongings or valuables.  Do NOT Smoke (Tobacco/Vaping) or drink Alcohol 24 hours prior to your procedure If you use a CPAP at night, you may bring all equipment for your overnight stay.   Contacts, glasses, dentures or bridgework may not be worn into surgery.      For patients admitted to the hospital, discharge time will be determined by your treatment team.   Patients discharged the day of surgery will not be allowed to drive home, and someone needs to stay with them for 24 hours.    Special instructions:   [place]- Preparing For Surgery  Before surgery, you can play an important role. Because skin is not sterile, your skin needs to be as free of germs as possible. You can reduce the number of germs on your skin by washing with CHG (chlorahexidine gluconate) Soap before surgery.  CHG is an antiseptic cleaner which kills germs and bonds with the skin to continue killing germs even after washing.    Oral Hygiene is also important to reduce your risk of infection.  Remember - BRUSH YOUR TEETH THE MORNING OF SURGERY WITH YOUR REGULAR TOOTHPASTE  Please  do not use if you have an allergy to CHG or antibacterial soaps. If your skin becomes reddened/irritated stop using the CHG.  Do not shave (including legs and underarms) for at least 48 hours prior to first CHG shower. It is OK to shave your face.  Please follow these instructions carefully.   1. Shower the NIGHT BEFORE SURGERY and the MORNING OF SURGERY with CHG Soap.   2. If you chose to wash your hair, wash your hair first as usual with your normal shampoo.  3. After you shampoo, rinse your hair and body thoroughly to remove the shampoo.  4. Use CHG as you would any other liquid soap. You can apply CHG directly to the skin and wash gently with a scrungie or a clean washcloth.   5. Apply the CHG Soap to your body ONLY FROM THE NECK DOWN.  Do not use on  open wounds or open sores. Avoid contact with your eyes, ears, mouth and genitals (private parts). Wash Face and genitals (private parts)  with your normal soap.   6. Wash thoroughly, paying special attention to the area where your surgery will be performed.  7. Thoroughly rinse your body with warm water from the neck down.  8. DO NOT shower/wash with your normal soap after using and rinsing off the CHG Soap.  9. Pat yourself dry with a CLEAN TOWEL.  10. Wear CLEAN PAJAMAS to bed the night before surgery  11. Place CLEAN SHEETS on your bed the night of your first shower and DO NOT SLEEP WITH PETS.   Day of Surgery: Wear Clean/Comfortable clothing the morning of surgery Do not apply any deodorants/lotions.   Remember to brush your teeth WITH YOUR REGULAR TOOTHPASTE.   Please read over the following fact sheets that you were given.

## 2020-01-20 ENCOUNTER — Other Ambulatory Visit: Payer: Self-pay

## 2020-01-20 ENCOUNTER — Encounter (HOSPITAL_COMMUNITY): Payer: Self-pay

## 2020-01-20 ENCOUNTER — Telehealth: Payer: Self-pay

## 2020-01-20 ENCOUNTER — Encounter (HOSPITAL_COMMUNITY)
Admission: RE | Admit: 2020-01-20 | Discharge: 2020-01-20 | Disposition: A | Discharge status: Home / Self Care | Payer: Medicare PPO | Source: Ambulatory Visit | Attending: Surgery | Admitting: Surgery

## 2020-01-20 DIAGNOSIS — Z01812 Encounter for preprocedural laboratory examination: Secondary | ICD-10-CM | POA: Diagnosis not present

## 2020-01-20 LAB — BASIC METABOLIC PANEL
Anion gap: 10 (ref 5–15)
BUN: 20 mg/dL (ref 8–23)
CO2: 27 mmol/L (ref 22–32)
Calcium: 9.4 mg/dL (ref 8.9–10.3)
Chloride: 104 mmol/L (ref 98–111)
Creatinine, Ser: 0.9 mg/dL (ref 0.44–1.00)
GFR, Estimated: >60 mL/min (ref >60)
Glucose, Bld: 108 mg/dL — ABNORMAL HIGH (ref 70–99)
Potassium: 4.2 mmol/L (ref 3.5–5.1)
Sodium: 141 mmol/L (ref 135–145)

## 2020-01-20 LAB — CBC
HCT: 40.9 % (ref 36.0–46.0)
Hemoglobin: 13.3 g/dL (ref 12.0–15.0)
MCH: 31.7 pg (ref 26.0–34.0)
MCHC: 32.5 g/dL (ref 30.0–36.0)
MCV: 97.4 fL (ref 80.0–100.0)
Platelets: 227 10*3/uL (ref 150–400)
RBC: 4.2 MIL/uL (ref 3.87–5.11)
RDW: 12.9 % (ref 11.5–15.5)
WBC: 6.5 10*3/uL (ref 4.0–10.5)
nRBC: 0 % (ref 0.0–0.2)

## 2020-01-20 LAB — GLUCOSE, CAPILLARY: Glucose-Capillary: 128 mg/dL — ABNORMAL HIGH (ref 70–99)

## 2020-01-20 NOTE — Telephone Encounter (Signed)
Spoke with patient.  Patient states she initially called to see if she could restart her vesicare prior to right breast lumpectomy on 01/27/20. States she was concerned that if she had no fluids and voided all night she would be dehydrated. Patient states she has since spoke with pre-op nurse and was advised she can have fluids prior to her surgery. She will not restart Vesicare at this time. Patient will contact the office after surgery is completed and she has recovered to further discuss restarting Vesicare. Patient thankful for call.   Routing to provider for final review. Patient is agreeable to disposition. Will close encounter.

## 2020-01-20 NOTE — Telephone Encounter (Signed)
Patient want to talk to nurse about stopping her medication before breast surgery.

## 2020-01-20 NOTE — Progress Notes (Signed)
PCP - Dr. Leodis Sias Cardiologist - Dr. Lance Muss  PPM/ICD - denies  Chest x-ray - N/A EKG - 07/20/2019 Stress Test - denies ECHO - 10/20/2018 Cardiac Cath - denies  Sleep Study - denies CPAP - N/A  DM: patient stated she is pre-diabetic; CBG at PAT appointment 128  Aspirin and Blood Thinner Instructions: ELIUQIS: patient stated she thinks she was told to stop 2 days to 24 hours prior, unsure. Patient provided with surgeon's office number and instructed to call for verification.  ERAS Protcol - Yes   COVID TEST- Scheduled for 01/25/20. Patient verbalized understanding of self-quarantine instructions, appointment time and place.  Anesthesia review: YES, cardiac hx, seed placement scheduled for 01/26/20 per patient   Patient denies shortness of breath, fever, cough and chest pain at PAT appointment  All instructions explained to the patient, with a verbal understanding of the material. Patient agrees to go over the instructions while at home for a better understanding. Patient also instructed to self quarantine after being tested for COVID-19. The opportunity to ask questions was provided.

## 2020-01-25 ENCOUNTER — Encounter (HOSPITAL_COMMUNITY): Payer: Self-pay

## 2020-01-25 ENCOUNTER — Other Ambulatory Visit (HOSPITAL_COMMUNITY)
Admission: RE | Admit: 2020-01-25 | Discharge: 2020-01-25 | Disposition: A | Discharge status: Home / Self Care | Payer: Medicare PPO | Source: Ambulatory Visit | Attending: Surgery | Admitting: Surgery

## 2020-01-25 DIAGNOSIS — Z01812 Encounter for preprocedural laboratory examination: Secondary | ICD-10-CM | POA: Insufficient documentation

## 2020-01-25 DIAGNOSIS — Z20822 Contact with and (suspected) exposure to covid-19: Secondary | ICD-10-CM | POA: Insufficient documentation

## 2020-01-25 NOTE — Anesthesia Preprocedure Evaluation (Addendum)
Anesthesia Evaluation  Patient identified by MRN, date of birth, ID band Patient awake    Reviewed: Allergy & Precautions, NPO status , Patient's Chart, lab work & pertinent test results  Airway Mallampati: II  TM Distance: >3 FB Neck ROM: Full    Dental  (+) Dental Advisory Given   Pulmonary former smoker,    breath sounds clear to auscultation       Cardiovascular hypertension, Pt. on medications + dysrhythmias Atrial Fibrillation  Rhythm:Regular Rate:Normal     Neuro/Psych    GI/Hepatic Neg liver ROS, hiatal hernia, GERD  ,  Endo/Other  negative endocrine ROS  Renal/GU negative Renal ROS     Musculoskeletal   Abdominal   Peds  Hematology negative hematology ROS (+)   Anesthesia Other Findings   Reproductive/Obstetrics                            Anesthesia Physical Anesthesia Plan  ASA: II  Anesthesia Plan: General   Post-op Pain Management:    Induction: Intravenous  PONV Risk Score and Plan: 3 and Dexamethasone, Ondansetron and Treatment may vary due to age or medical condition  Airway Management Planned: LMA  Additional Equipment:   Intra-op Plan:   Post-operative Plan: Extubation in OR  Informed Consent: I have reviewed the patients History and Physical, chart, labs and discussed the procedure including the risks, benefits and alternatives for the proposed anesthesia with the patient or authorized representative who has indicated his/her understanding and acceptance.     Dental advisory given  Plan Discussed with: CRNA  Anesthesia Plan Comments: (PAT note by Antionette Poles, PA-C: Follows with cardiologist Dr. Eldridge Dace for hx of Afib, maintaining NSR on flecainide, anticoagulated on Eliquis. Last seen 10/13/19, doing well, not changes made to management, advised to followup in 1 year.   At PAT appointment pt couldn't recall if she was instructed to stop Eliquis 1 or 2  days prior to surgery. She was instructed to call surgeon's office for clarification.   Preop labs reviewed, unremarkable.   EKG 07/20/19: NSR. Rate 69. 1st degree block.   TEE 10/20/18: 1. Left ventricular ejection fraction, by visual estimation, is 55 to  60%. The left ventricle has normal function. There is mildly increased  left ventricular hypertrophy.  2. Left ventricular diastolic function could not be evaluated pattern of  LV diastolic filling.  3. Global right ventricle has normal systolic function.The right  ventricular size is normal. No increase in right ventricular wall  thickness.  4. Left atrial size was not assessed.  5. Right atrial size was not assessed.  6. The mitral valve is normal in structure. Mild mitral valve  regurgitation.  7. The tricuspid valve is normal in structure. Tricuspid valve  regurgitation is mild.  8. The aortic valve is tricuspid Aortic valve regurgitation was not  visualized by color flow Doppler.  9. 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  10. The pulmonic valve was normal in structure. Pulmonic valve  regurgitation is trivial by color flow Doppler.  11. Moderate plaque invoving the descending aorta.  12. No thrombus in LA/LAA or RA/RAA. Proceeded to successful  cardioversion.   )       Anesthesia Quick Evaluation

## 2020-01-25 NOTE — Progress Notes (Signed)
Anesthesia Chart Review:  Follows with cardiologist Dr. Eldridge Dace for hx of Afib, maintaining NSR on flecainide, anticoagulated on Eliquis. Last seen 10/13/19, doing well, not changes made to management, advised to followup in 1 year.   At PAT appointment pt couldn't recall if she was instructed to stop Eliquis 1 or 2 days prior to surgery. She was instructed to call surgeon's office for clarification.   Preop labs reviewed, unremarkable.   EKG 07/20/19: NSR. Rate 69. 1st degree block.   TEE 10/20/18: 1. Left ventricular ejection fraction, by visual estimation, is 55 to  60%. The left ventricle has normal function. There is mildly increased  left ventricular hypertrophy.  2. Left ventricular diastolic function could not be evaluated pattern of  LV diastolic filling.  3. Global right ventricle has normal systolic function.The right  ventricular size is normal. No increase in right ventricular wall  thickness.  4. Left atrial size was not assessed.  5. Right atrial size was not assessed.  6. The mitral valve is normal in structure. Mild mitral valve  regurgitation.  7. The tricuspid valve is normal in structure. Tricuspid valve  regurgitation is mild.  8. The aortic valve is tricuspid Aortic valve regurgitation was not  visualized by color flow Doppler.  9. 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  10. The pulmonic valve was normal in structure. Pulmonic valve  regurgitation is trivial by color flow Doppler.  11. Moderate plaque invoving the descending aorta.  12. No thrombus in LA/LAA or RA/RAA. Proceeded to successful  cardioversion.    Zannie Cove [facility] Short Stay Center/Anesthesiology Phone [phone] 01/25/2020 11:45 AM

## 2020-01-26 DIAGNOSIS — D0511 Intraductal carcinoma in situ of right breast: Secondary | ICD-10-CM | POA: Diagnosis not present

## 2020-01-26 LAB — SARS CORONAVIRUS 2 (TAT 6-24 HRS): SARS Coronavirus 2: NEGATIVE

## 2020-01-26 NOTE — H&P (Signed)
Anna Mathews  Location: [facility] Patient #: [id number] DOB: 11-Oct-1940 Undefined / Language: [place] / Race: White Female   History of Present Illness  The patient is a 80  year old female who presents with breast cancer.  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 She had a small 0.4 cm mass seen in the right breast on mammography. She underwent a biopsy of this showing high-grade ductal carcinoma in situ. It was 95% ER positive and 40% PR positive. She has had previous breast surgery on the other breast for position changes as well as dilated ducts. There is a history of multiple malignancies in the family. She denies nipple discharge. She is otherwise without complaints. She is on Eliquis for history of A. fib but is currently rate controlled. She recently saw her cardiologist in September and has been doing well.    Past Surgical History Reinaldo Meeker, RN;  Cesarean Section - Multiple   Diagnostic Studies History Reinaldo Meeker, RN;  Colonoscopy  1-5 years ago Mammogram  within last year Pap Smear  1-5 years ago  Medication History Reinaldo Meeker, RN;  Medications Reconciled  Social History Reinaldo Meeker, RN; Alcohol use  Occasional alcohol use. No drug use  Tobacco use  Former smoker.  Family History Reinaldo Meeker, RN Breast Cancer  Family Members In General. Cerebrovascular Accident  Family Members In General. Diabetes Mellitus  Family Members In General. Respiratory Condition  Father.  Pregnancy / Birth History Reinaldo Meeker, RN;  Age at menarche  9 years. Age of menopause  13-50 Contraceptive History  Oral contraceptives. Gravida  2 Length (months) of breastfeeding  7-12 Maternal age  15-30 Para  2 Regular periods   Other Problems Reinaldo Meeker, RN; Atrial Fibrillation  Gastroesophageal Reflux Disease  Hemorrhoids   High blood pressure  Hypercholesterolemia     Review of Systems Reinaldo Meeker RN;  General Not Present- Appetite Loss, Chills, Fatigue, Fever, Night Sweats, Weight Gain and Weight Loss. Skin Not Present- Change in Wart/Mole, Dryness, Hives, Jaundice, New Lesions, Non-Healing Wounds, Rash and Ulcer. HEENT Present- Wears glasses/contact lenses. Not Present- Earache, Hearing Loss, Hoarseness, Nose Bleed, Oral Ulcers, Ringing in the Ears, Seasonal Allergies, Sinus Pain, Sore Throat, Visual Disturbances and Yellow Eyes. Respiratory Not Present- Bloody sputum, Chronic Cough, Difficulty Breathing, Snoring and Wheezing. Breast Not Present- Breast Mass, Breast Pain, Nipple Discharge and Skin Changes. Cardiovascular Not Present- Chest Pain, Difficulty Breathing Lying Down, Leg Cramps, Palpitations, Rapid Heart Rate, Shortness of Breath and Swelling of Extremities. Gastrointestinal Present- Excessive gas and Hemorrhoids. Not Present- Abdominal Pain, Bloating, Bloody Stool, Change in Bowel Habits, Chronic diarrhea, Constipation, Difficulty Swallowing, Gets full quickly at meals, Indigestion, Nausea, Rectal Pain and Vomiting. Female Genitourinary Present- Nocturia. Not Present- Frequency, Painful Urination, Pelvic Pain and Urgency. Musculoskeletal Present- Muscle Weakness. Not Present- Back Pain, Joint Pain, Joint Stiffness, Muscle Pain and Swelling of Extremities. Psychiatric Present- Depression. Not Present- Anxiety, Bipolar, Change in Sleep Pattern, Fearful and Frequent crying. Endocrine Present- Hair Changes. Not Present- Cold Intolerance, Excessive Hunger, Heat Intolerance, Hot flashes and New Diabetes. Hematology Present- Blood Thinners. Not Present- Easy Bruising, Excessive bleeding, Gland problems, HIV and Persistent Infections.   Physical Exam  The physical exam findings are as follows: Note: She appears well on exam  On the right breast, there is moderate ecchymosis and hematoma from the  biopsy. The nipple areolar complex is normal. There is  no axillary adenopathy    Assessment & Plan  DUCTAL CARCINOMA IN SITU (DCIS) OF RIGHT BREAST (D05.11)  Impression: I have reviewed her notes in the electronic medical records. I have also reviewed her mammogram, ultrasound, and biopsy results. We also discussed her today in our multidisciplinary breast cancer conference. 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 I discussed this with the patient and her daughter. We discussed multiple treatments for breast cancer. From a surgical standpoint we discussed lumpectomy versus mastectomy. This area is so small, we would recommend a radioactive seed guided lumpectomy. I discussed this with them in detail and they would like to proceed with the surgery. I discussed the risk of the surgery. These include but are not limited to bleeding, infection, injury to surrounding structures, the need for further surgery if margins are positive, cardiopulmonary issues, postoperative recovery, etc. They understand and wish to proceed with surgery which will be scheduled. She will stop her Eliquis for 2 days preoperatively.

## 2020-01-27 ENCOUNTER — Encounter (HOSPITAL_COMMUNITY)
Admission: RE | Disposition: A | Discharge status: Home / Self Care | Payer: Self-pay | Source: Home / Self Care | Attending: Surgery

## 2020-01-27 ENCOUNTER — Ambulatory Visit (HOSPITAL_COMMUNITY)
Admission: RE | Admit: 2020-01-27 | Discharge: 2020-01-27 | Disposition: A | Discharge status: Home / Self Care | Payer: Medicare PPO | Attending: Surgery | Admitting: Surgery

## 2020-01-27 ENCOUNTER — Encounter (HOSPITAL_COMMUNITY): Payer: Self-pay | Admitting: Surgery

## 2020-01-27 ENCOUNTER — Ambulatory Visit (HOSPITAL_COMMUNITY): Payer: Medicare PPO | Admitting: Physician Assistant

## 2020-01-27 ENCOUNTER — Other Ambulatory Visit: Payer: Self-pay

## 2020-01-27 ENCOUNTER — Ambulatory Visit (HOSPITAL_COMMUNITY): Payer: Medicare PPO | Admitting: Anesthesiology

## 2020-01-27 DIAGNOSIS — Z87891 Personal history of nicotine dependence: Secondary | ICD-10-CM | POA: Diagnosis not present

## 2020-01-27 DIAGNOSIS — D0511 Intraductal carcinoma in situ of right breast: Secondary | ICD-10-CM | POA: Diagnosis not present

## 2020-01-27 DIAGNOSIS — N6021 Fibroadenosis of right breast: Secondary | ICD-10-CM | POA: Diagnosis not present

## 2020-01-27 DIAGNOSIS — Z7901 Long term (current) use of anticoagulants: Secondary | ICD-10-CM | POA: Diagnosis not present

## 2020-01-27 DIAGNOSIS — Z833 Family history of diabetes mellitus: Secondary | ICD-10-CM | POA: Insufficient documentation

## 2020-01-27 DIAGNOSIS — E559 Vitamin D deficiency, unspecified: Secondary | ICD-10-CM | POA: Diagnosis not present

## 2020-01-27 DIAGNOSIS — Z803 Family history of malignant neoplasm of breast: Secondary | ICD-10-CM | POA: Insufficient documentation

## 2020-01-27 DIAGNOSIS — I484 Atypical atrial flutter: Secondary | ICD-10-CM | POA: Diagnosis not present

## 2020-01-27 DIAGNOSIS — D241 Benign neoplasm of right breast: Secondary | ICD-10-CM | POA: Insufficient documentation

## 2020-01-27 DIAGNOSIS — C50911 Malignant neoplasm of unspecified site of right female breast: Secondary | ICD-10-CM | POA: Diagnosis not present

## 2020-01-27 DIAGNOSIS — N6489 Other specified disorders of breast: Secondary | ICD-10-CM | POA: Diagnosis not present

## 2020-01-27 DIAGNOSIS — N6011 Diffuse cystic mastopathy of right breast: Secondary | ICD-10-CM | POA: Insufficient documentation

## 2020-01-27 DIAGNOSIS — N6091 Unspecified benign mammary dysplasia of right breast: Secondary | ICD-10-CM | POA: Diagnosis not present

## 2020-01-27 DIAGNOSIS — I480 Paroxysmal atrial fibrillation: Secondary | ICD-10-CM | POA: Diagnosis not present

## 2020-01-27 HISTORY — PX: BREAST LUMPECTOMY WITH RADIOACTIVE SEED LOCALIZATION: SHX6424

## 2020-01-27 SURGERY — BREAST LUMPECTOMY WITH RADIOACTIVE SEED LOCALIZATION
Anesthesia: General | Site: Breast | Laterality: Right

## 2020-01-27 MED ORDER — CHLORHEXIDINE GLUCONATE 0.12 % MT SOLN
OROMUCOSAL | Status: AC
  Administered 2020-01-27: 15 mL via OROMUCOSAL
  Filled 2020-01-27: qty 15

## 2020-01-27 MED ORDER — CHLORHEXIDINE GLUCONATE 0.12 % MT SOLN: 15.0000 mL | Freq: Once | OROMUCOSAL | Status: AC

## 2020-01-27 MED ORDER — ORAL CARE MOUTH RINSE: 15.0000 mL | Freq: Once | OROMUCOSAL | Status: AC

## 2020-01-27 MED ORDER — FENTANYL CITRATE (PF) 250 MCG/5ML IJ SOLN
INTRAMUSCULAR | Status: AC
  Filled 2020-01-27: qty 5

## 2020-01-27 MED ORDER — DEXAMETHASONE SODIUM PHOSPHATE 10 MG/ML IJ SOLN
INTRAMUSCULAR | Status: DC | PRN
  Administered 2020-01-27: 4 mg via INTRAVENOUS

## 2020-01-27 MED ORDER — 0.9 % SODIUM CHLORIDE (POUR BTL) OPTIME
TOPICAL | Status: DC | PRN
  Administered 2020-01-27: 1000 mL

## 2020-01-27 MED ORDER — CHLORHEXIDINE GLUCONATE CLOTH 2 % EX PADS: 6.0000 | MEDICATED_PAD | Freq: Once | CUTANEOUS | Status: DC

## 2020-01-27 MED ORDER — ENSURE PRE-SURGERY PO LIQD: 296.0000 mL | Freq: Once | ORAL | Status: DC

## 2020-01-27 MED ORDER — PROPOFOL 10 MG/ML IV BOLUS
INTRAVENOUS | Status: DC | PRN
  Administered 2020-01-27: 50 mg via INTRAVENOUS
  Administered 2020-01-27: 150 mg via INTRAVENOUS

## 2020-01-27 MED ORDER — TRAMADOL HCL 50 MG PO TABS: 50.0000 mg | ORAL_TABLET | Freq: Four times a day (QID) | ORAL | 0 refills | Status: DC | PRN

## 2020-01-27 MED ORDER — LACTATED RINGERS IV SOLN: INTRAVENOUS | Status: DC

## 2020-01-27 MED ORDER — GABAPENTIN 100 MG PO CAPS: 100.0000 mg | ORAL_CAPSULE | ORAL | Status: AC

## 2020-01-27 MED ORDER — ACETAMINOPHEN 500 MG PO TABS: 1000.0000 mg | ORAL_TABLET | ORAL | Status: AC

## 2020-01-27 MED ORDER — EPHEDRINE SULFATE 50 MG/ML IJ SOLN
INTRAMUSCULAR | Status: DC | PRN
  Administered 2020-01-27 (×2): 5 mg via INTRAVENOUS

## 2020-01-27 MED ORDER — GABAPENTIN 100 MG PO CAPS
ORAL_CAPSULE | ORAL | Status: AC
  Administered 2020-01-27: 100 mg via ORAL
  Filled 2020-01-27: qty 1

## 2020-01-27 MED ORDER — PROPOFOL 10 MG/ML IV BOLUS
INTRAVENOUS | Status: AC
  Filled 2020-01-27: qty 20

## 2020-01-27 MED ORDER — BUPIVACAINE HCL (PF) 0.25 % IJ SOLN
INTRAMUSCULAR | Status: AC
  Filled 2020-01-27: qty 30

## 2020-01-27 MED ORDER — BUPIVACAINE HCL (PF) 0.25 % IJ SOLN
INTRAMUSCULAR | Status: DC | PRN
  Administered 2020-01-27: 10 mL

## 2020-01-27 MED ORDER — CEFAZOLIN SODIUM-DEXTROSE 2-4 GM/100ML-% IV SOLN
INTRAVENOUS | Status: AC
  Filled 2020-01-27: qty 100

## 2020-01-27 MED ORDER — LIDOCAINE 2% (20 MG/ML) 5 ML SYRINGE
INTRAMUSCULAR | Status: DC | PRN
  Administered 2020-01-27: 60 mg via INTRAVENOUS

## 2020-01-27 MED ORDER — ACETAMINOPHEN 500 MG PO TABS
ORAL_TABLET | ORAL | Status: AC
  Administered 2020-01-27: 1000 mg via ORAL
  Filled 2020-01-27: qty 2

## 2020-01-27 MED ORDER — ONDANSETRON HCL 4 MG/2ML IJ SOLN
INTRAMUSCULAR | Status: DC | PRN
  Administered 2020-01-27: 4 mg via INTRAVENOUS

## 2020-01-27 MED ORDER — CEFAZOLIN SODIUM-DEXTROSE 2-4 GM/100ML-% IV SOLN
2.0000 g | INTRAVENOUS | Status: AC
  Administered 2020-01-27: 2 g via INTRAVENOUS

## 2020-01-27 SURGICAL SUPPLY — 31 items
APPLIER CLIP 9.375 MED OPEN (MISCELLANEOUS) ×2
BINDER BREAST LRG (GAUZE/BANDAGES/DRESSINGS) IMPLANT
BINDER BREAST XLRG (GAUZE/BANDAGES/DRESSINGS) ×2 IMPLANT
CANISTER SUCT 3000ML PPV (MISCELLANEOUS) ×2 IMPLANT
CHLORAPREP W/TINT 26 (MISCELLANEOUS) ×2 IMPLANT
CLIP APPLIE 9.375 MED OPEN (MISCELLANEOUS) ×1 IMPLANT
COVER PROBE W GEL 5X96 (DRAPES) ×2 IMPLANT
COVER SURGICAL LIGHT HANDLE (MISCELLANEOUS) ×2 IMPLANT
COVER WAND RF STERILE (DRAPES) ×2 IMPLANT
DERMABOND ADVANCED (GAUZE/BANDAGES/DRESSINGS) ×1
DERMABOND ADVANCED .7 DNX12 (GAUZE/BANDAGES/DRESSINGS) ×1 IMPLANT
DEVICE DUBIN SPECIMEN MAMMOGRA (MISCELLANEOUS) ×2 IMPLANT
DRAPE CHEST BREAST 15X10 FENES (DRAPES) ×2 IMPLANT
ELECT CAUTERY BLADE 6.4 (BLADE) ×2 IMPLANT
ELECT REM PT RETURN 9FT ADLT (ELECTROSURGICAL) ×2
ELECTRODE REM PT RTRN 9FT ADLT (ELECTROSURGICAL) ×1 IMPLANT
GLOVE SURG SIGNA 7.5 PF LTX (GLOVE) ×2 IMPLANT
GOWN STRL REUS W/ TWL LRG LVL3 (GOWN DISPOSABLE) ×1 IMPLANT
GOWN STRL REUS W/ TWL XL LVL3 (GOWN DISPOSABLE) ×1 IMPLANT
GOWN STRL REUS W/TWL LRG LVL3 (GOWN DISPOSABLE) ×2
GOWN STRL REUS W/TWL XL LVL3 (GOWN DISPOSABLE) ×2
KIT BASIN OR (CUSTOM PROCEDURE TRAY) ×2 IMPLANT
KIT MARKER MARGIN INK (KITS) ×2 IMPLANT
NEEDLE HYPO 25GX1X1/2 BEV (NEEDLE) ×2 IMPLANT
NS IRRIG 1000ML POUR BTL (IV SOLUTION) IMPLANT
PACK GENERAL/GYN (CUSTOM PROCEDURE TRAY) ×2 IMPLANT
SUT MNCRL AB 4-0 PS2 18 (SUTURE) ×2 IMPLANT
SUT VIC AB 3-0 SH 18 (SUTURE) ×2 IMPLANT
SYR CONTROL 10ML LL (SYRINGE) ×2 IMPLANT
TOWEL GREEN STERILE (TOWEL DISPOSABLE) ×2 IMPLANT
TOWEL GREEN STERILE FF (TOWEL DISPOSABLE) ×2 IMPLANT

## 2020-01-27 NOTE — Op Note (Signed)
RIGHT BREAST LUMPECTOMY WITH RADIOACTIVE SEED LOCALIZATION  Procedure Note  Anna Mathews 01/27/2020   Pre-op Diagnosis: RIGHT BREAST DUCTAL CARCINOMA IN SITU     Post-op Diagnosis: same  Procedure(s): RIGHT BREAST LUMPECTOMY WITH RADIOACTIVE SEED LOCALIZATION  Surgeon(s): Abigail Miyamoto, MD  Anesthesia: General  Staff:  Circulator: Rogers Seeds, RN Relief Scrub: Luvenia Starch, EMT Scrub Person: Teschner, Dyke Brackett, CST Circulator Assistant: Larita Fife, RN  Estimated Blood Loss: Minimal               Specimens: sent to path  Indications: This is an 80 year old female who was found have a small area of calcifications in the right breast on screening mammography.  She underwent a biopsy of the showing ductal carcinoma in situ.  The decision was made to proceed with a radioactive seed guided right breast lumpectomy  Procedure: The patient was brought to the operating identifies correct patient.  She is placed upon the operating table general esthesia was induced.  Right breast was prepped and draped in usual sterile fashion.  The radioactive seed was located at the 12:30 position of the right breast just adjacent to the areola.  I elected to make an elliptical incision to include the indented skin from the previous biopsy and the incision.  I then took this down to the breast tissue with electrocautery.  I then performed a lumpectomy staying around the radioactive seed with the aid of electrocautery going down to the chest wall.  The lumpectomy specimen was then completely removed with the cautery.  I marked all margins of the paint.  The specimen was an x-ray confirmed the radiologist previous tissue marker in the center of the specimen.  The lumpectomy specimen was then sent to pathology for evaluation.  Achieving status with cautery.  I anesthetized the incision for with Marcaine.  I then placed surgical clips around the periphery of the biopsy cavity.  Subcutaneous tissue  closed with 3-0 Vicryl sutures and skin was closed with a running 4-0 Monocryl.  Dermabond was then applied.  The patient was then next placed a breast binder.  She tolerated the procedure well.  All the counts were correct at the end of the procedure.  She was then extubated in the operating room and taken in a stable condition to the recovery room.          Abigail Miyamoto   Date: 01/27/2020  Time: 11:58 AM

## 2020-01-27 NOTE — Anesthesia Procedure Notes (Signed)
Procedure Name: LMA Insertion Date/Time: 01/27/2020 11:24 AM Performed by: Jed Limerick, CRNA Pre-anesthesia Checklist: Patient identified, Emergency Drugs available, Suction available and Patient being monitored Patient Re-evaluated:Patient Re-evaluated prior to induction Oxygen Delivery Method: Circle System Utilized Preoxygenation: Pre-oxygenation with 100% oxygen Induction Type: IV induction Ventilation: Mask ventilation without difficulty LMA: LMA inserted LMA Size: 3.0 Number of attempts: 1 Placement Confirmation: positive ETCO2 Tube secured with: Tape Dental Injury: Teeth and Oropharynx as per pre-operative assessment

## 2020-01-27 NOTE — Discharge Instructions (Signed)
[facility] Office Phone Number [phone]  BREAST BIOPSY/ PARTIAL MASTECTOMY: POST OP INSTRUCTIONS  Always review your discharge instruction sheet given to you by the facility where your surgery was performed.  IF YOU HAVE DISABILITY OR FAMILY LEAVE FORMS, YOU MUST BRING THEM TO THE OFFICE FOR PROCESSING.  DO NOT GIVE THEM TO YOUR DOCTOR.  1. A prescription for pain medication may be given to you upon discharge.  Take your pain medication as prescribed, if needed.  If narcotic pain medicine is not needed, then you may take acetaminophen (Tylenol) or ibuprofen (Advil) as needed. 2. Take your usually prescribed medications unless otherwise directed 3. If you need a refill on your pain medication, please contact your pharmacy.  They will contact our office to request authorization.  Prescriptions will not be filled after 5pm or on week-ends. 4. You should eat very light the first 24 hours after surgery, such as soup, crackers, pudding, etc.  Resume your normal diet the day after surgery. 5. Most patients will experience some swelling and bruising in the breast.  Ice packs and a good support bra will help.  Swelling and bruising can take several days to resolve.  6. It is common to experience some constipation if taking pain medication after surgery.  Increasing fluid intake and taking a stool softener will usually help or prevent this problem from occurring.  A mild laxative (Milk of Magnesia or Miralax) should be taken according to package directions if there are no bowel movements after 48 hours. 7. Unless discharge instructions indicate otherwise, you may remove your bandages 24-48 hours after surgery, and you may shower at that time.  You may have steri-strips (small skin tapes) in place directly over the incision.  These strips should be left on the skin for 7-10 days.  If your surgeon used skin glue on the incision, you may shower in 24 hours.  The glue will flake off over the  next 2-3 weeks.  Any sutures or staples will be removed at the office during your follow-up visit. 8. ACTIVITIES:  You may resume regular daily activities (gradually increasing) beginning the next day.  Wearing a good support bra or sports bra minimizes pain and swelling.  You may have sexual intercourse when it is comfortable. a. You may drive when you no longer are taking prescription pain medication, you can comfortably wear a seatbelt, and you can safely maneuver your car and apply brakes. b. RETURN TO WORK:  ______________________________________________________________________________________ 9. You should see your doctor in the office for a follow-up appointment approximately two weeks after your surgery.  Your doctor's nurse will typically make your follow-up appointment when she calls you with your pathology report.  Expect your pathology report 2-3 business days after your surgery.  You may call to check if you do not hear from us after three days. 10. OTHER INSTRUCTIONS:OK TO REMOVE BINDER AND SHOWER STARTING TOMORROW 11. ICE PACK, TYLENOL, AND IBUPROFEN ALSO FOR PAIN 12. NO VIGOROUS ACTIVITY FOR ONE WEEK _______________________________________________________________________________________________ _____________________________________________________________________________________________________________________________________ _____________________________________________________________________________________________________________________________________ _____________________________________________________________________________________________________________________________________  WHEN TO CALL YOUR DOCTOR: 1. Fever over 101.0 2. Nausea and/or vomiting. 3. Extreme swelling or bruising. 4. Continued bleeding from incision. 5. Increased pain, redness, or drainage from the incision.  The clinic staff is available to answer your questions during regular business hours.  Please  don't hesitate to call and ask to speak to one of the nurses for clinical concerns.  If you have a medical emergency, go to the nearest emergency room or call   call 911.  A surgeon from [facility] is always on call at the hospital. ° °For further questions, please visit [email or web address]  °

## 2020-01-27 NOTE — Interval H&P Note (Signed)
History and Physical Interval Note:no change in H and P  01/27/2020 9:57 AM  Anna Mathews  has presented today for surgery, with the diagnosis of RIGHT BREAST DUCTAL CARCINOMA IN SITU.  The various methods of treatment have been discussed with the patient and family. After consideration of risks, benefits and other options for treatment, the patient has consented to  Procedure(s): RIGHT BREAST LUMPECTOMY WITH RADIOACTIVE SEED LOCALIZATION (Right) as a surgical intervention.  [name] history has been reviewed, patient examined, no change in status, stable for surgery.  I have reviewed the patient's chart and labs.  Questions were answered to the patient's satisfaction.     Abigail Miyamoto

## 2020-01-27 NOTE — Anesthesia Postprocedure Evaluation (Signed)
Anesthesia Post Note  Patient: Anna Mathews  Procedure(s) Performed: RIGHT BREAST LUMPECTOMY WITH RADIOACTIVE SEED LOCALIZATION (Right Breast)     Patient location during evaluation: PACU Anesthesia Type: General Level of consciousness: awake and alert Pain management: pain level controlled Vital Signs Assessment: post-procedure vital signs reviewed and stable Respiratory status: spontaneous breathing, nonlabored ventilation, respiratory function stable and patient connected to nasal cannula oxygen Cardiovascular status: blood pressure returned to baseline and stable Postop Assessment: no apparent nausea or vomiting Anesthetic complications: no   No complications documented.  Last Vitals:  Vitals:   01/27/20 1236 01/27/20 1245  BP: (!) 141/93 (!) 157/49  Pulse: (!) 51 (!) 52  Resp: 17 17  Temp:  (!) 36.1 C  SpO2: 98% 97%    Last Pain:  Vitals:   01/27/20 1245  TempSrc:   PainSc: 0-No pain                 Kennieth Rad

## 2020-01-27 NOTE — Transfer of Care (Signed)
Immediate Anesthesia Transfer of Care Note  Patient: Anna Mathews  Procedure(s) Performed: RIGHT BREAST LUMPECTOMY WITH RADIOACTIVE SEED LOCALIZATION (Right Breast)  Patient Location: PACU  Anesthesia Type:General  Level of Consciousness: drowsy  Airway & Oxygen Therapy: Patient Spontanous Breathing  Post-op Assessment: Report given to RN and Post -op Vital signs reviewed and stable  Post vital signs: Reviewed and stable  Last Vitals:  Vitals Value Taken Time  BP 156/63 01/27/20 1205  Temp    Pulse 59 01/27/20 1207  Resp 15 01/27/20 1207  SpO2 97 % 01/27/20 1207  Vitals shown include unvalidated device data.  Last Pain:  Vitals:   01/27/20 0953  TempSrc:   PainSc: 0-No pain         Complications: No complications documented.

## 2020-01-28 ENCOUNTER — Encounter (HOSPITAL_COMMUNITY): Payer: Self-pay | Admitting: Surgery

## 2020-01-29 LAB — SURGICAL PATHOLOGY

## 2020-02-01 ENCOUNTER — Encounter: Payer: Self-pay | Admitting: *Deleted

## 2020-02-25 ENCOUNTER — Other Ambulatory Visit: Payer: Self-pay

## 2020-02-25 ENCOUNTER — Ambulatory Visit
Admission: RE | Admit: 2020-02-25 | Discharge: 2020-02-25 | Disposition: A | Discharge status: Home / Self Care | Payer: Medicare PPO | Source: Ambulatory Visit | Attending: Radiation Oncology | Admitting: Radiation Oncology

## 2020-02-25 ENCOUNTER — Encounter: Payer: Self-pay | Admitting: Radiation Oncology

## 2020-02-25 VITALS — BP 143/60 | HR 64 | Temp 98.0°F | Resp 18 | Wt 141.2 lb

## 2020-02-25 DIAGNOSIS — D0511 Intraductal carcinoma in situ of right breast: Secondary | ICD-10-CM

## 2020-02-25 DIAGNOSIS — Z170 Estrogen receptor positive status [ER+]: Secondary | ICD-10-CM | POA: Insufficient documentation

## 2020-02-25 DIAGNOSIS — Z803 Family history of malignant neoplasm of breast: Secondary | ICD-10-CM | POA: Diagnosis not present

## 2020-02-25 DIAGNOSIS — Z510 Encounter for antineoplastic radiation therapy: Secondary | ICD-10-CM | POA: Diagnosis not present

## 2020-02-25 DIAGNOSIS — Z79899 Other long term (current) drug therapy: Secondary | ICD-10-CM | POA: Insufficient documentation

## 2020-02-25 DIAGNOSIS — I4891 Unspecified atrial fibrillation: Secondary | ICD-10-CM | POA: Insufficient documentation

## 2020-02-25 DIAGNOSIS — Z7901 Long term (current) use of anticoagulants: Secondary | ICD-10-CM | POA: Diagnosis not present

## 2020-02-25 DIAGNOSIS — M8580 Other specified disorders of bone density and structure, unspecified site: Secondary | ICD-10-CM | POA: Insufficient documentation

## 2020-02-25 DIAGNOSIS — I10 Essential (primary) hypertension: Secondary | ICD-10-CM | POA: Insufficient documentation

## 2020-02-25 DIAGNOSIS — K219 Gastro-esophageal reflux disease without esophagitis: Secondary | ICD-10-CM | POA: Diagnosis not present

## 2020-02-25 DIAGNOSIS — E785 Hyperlipidemia, unspecified: Secondary | ICD-10-CM | POA: Diagnosis not present

## 2020-02-25 DIAGNOSIS — K449 Diaphragmatic hernia without obstruction or gangrene: Secondary | ICD-10-CM | POA: Insufficient documentation

## 2020-02-25 DIAGNOSIS — Z7982 Long term (current) use of aspirin: Secondary | ICD-10-CM | POA: Diagnosis not present

## 2020-02-25 DIAGNOSIS — Z87891 Personal history of nicotine dependence: Secondary | ICD-10-CM | POA: Insufficient documentation

## 2020-02-25 NOTE — Progress Notes (Signed)
Radiation Oncology         [phone] ________________________________  Name: Anna Mathews        MRN: [id number]  Date of Service: 02/25/2020 DOB: May 01, 1940  JO:ACZY, Edwyna Shell, MD  Vernie Shanks, MD     REFERRING PHYSICIAN: Vernie Shanks, MD   DIAGNOSIS: The encounter diagnosis was Ductal carcinoma in situ (DCIS) of right breast.   HISTORY OF PRESENT ILLNESS: Anna Mathews is a 80 y.o. female originally seen in the multidisciplinary breast clinic for a new diagnosis of right breast cancer. The patient was noted to have a screening detected grouping of calcifications in the right breast.  Diagnostic imaging revealed this grouping spanning a total of 4 mm in the upper inner quadrant.  She underwent a biopsy of these on 02/14/2019 which revealed a high-grade DCIS that was ER/PR positive.  She subsequently underwent right lumpectomy on 01/27/2020, final pathology revealed a complex sclerosing lesion with intraductal papilloma fibrocystic change sclerosis adenosis and usual ductal hyperplasia extensive biopsy site changes and no malignancy.  No residual DCIS was identified.  Her original size of DCIS which was measured from the biopsy was 2 mm.  She is seen today to discuss treatment recommendations and possible adjuvant therapy.    PREVIOUS RADIATION THERAPY: No   PAST MEDICAL HISTORY:  Past Medical History:  Diagnosis Date  . Atrial fibrillation ([place]) 09/2009  . Breast cancer ([place])   . Diverticulosis 09/2006  . GERD (gastroesophageal reflux disease)   . Hiatal hernia   . Hyperlipidemia   . Hypertension   . Osteopenia   . Postmenopausal hormone replacement therapy 1989 - 07/2000       PAST SURGICAL HISTORY: Past Surgical History:  Procedure Laterality Date  . 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 BREAST LUMPECTOMY WITH RADIOACTIVE  SEED LOCALIZATION Right 01/27/2020   Procedure: RIGHT BREAST LUMPECTOMY WITH RADIOACTIVE SEED LOCALIZATION;  Surgeon: Coralie Keens, MD;  Location: [facility];  Service: General;  Laterality: Right;  . BREAST SURGERY Left 4/99   breast biopsy - fibrosis  . CARDIOVERSION N/A 07/16/2017   Procedure: CARDIOVERSION;  Surgeon: Jerline Pain, MD;  Location: [facility] ENDOSCOPY;  Service: Cardiovascular;  Laterality: N/A;  . CARDIOVERSION N/A 10/20/2018   Procedure: CARDIOVERSION;  Surgeon: Buford Dresser, MD;  Location: [facility];  Service: Cardiovascular;  Laterality: N/A;  . CATARACT EXTRACTION W/ INTRAOCULAR LENS IMPLANT Right   . CESAREAN SECTION     X 2  . HYSTEROSCOPY  4/95   w/D&C  . TEE WITHOUT CARDIOVERSION N/A 10/20/2018   Procedure: TRANSESOPHAGEAL ECHOCARDIOGRAM (TEE);  Surgeon: Buford Dresser, MD;  Location: [facility] ENDOSCOPY;  Service: Cardiovascular;  Laterality: N/A;     FAMILY HISTORY:  Family History  Problem Relation Age of Onset  . Hypertension Mother   . Heart disease Father   . COPD Father   . Diabetes Maternal Grandfather   . Diabetes Son   . Rheum arthritis Brother   . Kidney cancer Brother   . Breast cancer Maternal Grandmother   . Diabetes Maternal Aunt   . Diabetes Maternal Aunt      SOCIAL HISTORY:  reports that she quit smoking about 52 years ago. Her smoking use included cigarettes. She started smoking about 63 years ago. She has a 3.00 pack-year smoking history. She has never used smokeless tobacco. She reports  current alcohol use. She reports that she does not use drugs.  The patient is widowed and resides in [place]. She is very active.     ALLERGIES: Penicillins, Crab [shellfish allergy], and Sulfa antibiotics   MEDICATIONS:  Current Outpatient Medications  Medication Sig Dispense Refill  . acetaminophen (TYLENOL) 500 MG tablet Take 500-1,000 mg by mouth every 6 (six) hours as needed (pain.).    Marland Kitchen amLODipine (NORVASC) 5 MG tablet TAKE 1  TABLET BY MOUTH EVERY DAY (Patient taking differently: Take 5 mg by mouth daily.) 90 tablet 3  . aspirin 81 MG chewable tablet Chew 81 mg by mouth daily.    . Biotin 5000 MCG TABS Take 5,000 mcg by mouth daily.    . Calcium-Magnesium-Vitamin D (CALCIUM 1200+D3 PO) Take 1 tablet by mouth in the morning and at bedtime.    . Camphor-Menthol-Methyl Sal (SALONPAS) 3.01-28-08 % PTCH Place 1 patch onto the skin daily as needed (knee/back pain.).    Marland Kitchen ELIQUIS 5 MG TABS tablet TAKE 1 TABLET BY MOUTH TWICE A DAY (Patient taking differently: Take 5 mg by mouth 2 (two) times daily.) 60 tablet 5  . flecainide (TAMBOCOR) 150 MG tablet TAKE 1 TABLET BY MOUTH TWICE A DAY (Patient taking differently: Take 150 mg by mouth 2 (two) times daily.) 180 tablet 3  . Omega-3 Fatty Acids (FISH OIL) 1200 MG CAPS Take 1,200 mg by mouth in the morning and at bedtime.    Vladimir Faster Glycol-Propyl Glycol (LUBRICANT EYE DROPS) 0.4-0.3 % SOLN Place 1 drop into both eyes 3 (three) times daily as needed (dry/irritated eyes).    . PROLIA 60 MG/ML SOSY injection INJECT 60MG  SUBCUTANEOUSLY  INTO UPPER ARM, THIGH, OR  ABDOMEN EVERY 6 MONTHS  (GIVEN AT PRESCRIBERS  OFFICE) (Patient taking differently: Inject 60 mg into the skin every 6 (six) months.) 1 mL 1  . quinapril (ACCUPRIL) 40 MG tablet Take 40 mg by mouth daily.     . simvastatin (ZOCOR) 20 MG tablet Take 20 mg by mouth every evening.     . solifenacin (VESICARE) 10 MG tablet Take 10 mg by mouth daily.    . traMADol (ULTRAM) 50 MG tablet Take 1 tablet (50 mg total) by mouth every 6 (six) hours as needed for moderate pain or severe pain. 20 tablet 0   No current facility-administered medications for this encounter.     REVIEW OF SYSTEMS: On review of systems, the patient reports that she is doing well since surgery. She denies any specific concerns about edema or pain of her breast. No other complaints are verbalized.     PHYSICAL EXAM:  Wt Readings from Last 3 Encounters:   02/25/20 141 lb 4 oz (64.1 kg)  01/27/20 142 lb 3.2 oz (64.5 kg)  01/20/20 142 lb 1.6 oz (64.5 kg)   Temp Readings from Last 3 Encounters:  02/25/20 98 F (36.7 C) (Oral)  01/27/20 (!) 97 F (36.1 C)  01/20/20 97.9 F (36.6 C) (Oral)   BP Readings from Last 3 Encounters:  02/25/20 (!) 143/60  01/27/20 (!) 157/49  01/20/20 (!) 159/55   Pulse Readings from Last 3 Encounters:  02/25/20 64  01/27/20 (!) 52  01/20/20 69   In general this is a well appearing Caucasian female in no acute distress.  She's alert and oriented x4 and appropriate throughout the examination. Cardiopulmonary assessment is negative for acute distress and she exhibits normal effort.  Breast exam is deferred.   ECOG = 0  0 -  Asymptomatic (Fully active, able to carry on all predisease activities without restriction)  1 - Symptomatic but completely ambulatory (Restricted in physically strenuous activity but ambulatory and able to carry out work of a light or sedentary nature. For example, light housework, office work)  2 - Symptomatic, <50% in bed during the day (Ambulatory and capable of all self care but unable to carry out any work activities. Up and about more than 50% of waking hours)  3 - Symptomatic, >50% in bed, but not bedbound (Capable of only limited self-care, confined to bed or chair 50% or more of waking hours)  4 - Bedbound (Completely disabled. Cannot carry on any self-care. Totally confined to bed or chair)  5 - Death   Eustace Pen MM, Creech RH, Tormey DC, et al. [phone]). "Toxicity and response criteria of the [facility] Group". [place] Oncol. 5 (6): 649-55    LABORATORY DATA:  Lab Results  Component Value Date   WBC 6.5 01/20/2020   HGB 13.3 01/20/2020   HCT 40.9 01/20/2020   MCV 97.4 01/20/2020   PLT 227 01/20/2020   Lab Results  Component Value Date   NA 141 01/20/2020   K 4.2 01/20/2020   CL 104 01/20/2020   CO2 27 01/20/2020   Lab Results  Component  Value Date   ALT 15 12/23/2019   AST 20 12/23/2019   ALKPHOS 47 12/23/2019   BILITOT 0.5 12/23/2019      RADIOGRAPHY: No results found.     IMPRESSION/PLAN: 1. 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 Dr. Lisbeth Renshaw discusses the final pathology findings and reviews the nature of noninvasive right breast disease.  While the patient's pathology is quite reassuring that there was no residual disease we again reviewed Dr. Virgie Dad concern that she would not tolerate or be a candidate for antiestrogen therapy.  During her previous conversation given the skin she was offered adjuvant radiotherapy.  We discussed the typical utility of this therapy for women who have breast conservation surgery.  The patient is still interested in proceeding with radiotherapy.   We discussed the risks, benefits, short, and long term effects of radiotherapy, as well as the curative intent, and  Dr. Lisbeth Renshaw discusses the delivery and logistics of radiotherapy and recommends 4 weeks of radiotherapy. Written consent is obtained and placed in the chart, a copy was provided to the patient.    In a visit lasting 45 minutes, greater than 50% of the time was spent face to face reviewing her case, as well as in preparation of, discussing, and coordinating the patient's care.  The above documentation reflects my direct findings during this shared patient visit. Please see the separate note by Dr. Lisbeth Renshaw on this date for the remainder of the patient's plan of care.    Carola Rhine, PAC

## 2020-02-26 ENCOUNTER — Telehealth: Payer: Self-pay | Admitting: Oncology

## 2020-02-26 ENCOUNTER — Encounter: Payer: Self-pay | Admitting: *Deleted

## 2020-02-26 DIAGNOSIS — Z170 Estrogen receptor positive status [ER+]: Secondary | ICD-10-CM | POA: Diagnosis not present

## 2020-02-26 DIAGNOSIS — Z510 Encounter for antineoplastic radiation therapy: Secondary | ICD-10-CM | POA: Diagnosis not present

## 2020-02-26 DIAGNOSIS — D0511 Intraductal carcinoma in situ of right breast: Secondary | ICD-10-CM | POA: Diagnosis not present

## 2020-02-26 NOTE — Telephone Encounter (Signed)
Scheduled appt per 2/4 schmsg - mailed letter with appt date and time

## 2020-03-03 ENCOUNTER — Ambulatory Visit
Admission: RE | Admit: 2020-03-03 | Discharge: 2020-03-03 | Disposition: A | Discharge status: Home / Self Care | Payer: Medicare PPO | Source: Ambulatory Visit | Attending: Radiation Oncology | Admitting: Radiation Oncology

## 2020-03-03 ENCOUNTER — Other Ambulatory Visit: Payer: Self-pay

## 2020-03-03 DIAGNOSIS — Z510 Encounter for antineoplastic radiation therapy: Secondary | ICD-10-CM | POA: Diagnosis not present

## 2020-03-03 DIAGNOSIS — Z170 Estrogen receptor positive status [ER+]: Secondary | ICD-10-CM | POA: Diagnosis not present

## 2020-03-03 DIAGNOSIS — D0511 Intraductal carcinoma in situ of right breast: Secondary | ICD-10-CM | POA: Diagnosis not present

## 2020-03-03 NOTE — Progress Notes (Signed)
Pt here for patient teaching.  Pt given Radiation and You booklet, skin care instructions, Alra deodorant and Radiaplex gel.  Reviewed areas of pertinence such as fatigue, hair loss, skin changes, breast tenderness and breast swelling . Pt able to give teach back of to pat skin and use unscented/gentle soap,apply Radiaplex bid, avoid applying anything to skin within 4 hours of treatment, avoid wearing an under wire bra and to use an electric razor if they must shave. Pt verbalizes understanding of information given and will contact nursing with any questions or concerns.    Orrin Brigham LPN

## 2020-03-04 ENCOUNTER — Other Ambulatory Visit: Payer: Self-pay

## 2020-03-04 ENCOUNTER — Ambulatory Visit
Admission: RE | Admit: 2020-03-04 | Discharge: 2020-03-04 | Disposition: A | Discharge status: Home / Self Care | Payer: Medicare PPO | Source: Ambulatory Visit | Attending: Radiation Oncology | Admitting: Radiation Oncology

## 2020-03-04 DIAGNOSIS — D0511 Intraductal carcinoma in situ of right breast: Secondary | ICD-10-CM | POA: Diagnosis not present

## 2020-03-04 DIAGNOSIS — Z170 Estrogen receptor positive status [ER+]: Secondary | ICD-10-CM | POA: Diagnosis not present

## 2020-03-04 DIAGNOSIS — Z510 Encounter for antineoplastic radiation therapy: Secondary | ICD-10-CM | POA: Diagnosis not present

## 2020-03-04 MED ORDER — ALRA NON-METALLIC DEODORANT (RAD-ONC)
1.0000 "application " | Freq: Once | TOPICAL | Status: AC
  Administered 2020-03-04: 1 via TOPICAL

## 2020-03-04 MED ORDER — RADIAPLEXRX EX GEL: Freq: Once | CUTANEOUS | Status: AC

## 2020-03-07 ENCOUNTER — Ambulatory Visit
Admission: RE | Admit: 2020-03-07 | Discharge: 2020-03-07 | Disposition: A | Discharge status: Home / Self Care | Payer: Medicare PPO | Source: Ambulatory Visit | Attending: Radiation Oncology | Admitting: Radiation Oncology

## 2020-03-07 ENCOUNTER — Other Ambulatory Visit: Payer: Self-pay

## 2020-03-07 DIAGNOSIS — Z510 Encounter for antineoplastic radiation therapy: Secondary | ICD-10-CM | POA: Diagnosis not present

## 2020-03-07 DIAGNOSIS — Z170 Estrogen receptor positive status [ER+]: Secondary | ICD-10-CM | POA: Diagnosis not present

## 2020-03-07 DIAGNOSIS — D0511 Intraductal carcinoma in situ of right breast: Secondary | ICD-10-CM | POA: Diagnosis not present

## 2020-03-08 ENCOUNTER — Ambulatory Visit
Admission: RE | Admit: 2020-03-08 | Discharge: 2020-03-08 | Disposition: A | Discharge status: Home / Self Care | Payer: Medicare PPO | Source: Ambulatory Visit | Attending: Radiation Oncology | Admitting: Radiation Oncology

## 2020-03-08 ENCOUNTER — Other Ambulatory Visit: Payer: Self-pay

## 2020-03-08 DIAGNOSIS — Z510 Encounter for antineoplastic radiation therapy: Secondary | ICD-10-CM | POA: Diagnosis not present

## 2020-03-08 DIAGNOSIS — Z170 Estrogen receptor positive status [ER+]: Secondary | ICD-10-CM | POA: Diagnosis not present

## 2020-03-08 DIAGNOSIS — D0511 Intraductal carcinoma in situ of right breast: Secondary | ICD-10-CM | POA: Diagnosis not present

## 2020-03-09 ENCOUNTER — Other Ambulatory Visit: Payer: Self-pay

## 2020-03-09 ENCOUNTER — Ambulatory Visit
Admission: RE | Admit: 2020-03-09 | Discharge: 2020-03-09 | Disposition: A | Discharge status: Home / Self Care | Payer: Medicare PPO | Source: Ambulatory Visit | Attending: Radiation Oncology | Admitting: Radiation Oncology

## 2020-03-09 DIAGNOSIS — Z170 Estrogen receptor positive status [ER+]: Secondary | ICD-10-CM | POA: Diagnosis not present

## 2020-03-09 DIAGNOSIS — D0511 Intraductal carcinoma in situ of right breast: Secondary | ICD-10-CM | POA: Diagnosis not present

## 2020-03-09 DIAGNOSIS — Z510 Encounter for antineoplastic radiation therapy: Secondary | ICD-10-CM | POA: Diagnosis not present

## 2020-03-10 ENCOUNTER — Encounter: Payer: Self-pay | Admitting: Radiation Oncology

## 2020-03-10 ENCOUNTER — Ambulatory Visit
Admission: RE | Admit: 2020-03-10 | Discharge: 2020-03-10 | Disposition: A | Discharge status: Home / Self Care | Payer: Medicare PPO | Source: Ambulatory Visit | Attending: Radiation Oncology | Admitting: Radiation Oncology

## 2020-03-10 DIAGNOSIS — Z170 Estrogen receptor positive status [ER+]: Secondary | ICD-10-CM | POA: Diagnosis not present

## 2020-03-10 DIAGNOSIS — Z510 Encounter for antineoplastic radiation therapy: Secondary | ICD-10-CM | POA: Diagnosis not present

## 2020-03-10 DIAGNOSIS — D0511 Intraductal carcinoma in situ of right breast: Secondary | ICD-10-CM | POA: Diagnosis not present

## 2020-03-11 ENCOUNTER — Telehealth: Payer: Self-pay

## 2020-03-11 ENCOUNTER — Ambulatory Visit
Admission: RE | Admit: 2020-03-11 | Discharge: 2020-03-11 | Disposition: A | Discharge status: Home / Self Care | Payer: Medicare PPO | Source: Ambulatory Visit | Attending: Radiation Oncology | Admitting: Radiation Oncology

## 2020-03-11 ENCOUNTER — Ambulatory Visit: Payer: Medicare PPO

## 2020-03-11 DIAGNOSIS — Z510 Encounter for antineoplastic radiation therapy: Secondary | ICD-10-CM | POA: Diagnosis not present

## 2020-03-11 DIAGNOSIS — Z170 Estrogen receptor positive status [ER+]: Secondary | ICD-10-CM | POA: Diagnosis not present

## 2020-03-11 DIAGNOSIS — D0511 Intraductal carcinoma in situ of right breast: Secondary | ICD-10-CM | POA: Diagnosis not present

## 2020-03-11 NOTE — Telephone Encounter (Signed)
Prolia VOB initiated via [employer] portal.  

## 2020-03-14 ENCOUNTER — Other Ambulatory Visit: Payer: Self-pay

## 2020-03-14 ENCOUNTER — Ambulatory Visit
Admission: RE | Admit: 2020-03-14 | Discharge: 2020-03-14 | Disposition: A | Discharge status: Home / Self Care | Payer: Medicare PPO | Source: Ambulatory Visit | Attending: Radiation Oncology | Admitting: Radiation Oncology

## 2020-03-14 DIAGNOSIS — Z510 Encounter for antineoplastic radiation therapy: Secondary | ICD-10-CM | POA: Diagnosis not present

## 2020-03-14 DIAGNOSIS — D0511 Intraductal carcinoma in situ of right breast: Secondary | ICD-10-CM | POA: Diagnosis not present

## 2020-03-14 DIAGNOSIS — Z170 Estrogen receptor positive status [ER+]: Secondary | ICD-10-CM | POA: Diagnosis not present

## 2020-03-15 ENCOUNTER — Other Ambulatory Visit: Payer: Self-pay

## 2020-03-15 ENCOUNTER — Ambulatory Visit
Admission: RE | Admit: 2020-03-15 | Discharge: 2020-03-15 | Disposition: A | Discharge status: Home / Self Care | Payer: Medicare PPO | Source: Ambulatory Visit | Attending: Radiation Oncology | Admitting: Radiation Oncology

## 2020-03-15 DIAGNOSIS — Z170 Estrogen receptor positive status [ER+]: Secondary | ICD-10-CM | POA: Diagnosis not present

## 2020-03-15 DIAGNOSIS — D0511 Intraductal carcinoma in situ of right breast: Secondary | ICD-10-CM | POA: Diagnosis not present

## 2020-03-15 DIAGNOSIS — Z510 Encounter for antineoplastic radiation therapy: Secondary | ICD-10-CM | POA: Diagnosis not present

## 2020-03-16 ENCOUNTER — Ambulatory Visit
Admission: RE | Admit: 2020-03-16 | Discharge: 2020-03-16 | Disposition: A | Discharge status: Home / Self Care | Payer: Medicare PPO | Source: Ambulatory Visit | Attending: Radiation Oncology | Admitting: Radiation Oncology

## 2020-03-16 ENCOUNTER — Other Ambulatory Visit: Payer: Self-pay

## 2020-03-16 DIAGNOSIS — Z170 Estrogen receptor positive status [ER+]: Secondary | ICD-10-CM | POA: Diagnosis not present

## 2020-03-16 DIAGNOSIS — Z510 Encounter for antineoplastic radiation therapy: Secondary | ICD-10-CM | POA: Diagnosis not present

## 2020-03-16 DIAGNOSIS — D0511 Intraductal carcinoma in situ of right breast: Secondary | ICD-10-CM | POA: Diagnosis not present

## 2020-03-16 NOTE — Progress Notes (Signed)
  Radiation Oncology         [phone] ________________________________  Name: LINNAEA AHN MRN: [id number]  Date: 03/10/2020  DOB: 1940/07/19  SIMULATION NOTE   NARRATIVE:  The patient underwent simulation today for ongoing radiation therapy.  The existing CT study set was employed for the purpose of virtual treatment planning.  The target and avoidance structures were reviewed and modified as necessary.  Treatment planning then occurred.  The radiation boost prescription was entered and confirmed.  A total of 1 complex treatment devices were fabricated in the form of multi-leaf collimators to shape radiation around the targets while maximally excluding nearby normal structures. I have requested : Isodose Plan.    PLAN:  This modified radiation beam arrangement is intended to continue the current radiation dose to an additional 8 Gy in 4 fractions for a total cumulative dose of 50.56 Gy.    ------------------------------------------------  Jodelle Gross, MD, PhD

## 2020-03-17 ENCOUNTER — Ambulatory Visit
Admission: RE | Admit: 2020-03-17 | Discharge: 2020-03-17 | Disposition: A | Discharge status: Home / Self Care | Payer: Medicare PPO | Source: Ambulatory Visit | Attending: Radiation Oncology | Admitting: Radiation Oncology

## 2020-03-17 ENCOUNTER — Other Ambulatory Visit: Payer: Self-pay

## 2020-03-17 DIAGNOSIS — Z170 Estrogen receptor positive status [ER+]: Secondary | ICD-10-CM | POA: Diagnosis not present

## 2020-03-17 DIAGNOSIS — D0511 Intraductal carcinoma in situ of right breast: Secondary | ICD-10-CM | POA: Diagnosis not present

## 2020-03-17 DIAGNOSIS — Z510 Encounter for antineoplastic radiation therapy: Secondary | ICD-10-CM | POA: Diagnosis not present

## 2020-03-18 ENCOUNTER — Ambulatory Visit
Admission: RE | Admit: 2020-03-18 | Discharge: 2020-03-18 | Disposition: A | Discharge status: Home / Self Care | Payer: Medicare PPO | Source: Ambulatory Visit | Attending: Radiation Oncology | Admitting: Radiation Oncology

## 2020-03-18 ENCOUNTER — Other Ambulatory Visit: Payer: Self-pay

## 2020-03-18 DIAGNOSIS — Z510 Encounter for antineoplastic radiation therapy: Secondary | ICD-10-CM | POA: Diagnosis not present

## 2020-03-18 DIAGNOSIS — Z170 Estrogen receptor positive status [ER+]: Secondary | ICD-10-CM | POA: Diagnosis not present

## 2020-03-18 DIAGNOSIS — D0511 Intraductal carcinoma in situ of right breast: Secondary | ICD-10-CM | POA: Diagnosis not present

## 2020-03-21 ENCOUNTER — Ambulatory Visit
Admission: RE | Admit: 2020-03-21 | Discharge: 2020-03-21 | Disposition: A | Discharge status: Home / Self Care | Payer: Medicare PPO | Source: Ambulatory Visit | Attending: Radiation Oncology | Admitting: Radiation Oncology

## 2020-03-21 ENCOUNTER — Other Ambulatory Visit: Payer: Self-pay

## 2020-03-21 DIAGNOSIS — Z170 Estrogen receptor positive status [ER+]: Secondary | ICD-10-CM | POA: Diagnosis not present

## 2020-03-21 DIAGNOSIS — D0511 Intraductal carcinoma in situ of right breast: Secondary | ICD-10-CM | POA: Diagnosis not present

## 2020-03-21 DIAGNOSIS — Z510 Encounter for antineoplastic radiation therapy: Secondary | ICD-10-CM | POA: Diagnosis not present

## 2020-03-22 ENCOUNTER — Ambulatory Visit
Admission: RE | Admit: 2020-03-22 | Discharge: 2020-03-22 | Disposition: A | Discharge status: Home / Self Care | Payer: Medicare PPO | Source: Ambulatory Visit | Attending: Radiation Oncology | Admitting: Radiation Oncology

## 2020-03-22 ENCOUNTER — Other Ambulatory Visit: Payer: Self-pay

## 2020-03-22 DIAGNOSIS — Z170 Estrogen receptor positive status [ER+]: Secondary | ICD-10-CM | POA: Diagnosis not present

## 2020-03-22 DIAGNOSIS — Z510 Encounter for antineoplastic radiation therapy: Secondary | ICD-10-CM | POA: Insufficient documentation

## 2020-03-22 DIAGNOSIS — D0511 Intraductal carcinoma in situ of right breast: Secondary | ICD-10-CM | POA: Insufficient documentation

## 2020-03-23 ENCOUNTER — Other Ambulatory Visit: Payer: Self-pay

## 2020-03-23 ENCOUNTER — Ambulatory Visit
Admission: RE | Admit: 2020-03-23 | Discharge: 2020-03-23 | Disposition: A | Discharge status: Home / Self Care | Payer: Medicare PPO | Source: Ambulatory Visit | Attending: Radiation Oncology | Admitting: Radiation Oncology

## 2020-03-23 DIAGNOSIS — Z170 Estrogen receptor positive status [ER+]: Secondary | ICD-10-CM | POA: Diagnosis not present

## 2020-03-23 DIAGNOSIS — D0511 Intraductal carcinoma in situ of right breast: Secondary | ICD-10-CM | POA: Diagnosis not present

## 2020-03-23 DIAGNOSIS — Z510 Encounter for antineoplastic radiation therapy: Secondary | ICD-10-CM | POA: Diagnosis not present

## 2020-03-24 ENCOUNTER — Other Ambulatory Visit: Payer: Self-pay

## 2020-03-24 ENCOUNTER — Ambulatory Visit: Payer: Medicare PPO

## 2020-03-24 ENCOUNTER — Ambulatory Visit
Admission: RE | Admit: 2020-03-24 | Discharge: 2020-03-24 | Disposition: A | Discharge status: Home / Self Care | Payer: Medicare PPO | Source: Ambulatory Visit | Attending: Radiation Oncology | Admitting: Radiation Oncology

## 2020-03-24 DIAGNOSIS — Z170 Estrogen receptor positive status [ER+]: Secondary | ICD-10-CM | POA: Diagnosis not present

## 2020-03-24 DIAGNOSIS — D0511 Intraductal carcinoma in situ of right breast: Secondary | ICD-10-CM | POA: Diagnosis not present

## 2020-03-24 DIAGNOSIS — Z510 Encounter for antineoplastic radiation therapy: Secondary | ICD-10-CM | POA: Diagnosis not present

## 2020-03-25 ENCOUNTER — Telehealth: Payer: Self-pay

## 2020-03-25 ENCOUNTER — Ambulatory Visit
Admission: RE | Admit: 2020-03-25 | Discharge: 2020-03-25 | Disposition: A | Discharge status: Home / Self Care | Payer: Medicare PPO | Source: Ambulatory Visit | Attending: Radiation Oncology | Admitting: Radiation Oncology

## 2020-03-25 ENCOUNTER — Other Ambulatory Visit: Payer: Self-pay

## 2020-03-25 ENCOUNTER — Ambulatory Visit: Payer: Medicare PPO

## 2020-03-25 DIAGNOSIS — D0511 Intraductal carcinoma in situ of right breast: Secondary | ICD-10-CM | POA: Diagnosis not present

## 2020-03-25 DIAGNOSIS — Z170 Estrogen receptor positive status [ER+]: Secondary | ICD-10-CM | POA: Diagnosis not present

## 2020-03-25 DIAGNOSIS — Z510 Encounter for antineoplastic radiation therapy: Secondary | ICD-10-CM | POA: Diagnosis not present

## 2020-03-25 NOTE — Telephone Encounter (Signed)
Confirmed next week's appointments for pt per her request.

## 2020-03-28 ENCOUNTER — Other Ambulatory Visit: Payer: Self-pay

## 2020-03-28 ENCOUNTER — Ambulatory Visit
Admission: RE | Admit: 2020-03-28 | Discharge: 2020-03-28 | Disposition: A | Discharge status: Home / Self Care | Payer: Medicare PPO | Source: Ambulatory Visit | Attending: Radiation Oncology | Admitting: Radiation Oncology

## 2020-03-28 ENCOUNTER — Ambulatory Visit: Payer: Medicare PPO

## 2020-03-28 DIAGNOSIS — Z170 Estrogen receptor positive status [ER+]: Secondary | ICD-10-CM | POA: Diagnosis not present

## 2020-03-28 DIAGNOSIS — Z510 Encounter for antineoplastic radiation therapy: Secondary | ICD-10-CM | POA: Diagnosis not present

## 2020-03-28 DIAGNOSIS — D0511 Intraductal carcinoma in situ of right breast: Secondary | ICD-10-CM | POA: Diagnosis not present

## 2020-03-28 NOTE — Progress Notes (Signed)
[name]  [phone] [phone]     ID: ARLESIA KIEL DOB: Dec 27, 1940  MR#: [id number]  JTT#:[id number]  Patient Care Team: Vernie Shanks, MD as PCP - General (Family Medicine) Jettie Booze, MD as PCP - Cardiology (Cardiology) Constance Haw, MD as PCP - Electrophysiology (Cardiology) Mauro Kaufmann, RN as Oncology Nurse Navigator Rockwell Germany, RN as Oncology Nurse Navigator Coralie Keens, MD as Consulting Physician (General Surgery) Christa Fasig, Virgie Dad, MD as Consulting Physician (Oncology) Kyung Rudd, MD as Consulting Physician (Radiation Oncology) Martinique, Amy, MD as Consulting Physician (Dermatology) Salvadore Dom, MD as Consulting Physician (Obstetrics and Gynecology) [facility], Melanie Crazier, MD as Consulting Physician (Endocrinology) Chauncey Cruel, MD OTHER MD:  CHIEF COMPLAINT: estrogen receptor positive noninvasive breast cancer  CURRENT TREATMENT: Observation   INTERVAL HISTORY: [name] was evaluated in the multidisciplinary breast cancer clinic on 12/23/2019.  She underwent right lumpectomy on 01/27/2020 under Dr. Ninfa Linden. Pathology from the procedure ([id number]) showed: complex sclerosing lesion; intraductal papilloma; fibrocystic change, sclerosing adenosis, and usual ductal hyperplasia; no residual DCIS.  She was referred back to Dr. Lisbeth Renshaw on 02/25/2020 to review radiation therapy. She subsequently began treatment on 03/03/2020 and is scheduled to finish tomorrow, 03/30/2020.   REVIEW OF SYSTEMS: [name] did very well with her radiation.  She had no significant side effects at all until 2 days ago when she started having some itching.  She has had no desquamation.  She continues to walk at least a mile a day and does yoga about 3 times a week.  Detailed review of systems was otherwise stable   COVID 19 VACCINATION STATUS: Status post both doses plus booster November 2021   HISTORY OF CURRENT  ILLNESS: From the original intake note:  Anna Mathews reports a history of bilateral benign breast tumors, in 60 (age 56) and 53 (age 84).  She had no problems with those minor surgeries.  She had routine screening mammography on 11/27/2019 showing calcifications in the right breast. She underwent right diagnostic mammography with tomography at [facility] on 12/04/2019 showing: breast density category C; 0.4 cm linear calcifications in upper-inner right breast.  Accordingly on 12/15/2019 she proceeded to biopsy of the right breast area in question. The pathology from this procedure ([id number]) showed: ductal carcinoma in situ, high grade with focal necrosis. Prognostic indicators significant for: estrogen receptor, 95% positive and progesterone receptor, 40% positive, both with strong staining intensity.   [name] subsequent history is as detailed below.   PAST MEDICAL HISTORY: Past Medical History:  Diagnosis Date  . Atrial fibrillation ([place]) 09/2009  . Breast cancer ([place])   . Diverticulosis 09/2006  . GERD (gastroesophageal reflux disease)   . Hiatal hernia   . Hyperlipidemia   . Hypertension   . Osteopenia   . Postmenopausal hormone replacement therapy 1989 - 07/2000  Skin cancer (nonmelanoma) - 2020   PAST SURGICAL HISTORY: Past Surgical History:  Procedure Laterality Date  . 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 BREAST LUMPECTOMY WITH RADIOACTIVE SEED LOCALIZATION Right 01/27/2020   Procedure: RIGHT BREAST LUMPECTOMY WITH RADIOACTIVE SEED LOCALIZATION;  Surgeon: Coralie Keens, MD;  Location: [facility];  Service: General;  Laterality: Right;  . BREAST SURGERY Left 4/99   breast biopsy - fibrosis  . CARDIOVERSION N/A 07/16/2017   Procedure: CARDIOVERSION;  Surgeon: Jerline Pain,  MD;  Location: [facility];  Service:  Cardiovascular;  Laterality: N/A;  . CARDIOVERSION N/A 10/20/2018   Procedure: CARDIOVERSION;  Surgeon: Buford Dresser, MD;  Location: [facility];  Service: Cardiovascular;  Laterality: N/A;  . CATARACT EXTRACTION W/ INTRAOCULAR LENS IMPLANT Right   . CESAREAN SECTION     X 2  . HYSTEROSCOPY  4/95   w/D&C  . TEE WITHOUT CARDIOVERSION N/A 10/20/2018   Procedure: TRANSESOPHAGEAL ECHOCARDIOGRAM (TEE);  Surgeon: Buford Dresser, MD;  Location: [facility] ENDOSCOPY;  Service: Cardiovascular;  Laterality: N/A;    FAMILY HISTORY: Family History  Problem Relation Age of Onset  . Hypertension Mother   . Heart disease Father   . COPD Father   . Diabetes Maternal Grandfather   . Diabetes Son   . Rheum arthritis Brother   . Kidney cancer Brother   . Breast cancer Maternal Grandmother   . Diabetes Maternal Aunt   . Diabetes Maternal Aunt    Her mother died at age 45 from Bra[place]. Her father died at age 29 from COPD/E. Anna Mathews has one brother (and no sisters), and he was diagnosed with renal cell carcinoma. There is no other family history of cancer to her knowledge.   GYNECOLOGIC HISTORY:  Patient's last menstrual period was 01/23/1988 (approximate). Menarche: 80 years old Age at first live birth: 80 years old [place] P 2 LMP 1989 Contraceptive: used for about 10 years HRT: never used  Hysterectomy? no BSO? 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 She is widowed. (Her husband was Marya Landry.) She lives in a condo by herself with no pets. Daughter Raquel James, age 80, is a Electrical engineer with Hotel manager in advertising here in [place], and her husband being a Proofreader. Son Percell Miller "Alex" Fairhaven, age 72, is a Marine scientist in [place]. Janeth has 5 grandchildren. She attends [employer].    ADVANCED DIRECTIVES: in place, and intends to name her daughter [name] as her HCPOA.   HEALTH  MAINTENANCE: Social History   Tobacco Use  . Smoking status: Former Smoker    Packs/day: 0.25    Years: 12.00    Pack years: 3.00    Types: Cigarettes    Start date: 01/16/1957    Quit date: 01/23/1968    Years since quitting: 52.2  . Smokeless tobacco: Never Used  Vaping Use  . Vaping Use: Never used  Substance Use Topics  . Alcohol use: Yes    Comment: social  . Drug use: No     Colonoscopy: Fall 2019  PAP: 2019  Bone density: 2017, now on Prolia   Allergies  Allergen Reactions  . Penicillins Other (See Comments)    REACTION: "ITCHING IN EYES" Has patient had a PCN reaction causing immediate rash, facial/tongue/throat swelling, SOB or lightheadedness with hypotension: Unknown Has patient had a PCN reaction causing severe rash involving mucus membranes or skin necrosis: Unknown Has patient had a PCN reaction that required hospitalization: No Has patient had a PCN reaction occurring within the last 10 years: No If all of the above answers are "NO", then may proceed with Cephalosporin use.   Otho Darner Allergy] Itching    Full body itching  . Sulfa Antibiotics Other (See Comments)    REACTION: " NOT SURE"    Current Outpatient Medications  Medication Sig Dispense Refill  . acetaminophen (TYLENOL) 500 MG tablet Take 500-1,000 mg by mouth every 6 (six) hours as needed (  pain.).    Marland Kitchen amLODipine (NORVASC) 5 MG tablet TAKE 1 TABLET BY MOUTH EVERY DAY (Patient taking differently: Take 5 mg by mouth daily.) 90 tablet 3  . aspirin 81 MG chewable tablet Chew 81 mg by mouth daily.    . Biotin 5000 MCG TABS Take 5,000 mcg by mouth daily.    . Calcium-Magnesium-Vitamin D (CALCIUM 1200+D3 PO) Take 1 tablet by mouth in the morning and at bedtime.    . Camphor-Menthol-Methyl Sal (SALONPAS) 3.01-28-08 % PTCH Place 1 patch onto the skin daily as needed (knee/back pain.).    Marland Kitchen ELIQUIS 5 MG TABS tablet TAKE 1 TABLET BY MOUTH TWICE A DAY (Patient taking differently: Take 5 mg by mouth 2  (two) times daily.) 60 tablet 5  . flecainide (TAMBOCOR) 150 MG tablet TAKE 1 TABLET BY MOUTH TWICE A DAY (Patient taking differently: Take 150 mg by mouth 2 (two) times daily.) 180 tablet 3  . Omega-3 Fatty Acids (FISH OIL) 1200 MG CAPS Take 1,200 mg by mouth in the morning and at bedtime.    Vladimir Faster Glycol-Propyl Glycol (LUBRICANT EYE DROPS) 0.4-0.3 % SOLN Place 1 drop into both eyes 3 (three) times daily as needed (dry/irritated eyes).    . PROLIA 60 MG/ML SOSY injection INJECT 60MG  SUBCUTANEOUSLY  INTO UPPER ARM, THIGH, OR  ABDOMEN EVERY 6 MONTHS  (GIVEN AT PRESCRIBERS  OFFICE) (Patient taking differently: Inject 60 mg into the skin every 6 (six) months.) 1 mL 1  . quinapril (ACCUPRIL) 40 MG tablet Take 40 mg by mouth daily.     . simvastatin (ZOCOR) 20 MG tablet Take 20 mg by mouth every evening.     . solifenacin (VESICARE) 10 MG tablet Take 10 mg by mouth daily.    . traMADol (ULTRAM) 50 MG tablet Take 1 tablet (50 mg total) by mouth every 6 (six) hours as needed for moderate pain or severe pain. 20 tablet 0   No current facility-administered medications for this visit.    OBJECTIVE: White woman in no acute distress  Vitals:   03/29/20 1008  BP: (!) 134/57  Pulse: 65  Resp: 18  Temp: 97.7 F (36.5 C)  SpO2: 99%     Body mass index is 25.3 kg/m.   Wt Readings from Last 3 Encounters:  03/29/20 142 lb 12.8 oz (64.8 kg)  02/25/20 141 lb 4 oz (64.1 kg)  01/27/20 142 lb 3.2 oz (64.5 kg)      ECOG FS:1 - Symptomatic but completely ambulatory  Ocular: Sclerae unicteric, pupils round and equal Ear-nose-throat: Wearing a mask Lymphatic: No cervical or supraclavicular adenopathy Lungs no rales or rhonchi Heart regular rate and rhythm Abd soft, nontender, positive bowel sounds MSK no focal spinal tenderness, no joint edema Neuro: non-focal, well-oriented, appropriate affect Breasts: The right breast is status post lumpectomy and now a radiation.  The cosmetic result is  excellent.  There is some erythema but no desquamation.  The left breast is benign.  Both axillae are benign.  LAB RESULTS:  CMP     Component Value Date/Time   NA 141 01/20/2020 1346   NA 141 10/14/2018 1434   K 4.2 01/20/2020 1346   CL 104 01/20/2020 1346   CO2 27 01/20/2020 1346   GLUCOSE 108 (H) 01/20/2020 1346   BUN 20 01/20/2020 1346   BUN 26 10/14/2018 1434   CREATININE 0.90 01/20/2020 1346   CREATININE 1.12 (H) 12/23/2019 1411   CALCIUM 9.4 01/20/2020 1346   PROT 6.7 12/23/2019 1411  PROT 6.6 12/24/2016 0900   ALBUMIN 3.9 12/23/2019 1411   ALBUMIN 4.4 12/24/2016 0900   AST 20 12/23/2019 1411   ALT 15 12/23/2019 1411   ALKPHOS 47 12/23/2019 1411   BILITOT 0.5 12/23/2019 1411   GFRNONAA >60 01/20/2020 1346   GFRNONAA 50 (L) 12/23/2019 1411   GFRAA 66 10/14/2018 1434    No results found for: TOTALPROTELP, ALBUMINELP, A1GS, A2GS, BETS, BETA2SER, GAMS, MSPIKE, SPEI  Lab Results  Component Value Date   WBC 6.5 01/20/2020   NEUTROABS 4.3 12/23/2019   HGB 13.3 01/20/2020   HCT 40.9 01/20/2020   MCV 97.4 01/20/2020   PLT 227 01/20/2020    No results found for: LABCA2  No components found for: TOIZTI458  No results for input(s): INR in the last 168 hours.  No results found for: LABCA2  No results found for: KDX833  No results found for: ASN053  No results found for: ZJQ734  No results found for: CA2729  No components found for: HGQUANT  No results found for: CEA1 / No results found for: CEA1   No results found for: AFPTUMOR  No results found for: CHROMOGRNA  No results found for: KPAFRELGTCHN, LAMBDASER, KAPLAMBRATIO (kappa/lambda light chains)  No results found for: HGBA, HGBA2QUANT, HGBFQUANT, HGBSQUAN (Hemoglobinopathy evaluation)   No results found for: LDH  No results found for: IRON, TIBC, IRONPCTSAT (Iron and TIBC)  No results found for: FERRITIN  Urinalysis    Component Value Date/Time   COLORURINE YELLOW 10/27/2007 1414    APPEARANCEUR CLEAR 10/27/2007 1414   LABSPEC 1.013 10/27/2007 1414   PHURINE 7.5 10/27/2007 1414   GLUCOSEU NEGATIVE 10/27/2007 1414   HGBUR NEGATIVE 10/27/2007 1414   BILIRUBINUR NEGATIVE 10/27/2007 1414   KETONESUR NEGATIVE 10/27/2007 1414   PROTEINUR NEGATIVE 10/27/2007 1414   UROBILINOGEN 0.2 10/27/2007 1414   NITRITE NEGATIVE 10/27/2007 1414   LEUKOCYTESUR TRACE (A) 10/27/2007 1414     STUDIES: No results found.   ELIGIBLE FOR AVAILABLE RESEARCH PROTOCOL: no  ASSESSMENT: 80 y.o. [place] woman status post right breast biopsy 12/15/2019 for ductal carcinoma in situ, grade 3, estrogen and progesterone receptor positive  (1) status post right lumpectomy 01/27/2020 showing only a complex sclerosing lesion and intraductal papilloma.  There was no malignancy identified.  (2) adjuvant radiation to follow  (3) opted against adjuvant antiestrogens  PLAN: Minal had a very small noninvasive tumor which was completely removed at the time of biopsy.  Lumpectomy showed only a complex sclerosing lesion and a papilloma, both benign.  She has undergone radiation which cuts the already low risk of recurrence by more than half.  I do not think she would benefit significantly from antiestrogens.  The risk of recurrence is very low and the risk of another breast cancer developing is also less than 10% given her age. Tamoxifen and anastrozole do have significant side effects and complications which I think are best avoided.  She understands after her surgery and radiation she may develop some aches or sensitivities in the breast and sometimes shooting pains.  These things may come and go over the years.  They are all benign and due to scar tissue so if they occur she may reassure herself but of course if she needs further reassurance we will be glad to see her.  Otherwise I am comfortable with her "graduating" from breast cancer today.  All she will need in terms of breast cancer follow-up is her  yearly mammography and a yearly physician breast exam.  Of course we will be glad to see her again at any point in the future if and when the need arises but as of now are making no further routine appointments for her here  Total encounter time 20 minutes.Sarajane Jews C. Tiearra Colwell, MD 03/29/2020 10:31 AM Medical Oncology and Hematology [facility] [place], [place] [address] Tel. [phone]    Fax. [phone]   This document serves as a record of services personally performed by Lurline Del, MD. It was created on his behalf by Wilburn Mylar, a trained medical scribe. The creation of this record is based on the scribe's personal observations and the provider's statements to them.   I, Lurline Del MD, have reviewed the above documentation for accuracy and completeness, and I agree with the above.    *Total Encounter Time as defined by the Centers for Medicare and Medicaid Services includes, in addition to the face-to-face time of a patient visit (documented in the note above) non-face-to-face time: obtaining and reviewing outside history, ordering and reviewing medications, tests or procedures, care coordination (communications with other health care professionals or caregivers) and documentation in the medical record.

## 2020-03-29 ENCOUNTER — Ambulatory Visit
Admission: RE | Admit: 2020-03-29 | Discharge: 2020-03-29 | Disposition: A | Discharge status: Home / Self Care | Payer: Medicare PPO | Source: Ambulatory Visit | Attending: Radiation Oncology | Admitting: Radiation Oncology

## 2020-03-29 ENCOUNTER — Inpatient Hospital Stay: Payer: Medicare PPO | Attending: Oncology | Admitting: Oncology

## 2020-03-29 ENCOUNTER — Ambulatory Visit: Payer: Medicare PPO

## 2020-03-29 ENCOUNTER — Other Ambulatory Visit: Payer: Self-pay

## 2020-03-29 VITALS — BP 134/57 | HR 65 | Temp 97.7°F | Resp 18 | Ht 63.0 in | Wt 142.8 lb

## 2020-03-29 DIAGNOSIS — Z87891 Personal history of nicotine dependence: Secondary | ICD-10-CM | POA: Insufficient documentation

## 2020-03-29 DIAGNOSIS — Z7982 Long term (current) use of aspirin: Secondary | ICD-10-CM | POA: Diagnosis not present

## 2020-03-29 DIAGNOSIS — Z170 Estrogen receptor positive status [ER+]: Secondary | ICD-10-CM | POA: Diagnosis not present

## 2020-03-29 DIAGNOSIS — Z923 Personal history of irradiation: Secondary | ICD-10-CM | POA: Diagnosis not present

## 2020-03-29 DIAGNOSIS — Z853 Personal history of malignant neoplasm of breast: Secondary | ICD-10-CM | POA: Diagnosis not present

## 2020-03-29 DIAGNOSIS — Z7901 Long term (current) use of anticoagulants: Secondary | ICD-10-CM | POA: Diagnosis not present

## 2020-03-29 DIAGNOSIS — Z86000 Personal history of in-situ neoplasm of breast: Secondary | ICD-10-CM | POA: Insufficient documentation

## 2020-03-29 DIAGNOSIS — I10 Essential (primary) hypertension: Secondary | ICD-10-CM | POA: Diagnosis not present

## 2020-03-29 DIAGNOSIS — E785 Hyperlipidemia, unspecified: Secondary | ICD-10-CM | POA: Insufficient documentation

## 2020-03-29 DIAGNOSIS — Z85828 Personal history of other malignant neoplasm of skin: Secondary | ICD-10-CM | POA: Diagnosis not present

## 2020-03-29 DIAGNOSIS — L299 Pruritus, unspecified: Secondary | ICD-10-CM | POA: Diagnosis not present

## 2020-03-29 DIAGNOSIS — K219 Gastro-esophageal reflux disease without esophagitis: Secondary | ICD-10-CM | POA: Diagnosis not present

## 2020-03-29 DIAGNOSIS — I4891 Unspecified atrial fibrillation: Secondary | ICD-10-CM | POA: Insufficient documentation

## 2020-03-29 DIAGNOSIS — Z510 Encounter for antineoplastic radiation therapy: Secondary | ICD-10-CM | POA: Diagnosis not present

## 2020-03-29 DIAGNOSIS — Z806 Family history of leukemia: Secondary | ICD-10-CM | POA: Diagnosis not present

## 2020-03-29 DIAGNOSIS — D0511 Intraductal carcinoma in situ of right breast: Secondary | ICD-10-CM | POA: Diagnosis not present

## 2020-03-29 DIAGNOSIS — K449 Diaphragmatic hernia without obstruction or gangrene: Secondary | ICD-10-CM | POA: Diagnosis not present

## 2020-03-29 DIAGNOSIS — M8580 Other specified disorders of bone density and structure, unspecified site: Secondary | ICD-10-CM | POA: Diagnosis not present

## 2020-03-29 DIAGNOSIS — Z8051 Family history of malignant neoplasm of kidney: Secondary | ICD-10-CM | POA: Diagnosis not present

## 2020-03-30 ENCOUNTER — Encounter: Payer: Self-pay | Admitting: Radiation Oncology

## 2020-03-30 ENCOUNTER — Other Ambulatory Visit: Payer: Self-pay

## 2020-03-30 ENCOUNTER — Ambulatory Visit: Payer: Medicare PPO

## 2020-03-30 ENCOUNTER — Encounter: Payer: Self-pay | Admitting: *Deleted

## 2020-03-30 ENCOUNTER — Telehealth: Payer: Self-pay | Admitting: Oncology

## 2020-03-30 ENCOUNTER — Ambulatory Visit
Admission: RE | Admit: 2020-03-30 | Discharge: 2020-03-30 | Disposition: A | Discharge status: Home / Self Care | Payer: Medicare PPO | Source: Ambulatory Visit | Attending: Radiation Oncology | Admitting: Radiation Oncology

## 2020-03-30 DIAGNOSIS — Z510 Encounter for antineoplastic radiation therapy: Secondary | ICD-10-CM | POA: Diagnosis not present

## 2020-03-30 DIAGNOSIS — Z170 Estrogen receptor positive status [ER+]: Secondary | ICD-10-CM | POA: Diagnosis not present

## 2020-03-30 DIAGNOSIS — D0511 Intraductal carcinoma in situ of right breast: Secondary | ICD-10-CM | POA: Diagnosis not present

## 2020-03-30 NOTE — Telephone Encounter (Signed)
No 3/8 los. No changes made to pt's schedule.

## 2020-03-31 ENCOUNTER — Ambulatory Visit: Payer: Medicare PPO

## 2020-03-31 ENCOUNTER — Encounter: Payer: Self-pay | Admitting: *Deleted

## 2020-03-31 NOTE — Telephone Encounter (Signed)
Dear Dr. Kelton Pillar,  In follow up to your request, [facility] has researched Medical insurance benefits for therapy with Prolia (denosumab) for your patient Anna Mathews with DOB 1940/03/11  Your patient's [place] advised that Prolia is not covered through your patient's Medical benefit at this time, or that the proposed use of Prolia is not consistent with the payer's policy and therefore is not covered..  If you have any questions about this letter, please call [employer]ist at 1-866-AMG-ASST [phone]) Monday through Friday, 9:00 AM to 8:00 PM [place] time.  Sincerely, [employer]

## 2020-04-01 NOTE — Telephone Encounter (Signed)
Message left for patient to return my call.  

## 2020-04-01 NOTE — Telephone Encounter (Signed)
PLease let the pt her insurance is not covering Prolia and needs an OV with me since she missed a 2021 visit

## 2020-04-04 NOTE — Progress Notes (Signed)
  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   Plan: The patient will receive a call in about one month from the radiation oncology department. She will continue follow up with Dr. Jana Hakim as well.   ________________________________________________    Carola Rhine, [facility]

## 2020-04-04 NOTE — Telephone Encounter (Signed)
Spoken to patient and notified Dr Quin Hoop comments. Verbalized understanding. Patient will call back to schedule an appointment

## 2020-04-07 DIAGNOSIS — H43811 Vitreous degeneration, right eye: Secondary | ICD-10-CM | POA: Diagnosis not present

## 2020-04-07 DIAGNOSIS — H5203 Hypermetropia, bilateral: Secondary | ICD-10-CM | POA: Diagnosis not present

## 2020-04-07 DIAGNOSIS — H2512 Age-related nuclear cataract, left eye: Secondary | ICD-10-CM | POA: Diagnosis not present

## 2020-04-07 DIAGNOSIS — Z961 Presence of intraocular lens: Secondary | ICD-10-CM | POA: Diagnosis not present

## 2020-04-08 ENCOUNTER — Ambulatory Visit: Payer: Medicare PPO | Admitting: Internal Medicine

## 2020-04-25 ENCOUNTER — Other Ambulatory Visit: Payer: Self-pay | Admitting: Interventional Cardiology

## 2020-04-27 ENCOUNTER — Ambulatory Visit (INDEPENDENT_AMBULATORY_CARE_PROVIDER_SITE_OTHER): Payer: Medicare PPO | Admitting: Internal Medicine

## 2020-04-27 ENCOUNTER — Other Ambulatory Visit: Payer: Self-pay

## 2020-04-27 ENCOUNTER — Encounter: Payer: Self-pay | Admitting: Internal Medicine

## 2020-04-27 VITALS — BP 140/60 | HR 60 | Ht 63.0 in | Wt 141.0 lb

## 2020-04-27 DIAGNOSIS — M8580 Other specified disorders of bone density and structure, unspecified site: Secondary | ICD-10-CM | POA: Diagnosis not present

## 2020-04-27 LAB — COMPREHENSIVE METABOLIC PANEL
ALT: 16 U/L (ref 0–35)
AST: 18 U/L (ref 0–37)
Albumin: 4.6 g/dL (ref 3.5–5.2)
Alkaline Phosphatase: 53 U/L (ref 39–117)
BUN: 27 mg/dL — ABNORMAL HIGH (ref 6–23)
CO2: 33 mEq/L — ABNORMAL HIGH (ref 19–32)
Calcium: 9.7 mg/dL (ref 8.4–10.5)
Chloride: 104 mEq/L (ref 96–112)
Creatinine, Ser: 1 mg/dL (ref 0.40–1.20)
GFR: 52.92 mL/min — ABNORMAL LOW (ref >60.00)
Glucose, Bld: 96 mg/dL (ref 70–99)
Potassium: 4.8 mEq/L (ref 3.5–5.1)
Sodium: 142 mEq/L (ref 135–145)
Total Bilirubin: 0.5 mg/dL (ref 0.2–1.2)
Total Protein: 7.2 g/dL (ref 6.0–8.3)

## 2020-04-27 LAB — TSH: TSH: 3.32 u[IU]/mL (ref 0.35–4.50)

## 2020-04-27 LAB — T4, FREE: Free T4: 0.95 ng/dL (ref 0.60–1.60)

## 2020-04-27 LAB — VITAMIN D 25 HYDROXY (VIT D DEFICIENCY, FRACTURES): VITD: 30.1 ng/mL (ref 30.00–100.00)

## 2020-04-27 NOTE — Progress Notes (Signed)
Name: Anna Mathews  MRN/ DOB: [id number], September 16, 1940    Age/ Sex: 80 y.o., female     PCP: Vernie Shanks, MD   Reason for Endocrinology Evaluation: Osteoporosis     Initial Endocrinology Clinic Visit: 08/25/2018    PATIENT IDENTIFIER: [name] is a 80 y.o., female with a past medical history of Breast Ca ( S/P lumpectomy 01/27/2020) ,A.Fib, HTN , dyslipidemia ,and GERD . She has followed with [facility] clinic since 8/3/2020for consultative assistance with management of her Osteoporosis.   HISTORICAL SUMMARY:   Pt was diagnosed with osteopenia:2013  Menarche at age : 14 Menopausal at age : 01/23/1988 Fracture Hx:  Finger fracture from a fall Hx of HRT: no FH of osteoporosis or hip fracture: no Prior Hx of anti-estrogenic therapy :Evista in 2016 Prior Hx of anti-resorptive therapy : Fosmax -worse GERD,Prolia started 01/2017, the second dose was 08/06/2018 In our office she received  Prolia on 03/24/2019,  10/01/2019    Pt has been on anti-resorptive therapy due to a 10-yr FRAX of > 3.0 at the hip   SUBJECTIVE:    Today (04/27/2020):  Ms. Buechele is here for a follow up on Osteoporosis  Prolia  Started in 2019 , in our office she received  Prolia on 03/24/2019,  10/01/2019 Insurance plan did not cover Prolia anymore  She has been having shoulder pain - has been using aspecream   Calcium - vitamin D 600 mg BID    HISTORY:  Past Medical History:  Past Medical History:  Diagnosis Date  . Atrial fibrillation ([place]) 09/2009  . Breast cancer ([place])   . Diverticulosis 09/2006  . GERD (gastroesophageal reflux disease)   . Hiatal hernia   . Hyperlipidemia   . Hypertension   . Osteopenia   . Postmenopausal hormone replacement therapy 1989 - 07/2000   Past Surgical History:  Past Surgical History:  Procedure Laterality Date  . BREAST DUCTAL SYSTEM EXCISION Left 06/2002   duct ectasia with fibrosis - atypical lobular hyperplasia with micro calcifications -  tamoxifen X 28 months then change to Evista 10/06  . BREAST LUMPECTOMY WITH RADIOACTIVE SEED LOCALIZATION Right 01/27/2020   Procedure: RIGHT BREAST LUMPECTOMY WITH RADIOACTIVE SEED LOCALIZATION;  Surgeon: Coralie Keens, MD;  Location: [place];  Service: General;  Laterality: Right;  . BREAST SURGERY Left 4/99   breast biopsy - fibrosis  . CARDIOVERSION N/A 07/16/2017   Procedure: CARDIOVERSION;  Surgeon: Jerline Pain, MD;  Location: [facility] ENDOSCOPY;  Service: Cardiovascular;  Laterality: N/A;  . CARDIOVERSION N/A 10/20/2018   Procedure: CARDIOVERSION;  Surgeon: Buford Dresser, MD;  Location: [place];  Service: Cardiovascular;  Laterality: N/A;  . CATARACT EXTRACTION W/ INTRAOCULAR LENS IMPLANT Right   . CESAREAN SECTION     X 2  . HYSTEROSCOPY  4/95   w/D&C  . TEE WITHOUT CARDIOVERSION N/A 10/20/2018   Procedure: TRANSESOPHAGEAL ECHOCARDIOGRAM (TEE);  Surgeon: Buford Dresser, MD;  Location: [facility] ENDOSCOPY;  Service: Cardiovascular;  Laterality: N/A;   Social History:  reports that she quit smoking about 52 years ago. Her smoking use included cigarettes. She started smoking about 63 years ago. She has a 3.00 pack-year smoking history. She has never used smokeless tobacco. She reports current alcohol use. She reports that she does not use drugs. Family History:  Family History  Problem Relation Age of Onset  . Hypertension Mother   . Heart disease Father   . COPD Father   . Diabetes Maternal Grandfather   .  Diabetes Son   . Rheum arthritis Brother   . Kidney cancer Brother   . Breast cancer Maternal Grandmother   . Diabetes Maternal Aunt   . Diabetes Maternal Aunt      HOME MEDICATIONS: Allergies as of 04/27/2020      Reactions   Penicillins Other (See Comments)   REACTION: "ITCHING IN EYES" Has patient had a PCN reaction causing immediate rash, facial/tongue/throat swelling, SOB or lightheadedness with hypotension: Unknown Has patient had a PCN reaction causing  severe rash involving mucus membranes or skin necrosis: Unknown Has patient had a PCN reaction that required hospitalization: No Has patient had a PCN reaction occurring within the last 10 years: No If all of the above answers are "NO", then may proceed with Cephalosporin use.   Crab [shellfish Allergy] Itching   Full body itching   Sulfa Antibiotics Other (See Comments)   REACTION: " NOT SURE"      Medication List       Accurate as of April 27, 2020  7:12 AM. If you have any questions, ask your nurse or doctor.        acetaminophen 500 MG tablet Commonly known as: TYLENOL Take 500-1,000 mg by mouth every 6 (six) hours as needed (pain.).   amLODipine 5 MG tablet Commonly known as: NORVASC TAKE 1 TABLET BY MOUTH EVERY DAY   aspirin 81 MG chewable tablet Chew 81 mg by mouth daily.   Biotin 5000 MCG Tabs Take 5,000 mcg by mouth daily.   CALCIUM 1200+D3 PO Take 1 tablet by mouth in the morning and at bedtime.   Eliquis 5 MG Tabs tablet Generic drug: apixaban TAKE 1 TABLET BY MOUTH TWICE A DAY What changed: how much to take   Fish Oil 1200 MG Caps Take 1,200 mg by mouth in the morning and at bedtime.   flecainide 150 MG tablet Commonly known as: TAMBOCOR TAKE 1 TABLET BY MOUTH TWICE A DAY   Lubricant Eye Drops 0.4-0.3 % Soln Generic drug: Polyethyl Glycol-Propyl Glycol Place 1 drop into both eyes 3 (three) times daily as needed (dry/irritated eyes).   Prolia 60 MG/ML Sosy injection Generic drug: denosumab INJECT $RemoveBeforeDE'60MG'[id number]$  SUBCUTANEOUSLY  INTO UPPER ARM, THIGH, OR  ABDOMEN EVERY 6 MONTHS  (GIVEN AT PRESCRIBERS  OFFICE) What changed: See the new instructions.   quinapril 40 MG tablet Commonly known as: ACCUPRIL Take 40 mg by mouth daily.   Salonpas 3.01-28-08 % Ptch Generic drug: Camphor-Menthol-Methyl Sal Place 1 patch onto the skin daily as needed (knee/back pain.).   simvastatin 20 MG tablet Commonly known as: ZOCOR Take 20 mg by mouth every evening.    solifenacin 10 MG tablet Commonly known as: VESICARE Take 10 mg by mouth daily.   traMADol 50 MG tablet Commonly known as: ULTRAM Take 1 tablet (50 mg total) by mouth every 6 (six) hours as needed for moderate pain or severe pain.         OBJECTIVE:   PHYSICAL EXAM: VS: BP 140/60   Pulse 60   Ht $R'5\' 3"'ex$  (1.6 m)   Wt 141 lb (64 kg)   LMP 01/23/1988 (Approximate)   SpO2 98%   BMI 24.98 kg/m   EXAM: General: Pt appears well and is in NAD  Neck: General: Supple without adenopathy. Thyroid: Thyroid size normal.  No goiter or nodules appreciated. No thyroid bruit.  Lungs: Clear with good BS bilat with no rales, rhonchi, or wheezes  Heart: Auscultation: RRR.  Abdomen: Normoactive bowel sounds, soft, nontender,  without masses or organomegaly palpable  Extremities:  BL LE: No pretibial edema normal ROM and strength.  Skin: Hair: Texture and amount normal with gender appropriate distribution Skin Inspection: No rashes Skin Palpation: Skin temperature, texture, and thickness normal to palpation  Mental Status: Judgment, insight: Intact Orientation: Oriented to time, place, and person Mood and affect: No depression, anxiety, or agitation     DATA REVIEWED:  Results for LESHA, JAGER (MRN [id number]) as of 04/28/2020 08:16  Ref. Range 04/27/2020 11:57  Sodium Latest Ref Range: 135 - 145 mEq/L 142  Potassium Latest Ref Range: 3.5 - 5.1 mEq/L 4.8  Chloride Latest Ref Range: 96 - 112 mEq/L 104  CO2 Latest Ref Range: 19 - 32 mEq/L 33 (H)  Glucose Latest Ref Range: 70 - 99 mg/dL 96  BUN Latest Ref Range: 6 - 23 mg/dL 27 (H)  Creatinine Latest Ref Range: 0.40 - 1.20 mg/dL 1.00  Calcium Latest Ref Range: 8.4 - 10.5 mg/dL 9.7  Alkaline Phosphatase Latest Ref Range: 39 - 117 U/L 53  Albumin Latest Ref Range: 3.5 - 5.2 g/dL 4.6  AST Latest Ref Range: 0 - 37 U/L 18  ALT Latest Ref Range: 0 - 35 U/L 16  Total Protein Latest Ref Range: 6.0 - 8.3 g/dL 7.2  Total Bilirubin Latest Ref  Range: 0.2 - 1.2 mg/dL 0.5  GFR Latest Ref Range: >60.00 mL/min 52.92 (L)  VITD Latest Ref Range: 30.00 - 100.00 ng/mL 30.10  TSH Latest Ref Range: 0.35 - 4.50 uIU/mL 3.32  T4,Free(Direct) Latest Ref Range: 0.60 - 1.60 ng/dL 0.95     ASSESSMENT / PLAN / RECOMMENDATIONS:   Osteopenia:   -The patient has completed a total of 3 years on Prolia, the reason for treatment is because she met criteria for antiresorptive therapy due to at 10-year FRAX of > 3% at the hips. -She is intolerant to oral bisphosphonates , has been on Evista in 2016. Started prolia in 01/2017, with the last dose 09/2019 -We discussed that we are at the point to consider a drug holiday. -Unfortunately she did not have her DXA scan in June 2021 as initially planned, we will proceed with this now at [facility] mammography. -BMP today is stable -We will proceed with Reclast infusion x1 dose, as her insurance has declined Prolia coverage for the year of 2022  Medications  Continue calcium/vitamin D 600 mg twice daily  Follow-up in 1 year  Signed electronically by: Mack Guise, MD  [facility] Endocrinology  [address] [place], [place] [address] Phone: [phone] FAX: [phone]      CC: Vernie Shanks, [place] [address] Phone: [phone]  Fax: [phone]   Return to Endocrinology clinic as below: Future Appointments  Date Time Provider [place]  04/27/2020 10:50 AM Ashle Stief, Melanie Crazier, MD [facility] None  05/02/2020  8:00 AM [facility] None

## 2020-04-27 NOTE — Patient Instructions (Signed)
-   Continue Calcium tablets twice a day

## 2020-04-28 ENCOUNTER — Telehealth: Payer: Self-pay | Admitting: Internal Medicine

## 2020-04-28 ENCOUNTER — Encounter: Payer: Self-pay | Admitting: Internal Medicine

## 2020-04-28 NOTE — Telephone Encounter (Signed)
Hi Linda,    Can you please schedule this patient for 1 dose of Reclast injection.   She had already received 3 years of Prolia, her last dose was 7 months ago, and her insurance company declined coverage for this year so she will need Reclast ASAP  Thanks a lot  [place], MD  [facility] Endocrinology  [facility] [address] [place], [place] [address] Phone: [phone] FAX: [phone]

## 2020-05-02 ENCOUNTER — Ambulatory Visit
Admission: RE | Admit: 2020-05-02 | Discharge: 2020-05-02 | Disposition: A | Discharge status: Home / Self Care | Payer: Medicare PPO | Source: Ambulatory Visit | Attending: Radiation Oncology | Admitting: Radiation Oncology

## 2020-05-02 ENCOUNTER — Other Ambulatory Visit: Payer: Self-pay

## 2020-05-02 DIAGNOSIS — D0511 Intraductal carcinoma in situ of right breast: Secondary | ICD-10-CM | POA: Insufficient documentation

## 2020-05-02 NOTE — Progress Notes (Signed)
  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 During treatment the patient tolerated radiation therapy relatively well with anticipated skin changes.   Impression/Plan: 1. 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 I was unable to speak with the patient so I left a voicemail and on it, I  discussed that we would be happy to continue to follow her as needed, but she will also continue to follow up with Dr. Jana Hakim in medical oncology. She was counseled on skin care as well as measures to avoid sun exposure to this area.  2. Survivorship. We discussed the importance of survivorship evaluation and encouraged her to attend her upcoming visit with that clinic.     Carola Rhine, PAC

## 2020-05-03 ENCOUNTER — Encounter: Payer: Self-pay | Admitting: Internal Medicine

## 2020-05-03 DIAGNOSIS — M8589 Other specified disorders of bone density and structure, multiple sites: Secondary | ICD-10-CM | POA: Diagnosis not present

## 2020-05-03 DIAGNOSIS — Z780 Asymptomatic menopausal state: Secondary | ICD-10-CM | POA: Diagnosis not present

## 2020-05-04 NOTE — Telephone Encounter (Signed)
Yes, she is good

## 2020-05-04 NOTE — Telephone Encounter (Signed)
Appointment scheduled for reclast on 05/11/20

## 2020-05-11 ENCOUNTER — Other Ambulatory Visit: Payer: Self-pay

## 2020-05-11 ENCOUNTER — Ambulatory Visit (INDEPENDENT_AMBULATORY_CARE_PROVIDER_SITE_OTHER): Payer: Medicare PPO | Admitting: Nutrition

## 2020-05-11 DIAGNOSIS — M8580 Other specified disorders of bone density and structure, unspecified site: Secondary | ICD-10-CM | POA: Diagnosis not present

## 2020-05-12 NOTE — Patient Instructions (Signed)
Drink 4 -5 glasses of water today. Continue to take your calcium and Vit. D as directed by Dr. Kelton Pillar

## 2020-05-12 NOTE — Progress Notes (Signed)
Per Dr. Quin Hoop note on 04/27/20, and after patient signed the consent, an IV was started in her left hand.  28ml of  Normal saline was infused and no signes of redness, swelling or infiltration note.  5mg . Of Zolendronic acid was infused starting at 2:20PM and infused until 2:45PM. Patient denied any dizziness or discomfort at the IV site.  Normal Saline was used to flush the tubing and the IV was the D/Cd at 2:45.  The site showed not signes of redness or swelling.  Patient was encouraged to drink 4 glasses of water today and to continue her calcium and Vit. D per Dr. Quin Hoop orders.  She agreed to do this and had no final questions.

## 2020-05-13 ENCOUNTER — Telehealth: Payer: Self-pay | Admitting: Internal Medicine

## 2020-05-13 ENCOUNTER — Telehealth: Payer: Self-pay | Admitting: Interventional Cardiology

## 2020-05-13 DIAGNOSIS — D2262 Melanocytic nevi of left upper limb, including shoulder: Secondary | ICD-10-CM | POA: Diagnosis not present

## 2020-05-13 DIAGNOSIS — L720 Epidermal cyst: Secondary | ICD-10-CM | POA: Diagnosis not present

## 2020-05-13 DIAGNOSIS — L738 Other specified follicular disorders: Secondary | ICD-10-CM | POA: Diagnosis not present

## 2020-05-13 DIAGNOSIS — L814 Other melanin hyperpigmentation: Secondary | ICD-10-CM | POA: Diagnosis not present

## 2020-05-13 DIAGNOSIS — D2239 Melanocytic nevi of other parts of face: Secondary | ICD-10-CM | POA: Diagnosis not present

## 2020-05-13 DIAGNOSIS — D1801 Hemangioma of skin and subcutaneous tissue: Secondary | ICD-10-CM | POA: Diagnosis not present

## 2020-05-13 DIAGNOSIS — L570 Actinic keratosis: Secondary | ICD-10-CM | POA: Diagnosis not present

## 2020-05-13 DIAGNOSIS — L821 Other seborrheic keratosis: Secondary | ICD-10-CM | POA: Diagnosis not present

## 2020-05-13 NOTE — Telephone Encounter (Signed)
     05/03/2020   T - score  Change from 2018  AP - 0.20 Up 6%   RFN -0.7   Right total hip  -0.8 Up 5%   LFN 1.6   Left total hip -0.4 Up 12%      Abby Nena [place], MD  [facility] Endocrinology  [facility] [address] [place], [place] [address] Phone: [phone] FAX: [phone]

## 2020-05-13 NOTE — Telephone Encounter (Signed)
Spoke with the patient who states that she has been having some pain in her chest, back, and arm for about 2-3 weeks now. She states that she is also having some SOB with exertion. Denies any N/V, dizziness, or diaphoresis.  She states that chest pain is located below her left breast. She reports that the pain is dull. She also reports some indigestion and increased gas. She states that she has been taking pain medications with some alleviation of pain. No current chest pain.  She did have a fall about a month ago but did not have any injuries from it, however she did not follow up with anyone in regards to it. She states pain in her back feels like a strained muscle.  Patient is seeing her PCP next week.

## 2020-05-13 NOTE — Telephone Encounter (Signed)
Pt c/o of Chest Pain: STAT if CP now or developed within 24 hours  1. Are you having CP right now? no  2. Are you experiencing any other symptoms (ex. SOB, nausea, vomiting, sweating)? No,  3. How long have you been experiencing CP? 2 weeks  4. Is your CP continuous or coming and going? Comes and goes  5. Have you taken Nitroglycerin? No   Patient states she has been having pain in her chest, back and left arm for 2 weeks. She states she is not having it now, but sometimes it feels like indigestion. She states it feels like a muscle in her back and right above her breast. She states she also gets SOB when exercising, but is not SOB now.

## 2020-05-15 NOTE — Telephone Encounter (Signed)
Sounds more atypical for cardiac pain.  If sx persist, would get a CTA coronaries.   JV

## 2020-05-16 ENCOUNTER — Encounter: Payer: Self-pay | Admitting: Internal Medicine

## 2020-05-16 NOTE — Telephone Encounter (Signed)
Called patient and reviewed Dr. Hassell Done advice with her. She verbalized understanding and agreement and states she sees her PCP tomorrow. She thanked me for the call.

## 2020-05-17 DIAGNOSIS — E782 Mixed hyperlipidemia: Secondary | ICD-10-CM | POA: Diagnosis not present

## 2020-05-17 DIAGNOSIS — I480 Paroxysmal atrial fibrillation: Secondary | ICD-10-CM | POA: Diagnosis not present

## 2020-05-17 DIAGNOSIS — M8580 Other specified disorders of bone density and structure, unspecified site: Secondary | ICD-10-CM | POA: Diagnosis not present

## 2020-05-17 DIAGNOSIS — D0511 Intraductal carcinoma in situ of right breast: Secondary | ICD-10-CM | POA: Diagnosis not present

## 2020-05-17 DIAGNOSIS — I10 Essential (primary) hypertension: Secondary | ICD-10-CM | POA: Diagnosis not present

## 2020-05-17 DIAGNOSIS — Z7901 Long term (current) use of anticoagulants: Secondary | ICD-10-CM | POA: Diagnosis not present

## 2020-05-17 DIAGNOSIS — M5412 Radiculopathy, cervical region: Secondary | ICD-10-CM | POA: Diagnosis not present

## 2020-05-17 DIAGNOSIS — R9389 Abnormal findings on diagnostic imaging of other specified body structures: Secondary | ICD-10-CM | POA: Diagnosis not present

## 2020-05-17 DIAGNOSIS — R7303 Prediabetes: Secondary | ICD-10-CM | POA: Diagnosis not present

## 2020-05-20 DIAGNOSIS — E782 Mixed hyperlipidemia: Secondary | ICD-10-CM | POA: Diagnosis not present

## 2020-05-20 DIAGNOSIS — R7303 Prediabetes: Secondary | ICD-10-CM | POA: Diagnosis not present

## 2020-05-20 DIAGNOSIS — M8580 Other specified disorders of bone density and structure, unspecified site: Secondary | ICD-10-CM | POA: Diagnosis not present

## 2020-05-20 DIAGNOSIS — I480 Paroxysmal atrial fibrillation: Secondary | ICD-10-CM | POA: Diagnosis not present

## 2020-05-20 DIAGNOSIS — D0511 Intraductal carcinoma in situ of right breast: Secondary | ICD-10-CM | POA: Diagnosis not present

## 2020-05-20 DIAGNOSIS — Z7901 Long term (current) use of anticoagulants: Secondary | ICD-10-CM | POA: Diagnosis not present

## 2020-05-20 DIAGNOSIS — I10 Essential (primary) hypertension: Secondary | ICD-10-CM | POA: Diagnosis not present

## 2020-05-20 DIAGNOSIS — M5412 Radiculopathy, cervical region: Secondary | ICD-10-CM | POA: Diagnosis not present

## 2020-05-20 DIAGNOSIS — R9389 Abnormal findings on diagnostic imaging of other specified body structures: Secondary | ICD-10-CM | POA: Diagnosis not present

## 2020-05-23 ENCOUNTER — Other Ambulatory Visit: Payer: Self-pay | Admitting: Cardiology

## 2020-05-23 NOTE — Telephone Encounter (Signed)
Prescription refill request for Eliquis received. Indication: afib  Last office visit: [place], 10/13/2019 Scr:  04/27/2020, 1.00 Age: 80 yo   Weight: 64 kg   Pt is on the correct dose of Eliquis per dosing criteria, Prescription refill sent for Eliquis 5mg  BID.

## 2020-06-07 ENCOUNTER — Encounter: Payer: Self-pay | Admitting: Interventional Cardiology

## 2020-06-07 ENCOUNTER — Other Ambulatory Visit: Payer: Self-pay

## 2020-06-07 ENCOUNTER — Ambulatory Visit: Payer: Medicare PPO | Admitting: Interventional Cardiology

## 2020-06-07 VITALS — BP 136/64 | HR 65 | Ht 63.0 in | Wt 141.0 lb

## 2020-06-07 DIAGNOSIS — I480 Paroxysmal atrial fibrillation: Secondary | ICD-10-CM | POA: Diagnosis not present

## 2020-06-07 DIAGNOSIS — R0602 Shortness of breath: Secondary | ICD-10-CM

## 2020-06-07 DIAGNOSIS — I10 Essential (primary) hypertension: Secondary | ICD-10-CM | POA: Diagnosis not present

## 2020-06-07 DIAGNOSIS — E782 Mixed hyperlipidemia: Secondary | ICD-10-CM

## 2020-06-07 DIAGNOSIS — R7303 Prediabetes: Secondary | ICD-10-CM | POA: Diagnosis not present

## 2020-06-07 NOTE — Progress Notes (Signed)
Cardiology Office Note   Date:  06/07/2020   ID:  Anna Mathews, DOB 02/15/1940, MRN [id number]  PCP:  Vernie Shanks, MD    No chief complaint on file.  AFib  Wt Readings from Last 3 Encounters:  06/07/20 141 lb (64 kg)  04/27/20 141 lb (64 kg)  03/29/20 142 lb 12.8 oz (64.8 kg)       History of Present Illness: Anna Mathews is a 80 y.o. female   Who has AFib, maintaining NSR on flecainide. We tried a lower dose of flecainide in the past but she had SHOB, so resumed the current dose.   Her husband died in 2015-05-31. She has adjusted well. Shemoved and enjoys her downsized lifestyle.  She had a fall in 5/18 and hit her face and shoulder. Head CT was negative. She has a dislocated right shoulder. It is being managed conservatively. ShedidPT.  In June 2019, she went out of rhythm and had a cardioversion.   She saw Dr. Curt Bears in 2019 and medical therapy was continued.  She has followed with EP since then.  Since the last visit, she has had a lot of stress with her son's illness.  He has an autoimmune illness related to a flu shot 8 years ago.  He went on dialysis, had an amputation.  He worked as a Marine scientist, and has 3 kids.  He died in November 18, 2022.    Never got COVID.  She did get her vaccines.   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  She has some mild SHOB with walking up stairs.    Denies : Chest pain. Dizziness. Leg edema. Nitroglycerin use. Orthopnea. Palpitations. Paroxysmal nocturnal dyspnea.  Syncope.     Past Medical History:  Diagnosis Date  . Atrial fibrillation ([place]) 09/2009  . Breast cancer ([place])   . Diverticulosis 09/2006  . GERD (gastroesophageal reflux disease)   . Hiatal hernia   . Hyperlipidemia   . Hypertension   . Osteopenia   . Postmenopausal hormone replacement therapy 1989 - 07/2000    Past Surgical History:  Procedure Laterality Date  . BREAST DUCTAL SYSTEM EXCISION Left 06/2002   duct ectasia with fibrosis -  atypical lobular hyperplasia with micro calcifications - tamoxifen X 28 months then change to Evista 10/06  . BREAST LUMPECTOMY WITH RADIOACTIVE SEED LOCALIZATION Right 01/27/2020   Procedure: RIGHT BREAST LUMPECTOMY WITH RADIOACTIVE SEED LOCALIZATION;  Surgeon: Coralie Keens, MD;  Location: [place];  Service: General;  Laterality: Right;  . BREAST SURGERY Left 4/99   breast biopsy - fibrosis  . CARDIOVERSION N/A 07/16/2017   Procedure: CARDIOVERSION;  Surgeon: Jerline Pain, MD;  Location: [facility] ENDOSCOPY;  Service: Cardiovascular;  Laterality: N/A;  . CARDIOVERSION N/A 10/20/2018   Procedure: CARDIOVERSION;  Surgeon: Buford Dresser, MD;  Location: [place];  Service: Cardiovascular;  Laterality: N/A;  . CATARACT EXTRACTION W/ INTRAOCULAR LENS IMPLANT Right   . CESAREAN SECTION     X 2  . HYSTEROSCOPY  4/95   w/D&C  . TEE WITHOUT CARDIOVERSION N/A 10/20/2018   Procedure: TRANSESOPHAGEAL ECHOCARDIOGRAM (TEE);  Surgeon: Buford Dresser, MD;  Location: [facility] ENDOSCOPY;  Service: Cardiovascular;  Laterality: N/A;     Current Outpatient Medications  Medication Sig Dispense Refill  . acetaminophen (TYLENOL) 500 MG tablet Take 500-1,000 mg by mouth every 6 (six) hours as needed (pain.).    Marland Kitchen amLODipine (NORVASC) 5 MG tablet TAKE 1 TABLET BY MOUTH EVERY DAY 90 tablet  3  . aspirin 81 MG chewable tablet Chew 81 mg by mouth daily.    . Biotin 5000 MCG TABS Take 5,000 mcg by mouth daily.    . Calcium-Magnesium-Vitamin D (CALCIUM 1200+D3 PO) Take 1 tablet by mouth in the morning and at bedtime.    . Camphor-Menthol-Methyl Sal (SALONPAS) 3.01-28-08 % PTCH Place 1 patch onto the skin daily as needed (knee/back pain.).    Marland Kitchen ELIQUIS 5 MG TABS tablet TAKE 1 TABLET BY MOUTH TWICE A DAY 60 tablet 5  . flecainide (TAMBOCOR) 150 MG tablet TAKE 1 TABLET BY MOUTH TWICE A DAY (Patient taking differently: Take 150 mg by mouth 2 (two) times daily.) 180 tablet 3  . Omega-3 Fatty Acids (FISH OIL) 1200 MG  CAPS Take 1,200 mg by mouth in the morning and at bedtime.    Vladimir Faster Glycol-Propyl Glycol (LUBRICANT EYE DROPS) 0.4-0.3 % SOLN Place 1 drop into both eyes 3 (three) times daily as needed (dry/irritated eyes).    . PROLIA 60 MG/ML SOSY injection INJECT 60MG  SUBCUTANEOUSLY  INTO UPPER ARM, THIGH, OR  ABDOMEN EVERY 6 MONTHS  (GIVEN AT PRESCRIBERS  OFFICE) (Patient taking differently: Inject 60 mg into the skin every 6 (six) months.) 1 mL 1  . quinapril (ACCUPRIL) 40 MG tablet Take 40 mg by mouth daily.     . simvastatin (ZOCOR) 20 MG tablet Take 20 mg by mouth every evening.     . solifenacin (VESICARE) 10 MG tablet Take 10 mg by mouth daily.    . traMADol (ULTRAM) 50 MG tablet Take 1 tablet (50 mg total) by mouth every 6 (six) hours as needed for moderate pain or severe pain. 20 tablet 0   No current facility-administered medications for this visit.    Allergies:   Penicillins, Atorvastatin, Crab [shellfish allergy], and Sulfa antibiotics    Social History:  The patient  reports that she quit smoking about 52 years ago. Her smoking use included cigarettes. She started smoking about 63 years ago. She has a 3.00 pack-year smoking history. She has never used smokeless tobacco. She reports current alcohol use. She reports that she does not use drugs.   Family History:  [name] family history includes Breast cancer in her maternal grandmother; COPD in her father; Diabetes in her maternal aunt, maternal aunt, maternal grandfather, and son; Heart disease in her father; Hypertension in her mother; Kidney cancer in her brother; Rheum arthritis in her brother.    ROS:  Please see the history of present illness.   Otherwise, review of systems are positive for arthritis pain.   All other systems are reviewed and negative.    PHYSICAL EXAM: VS:  BP 136/64   Pulse 65   Ht 5\' 3"  (1.6 m)   Wt 141 lb (64 kg)   LMP 01/23/1988 (Approximate)   SpO2 92%   BMI 24.98 kg/m  , BMI Body mass index is  24.98 kg/m. GEN: Well nourished, well developed, in no acute distress  HEENT: normal  Neck: no JVD, carotid bruits, or masses Cardiac: RRR; 2/6 systolic murmur, no rubs, or gallops,no edema  Respiratory:  clear to auscultation bilaterally, normal work of breathing GI: soft, nontender, nondistended, + BS MS: no deformity or atrophy  Skin: warm and dry, no rash Neuro:  Strength and sensation are intact Psych: euthymic mood, full affect   EKG:   The ekg ordered today demonstrates NSR, RBBB   Recent Labs: 01/20/2020: Hemoglobin 13.3; Platelets 227 04/27/2020: ALT 16; BUN 27;  Creatinine, Ser 1.00; Potassium 4.8; Sodium 142; TSH 3.32   Lipid Panel No results found for: CHOL, TRIG, HDL, CHOLHDL, VLDL, LDLCALC, LDLDIRECT   Other studies Reviewed: Additional studies/ records that were reviewed today with results demonstrating: labs results.   ASSESSMENT AND PLAN:  1. PAF: Flecainide for rhythm control. Eliquis for stroke prevention.  Check CBC today.   2. HTN: The current medical regimen is effective;  continue present plan and medications. 3. Hyperlipidemia: lipids well controlled on simvasttin.  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  5. DOE: Check BNP.  If sx persist, consider echo.  She is trying to increase activity in the summer months.      Current medicines are reviewed at length with the patient today.  The patient concerns regarding her medicines were addressed.  The following changes have been made:  No change  Labs/ tests ordered today include:  No orders of the defined types were placed in this encounter.   Recommend 150 minutes/week of aerobic exercise Low fat, low carb, high fiber diet recommended  Disposition:   FU in 1 year   Signed, Larae Grooms, MD  06/07/2020 3:13 PM    [facility] [address], [place], [place]  [address] Phone: [phone]; Fax: [phone]

## 2020-06-07 NOTE — Patient Instructions (Signed)
Medication Instructions:  Your physician recommends that you continue on your current medications as directed. Please refer to the Current Medication list given to you today.  *If you need a refill on your cardiac medications before your next appointment, please call your pharmacy*   Lab Work: Lab work to be done today--CBC and BNP If you have labs (blood work) drawn today and your tests are completely normal, you will receive your results only by: Marland Kitchen MyChart Message (if you have MyChart) OR . A paper copy in the mail If you have any lab test that is abnormal or we need to change your treatment, we will call you to review the results.   Testing/Procedures: none   Follow-Up: At [facility], you and your health needs are our priority.  As part of our continuing mission to provide you with exceptional heart care, we have created designated Provider Care Teams.  These Care Teams include your primary Cardiologist (physician) and Advanced Practice Providers (APPs -  Physician Assistants and Nurse Practitioners) who all work together to provide you with the care you need, when you need it.  We recommend signing up for the patient portal called "MyChart".  Sign up information is provided on this After Visit Summary.  MyChart is used to connect with patients for Virtual Visits (Telemedicine).  Patients are able to view lab/test results, encounter notes, upcoming appointments, etc.  Non-urgent messages can be sent to your provider as well.   To learn more about what you can do with MyChart, go to [email or web address].    Your next appointment:   12 month(s)  The format for your next appointment:   In Person  Provider:   You may see Larae Grooms, MD or one of the following Advanced Practice Providers on your designated Care Team:    Melina Copa, PA-C  Ermalinda Barrios, PA-C    Other Instructions Please schedule follow up with Dr Curt Bears in a couple of months

## 2020-06-08 LAB — CBC
Hematocrit: 37.1 % (ref 34.0–46.6)
Hemoglobin: 12.7 g/dL (ref 11.1–15.9)
MCH: 31.1 pg (ref 26.6–33.0)
MCHC: 34.2 g/dL (ref 31.5–35.7)
MCV: 91 fL (ref 79–97)
Platelets: 222 10*3/uL (ref 150–450)
RBC: 4.08 x10E6/uL (ref 3.77–5.28)
RDW: 12.2 % (ref 11.7–15.4)
WBC: 6 10*3/uL (ref 3.4–10.8)

## 2020-06-08 LAB — PRO B NATRIURETIC PEPTIDE: NT-Pro BNP: 309 pg/mL (ref 0–738)

## 2020-06-24 ENCOUNTER — Other Ambulatory Visit: Payer: Self-pay | Admitting: Cardiology

## 2020-07-04 ENCOUNTER — Telehealth: Payer: Self-pay | Admitting: Interventional Cardiology

## 2020-07-04 DIAGNOSIS — R0602 Shortness of breath: Secondary | ICD-10-CM

## 2020-07-04 NOTE — Telephone Encounter (Signed)
Pt states she has had intermittent SOB for about 2 months that had improved when she seen Dr. Irish Lack on 5/17 but has worsened again over the last two weeks.  Denies swelling, CP, HA or lightheadedness.  Occasionally has some dizziness.  Vitals today were 115/57, 57 and 111/57, 63.  SOB occurs on exertion.  No issues when sitting still.  Pt states Dr. Irish Lack told her to call if SOB worsens.  Will route to Dr. Irish Lack for review and advisement.

## 2020-07-04 NOTE — Telephone Encounter (Signed)
Pt c/o Shortness Of Breath: STAT if SOB developed within the last 24 hours or pt is noticeably SOB on the phone  1. Are you currently SOB (can you hear that pt is SOB on the phone)? NO  2. How long have you been experiencing SOB? 2 weeks ago   3. Are you SOB when sitting or when up moving around? When moving around  4. Are you currently experiencing any other symptoms? No   PT is staing that the doctor told her to call if she has any other experiences of SOB

## 2020-07-05 NOTE — Telephone Encounter (Signed)
OK to order echo for shortness of breath.    JV  ----- Message -----  From: Loren Racer, RN  Sent: 07/04/2020   4:33 PM EDT  To: Jettie Booze, MD, *    Left message to call back.  Order in.

## 2020-07-05 NOTE — Telephone Encounter (Signed)
Pt returning phone call, please advise

## 2020-07-05 NOTE — Telephone Encounter (Signed)
Spoke with pt and made her aware of echo order.  Echo scheduled.  Pt appreciative for call.

## 2020-07-06 DIAGNOSIS — R140 Abdominal distension (gaseous): Secondary | ICD-10-CM | POA: Diagnosis not present

## 2020-07-06 DIAGNOSIS — K921 Melena: Secondary | ICD-10-CM | POA: Diagnosis not present

## 2020-07-06 DIAGNOSIS — R1032 Left lower quadrant pain: Secondary | ICD-10-CM | POA: Diagnosis not present

## 2020-07-08 ENCOUNTER — Other Ambulatory Visit: Payer: Self-pay

## 2020-07-08 ENCOUNTER — Ambulatory Visit (HOSPITAL_COMMUNITY): Payer: Medicare PPO | Attending: Cardiology

## 2020-07-08 DIAGNOSIS — R0602 Shortness of breath: Secondary | ICD-10-CM | POA: Diagnosis not present

## 2020-07-08 LAB — ECHOCARDIOGRAM COMPLETE
Area-P 1/2: 2.6 cm2
S' Lateral: 2.6 cm

## 2020-08-11 ENCOUNTER — Ambulatory Visit: Payer: Medicare PPO | Admitting: Cardiology

## 2020-08-11 ENCOUNTER — Encounter: Payer: Self-pay | Admitting: Cardiology

## 2020-08-11 ENCOUNTER — Other Ambulatory Visit: Payer: Self-pay

## 2020-08-11 VITALS — BP 162/68 | HR 61 | Ht 63.5 in | Wt 143.0 lb

## 2020-08-11 DIAGNOSIS — I480 Paroxysmal atrial fibrillation: Secondary | ICD-10-CM

## 2020-08-11 NOTE — Progress Notes (Signed)
Electrophysiology Office Note   Date:  08/11/2020   ID:  Anna Mathews, DOB 1940/03/16, MRN [id number]  PCP:  Vernie Shanks, MD  Cardiologist:  Casandra Doffing Primary Electrophysiologist:  Danyel Griess Meredith Leeds, MD    No chief complaint on file.    History of Present Illness: Anna Mathews is a 80 y.o. female who is being seen today for the evaluation of atrial fibrillation at the request of Ermalinda Barrios. Presenting today for electrophysiology evaluation.    She has a history significant for atrial fibrillation on flecainide, hypertension, and hyperlipidemia.  She had a colonoscopy and was noted to be in atrial fibrillation.  She is more short of breath with activity.  She also has quite a bit of fatigue and palpitations.  She was thus put on flecainide and is felt much improved.  Today, denies symptoms of palpitations, chest pain, shortness of breath, orthopnea, PND, lower extremity edema, claudication, dizziness, presyncope, syncope, bleeding, or neurologic sequela. The patient is tolerating medications without difficulties.  Since being seen she has done well.  She has no chest pain or shortness of breath.  She is able do all her daily activities without restriction.  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   Past Medical History:  Diagnosis Date   Atrial fibrillation ([place]) 09/2009   Breast cancer ([place])    Diverticulosis 09/2006   GERD (gastroesophageal reflux disease)    Hiatal hernia    Hyperlipidemia    Hypertension    Osteopenia    Postmenopausal hormone replacement therapy 1989 - 07/2000   Past Surgical History:  Procedure Laterality Date   BREAST DUCTAL SYSTEM EXCISION Left 06/2002   duct ectasia with fibrosis - atypical lobular hyperplasia with micro calcifications - tamoxifen X 28 months then change to Evista 10/06   BREAST LUMPECTOMY WITH RADIOACTIVE SEED LOCALIZATION Right 01/27/2020   Procedure: RIGHT BREAST LUMPECTOMY WITH RADIOACTIVE SEED LOCALIZATION;   Surgeon: Coralie Keens, MD;  Location: [facility];  Service: General;  Laterality: Right;   BREAST SURGERY Left 4/99   breast biopsy - fibrosis   CARDIOVERSION N/A 07/16/2017   Procedure: CARDIOVERSION;  Surgeon: Jerline Pain, MD;  Location: [facility];  Service: Cardiovascular;  Laterality: N/A;   CARDIOVERSION N/A 10/20/2018   Procedure: CARDIOVERSION;  Surgeon: Buford Dresser, MD;  Location: [facility];  Service: Cardiovascular;  Laterality: N/A;   CATARACT EXTRACTION W/ INTRAOCULAR LENS IMPLANT Right    CESAREAN SECTION     X 2   HYSTEROSCOPY  4/95   w/D&C   TEE WITHOUT CARDIOVERSION N/A 10/20/2018   Procedure: TRANSESOPHAGEAL ECHOCARDIOGRAM (TEE);  Surgeon: Buford Dresser, MD;  Location: [facility];  Service: Cardiovascular;  Laterality: N/A;     Current Outpatient Medications  Medication Sig Dispense Refill   acetaminophen (TYLENOL) 500 MG tablet Take 500-1,000 mg by mouth every 6 (six) hours as needed (pain.).     amLODipine (NORVASC) 5 MG tablet TAKE 1 TABLET BY MOUTH EVERY DAY 90 tablet 3   aspirin 81 MG chewable tablet Chew 81 mg by mouth daily.     Biotin 5000 MCG TABS Take 5,000 mcg by mouth daily.     Calcium-Magnesium-Vitamin D (CALCIUM 1200+D3 PO) Take 1 tablet by mouth in the morning and at bedtime.     Camphor-Menthol-Methyl Sal (SALONPAS) 3.01-28-08 % PTCH Place 1 patch onto the skin daily as needed (knee/back pain.).     ELIQUIS 5 MG TABS tablet TAKE 1 TABLET BY MOUTH TWICE A DAY  60 tablet 5   flecainide (TAMBOCOR) 150 MG tablet Take 1 tablet (150 mg total) by mouth 2 (two) times daily. Please keep upcoming appt in July 2022 with Dr. Curt Bears before anymore refills. Thank you 180 tablet 0   Omega-3 Fatty Acids (FISH OIL) 1200 MG CAPS Take 1,200 mg by mouth in the morning and at bedtime.     Polyethyl Glycol-Propyl Glycol (LUBRICANT EYE DROPS) 0.4-0.3 % SOLN Place 1 drop into both eyes 3 (three) times daily as needed (dry/irritated eyes).     PROLIA 60  MG/ML SOSY injection INJECT 60MG  SUBCUTANEOUSLY  INTO UPPER ARM, THIGH, OR  ABDOMEN EVERY 6 MONTHS  (GIVEN AT PRESCRIBERS  OFFICE) (Patient taking differently: Inject 60 mg into the skin every 6 (six) months.) 1 mL 1   quinapril (ACCUPRIL) 40 MG tablet Take 40 mg by mouth daily.      simvastatin (ZOCOR) 20 MG tablet Take 20 mg by mouth every evening.      solifenacin (VESICARE) 10 MG tablet Take 10 mg by mouth daily.     traMADol (ULTRAM) 50 MG tablet Take 1 tablet (50 mg total) by mouth every 6 (six) hours as needed for moderate pain or severe pain. 20 tablet 0   No current facility-administered medications for this visit.    Allergies:   Penicillins, Atorvastatin, Crab [shellfish allergy], and Sulfa antibiotics   Social History:  The patient  reports that she quit smoking about 52 years ago. Her smoking use included cigarettes. She started smoking about 63 years ago. She has a 3.00 pack-year smoking history. She has never used smokeless tobacco. She reports current alcohol use. She reports that she does not use drugs.   Family History:  [name] family history includes Breast cancer in her maternal grandmother; COPD in her father; Diabetes in her maternal aunt, maternal aunt, maternal grandfather, and son; Heart disease in her father; Hypertension in her mother; Kidney cancer in her brother; Rheum arthritis in her brother.   ROS:  Please see the history of present illness.   Otherwise, review of systems is positive for none.   All other systems are reviewed and negative.   PHYSICAL EXAM: VS:  BP (!) 162/68   Pulse 61   Ht 5' 3.5" (1.613 m)   Wt 143 lb (64.9 kg)   LMP 01/23/1988 (Approximate)   BMI 24.93 kg/m  , BMI Body mass index is 24.93 kg/m. GEN: Well nourished, well developed, in no acute distress  HEENT: normal  Neck: no JVD, carotid bruits, or masses Cardiac: RRR; no murmurs, rubs, or gallops,no edema  Respiratory:  clear to auscultation bilaterally, normal work of  breathing GI: soft, nontender, nondistended, + BS MS: no deformity or atrophy  Skin: warm and dry Neuro:  Strength and sensation are intact Psych: euthymic mood, full affect  EKG:  EKG is ordered today. Personal review of the ekg ordered shows sinus rhythm, IVCD, rate 61  Recent Labs: 04/27/2020: ALT 16; BUN 27; Creatinine, Ser 1.00; Potassium 4.8; Sodium 142; TSH 3.32 06/07/2020: Hemoglobin 12.7; NT-Pro BNP 309; Platelets 222    Lipid Panel  No results found for: CHOL, TRIG, HDL, CHOLHDL, VLDL, LDLCALC, LDLDIRECT   Wt Readings from Last 3 Encounters:  08/11/20 143 lb (64.9 kg)  06/07/20 141 lb (64 kg)  04/27/20 141 lb (64 kg)      Other studies Reviewed: Additional studies/ records that were reviewed today include: TTE 2014  Review of the above records today demonstrates:  LVEF 60  to 65%, mild MR, aortic valve is sclerotic but opens well, impaired relaxation consistent with age.    ASSESSMENT AND PLAN:  1.  Paroxysmal atrial fibrillation/flutter: Currently on flecainide, Eliquis, Toprol-XL.  CHA2DS2-VASc of 4.  High risk medication monitoring.  Currently well controlled.  She has had no further episodes.  No changes.  2.  Hypertension: Elevated today but usually well controlled at home.  No changes.  3.  Hyperlipidemia: Continue Zocor.  Current medicines are reviewed at length with the patient today.   The patient does not have concerns regarding her medicines.  The following changes were made today: None  Labs/ tests ordered today include:  Orders Placed This Encounter  Procedures   EKG 12-Lead    Disposition:   FU with Stoy Fenn 6 months  Signed, Hriday Stai Meredith Leeds, MD  08/11/2020 2:42 PM     [facility] [address] [place] [phone] (office) [phone] (fax)

## 2020-08-22 DIAGNOSIS — Z862 Personal history of diseases of the blood and blood-forming organs and certain disorders involving the immune mechanism: Secondary | ICD-10-CM

## 2020-08-22 HISTORY — DX: Personal history of diseases of the blood and blood-forming organs and certain disorders involving the immune mechanism: Z86.2

## 2020-09-19 DIAGNOSIS — D1801 Hemangioma of skin and subcutaneous tissue: Secondary | ICD-10-CM | POA: Diagnosis not present

## 2020-09-19 DIAGNOSIS — L578 Other skin changes due to chronic exposure to nonionizing radiation: Secondary | ICD-10-CM | POA: Diagnosis not present

## 2020-09-19 DIAGNOSIS — L817 Pigmented purpuric dermatosis: Secondary | ICD-10-CM | POA: Diagnosis not present

## 2020-09-19 DIAGNOSIS — R233 Spontaneous ecchymoses: Secondary | ICD-10-CM | POA: Diagnosis not present

## 2020-09-20 DIAGNOSIS — I10 Essential (primary) hypertension: Secondary | ICD-10-CM | POA: Diagnosis not present

## 2020-09-20 DIAGNOSIS — E782 Mixed hyperlipidemia: Secondary | ICD-10-CM | POA: Diagnosis not present

## 2020-09-20 DIAGNOSIS — D0511 Intraductal carcinoma in situ of right breast: Secondary | ICD-10-CM | POA: Diagnosis not present

## 2020-09-20 DIAGNOSIS — M8580 Other specified disorders of bone density and structure, unspecified site: Secondary | ICD-10-CM | POA: Diagnosis not present

## 2020-09-20 DIAGNOSIS — L817 Pigmented purpuric dermatosis: Secondary | ICD-10-CM | POA: Diagnosis not present

## 2020-09-20 DIAGNOSIS — Z7901 Long term (current) use of anticoagulants: Secondary | ICD-10-CM | POA: Diagnosis not present

## 2020-09-20 DIAGNOSIS — R7303 Prediabetes: Secondary | ICD-10-CM | POA: Diagnosis not present

## 2020-09-20 DIAGNOSIS — I480 Paroxysmal atrial fibrillation: Secondary | ICD-10-CM | POA: Diagnosis not present

## 2020-09-27 ENCOUNTER — Telehealth: Payer: Self-pay | Admitting: Interventional Cardiology

## 2020-09-27 DIAGNOSIS — I10 Essential (primary) hypertension: Secondary | ICD-10-CM

## 2020-09-27 MED ORDER — CHLORTHALIDONE 25 MG PO TABS: 25.0000 mg | ORAL_TABLET | Freq: Every day | ORAL | 3 refills | Status: DC

## 2020-09-27 NOTE — Addendum Note (Signed)
Addended by: Rodman Key on: 09/27/2020 02:42 PM   Modules accepted: Orders

## 2020-09-27 NOTE — Telephone Encounter (Signed)
Called pt back in regards to possible medication reaction.   She reports that she has had what she thought was some type of bite for 3 weeks.  It started on her legs, moved to her arms and now is on her chest.  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  She read a study where a pt with this disease was on amlodipine and developed a reaction years after being on the medication.  She would like to know if this could possibly be occurring with her.  BP today 165/77 she reports that she feels flustered at this time may be contributing to high BP; has not taken amlodipine today.  Will route to pharm d and MD to address.

## 2020-09-27 NOTE — Telephone Encounter (Signed)
Reviewed information from [facility] with the patient. She has not taken amlodipine today yet and plans to stop it.  Dermatology dx her on 09/23/20 and yesterday her friend is who brought it to her attention that amlodipine could be the cause or exacerbate it.  She would like to start chlorthalidone 25 mg daily.  Will start tomorrow and will come to office on 9/21 for BMET.  Appointment made.  Pt aware that if Dr. Irish Lack has further recommendations when he returns after this week we will call her back.  She is very Patent attorney for assistance.

## 2020-09-27 NOTE — Telephone Encounter (Signed)
Schamberg's disease is the most common cause of capillaritis and diagnosed by biopsy. Given that calcium channel blockers cause peripheral vasodilation, cannot rule out amlodipine as the cause. There is a case report from 2015 that a patient developed Schamberg's disease 3 months after starting amlodipine that resolved after being stopped for 7 months and recurred once restarted. Another case report from 2017 details a similar story but resolved after 1 month of discontinuing amlodipine. The cause appears to be generally idiopathic. It also appears to occur primarily on the lower extremities. The patient is describing an itchy rash on legs that have spread to arms, chest, and face. The above case studies recommend early discontinuation of amlodipine in patients diagnosed with Schamberg's disease to prevent progression of skin lesions. Could consider holding amlodipine, though based on case reports it may take >1 month and upwards of 6 months for lesions to resolve if amlodipine is the cause.   If she wants to stop the amlodipine, recommend starting chlorthalidone 25 mg daily for BP lowering. Will need to check a bmet after 2 weeks of starting.

## 2020-09-27 NOTE — Telephone Encounter (Signed)
Pt c/o medication issue:  1. Name of Medication: amLODipine (NORVASC) 5 MG tablet  2. How are you currently taking this medication (dosage and times per day)? 1 tablet daily  3. Are you having a reaction (difficulty breathing--STAT)? no  4. What is your medication issue? Patient states she started getting a rash about 10 days ago. She says she has little bumps all over and they itch very bad. She states she feels like she is "on fire". She says she went to her dermatologist and she was diagnosed with schamberg's disease. She states her friend looked it up and saw that amlodipine can cause it. She states she has been on the medication for year. She states her rash started on her legs and has spread to her arms, chest, and her face now. She states she is not having any other symptoms.

## 2020-09-28 ENCOUNTER — Other Ambulatory Visit: Payer: Self-pay | Admitting: Cardiology

## 2020-09-29 NOTE — Telephone Encounter (Signed)
I agree with changing from amlodipine to chlorthalidone and checking BMet.   Jettie Booze, MD

## 2020-09-30 ENCOUNTER — Telehealth: Payer: Self-pay | Admitting: Cardiology

## 2020-09-30 DIAGNOSIS — L817 Pigmented purpuric dermatosis: Secondary | ICD-10-CM | POA: Diagnosis not present

## 2020-09-30 DIAGNOSIS — R21 Rash and other nonspecific skin eruption: Secondary | ICD-10-CM | POA: Diagnosis not present

## 2020-09-30 NOTE — Telephone Encounter (Signed)
Left message to call back  

## 2020-09-30 NOTE — Telephone Encounter (Signed)
Pt is returning call from earlier today! 

## 2020-09-30 NOTE — Telephone Encounter (Signed)
Discussed w/ pt. She is going to give more time to the med change she just did couple days ago...Marland KitchenMarland KitchenMarland Kitchen pt had rash in past on Amlodipine in the past that resolved after stopping it. Pt aware it may take a long time for resolution.  Aware that w/ hx of this occurring we would not consider stopping Flecainide/Eliquis just yet. Pt agreeable and will follow up if no improvement and they think next step is to consider switching antiarrythmic's. Patient verbalized understanding and agreeable to plan.

## 2020-09-30 NOTE — Telephone Encounter (Signed)
Forwarding to pharmD for review...Marland KitchenMarland Kitchen

## 2020-09-30 NOTE — Telephone Encounter (Signed)
Patient is returning call.  °

## 2020-09-30 NOTE — Telephone Encounter (Signed)
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 She wonder if it might be a side effect from one of her medicine.  It started on her lower leg and now it is all over her body.   Pt c/o medication issue:  1. Name of Medication: Eliquis , Chlorthalidone- and Flecainide  2. How are you currently taking this medication (dosage and times per day)? 2 times a day  3. Are you having a reaction (difficulty breathing--STAT)?   4. What is your medication issue?this rash started on her lower leg, now all over her body and body feels like it is on fire- started 3 weeks ago

## 2020-09-30 NOTE — Telephone Encounter (Signed)
See other phone note from 9/6 for more detailed PharmD response. She was on amlodipine which can contribute to Schamberg purpura. Meds she listed do not have any known associated with Schamberg, although flecainide does have 1-3% reported incidence of skin rash and could potentially be contributing. Aspirin and acetaminophen have been known to cause cases of Schamberg as well and she does take aspirin. Would need MD clearance before stopping either of these meds though.

## 2020-10-03 ENCOUNTER — Telehealth: Payer: Self-pay | Admitting: Interventional Cardiology

## 2020-10-03 NOTE — Telephone Encounter (Signed)
Pt c/o medication issue:  1. Name of Medication: chlorthalidone (HYGROTON) 25 MG tablet  2. How are you currently taking this medication (dosage and times per day)? Take 1 tablet (25 mg total) by mouth daily.  3. Are you having a reaction (difficulty breathing--STAT)? NO  4. What is your medication issue?  Itching burning red blood cell all over her body  Pt wants Dr. Irish Lack to see it for himself since it was prescribed by heart care

## 2020-10-03 NOTE — Telephone Encounter (Signed)
I spoke with patient. She reports rash has now spread to all over her body.  Is not present all day but does happen 4-5 times per day.  It is a burning, red rash.  Rash was present before starting chlorthalidone.  She took chlorthalidone 2 days and did fine with it.  BP yesterday was 122/68 after taking chlorthalidone.  After yesterday's medications she felt wiped out for an hour.  After an hour she felt better.  She is concerned about taking chlorthalidone today.  I advised her to try and take today as she did fine with it for 2 days.  I asked her to hold today's dose if BP less than 110/60.  Instructed to stay hydrated and change positions slowly.  I asked her to call us back if she felt wiped out after taking chlorthalidone today.  She is going to follow up with dermatology regarding her rash next week.

## 2020-10-04 NOTE — Telephone Encounter (Signed)
Patient states she did not take the medication yesterday morning and yesterday did not have as much itching. She states she could at least walk to her car and drive. Today she says she took the medication with breakfast and could not even get off of the sofa, was dizzy and could not walk. She states she was breaking out all over. She would like to know if she can stop taking the medication for a while, because she says it is doing something to her body. She states she has to have someone see what it looks like. She states it also stings and burns. She says she is also keeping a journal of her symptoms. She says she is not sure what to do.

## 2020-10-04 NOTE — Telephone Encounter (Signed)
Left a message to call back.

## 2020-10-04 NOTE — Telephone Encounter (Signed)
Patient returning call.

## 2020-10-05 MED ORDER — CHLORTHALIDONE 25 MG PO TABS: 12.5000 mg | ORAL_TABLET | Freq: Every day | ORAL | 3 refills | Status: DC

## 2020-10-05 NOTE — Telephone Encounter (Signed)
See earlier phone notes on 9/6 and 9/9 for other calls in about her rash. Rash started before she began on chlorthalidone. She was changed from amlodipine to chlorthalidone because she initially thought amlodipine was causing her rash.  Based on below message, main pt complaint with chlorthalidone seems to be dizziness in which case she could cut the chlorthalidone in half and take 1/2 tab daily to see if she tolerates that better. If she stops chlorthalidone, anticipate that another BP med may be needed as this initially replaced her amlodipine. Next option would be low dose spironolactone with BMET check 2 weeks later.  Dermatology would be better equipped to evaluate her rash than cardiology would. She has only been off the amlodipine for ~1 week so symptoms may take time to improve if amlodipine was the cause. If they don't and another med change is needed, see below note from my prior message about other potential med changes:  Flecainide has a 1-3% reported incidence of skin rash and could potentially be contributing. Aspirin and acetaminophen have been known to cause cases of Schamberg as well and she does take aspirin. Would need MD clearance before stopping either of these meds though.

## 2020-10-05 NOTE — Addendum Note (Signed)
Addended by: Thompson Grayer on: 10/05/2020 09:48 AM   Modules accepted: Orders

## 2020-10-05 NOTE — Telephone Encounter (Signed)
Patient was returning a call she got yesterday from one of the Nurses. Please call back

## 2020-10-05 NOTE — Telephone Encounter (Signed)
I spoke with patient and gave her information from pharmacist.  Last evening at 11 PM BP was 93/53 and then 75/50.  This AM BP is 125/83.  This is the first time she has had readings this low in the evening.  I advised her to drink water if BP this low again.  She will let us know if continues to run low in the evening. Patient will decrease chlorthalidone to half tablet daily. She will let us know if symptoms of dizziness and feeling wiped out do not improve with lower dose.  Patient will follow up with dermatology for rash.

## 2020-10-07 ENCOUNTER — Telehealth: Payer: Self-pay | Admitting: Interventional Cardiology

## 2020-10-07 NOTE — Telephone Encounter (Signed)
Left message for patient to call back  

## 2020-10-07 NOTE — Telephone Encounter (Signed)
Pt c/o medication issue:  1. Name of Medication:  chlorthalidone (HYGROTON) 25 MG tablet  2. How are you currently taking this medication (dosage and times per day)? 1/2 tablet in the morning   3. Are you having a reaction (difficulty breathing--STAT)? Yes  4. What is your medication issue? Anna Mathews is calling stating this medication makes her drowsy barely able to get up. Due to this she is wanting to know if she can take it at night versus the morning.

## 2020-10-10 DIAGNOSIS — R110 Nausea: Secondary | ICD-10-CM | POA: Diagnosis not present

## 2020-10-10 DIAGNOSIS — R21 Rash and other nonspecific skin eruption: Secondary | ICD-10-CM | POA: Diagnosis not present

## 2020-10-12 ENCOUNTER — Encounter (HOSPITAL_BASED_OUTPATIENT_CLINIC_OR_DEPARTMENT_OTHER): Payer: Self-pay | Admitting: *Deleted

## 2020-10-12 ENCOUNTER — Emergency Department (HOSPITAL_BASED_OUTPATIENT_CLINIC_OR_DEPARTMENT_OTHER)
Admission: EM | Admit: 2020-10-12 | Discharge: 2020-10-12 | Disposition: A | Discharge status: Home / Self Care | Payer: Medicare PPO | Attending: Emergency Medicine | Admitting: Emergency Medicine

## 2020-10-12 ENCOUNTER — Other Ambulatory Visit: Payer: Self-pay

## 2020-10-12 ENCOUNTER — Emergency Department (HOSPITAL_BASED_OUTPATIENT_CLINIC_OR_DEPARTMENT_OTHER): Payer: Medicare PPO

## 2020-10-12 DIAGNOSIS — R21 Rash and other nonspecific skin eruption: Secondary | ICD-10-CM | POA: Diagnosis not present

## 2020-10-12 DIAGNOSIS — R110 Nausea: Secondary | ICD-10-CM | POA: Insufficient documentation

## 2020-10-12 DIAGNOSIS — R112 Nausea with vomiting, unspecified: Secondary | ICD-10-CM | POA: Diagnosis not present

## 2020-10-12 DIAGNOSIS — Z7901 Long term (current) use of anticoagulants: Secondary | ICD-10-CM | POA: Insufficient documentation

## 2020-10-12 DIAGNOSIS — Z87891 Personal history of nicotine dependence: Secondary | ICD-10-CM | POA: Diagnosis not present

## 2020-10-12 DIAGNOSIS — R1013 Epigastric pain: Secondary | ICD-10-CM | POA: Insufficient documentation

## 2020-10-12 DIAGNOSIS — Z853 Personal history of malignant neoplasm of breast: Secondary | ICD-10-CM | POA: Diagnosis not present

## 2020-10-12 DIAGNOSIS — Z7982 Long term (current) use of aspirin: Secondary | ICD-10-CM | POA: Insufficient documentation

## 2020-10-12 DIAGNOSIS — I10 Essential (primary) hypertension: Secondary | ICD-10-CM | POA: Insufficient documentation

## 2020-10-12 DIAGNOSIS — Z79899 Other long term (current) drug therapy: Secondary | ICD-10-CM | POA: Insufficient documentation

## 2020-10-12 DIAGNOSIS — R109 Unspecified abdominal pain: Secondary | ICD-10-CM | POA: Diagnosis not present

## 2020-10-12 LAB — CBC WITH DIFFERENTIAL/PLATELET
Abs Immature Granulocytes: 0.05 10*3/uL (ref 0.00–0.07)
Basophils Absolute: 0 10*3/uL (ref 0.0–0.1)
Basophils Relative: 0 %
Eosinophils Absolute: 0 10*3/uL (ref 0.0–0.5)
Eosinophils Relative: 0 %
HCT: 43 % (ref 36.0–46.0)
Hemoglobin: 14.4 g/dL (ref 12.0–15.0)
Immature Granulocytes: 0 %
Lymphocytes Relative: 7 %
Lymphs Abs: 0.8 10*3/uL (ref 0.7–4.0)
MCH: 30.9 pg (ref 26.0–34.0)
MCHC: 33.5 g/dL (ref 30.0–36.0)
MCV: 92.3 fL (ref 80.0–100.0)
Monocytes Absolute: 0.9 10*3/uL (ref 0.1–1.0)
Monocytes Relative: 8 %
Neutro Abs: 9.5 10*3/uL — ABNORMAL HIGH (ref 1.7–7.7)
Neutrophils Relative %: 85 %
Platelets: 235 10*3/uL (ref 150–400)
RBC: 4.66 MIL/uL (ref 3.87–5.11)
RDW: 12.6 % (ref 11.5–15.5)
WBC: 11.3 10*3/uL — ABNORMAL HIGH (ref 4.0–10.5)
nRBC: 0 % (ref 0.0–0.2)

## 2020-10-12 LAB — COMPREHENSIVE METABOLIC PANEL
ALT: 18 U/L (ref 0–44)
AST: 17 U/L (ref 15–41)
Albumin: 4.4 g/dL (ref 3.5–5.0)
Alkaline Phosphatase: 45 U/L (ref 38–126)
Anion gap: 9 (ref 5–15)
BUN: 29 mg/dL — ABNORMAL HIGH (ref 8–23)
CO2: 32 mmol/L (ref 22–32)
Calcium: 10.5 mg/dL — ABNORMAL HIGH (ref 8.9–10.3)
Chloride: 92 mmol/L — ABNORMAL LOW (ref 98–111)
Creatinine, Ser: 1.21 mg/dL — ABNORMAL HIGH (ref 0.44–1.00)
GFR, Estimated: 45 mL/min — ABNORMAL LOW (ref >60)
Glucose, Bld: 125 mg/dL — ABNORMAL HIGH (ref 70–99)
Potassium: 3.7 mmol/L (ref 3.5–5.1)
Sodium: 133 mmol/L — ABNORMAL LOW (ref 135–145)
Total Bilirubin: 0.6 mg/dL (ref 0.3–1.2)
Total Protein: 6.9 g/dL (ref 6.5–8.1)

## 2020-10-12 LAB — TROPONIN I (HIGH SENSITIVITY)
Troponin I (High Sensitivity): 12 ng/L (ref <18)
Troponin I (High Sensitivity): 13 ng/L (ref <18)

## 2020-10-12 LAB — CBG MONITORING, ED: Glucose-Capillary: 127 mg/dL — ABNORMAL HIGH (ref 70–99)

## 2020-10-12 LAB — LIPASE, BLOOD: Lipase: 56 U/L — ABNORMAL HIGH (ref 11–51)

## 2020-10-12 MED ORDER — IOHEXOL 350 MG/ML SOLN
50.0000 mL | Freq: Once | INTRAVENOUS | Status: AC | PRN
  Administered 2020-10-12: 50 mL via INTRAVENOUS

## 2020-10-12 MED ORDER — SODIUM CHLORIDE 0.9 % IV BOLUS (SEPSIS)
500.0000 mL | Freq: Once | INTRAVENOUS | Status: AC
  Administered 2020-10-12: 500 mL via INTRAVENOUS

## 2020-10-12 MED ORDER — LISINOPRIL 10 MG PO TABS: 40.0000 mg | ORAL_TABLET | Freq: Every day | ORAL | Status: DC

## 2020-10-12 MED ORDER — CHLORTHALIDONE 25 MG PO TABS: 12.5000 mg | ORAL_TABLET | Freq: Every day | ORAL | Status: DC

## 2020-10-12 MED ORDER — QUINAPRIL HCL 10 MG PO TABS: 40.0000 mg | ORAL_TABLET | Freq: Every day | ORAL | Status: DC

## 2020-10-12 MED ORDER — SODIUM CHLORIDE 0.9 % IV SOLN
1000.0000 mL | INTRAVENOUS | Status: DC
  Administered 2020-10-12: 1000 mL via INTRAVENOUS

## 2020-10-12 MED ORDER — ONDANSETRON HCL 4 MG/2ML IJ SOLN
4.0000 mg | Freq: Once | INTRAMUSCULAR | Status: AC
  Administered 2020-10-12: 4 mg via INTRAVENOUS
  Filled 2020-10-12: qty 2

## 2020-10-12 NOTE — ED Notes (Signed)
Patient reports she was diagnosed with Anna Mathews by dermatologist. Patient's PCP reports she was sent here for evaluation here   Patient's WBC count was 14 with PCP and her neutrophil count was elevated and she wanted her evaluated due to progressive sickness, nausea, and increased anorexia

## 2020-10-12 NOTE — ED Notes (Signed)
Patient transported to CT 

## 2020-10-12 NOTE — ED Triage Notes (Signed)
Called by her PCP today due to her abnormal lab results that was drawn on Monday.  Pt also complaint of red, itching rashes that comes and goes for 4 to 6 weeks.  She stated that the rashes is like needle pricks.  PT also lost 7 lbs in 10 days and feeling nauseous.

## 2020-10-12 NOTE — ED Notes (Signed)
RN attempting to call [facility] physicians to determine abnormal lab that patient was sent for.

## 2020-10-12 NOTE — ED Provider Notes (Signed)
[facility] EMERGENCY DEPT Provider Note   CSN: [id number] Arrival date & time: 10/12/20  1213     History Chief Complaint  Patient presents with   Abnormal Labs    Anna Mathews is a 80 y.o. female.  HPI  Patient presents to the ED for evaluation of persistent nausea and abnormal lab tests.  Patient states she initially started off with a rash several weeks ago.  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  Patient has had persistent intermittent intermittent issues with this rash although right now its not active.  Associated with the symptoms she has had issues with nausea weight loss and decreased appetite.  She saw her cardiologist who adjusted her blood pressure medications.  She went to the primary care doctor the other day and had laboratory tests.  Patient states she was called and was instructed to come to the ED for further evaluation as we could do more testing in the emergency room.  Patient was not sent with any specific paperwork.  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   Tear of lateral meniscus of knee 03/20/2017   Tear of medial meniscus of knee 03/20/2017   Arthritis of left knee 02/21/2017   Pain in left knee 02/21/2017   Anticoagulated 07/08/2015   Long term current use of anticoagulant therapy 06/09/2013   HTN (hypertension) 03/31/2013   Unspecified vitamin D deficiency 03/31/2013   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   Osteopenia 03/31/2013   Paroxysmal atrial fibrillation ([place]) 51/02/5850   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    Past Surgical History:  Procedure Laterality Date   BREAST DUCTAL SYSTEM EXCISION Left 06/2002   duct ectasia with fibrosis - atypical lobular hyperplasia with micro calcifications - tamoxifen X 28 months then change to Evista 10/06   BREAST LUMPECTOMY WITH RADIOACTIVE SEED LOCALIZATION Right 01/27/2020   Procedure: RIGHT BREAST LUMPECTOMY WITH RADIOACTIVE SEED LOCALIZATION;  Surgeon: Coralie Keens, MD;  Location: [facility];  Service: General;  Laterality: Right;   BREAST SURGERY Left 4/99   breast biopsy - fibrosis   CARDIOVERSION N/A 07/16/2017   Procedure: CARDIOVERSION;  Surgeon: Jerline Pain, MD;  Location: [facility];  Service: Cardiovascular;  Laterality: N/A;   CARDIOVERSION N/A 10/20/2018   Procedure: CARDIOVERSION;  Surgeon: Buford Dresser, MD;  Location: [facility];  Service: Cardiovascular;  Laterality: N/A;   CATARACT EXTRACTION W/ INTRAOCULAR LENS IMPLANT Right    CESAREAN SECTION     X 2   HYSTEROSCOPY  4/95   w/D&C   TEE WITHOUT CARDIOVERSION N/A 10/20/2018   Procedure: TRANSESOPHAGEAL ECHOCARDIOGRAM (TEE);  Surgeon: Buford Dresser, MD;  Location: [facility] ENDOSCOPY;  Service: Cardiovascular;  Laterality: N/A;     OB History     Gravida  2   Para  2   Term  2   Preterm  0   AB  0   Living  2  SAB  0   IAB  0   Ectopic  0   Multiple  0   Live Births  2           Family History  Problem Relation Age of Onset   Hypertension Mother    Heart disease Father    COPD Father    Diabetes Maternal Grandfather    Diabetes Son    Rheum arthritis Brother    Kidney cancer Brother    Breast cancer Maternal Grandmother    Diabetes Maternal Aunt    Diabetes Maternal Aunt     Social History   Tobacco Use   Smoking status: Former    Packs/day: 0.25    Years: 12.00    Pack years: 3.00     Types: Cigarettes    Start date: 01/16/1957    Quit date: 01/23/1968    Years since quitting: 52.7   Smokeless tobacco: Never  Vaping Use   Vaping Use: Never used  Substance Use Topics   Alcohol use: Yes    Comment: social   Drug use: No    Home Medications Prior to Admission medications   Medication Sig Start Date End Date Taking? Authorizing Provider  aspirin 81 MG chewable tablet Chew 81 mg by mouth daily.   Yes [provider]  Biotin 5000 MCG TABS Take 5,000 mcg by mouth daily.   Yes [provider]  Calcium-Magnesium-Vitamin D (CALCIUM 1200+D3 PO) Take 1 tablet by mouth in the morning and at bedtime.   Yes [provider]  chlorthalidone (HYGROTON) 25 MG tablet Take 0.5 tablets (12.5 mg total) by mouth daily. 10/05/20  Yes Jettie Booze, MD  ELIQUIS 5 MG TABS tablet TAKE 1 TABLET BY MOUTH TWICE A DAY 05/23/20  Yes Camnitz, Ocie Doyne, MD  flecainide (TAMBOCOR) 150 MG tablet Take 1 tablet (150 mg total) by mouth 2 (two) times daily. 09/28/20  Yes Camnitz, Ocie Doyne, MD  Omega-3 Fatty Acids (FISH OIL) 1200 MG CAPS Take 1,200 mg by mouth in the morning and at bedtime.   Yes [provider]  ondansetron (ZOFRAN-ODT) 4 MG disintegrating tablet Take 4 mg by mouth every 8 (eight) hours as needed for nausea or vomiting.   Yes [provider]  predniSONE (DELTASONE) 10 MG tablet Take 10 mg by mouth daily with breakfast.   Yes [provider]  quinapril (ACCUPRIL) 40 MG tablet Take 40 mg by mouth daily.    Yes [provider]  simvastatin (ZOCOR) 20 MG tablet Take 20 mg by mouth every evening.  02/23/13  Yes [provider]  acetaminophen (TYLENOL) 500 MG tablet Take 500-1,000 mg by mouth every 6 (six) hours as needed (pain.).    [provider]  Camphor-Menthol-Methyl Sal (SALONPAS) 3.01-28-08 % PTCH Place 1 patch onto the skin daily as needed (knee/back pain.).    [provider]  Polyethyl  Glycol-Propyl Glycol (LUBRICANT EYE DROPS) 0.4-0.3 % SOLN Place 1 drop into both eyes 3 (three) times daily as needed (dry/irritated eyes).    [provider]  PROLIA 60 MG/ML SOSY injection INJECT 60MG  SUBCUTANEOUSLY  INTO UPPER ARM, THIGH, OR  ABDOMEN EVERY 6 MONTHS  (GIVEN AT PRESCRIBERS  OFFICE) Patient taking differently: No sig reported 11/18/17   Salvadore Dom, MD    Allergies    Penicillins, Atorvastatin, Crab [shellfish allergy], and Sulfa antibiotics  Review of Systems   Review of Systems  Constitutional:  Positive for unexpected weight change. Negative for fever.  HENT:  Negative for trouble swallowing.   Gastrointestinal:  Positive for abdominal pain.       Patient has had discomfort in the upper abdomen going into her chest  Genitourinary:  Negative for dysuria.  Neurological:  Positive for light-headedness. Negative for speech difficulty, numbness and headaches.  All other systems reviewed and are negative.  Physical Exam Updated Vital Signs BP (!) 179/69 (BP Location: Right Arm)   Pulse 60   Temp 98 F (36.7 C)   Resp 18   Ht 1.6 m (5\' 3" )   Wt 63.5 kg   LMP 01/23/1988 (Approximate)   SpO2 96%   BMI 24.80 kg/m   Physical Exam Vitals and nursing note reviewed.  Constitutional:      Appearance: She is well-developed. She is not diaphoretic.  HENT:     Head: Normocephalic and atraumatic.     Right Ear: External ear normal.     Left Ear: External ear normal.     Mouth/Throat:     Comments: No oropharyngeal lesions noted Eyes:     General: No scleral icterus.       Right eye: No discharge.        Left eye: No discharge.     Conjunctiva/sclera: Conjunctivae normal.  Neck:     Trachea: No tracheal deviation.  Cardiovascular:     Rate and Rhythm: Normal rate and regular rhythm.  Pulmonary:     Effort: Pulmonary effort is normal. No respiratory distress.     Breath sounds: Normal breath sounds. No stridor. No wheezing or rales.  Abdominal:      General: Bowel sounds are normal. There is no distension.     Palpations: Abdomen is soft.     Tenderness: There is abdominal tenderness. There is no guarding or rebound.     Comments: mild tenderness epigastric region  Musculoskeletal:        General: No tenderness or deformity.     Cervical back: Neck supple.  Skin:    General: Skin is warm and dry.     Findings: No rash.     Comments: No petechiae purpura noted, no urticaria  Neurological:     General: No focal deficit present.     Mental Status: She is alert.     Cranial Nerves: No cranial nerve deficit (no facial droop, extraocular movements intact, no slurred speech).     Sensory: No sensory deficit.     Motor: No abnormal muscle tone or seizure activity.     Coordination: Coordination normal.  Psychiatric:        Mood and Affect: Mood normal.    ED Results / Procedures / Treatments   Labs (all labs ordered are listed, but only abnormal results are displayed) Labs Reviewed  COMPREHENSIVE METABOLIC PANEL - Abnormal; Notable for the following components:      Result Value   Sodium 133 (*)    Chloride 92 (*)    Glucose, Bld 125 (*)    BUN 29 (*)    Creatinine, Ser 1.21 (*)    Calcium 10.5 (*)    GFR, Estimated 45 (*)    All other components within normal limits  CBC WITH DIFFERENTIAL/PLATELET - Abnormal; Notable for the following components:   WBC 11.3 (*)    Neutro Abs 9.5 (*)    All other components within normal limits  LIPASE, BLOOD - Abnormal; Notable for the following components:   Lipase 56 (*)    All other components within normal limits  CBG MONITORING, ED - Abnormal; Notable for the following components:   Glucose-Capillary 127 (*)    All other components within normal limits  TROPONIN I (HIGH SENSITIVITY)  TROPONIN I (HIGH SENSITIVITY)    EKG None  Radiology CT ABDOMEN PELVIS W CONTRAST  Result Date: 10/12/2020 CLINICAL DATA:  Abdominal pain. Skin rashes coming and going for 4-6 weeks. EXAM:  CT ABDOMEN AND PELVIS WITH CONTRAST TECHNIQUE: Multidetector CT imaging of the abdomen and pelvis was performed using the standard protocol following bolus administration of intravenous contrast. CONTRAST:  76mL OMNIPAQUE IOHEXOL 350 MG/ML SOLN COMPARISON:  CT scan 05/28/2017 FINDINGS: Lower chest: The lung bases are clear of acute process. No pleural effusion or pulmonary lesions. The heart is normal in size. No pericardial effusion. The distal esophagus and aorta are unremarkable. Stable aortic and coronary artery calcifications. Hepatobiliary: No hepatic lesions or intrahepatic biliary dilatation. Gallbladder is unremarkable. No common bile duct dilatation. Pancreas: No mass, inflammation or ductal dilatation. Spleen: Normal size.  No focal lesions. Adrenals/Urinary Tract: Adrenal glands and kidneys are unremarkable. Stable small bilateral renal calculi but no obstructing ureteral calculi or bladder calculi. No renal lesions. No significant collecting system abnormalities on the delayed images. Stomach/Bowel: The stomach, duodenum, small bowel and colon are unremarkable. No acute inflammatory changes, mass lesions or obstructive findings. There is a moderate amount of stool throughout the colon which may suggest constipation. Colonic diverticulosis without findings for acute diverticulitis. Vascular/Lymphatic: Moderate atherosclerotic calcifications involving the aorta and iliac arteries. The branch vessels are patent. The major venous structures are patent. No mesenteric or retroperitoneal mass or adenopathy. Reproductive: The uterus and ovaries are unremarkable. Uterus is retroverted. Other: No pelvic mass or adenopathy. No free pelvic fluid collections. No inguinal mass or adenopathy. No abdominal wall hernia or subcutaneous lesions. Musculoskeletal: No significant bony findings. Bilateral hip joint degenerative changes are noted. IMPRESSION: 1. No acute abdominal/pelvic findings, mass lesions or adenopathy.  2. Stable small bilateral renal calculi but no obstructing ureteral calculi or bladder calculi. 3. Moderate amount of stool throughout the colon may suggest constipation. 4. Aortic atherosclerosis. Aortic Atherosclerosis (ICD10-I70.0). Electronically Signed   By: Marijo Sanes M.D.   On: 10/12/2020 15:38    Procedures Procedures   Medications Ordered in ED Medications  sodium chloride 0.9 % bolus 500 mL (0 mLs Intravenous Stopped 10/12/20 1510)    Followed by  0.9 %  sodium chloride infusion (0 mLs Intravenous Stopped 10/12/20 1559)  chlorthalidone (HYGROTON) tablet 12.5 mg (has no administration in time range)  lisinopril (ZESTRIL) tablet 40 mg (has no administration in time range)  ondansetron (ZOFRAN) injection 4 mg (4 mg Intravenous Given 10/12/20 1410)  iohexol (OMNIPAQUE) 350 MG/ML injection 50 mL (50 mLs Intravenous Contrast Given 10/12/20 1458)    ED Course  I have reviewed the triage vital signs and the nursing notes.  Pertinent labs & imaging results that were available during my care of the patient were reviewed by me and considered in my medical decision making (see chart for details).    MDM Rules/Calculators/A&P                           Pt with recent rash, now resolved, malaise, nausea, weight loss.  Labs with several mild electrolyte abnormalities, hypercalcemia.  No severe dehydration.  Pt treated with IV fluids, antiemetics.  Sx improving.  With her weight loss, elevated lipase, CT scan ordered to evaluate for mass, pancreatitis.  CT reassuring.  No mass, no pancreatic lesion.  No acute infection.  Recc outpt follow up with GI.  Consider further workup hypercalcemia, PTH level. Final Clinical Impression(s) / ED Diagnoses Final diagnoses:  Hypertension, unspecified type  Nausea  Hypercalcemia    Rx / DC Orders ED Discharge Orders     None        Dorie Rank, MD 10/12/20 [phone]

## 2020-10-12 NOTE — Discharge Instructions (Addendum)
Your cardiac work-up today was negative with negative cardiac enzymes, and your CT scan showed a moderate stool burden. Continue fluid intake for your constipation.  Can try over-the-counter stool softeners in addition to over-the-counter laxative such as Dulcolax or MiraLAX.

## 2020-10-12 NOTE — ED Notes (Signed)
V.o. received to stop IV fluids. Pt saline locked.

## 2020-10-12 NOTE — ED Notes (Signed)
ED Provider at bedside. Lawsing MD.

## 2020-10-13 NOTE — Telephone Encounter (Signed)
Patient was seen in ED on 9/21

## 2020-10-14 DIAGNOSIS — L298 Other pruritus: Secondary | ICD-10-CM | POA: Diagnosis not present

## 2020-10-17 DIAGNOSIS — R112 Nausea with vomiting, unspecified: Secondary | ICD-10-CM | POA: Diagnosis not present

## 2020-10-17 DIAGNOSIS — D692 Other nonthrombocytopenic purpura: Secondary | ICD-10-CM | POA: Diagnosis not present

## 2020-10-17 DIAGNOSIS — K5900 Constipation, unspecified: Secondary | ICD-10-CM | POA: Diagnosis not present

## 2020-10-19 ENCOUNTER — Telehealth: Payer: Self-pay | Admitting: Interventional Cardiology

## 2020-10-19 NOTE — Telephone Encounter (Signed)
I spoke with patient. She reports she feels nauseated and has decreased energy until around 2 PM daily. Only new medication is chlorthalidone.  She is asking if OK to take this in the evening.  I told her this would be OK but due to diuretic effect it may keep her up during the night.  She may try and take in the afternoon.  She will let us know if symptoms continue after changing timing of medication. Patient reports itching/burning is getting better.

## 2020-10-19 NOTE — Telephone Encounter (Signed)
Pt would like to know if she can start taking the medication at night time rather than in the morning.. please advise

## 2020-10-19 NOTE — Telephone Encounter (Signed)
Pt c/o medication issue:  1. Name of Medication: chlorthalidone (HYGROTON) 25 MG tablet   2. How are you currently taking this medication (dosage and times per day)? Take 0.5 tablets (12.5 mg total) by mouth daily.  3. Are you having a reaction (difficulty breathing--STAT)? Nausea and fatigue   4. What is your medication issue? Pt states that this medication is making her nauseous and extremely fatigued, not able to do anything until hours later after taking it.. sleeps fine throughout the night.

## 2020-10-21 ENCOUNTER — Ambulatory Visit: Payer: Medicare PPO | Admitting: Internal Medicine

## 2020-10-24 ENCOUNTER — Ambulatory Visit: Payer: Medicare PPO | Admitting: Internal Medicine

## 2020-10-24 NOTE — Progress Notes (Deleted)
Name: Anna Mathews  MRN/ DOB: [id number], [birth year]    Age/ Sex: 80 y.o., female     PCP: Vernie Shanks, MD   Reason for Endocrinology Evaluation: Osteoporosis     Initial Endocrinology Clinic Visit: 08/25/2018    PATIENT IDENTIFIER: Anna Mathews is a 80 y.o., female with a past medical history of Breast Ca ( S/P lumpectomy 01/27/2020) ,A.Fib, HTN , dyslipidemia ,and GERD . She has followed with [facility] since 8/3/2020for consultative assistance with management of her Osteoporosis.   HISTORICAL SUMMARY:   Pt was diagnosed with osteopenia:2013   Menarche at age : 89 Menopausal at age : 01/23/1988 Fracture Hx:  Finger fracture from a fall Hx of HRT: no FH of osteoporosis or hip fracture: no Prior Hx of anti-estrogenic therapy :Evista in 2016 Prior Hx of anti-resorptive therapy : Fosmax -worse GERD,Prolia started 01/2017, the second dose was 08/06/2018  In our office she received  Prolia on 03/24/2019,  10/01/2019  Her insurance stopped covering Prolia in 2022 and we opted to give her one dose of Reclast 4/20/222   Pt has been on anti-resorptive therapy due to a 10-yr FRAX of > 3.0 at the hip    Reclast infusion 05/11/2020   SUBJECTIVE:    Today (10/24/2020):  Ms. Servello is here for a follow up on Osteoporosis  Prolia  Started in 2019 , in our office she received  Prolia on 03/24/2019,  10/01/2019 Insurance plan did not cover Prolia anymore   Calcium - vitamin D 600 mg BID    HISTORY:  Past Medical History:  Past Medical History:  Diagnosis Date   Atrial fibrillation ([place]) 09/2009   Breast cancer ([place])    Diverticulosis 09/2006   GERD (gastroesophageal reflux disease)    Hiatal hernia    Hyperlipidemia    Hypertension    Osteopenia    Postmenopausal hormone replacement therapy 1989 - 07/2000   Past Surgical History:  Past Surgical History:  Procedure Laterality Date   BREAST DUCTAL SYSTEM EXCISION Left 06/2002   duct ectasia with fibrosis -  atypical lobular hyperplasia with micro calcifications - tamoxifen X 28 months then change to Evista 10/06   BREAST LUMPECTOMY WITH RADIOACTIVE SEED LOCALIZATION Right 01/27/2020   Procedure: RIGHT BREAST LUMPECTOMY WITH RADIOACTIVE SEED LOCALIZATION;  Surgeon: Coralie Keens, MD;  Location: [facility];  Service: General;  Laterality: Right;   BREAST SURGERY Left 4/99   breast biopsy - fibrosis   CARDIOVERSION N/A 07/16/2017   Procedure: CARDIOVERSION;  Surgeon: Jerline Pain, MD;  Location: [facility];  Service: Cardiovascular;  Laterality: N/A;   CARDIOVERSION N/A 10/20/2018   Procedure: CARDIOVERSION;  Surgeon: Buford Dresser, MD;  Location: [facility];  Service: Cardiovascular;  Laterality: N/A;   CATARACT EXTRACTION W/ INTRAOCULAR LENS IMPLANT Right    CESAREAN SECTION     X 2   HYSTEROSCOPY  4/95   w/D&C   TEE WITHOUT CARDIOVERSION N/A 10/20/2018   Procedure: TRANSESOPHAGEAL ECHOCARDIOGRAM (TEE);  Surgeon: Buford Dresser, MD;  Location: [facility] ENDOSCOPY;  Service: Cardiovascular;  Laterality: N/A;   Social History:  reports that she quit smoking about 52 years ago. Her smoking use included cigarettes. She started smoking about 63 years ago. She has a 3.00 pack-year smoking history. She has never used smokeless tobacco. She reports current alcohol use. She reports that she does not use drugs. Family History:  Family History  Problem Relation Age of Onset   Hypertension Mother    Heart disease  Father    COPD Father    Diabetes Maternal Grandfather    Diabetes Son    Rheum arthritis Brother    Kidney cancer Brother    Breast cancer Maternal Grandmother    Diabetes Maternal Aunt    Diabetes Maternal Aunt      HOME MEDICATIONS: Allergies as of 10/24/2020       Reactions   Penicillins Other (See Comments)   REACTION: "ITCHING IN EYES" Has patient had a PCN reaction causing immediate rash, facial/tongue/throat swelling, SOB or lightheadedness with hypotension:  Unknown Has patient had a PCN reaction causing severe rash involving mucus membranes or skin necrosis: Unknown Has patient had a PCN reaction that required hospitalization: No Has patient had a PCN reaction occurring within the last 10 years: No If all of the above answers are "NO", then may proceed with Cephalosporin use.   Atorvastatin Other (See Comments)   Crab [shellfish Allergy] Itching   Full body itching   Sulfa Antibiotics Other (See Comments)   REACTION: " NOT SURE"        Medication List        Accurate as of October 24, 2020  7:29 AM. If you have any questions, ask your nurse or doctor.          acetaminophen 500 MG tablet Commonly known as: TYLENOL Take 500-1,000 mg by mouth every 6 (six) hours as needed (pain.).   aspirin 81 MG chewable tablet Chew 81 mg by mouth daily.   Biotin 5000 MCG Tabs Take 5,000 mcg by mouth daily.   CALCIUM 1200+D3 PO Take 1 tablet by mouth in the morning and at bedtime.   chlorthalidone 25 MG tablet Commonly known as: HYGROTON Take 0.5 tablets (12.5 mg total) by mouth daily.   Eliquis 5 MG Tabs tablet Generic drug: apixaban TAKE 1 TABLET BY MOUTH TWICE A DAY   Fish Oil 1200 MG Caps Take 1,200 mg by mouth in the morning and at bedtime.   flecainide 150 MG tablet Commonly known as: TAMBOCOR Take 1 tablet (150 mg total) by mouth 2 (two) times daily.   Lubricant Eye Drops 0.4-0.3 % Soln Generic drug: Polyethyl Glycol-Propyl Glycol Place 1 drop into both eyes 3 (three) times daily as needed (dry/irritated eyes).   ondansetron 4 MG disintegrating tablet Commonly known as: ZOFRAN-ODT Take 4 mg by mouth every 8 (eight) hours as needed for nausea or vomiting.   predniSONE 10 MG tablet Commonly known as: DELTASONE Take 10 mg by mouth daily with breakfast.   Prolia 60 MG/ML Sosy injection Generic drug: denosumab INJECT 60MG SUBCUTANEOUSLY  INTO UPPER ARM, THIGH, OR  ABDOMEN EVERY 6 MONTHS  (GIVEN AT PRESCRIBERS   OFFICE) What changed: See the new instructions.   quinapril 40 MG tablet Commonly known as: ACCUPRIL Take 40 mg by mouth daily.   Salonpas 3.01-28-08 % Ptch Generic drug: Camphor-Menthol-Methyl Sal Place 1 patch onto the skin daily as needed (knee/back pain.).   simvastatin 20 MG tablet Commonly known as: ZOCOR Take 20 mg by mouth every evening.          OBJECTIVE:   PHYSICAL EXAM: VS: LMP 01/23/1988 (Approximate)   EXAM: General: Pt appears well and is in NAD  Neck: General: Supple without adenopathy. Thyroid: Thyroid size normal.  No goiter or nodules appreciated. No thyroid bruit.  Lungs: Clear with good BS bilat with no rales, rhonchi, or wheezes  Heart: Auscultation: RRR.  Abdomen: Normoactive bowel sounds, soft, nontender, without masses or organomegaly palpable  Extremities:  BL LE: No pretibial edema normal ROM and strength.  Skin: Hair: Texture and amount normal with gender appropriate distribution Skin Inspection: No rashes Skin Palpation: Skin temperature, texture, and thickness normal to palpation  Mental Status: Judgment, insight: Intact Orientation: Oriented to time, place, and person Mood and affect: No depression, anxiety, or agitation     DATA REVIEWED:  Results for AMARI, BURNSWORTH (MRN [id number]) as of 04/28/2020 08:16  Ref. Range 04/27/2020 11:57  Sodium Latest Ref Range: 135 - 145 mEq/L 142  Potassium Latest Ref Range: 3.5 - 5.1 mEq/L 4.8  Chloride Latest Ref Range: 96 - 112 mEq/L 104  CO2 Latest Ref Range: 19 - 32 mEq/L 33 (H)  Glucose Latest Ref Range: 70 - 99 mg/dL 96  BUN Latest Ref Range: 6 - 23 mg/dL 27 (H)  Creatinine Latest Ref Range: 0.40 - 1.20 mg/dL 1.00  Calcium Latest Ref Range: 8.4 - 10.5 mg/dL 9.7  Alkaline Phosphatase Latest Ref Range: 39 - 117 U/L 53  Albumin Latest Ref Range: 3.5 - 5.2 g/dL 4.6  AST Latest Ref Range: 0 - 37 U/L 18  ALT Latest Ref Range: 0 - 35 U/L 16  Total Protein Latest Ref Range: 6.0 - 8.3 g/dL 7.2  Total  Bilirubin Latest Ref Range: 0.2 - 1.2 mg/dL 0.5  GFR Latest Ref Range: >60.00 mL/min 52.92 (L)  VITD Latest Ref Range: 30.00 - 100.00 ng/mL 30.10  TSH Latest Ref Range: 0.35 - 4.50 uIU/mL 3.32  T4,Free(Direct) Latest Ref Range: 0.60 - 1.60 ng/dL 0.95   DXA 05/03/2020     T - score  Change from 2018  AP - 0.20 Up 6%   RFN -0.7    Right total hip  -0.8 Up 5%   LFN 1.6    Left total hip -0.4 Up 12%      ASSESSMENT / PLAN / RECOMMENDATIONS:   Osteopenia:   -The patient has completed a total of 3 years on Prolia, the reason for treatment is because she met criteria for antiresorptive therapy due to at 10-year FRAX of > 3% at the hips. -She is intolerant to oral bisphosphonates , has been on Evista in 2016. Started prolia in 01/2017, with the last dose 09/2019 -We discussed that we are at the point to consider a drug holiday. -Unfortunately she did not have her DXA scan in June 2021 as initially planned, we will proceed with this [facility] at [facility] mammography. -BMP today is stable -We will proceed with Reclast infusion x1 dose, as her insurance has declined Prolia coverage for the year of 2022  Medications  Continue calcium/vitamin D 600 mg twice daily  Follow-up in 1 year  Signed electronically by: Mack Guise, MD  [facility]  [address] [place], [place] [address] Phone: [phone] FAX: [phone]      CC: Vernie Shanks, [place] [address] Phone: [phone]  Fax: [phone]   Return to Endocrinology clinic as below: Future Appointments  Date Time Provider [place]  10/24/2020 11:30 AM Christoher Drudge, Melanie Crazier, MD [facility] None  02/07/2021  2:15 PM Constance Haw, MD [facility]  04/27/2021 10:10 AM Turkessa Ostrom, Melanie Crazier, MD [facility] None

## 2020-11-03 ENCOUNTER — Other Ambulatory Visit: Payer: Self-pay

## 2020-11-03 ENCOUNTER — Encounter: Payer: Self-pay | Admitting: Internal Medicine

## 2020-11-03 ENCOUNTER — Ambulatory Visit (INDEPENDENT_AMBULATORY_CARE_PROVIDER_SITE_OTHER): Payer: Medicare PPO | Admitting: Internal Medicine

## 2020-11-03 VITALS — BP 132/80 | HR 68 | Ht 63.0 in | Wt 130.6 lb

## 2020-11-03 DIAGNOSIS — R634 Abnormal weight loss: Secondary | ICD-10-CM | POA: Diagnosis not present

## 2020-11-03 DIAGNOSIS — M8580 Other specified disorders of bone density and structure, unspecified site: Secondary | ICD-10-CM | POA: Diagnosis not present

## 2020-11-03 LAB — COMPREHENSIVE METABOLIC PANEL
ALT: 14 U/L (ref 0–35)
AST: 18 U/L (ref 0–37)
Albumin: 3.9 g/dL (ref 3.5–5.2)
Alkaline Phosphatase: 61 U/L (ref 39–117)
BUN: 31 mg/dL — ABNORMAL HIGH (ref 6–23)
CO2: 35 mEq/L — ABNORMAL HIGH (ref 19–32)
Calcium: 9.5 mg/dL (ref 8.4–10.5)
Chloride: 98 mEq/L (ref 96–112)
Creatinine, Ser: 1.27 mg/dL — ABNORMAL HIGH (ref 0.40–1.20)
GFR: 39.58 mL/min — ABNORMAL LOW (ref >60.00)
Glucose, Bld: 148 mg/dL — ABNORMAL HIGH (ref 70–99)
Potassium: 4.4 mEq/L (ref 3.5–5.1)
Sodium: 139 mEq/L (ref 135–145)
Total Bilirubin: 0.4 mg/dL (ref 0.2–1.2)
Total Protein: 6.8 g/dL (ref 6.0–8.3)

## 2020-11-03 LAB — VITAMIN D 25 HYDROXY (VIT D DEFICIENCY, FRACTURES): VITD: 28.93 ng/mL — ABNORMAL LOW (ref 30.00–100.00)

## 2020-11-03 LAB — T4, FREE: Free T4: 0.96 ng/dL (ref 0.60–1.60)

## 2020-11-03 LAB — TSH: TSH: 3.71 u[IU]/mL (ref 0.35–5.50)

## 2020-11-03 NOTE — Progress Notes (Signed)
Name: ALAJAH WITMAN  MRN/ DOB: [id number], [birth year]    Age/ Sex: 80 y.o., female     PCP: Vernie Shanks, MD   Reason for Endocrinology Evaluation: Osteoporosis     Initial Endocrinology Clinic Visit: 08/25/2018    PATIENT IDENTIFIER: Ms. CHERELLE MIDKIFF is a 80 y.o., female with a past medical history of Breast Ca ( S/P lumpectomy 01/27/2020) ,A.Fib, HTN , dyslipidemia ,and GERD . She has followed with [facility] since 8/3/2020for consultative assistance with management of her Osteoporosis.   HISTORICAL SUMMARY:   Pt was diagnosed with osteopenia:2013   Menarche at age : 80 Menopausal at age : 01/23/1988 Fracture Hx:  Finger fracture from a fall Hx of HRT: no FH of osteoporosis or hip fracture: no Prior Hx of anti-estrogenic therapy :Evista in 2016 Prior Hx of anti-resorptive therapy : Fosmax -worse GERD,Prolia started 01/2017, the second dose was 08/06/2018, 03/24/2019,10/01/2019  In our office she received  Prolia on 03/24/2019,  10/01/2019  Her insurance stopped covering [place] in 2022 and we opted to give her one dose of Reclast 05/11/2020  Reclast infusion 05/11/2020  Pt has been on anti-resorptive therapy due to a 10-yr FRAX of > 3.0 at the hip       SUBJECTIVE:    Today (11/03/2020):  Ms. Hanback is here for a follow up on Osteopenia.  She is accompanied by her daughter, they are concerned about nausea, weight loss, and decreased appetite over the past month.  She has been evaluated by her PCP as well as emergency room and GI During her evaluation with GI she was having constipation and most of the conversation was around that, constipation has resolved since then she has an upcoming appointment with GI  While visiting the ED one of the nurses mentioned hypercalcemia and evaluating this by endocrinology  As far as her osteoporosis goes, She was started on Prolia  in 2019 , and took last Prolia injection 09/2019, we had to switch to Reclast as the patient  insurance did not cover Prolia anymore She received Reclast injection in early 2022 without any side effects  Has purpura and tingling over her arms  She sees oncology for history of breast cancer   Calcium - vitamin D 600 mg BID    HISTORY:  Past Medical History:  Past Medical History:  Diagnosis Date   Atrial fibrillation ([place]) 09/2009   Breast cancer ([place])    Diverticulosis 09/2006   GERD (gastroesophageal reflux disease)    Hiatal hernia    Hyperlipidemia    Hypertension    Osteopenia    Postmenopausal hormone replacement therapy 1989 - 07/2000   Past Surgical History:  Past Surgical History:  Procedure Laterality Date   BREAST DUCTAL SYSTEM EXCISION Left 06/2002   duct ectasia with fibrosis - atypical lobular hyperplasia with micro calcifications - tamoxifen X 28 months then change to Evista 10/06   BREAST LUMPECTOMY WITH RADIOACTIVE SEED LOCALIZATION Right 01/27/2020   Procedure: RIGHT BREAST LUMPECTOMY WITH RADIOACTIVE SEED LOCALIZATION;  Surgeon: Coralie Keens, MD;  Location: [facility];  Service: General;  Laterality: Right;   BREAST SURGERY Left 4/99   breast biopsy - fibrosis   CARDIOVERSION N/A 07/16/2017   Procedure: CARDIOVERSION;  Surgeon: Jerline Pain, MD;  Location: [facility];  Service: Cardiovascular;  Laterality: N/A;   CARDIOVERSION N/A 10/20/2018   Procedure: CARDIOVERSION;  Surgeon: Buford Dresser, MD;  Location: [place];  Service: Cardiovascular;  Laterality: N/A;   CATARACT EXTRACTION W/  INTRAOCULAR LENS IMPLANT Right    CESAREAN SECTION     X 2   HYSTEROSCOPY  4/95   w/D&C   TEE WITHOUT CARDIOVERSION N/A 10/20/2018   Procedure: TRANSESOPHAGEAL ECHOCARDIOGRAM (TEE);  Surgeon: Buford Dresser, MD;  [facility] ENDOSCOPY;  Service: Cardiovascular;  Laterality: N/A;   Social History:  reports that she quit smoking about 52 years ago. Her smoking use included cigarettes. She started smoking about 63 years ago. She has a 3.00 pack-year  smoking history. She has never used smokeless tobacco. She reports current alcohol use. She reports that she does not use drugs. Family History:  Family History  Problem Relation Age of Onset   Hypertension Mother    Heart disease Father    COPD Father    Diabetes Maternal Grandfather    Diabetes Son    Rheum arthritis Brother    Kidney cancer Brother    Breast cancer Maternal Grandmother    Diabetes Maternal Aunt    Diabetes Maternal Aunt      HOME MEDICATIONS: Allergies as of 11/03/2020       Reactions   Penicillins Other (See Comments)   REACTION: "ITCHING IN EYES" Has patient had a PCN reaction causing immediate rash, facial/tongue/throat swelling, SOB or lightheadedness with hypotension: Unknown Has patient had a PCN reaction causing severe rash involving mucus membranes or skin necrosis: Unknown Has patient had a PCN reaction that required hospitalization: No Has patient had a PCN reaction occurring within the last 10 years: No If all of the above answers are "NO", then may proceed with Cephalosporin use.   Atorvastatin Other (See Comments)   Crab [shellfish Allergy] Itching   Full body itching   Sulfa Antibiotics Other (See Comments)   REACTION: " NOT SURE"        Medication List        Accurate as of November 03, 2020 10:40 AM. If you have any questions, ask your nurse or doctor.          STOP taking these medications    predniSONE 10 MG tablet Commonly known as: DELTASONE Stopped by: Dorita Sciara, MD       TAKE these medications    acetaminophen 500 MG tablet Commonly known as: TYLENOL Take 500-1,000 mg by mouth every 6 (six) hours as needed (pain.).   aspirin 81 MG chewable tablet Chew 81 mg by mouth daily.   Biotin 5000 MCG Tabs Take 5,000 mcg by mouth daily.   CALCIUM 1200+D3 PO Take 1 tablet by mouth in the morning and at bedtime.   chlorthalidone 25 MG tablet Commonly known as: HYGROTON Take 0.5 tablets (12.5 mg total) by  mouth daily.   Eliquis 5 MG Tabs tablet Generic drug: apixaban TAKE 1 TABLET BY MOUTH TWICE A DAY   Fish Oil 1200 MG Caps Take 1,200 mg by mouth in the morning and at bedtime.   flecainide 150 MG tablet Commonly known as: TAMBOCOR Take 1 tablet (150 mg total) by mouth 2 (two) times daily.   Lubricant Eye Drops 0.4-0.3 % Soln Generic drug: Polyethyl Glycol-Propyl Glycol Place 1 drop into both eyes 3 (three) times daily as needed (dry/irritated eyes).   ondansetron 4 MG disintegrating tablet Commonly known as: ZOFRAN-ODT Take 4 mg by mouth every 8 (eight) hours as needed for nausea or vomiting.   Prolia 60 MG/ML Sosy injection Generic drug: denosumab INJECT 60MG SUBCUTANEOUSLY  INTO UPPER ARM, THIGH, OR  ABDOMEN EVERY 6 MONTHS  (GIVEN AT PRESCRIBERS  OFFICE)  What changed: See the new instructions.   quinapril 40 MG tablet Commonly known as: ACCUPRIL Take 40 mg by mouth daily.   Salonpas 3.01-28-08 % Ptch Generic drug: Camphor-Menthol-Methyl Sal Place 1 patch onto the skin daily as needed (knee/back pain.).   simvastatin 20 MG tablet Commonly known as: ZOCOR Take 20 mg by mouth every evening.          OBJECTIVE:   PHYSICAL EXAM: VS: BP 132/80 (BP Location: Left Arm, Patient Position: Sitting, Cuff Size: Small)   Pulse 68   Ht 5' 3"  (1.6 m)   Wt 130 lb 9.6 oz (59.2 kg)   LMP 01/23/1988 (Approximate)   SpO2 99%   BMI 23.13 kg/m   EXAM: General: Pt appears well and is in NAD  Neck: General: Supple without adenopathy. Thyroid: Thyroid size normal.  No goiter or nodules appreciated.  Lungs: Clear with good BS bilat with no rales, rhonchi, or wheezes  Heart: Auscultation: RRR.  Abdomen: Normoactive bowel sounds, soft, nontender, without masses or organomegaly palpable  Extremities:  BL LE: No pretibial edema normal ROM and strength.  Skin: Hair: Texture and amount normal with gender appropriate distribution Skin Inspection: No rashes Skin Palpation: Skin  temperature, texture, and thickness normal to palpation  Mental Status: Judgment, insight: Intact Orientation: Oriented to time, place, and person Mood and affect: No depression, anxiety, or agitation     DATA REVIEWED:  Results for NICHELE, SLAWSON (MRN [id number]) as of 11/03/2020 12:57  Ref. Range 10/12/2020 12:43  Sodium Latest Ref Range: 135 - 145 mmol/L 133 (L)  Potassium Latest Ref Range: 3.5 - 5.1 mmol/L 3.7  Chloride Latest Ref Range: 98 - 111 mmol/L 92 (L)  CO2 Latest Ref Range: 22 - 32 mmol/L 32  Glucose Latest Ref Range: 70 - 99 mg/dL 125 (H)  BUN Latest Ref Range: 8 - 23 mg/dL 29 (H)  Creatinine Latest Ref Range: 0.44 - 1.00 mg/dL 1.21 (H)  Calcium Latest Ref Range: 8.9 - 10.3 mg/dL 10.5 (H)  Anion gap Latest Ref Range: 5 - 15  9  Alkaline Phosphatase Latest Ref Range: 38 - 126 U/L 45  Albumin Latest Ref Range: 3.5 - 5.0 g/dL 4.4  Lipase Latest Ref Range: 11 - 51 U/L 56 (H)  AST Latest Ref Range: 15 - 41 U/L 17  ALT Latest Ref Range: 0 - 44 U/L 18  Total Protein Latest Ref Range: 6.5 - 8.1 g/dL 6.9  Total Bilirubin Latest Ref Range: 0.3 - 1.2 mg/dL 0.6  GFR, Estimated Latest Ref Range: >60 mL/min 45 (L)   DXA 05/03/2020     T - score  Change from 2018  AP - 0.20 Up 6% *  RFN -0.7    Right total hip  -0.8 Up 4.6% *  LFN 1.6    Left total hip -0.4 Up 11.8% *       ASSESSMENT / PLAN / RECOMMENDATIONS:   Osteopenia:   -The patient has completed a total of 3 years on Prolia(Started prolia in 01/2017, with the last dose 09/2019) , the reason for treatment is because she met criteria for antiresorptive therapy due to at 10-year FRAX of > 3% at the hips. -She is intolerant to oral bisphosphonates , has been on Evista in 2016. - Received reclast 05/11/2020 -Her bone density in 04/2020 showed significant improvement in her BMD -We will repeat DXA in 2024    Medications  Continue calcium/vitamin D 600 mg twice daily     2.  Nausea/weight loss:  -I explained to  the patient and her daughter that hyperparathyroidism does not cause nausea and unexplained weight loss -I have encouraged him to keep follow-up appointment with GI -We will check TFTs  3.  Hypercalcemia  -She had a serum calcium of 10.5 mg/DL(none corrected) during labs in the ED, her lipase was slightly elevated at 56 and GFR lower than baseline at 45. -We will repeat labs today    Follow-up in 1 year or sooner pending lab results  Signed electronically by: Mack Guise, MD  [facility]  [address] [place], [place] [address] Phone: [phone] FAX: [phone]      CC: Vernie Shanks, [place] [address] Phone: [phone]  Fax: [phone]   Return to Endocrinology clinic as below: Future Appointments  Date Time Provider [place]  02/07/2021  2:15 PM Constance Haw, MD [facility]  04/27/2021 10:10 AM Landyn Lorincz, Melanie Crazier, MD [facility] None

## 2020-11-06 ENCOUNTER — Other Ambulatory Visit: Payer: Self-pay | Admitting: Cardiology

## 2020-11-07 ENCOUNTER — Telehealth: Payer: Self-pay | Admitting: Internal Medicine

## 2020-11-07 LAB — VITAMIN D 1,25 DIHYDROXY
Vitamin D 1, 25 (OH)2 Total: 42 pg/mL (ref 18–72)
Vitamin D2 1, 25 (OH)2: <8 pg/mL
Vitamin D3 1, 25 (OH)2: 42 pg/mL

## 2020-11-07 LAB — PARATHYROID HORMONE, INTACT (NO CA): PTH: 48 pg/mL (ref 16–77)

## 2020-11-07 LAB — CALCIUM, IONIZED: Calcium, Ion: 5.1 mg/dL (ref 4.8–5.6)

## 2020-11-07 NOTE — Telephone Encounter (Signed)
Pt would like results sent to Dr Jacelyn Grip. [facility]) Please let pt know if they can be faxed/sent to Dr  or if she has to pick them up. Pt contact [phone]

## 2020-11-07 NOTE — Telephone Encounter (Signed)
Patient notified and verbalized understanding. 

## 2020-11-07 NOTE — Telephone Encounter (Signed)
Eliquis 5 mg refill request received. Patient is 80years old, weight- 59.2 kg, Crea-1.27 on 11/04/19, Diagnosis-PAF, and last seen by Dr. Curt Bears on  08/11/20. Dose is appropriate based on dosing criteria. Will send in refill to requested pharmacy.

## 2020-11-07 NOTE — Telephone Encounter (Signed)
lab

## 2020-11-07 NOTE — Telephone Encounter (Signed)
Please let the pt know that her calcium, parathyroid and thyroid hormone levels have all come back normal.   At this time there's NO endocrine reason to explain her weight loss and nausea, I would strongly ask her to keep her appointment with GI   Let her know, that her kidneys remain dehydrated, and she needs to follow up with PCP about her kidney function    Thanks

## 2020-11-07 NOTE — Telephone Encounter (Signed)
Detail vm left for patient to provide information to where it should be faxed

## 2020-11-08 NOTE — Telephone Encounter (Signed)
Dr Jodi Mourning fax [phone]

## 2020-11-10 ENCOUNTER — Telehealth: Payer: Self-pay | Admitting: Interventional Cardiology

## 2020-11-10 DIAGNOSIS — R899 Unspecified abnormal finding in specimens from other organs, systems and tissues: Secondary | ICD-10-CM | POA: Diagnosis not present

## 2020-11-10 DIAGNOSIS — R110 Nausea: Secondary | ICD-10-CM | POA: Diagnosis not present

## 2020-11-10 DIAGNOSIS — R42 Dizziness and giddiness: Secondary | ICD-10-CM | POA: Diagnosis not present

## 2020-11-10 DIAGNOSIS — R748 Abnormal levels of other serum enzymes: Secondary | ICD-10-CM | POA: Diagnosis not present

## 2020-11-10 DIAGNOSIS — R6889 Other general symptoms and signs: Secondary | ICD-10-CM | POA: Diagnosis not present

## 2020-11-10 DIAGNOSIS — R630 Anorexia: Secondary | ICD-10-CM | POA: Diagnosis not present

## 2020-11-10 NOTE — Telephone Encounter (Signed)
I left message on Anna Mathews's voicemail requesting office note from today's visit be faxed to our office.

## 2020-11-10 NOTE — Telephone Encounter (Signed)
Dr. Jacelyn Grip saw patient today and stop her chlorthalidone (HYGROTON) 25 MG tablet due to rising creatinine and BUN, and feeling nausea and dizzy.  She has seen Endo, GI and ER.  I am holding it for 5 days and will see her back next Monday or Tuesday.  I would like to touch base with you next week when I see her again.

## 2020-11-11 NOTE — Telephone Encounter (Signed)
Spoke with the patient and advised her that Dr. Irish Lack is in agreement with Dr. Jacelyn Grip holding her chlorthalidone. Advised her to monitor her BP at home.

## 2020-11-11 NOTE — Telephone Encounter (Signed)
I agree with holding diuretic.  Monitor BP at home.   JV

## 2020-11-14 DIAGNOSIS — R7989 Other specified abnormal findings of blood chemistry: Secondary | ICD-10-CM | POA: Diagnosis not present

## 2020-11-14 DIAGNOSIS — R630 Anorexia: Secondary | ICD-10-CM | POA: Diagnosis not present

## 2020-11-14 DIAGNOSIS — R112 Nausea with vomiting, unspecified: Secondary | ICD-10-CM | POA: Diagnosis not present

## 2020-11-14 DIAGNOSIS — R110 Nausea: Secondary | ICD-10-CM | POA: Diagnosis not present

## 2020-11-14 DIAGNOSIS — K5900 Constipation, unspecified: Secondary | ICD-10-CM | POA: Diagnosis not present

## 2020-11-14 DIAGNOSIS — R42 Dizziness and giddiness: Secondary | ICD-10-CM | POA: Diagnosis not present

## 2020-11-14 DIAGNOSIS — R899 Unspecified abnormal finding in specimens from other organs, systems and tissues: Secondary | ICD-10-CM | POA: Diagnosis not present

## 2020-11-14 DIAGNOSIS — R6889 Other general symptoms and signs: Secondary | ICD-10-CM | POA: Diagnosis not present

## 2020-11-16 ENCOUNTER — Telehealth: Payer: Self-pay | Admitting: Interventional Cardiology

## 2020-11-16 NOTE — Telephone Encounter (Signed)
Spoke with the patient who reports that she followed up with Dr. Jacelyn Grip this week. She has completely stopped chlorthalidone and was advised to stay off of it. She was told Dr. Jacelyn Grip would be reaching out to [place] to see if there was another medication that we could prescribe for the patient. Patient reports recent blood pressures of 140/65, 135/57 and 144/71. Patient has gotten rid of her list of previous readings. I have advised the patient to keep up with a new log and will send a message to Dr. Irish Lack for advisement on addition of any other blood pressure medications.

## 2020-11-16 NOTE — Telephone Encounter (Signed)
Dr. Jodi Mourning note not yet scanned in the system.  As of 5/22 wen I saw her, she was taking amlodipine.  I see a dermatology note saying that the amlodipine was stopped and chlorthalidone was started but there may be a rash caused by that, so clorthalidone was stopped.  Can we get an updated list of what she is currently taking.

## 2020-11-16 NOTE — Telephone Encounter (Signed)
She called stating the the new BP medication she was put on is not working for her, it's not agreeing with her body.  She doesn't know the name of it.  Dr. Jacelyn Grip advised her to give [place] a call so we can put her on something else.

## 2020-11-17 NOTE — Telephone Encounter (Signed)
Anna Mathews was her reaction to amlodipine.  SHe was on that for some time and it was stopped in 2022.  I belive she was on it when I saw her earlier in the year.   I would consider adding back amlodipine 2.5 mg daily if she is not allergic.   Anna Booze, MD

## 2020-11-17 NOTE — Telephone Encounter (Signed)
BP 11/16/20 142/58 71 AM  151/69 68 Lunch 116/56 67 Evening  Patient medication list in the system is currently accurate.

## 2020-11-17 NOTE — Telephone Encounter (Signed)
Patient stated she was on Amlodipine for 10+ years with no problems. 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 It was noted by our pharmacist:   Schamberg's disease is the most common cause of capillaritis and diagnosed by biopsy. Given that calcium channel blockers cause peripheral vasodilation, cannot rule out amlodipine as the cause. There is a case report from 2015 that a patient developed Schamberg's disease 3 months after starting amlodipine that resolved after being stopped for 7 months and recurred once restarted. Another case report from 2017 details a similar story but resolved after 1 month of discontinuing amlodipine. The cause appears to be generally idiopathic. It also appears to occur primarily on the lower extremities. The patient is describing an itchy rash on legs that have spread to arms, chest, and face. The above case studies recommend early discontinuation of amlodipine in patients diagnosed with Schamberg's disease to prevent progression of skin lesions. Could consider holding amlodipine, though based on case reports it may take >1 month and upwards of 6 months for lesions to resolve if amlodipine is the cause. If she wants to stop the amlodipine, recommend starting chlorthalidone 25 mg daily for BP lowering. Will need to check a bmet after 2 weeks of starting.    When she started the Chlorthalidone her rash, "went crazy right away." She would like to try restarting the amlodipine and see if it makes a difference with her skin or if she is okay.  Will forward to Dr. Irish Lack for agreement.

## 2020-11-17 NOTE — Telephone Encounter (Signed)
Left a message requesting that pt call back tomorrow as it is now 5:09 pm.

## 2020-11-18 MED ORDER — AMLODIPINE BESYLATE 2.5 MG PO TABS: 2.5000 mg | ORAL_TABLET | Freq: Every day | ORAL | 11 refills | Status: DC

## 2020-11-18 NOTE — Telephone Encounter (Signed)
Follow Up:    Patient is returning Shamea's call from yesterday.

## 2020-11-18 NOTE — Telephone Encounter (Signed)
Jettie Booze, MD  4:58 PM OK to try amlodipine 2.5 mg daily.   JV   Gave patient recommendations. Sent in prescription.  Verbalized agreement and understanding.

## 2020-11-22 NOTE — Telephone Encounter (Signed)
Patient is calling back because she is still have issues with her bp. Please advise

## 2020-11-22 NOTE — Telephone Encounter (Signed)
Agree with stopping amlodipine. Will check with PharmD as ti which antiHTN meds would be least likely to cause skin issues.   JV

## 2020-11-22 NOTE — Telephone Encounter (Signed)
Pt started taking Amlodipine on Saturday and BPs improved to 147/69, 137/72, 110/52.  On Monday 129/51, 117/62 and she felt great.  Today BP was 84/54, 83/56 and 71/48 and was dizzy.  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  Today, after walking the dogs, noticed that her arms and chest are now red and she's itching and has a stinging feeling.  She states this is what happens when her Roselyn Reef flares up.  She feels this is related to Amlodipine because the same thing happened when she took Amlodipine previously. Denies difficulty breathing or swelling.  Advised that she not take anymore Amlodipine and I will send message to Dr. Irish Lack for review and advisement.  Pt appreciative for call.

## 2020-11-23 NOTE — Telephone Encounter (Addendum)
Patient intolerant to chlorthalidone and amlodipine.  Can consider trying quinapril monotherapy and see if it controls blood pressure on its own since patient is now monitoring.  Would avoid beta blockers due to pulse rate of 61 at last visit. If quinapril does not control BP alone, could consider spironolactone 12.24m once daily and check BMP in 1 week later due to reduced eGFR.    Added amlodipine to allergy list.

## 2020-11-23 NOTE — Addendum Note (Signed)
Addended by: Rollen Sox on: 11/23/2020 07:37 AM   Modules accepted: Orders

## 2020-11-24 NOTE — Telephone Encounter (Signed)
Returned call to patient and reviewed advice from Karren Cobble, [facility]. Patient reports she takes quinapril at 10:30 am. Advised her to monitor BP each day at least 2 hours after she takes her medication and call back to report in 5-7 days. I advised that if BP is elevated, we will start spironolactone 12.5 mg. Patient verbalized understanding and agreement with plan and thanked me for the call.

## 2020-12-01 NOTE — Telephone Encounter (Signed)
Pt c/o BP issue: STAT if pt c/o blurred vision, one-sided weakness or slurred speech  1. What are your last 5 BP readings?   11/05: 152/71 68 11/08: 151/73 63 (stopped taking Amlodipine) 105/50 67  11/09: 154/75  67 (before Quinapril)  135/52 66 10/10: 75/48 68   2. Are you having any other symptoms (ex. Dizziness, headache, blurred vision, passed out)?  Pins and needles--started in ankles, radiated up to legs and arms  3. What is your BP issue?   Patient is following up to report her BP readings. She states her BP has been dropping since starting Quinapril 40 MG.

## 2020-12-02 DIAGNOSIS — R899 Unspecified abnormal finding in specimens from other organs, systems and tissues: Secondary | ICD-10-CM | POA: Diagnosis not present

## 2020-12-02 DIAGNOSIS — R630 Anorexia: Secondary | ICD-10-CM | POA: Diagnosis not present

## 2020-12-02 DIAGNOSIS — R110 Nausea: Secondary | ICD-10-CM | POA: Diagnosis not present

## 2020-12-02 DIAGNOSIS — R7989 Other specified abnormal findings of blood chemistry: Secondary | ICD-10-CM | POA: Diagnosis not present

## 2020-12-02 DIAGNOSIS — R42 Dizziness and giddiness: Secondary | ICD-10-CM | POA: Diagnosis not present

## 2020-12-02 MED ORDER — QUINAPRIL HCL 20 MG PO TABS: 20.0000 mg | ORAL_TABLET | Freq: Two times a day (BID) | ORAL | 1 refills | Status: DC

## 2020-12-02 NOTE — Telephone Encounter (Signed)
Given the big drop in BP after quinapril, would change it from 40 mg daily to 20 mg BID to see if there is less of an acute drop in BP.  If BP is low at the time of the evening dose of quinapril, would hold that dose and take the next dose the following AM.

## 2020-12-02 NOTE — Telephone Encounter (Signed)
Patient notified. She will hold evening dose if systolic BP less than 290.   Prescription sent to [facility] on [address].  Patient reports she continues to have rash.

## 2020-12-02 NOTE — Addendum Note (Signed)
Addended by: Leodis Liverpool L on: 12/02/2020 11:40 AM   Modules accepted: Orders

## 2020-12-07 ENCOUNTER — Telehealth: Payer: Self-pay | Admitting: Interventional Cardiology

## 2020-12-07 NOTE — Telephone Encounter (Signed)
  Pt c/o medication issue:  1. Name of Medication: quinapril (ACCUPRIL) 20 MG tablet  2. How are you currently taking this medication (dosage and times per day)? Take 1 tablet (20 mg total) by mouth 2 (two) times daily.  3. Are you having a reaction (difficulty breathing--STAT)? no  4. What is your medication issue? Bp was 92/62; 113/70. Patient hasnt taking a pill this morning but she dont know if she should being that her bp is over less then 100. Patient is scared because her body feel funny. Please advise    She wants dr Curt Bears and Livingston Diones opionion on everything. Including flecainide (TAMBOCOR) 150 MG tablet and ELIQUIS 5 MG TABS tablet

## 2020-12-07 NOTE — Telephone Encounter (Signed)
Pt reports low BPs. States she just decreased Quanapril Friday from 40 to 20 mg BID, and was instructed to hold her evening dose is SBP is less than 100. Morning BPs were 92/62 & 113/70, she did not take it this morning and called for advisement. No symptoms associated w/ low BPs. Pt advised to continue holding for SBPs less than 100 until she hears back from office. Aware will forward to MD for advisement and most likely will have her hold both morning and night if SBP less than 100. Pt aware will call once Dr. Irish Lack advised.    She also reports she can feel abnormal pulse when she checks it. She canNOT "feel" her afib, she only knows when she checks her pulse. Reports some HRs jumping into the 70s - 80s. Denies any symptoms associated with this, she just simply noticed irregular pulse recently. Pt advised that if she feels she has been out of rhythm for more than 24 - 48 hours to let office know.  If she develops symptoms let us know. Also advised that if she notices pulse is irregular frequently, but not necessarily constant, to also let us know as we may to discuss a monitor for afib burden. Pt is agreeable to plan.

## 2020-12-08 NOTE — Telephone Encounter (Signed)
Patient called to give her BP readings The morning before meds it was 148/75 HR 67 After meds at 12pm it was           93/52   HR 68 She took it again to make sure    107/52 HR 65  She still feels a little weak. She said she took her quinapril 20mg  and her Eliquis and her flecanide this morning.  She stated you can call her back after 4:30pm today.

## 2020-12-09 MED ORDER — QUINAPRIL HCL 10 MG PO TABS
10.0000 mg | ORAL_TABLET | Freq: Two times a day (BID) | ORAL | 2 refills | Status: DC
Start: 2020-12-09 — End: 2021-01-06

## 2020-12-09 NOTE — Telephone Encounter (Signed)
Would change quinapril to 10 mg BID to give a more even BP lowering effect, and avoid low readings.   JV    LMTCB

## 2020-12-09 NOTE — Telephone Encounter (Signed)
Patient notified.  Last night she checked her BP three times in a row.  Readings were 102/51, 95/46 and then 94/44. She did not take quinapril. Today she checked BP three times in a row and it was 156/78, 147/77,154/75.  After taking medications today BP was 117/50, 108/96.  I advised patient not to check back to back if possible.  Will send prescription for quinapril 10 mg to [facility] on [address].  Patient will continue to monitor BP and hold quinapril if BP less than 100.

## 2020-12-13 ENCOUNTER — Telehealth: Payer: Self-pay | Admitting: Interventional Cardiology

## 2020-12-13 DIAGNOSIS — I10 Essential (primary) hypertension: Secondary | ICD-10-CM

## 2020-12-13 NOTE — Telephone Encounter (Signed)
I spoke with patient. She reports last night at 8:30 PM her BP was 116/59 and at 11:45 PM it was 168/63. Today she reports the following readings- 6 AM- 175/75, 72 8:15 AM- 143/75--she took AM meds at this time 1:30 PM-70/46.  She felt dizzy at that time 2:15- 78/47. She is having problems with her skin stinging and being red all over.  Patient checked BP while on the phone with me and it is currently 150/61. Patient states she did eat normal breakfast and lunch today but fluid intake has been less than usual.  Had about one glass of water today. I advised patient to stay hydrated and to hold PM dose of quinapril today.  She will hold AM dose of quinapril tomorrow if BP less than 100. Will send to Dr Irish Lack for further instructions

## 2020-12-13 NOTE — Telephone Encounter (Signed)
Pt c/o BP issue: STAT if pt c/o blurred vision, one-sided weakness or slurred speech  1. What are your last 5 BP readings? 78/47;63; 70/46; 69; 123/70; 61; 155/65; 65; 145/86; 65  2. Are you having any other symptoms (ex. Dizziness, headache, blurred vision, passed out)? Purpura is back and it stings; headache; lightheadedness  3. What is your BP issue? Patient is scared due to the fluctuating of her blood pressure and is scared she will drop dead.

## 2020-12-13 NOTE — Telephone Encounter (Signed)
Stop quinapril.  Start irbesartan 75 mg daily.  Call in to [facility] on [address] Continue to monitor BP.  If she is having high readings in the evening( > 292 systolic)  can take a second dose of irbesartan 75 mg  at night.  BMet in 1 week.    I called the patient and dicussed the plan  Jettie Booze, MD

## 2020-12-14 MED ORDER — IRBESARTAN 75 MG PO TABS: 75.0000 mg | ORAL_TABLET | Freq: Every day | ORAL | 3 refills | Status: DC

## 2020-12-14 NOTE — Telephone Encounter (Signed)
Rx has been sent in for irbesartan 75mg  daily to patient's requested pharmacy. I have scheduled her for lab work in one week on 11/30.  Called patient and left her a message with this information. Advised to call back with any questions or to reschedule lab work if she is unavailable that day.

## 2020-12-19 DIAGNOSIS — K5900 Constipation, unspecified: Secondary | ICD-10-CM | POA: Diagnosis not present

## 2020-12-21 ENCOUNTER — Other Ambulatory Visit: Payer: Medicare PPO | Admitting: *Deleted

## 2020-12-21 ENCOUNTER — Other Ambulatory Visit: Payer: Self-pay

## 2020-12-21 DIAGNOSIS — I10 Essential (primary) hypertension: Secondary | ICD-10-CM | POA: Diagnosis not present

## 2020-12-21 LAB — BASIC METABOLIC PANEL
BUN/Creatinine Ratio: 27 (ref 12–28)
BUN: 29 mg/dL — ABNORMAL HIGH (ref 8–27)
CO2: 30 mmol/L — ABNORMAL HIGH (ref 20–29)
Calcium: 9.7 mg/dL (ref 8.7–10.3)
Chloride: 103 mmol/L (ref 96–106)
Creatinine, Ser: 1.07 mg/dL — ABNORMAL HIGH (ref 0.57–1.00)
Glucose: 101 mg/dL — ABNORMAL HIGH (ref 70–99)
Potassium: 4.1 mmol/L (ref 3.5–5.2)
Sodium: 143 mmol/L (ref 134–144)
eGFR: 52 mL/min/{1.73_m2} — ABNORMAL LOW (ref >59)

## 2020-12-23 ENCOUNTER — Telehealth: Payer: Self-pay | Admitting: Interventional Cardiology

## 2020-12-23 NOTE — Telephone Encounter (Signed)
I spoke with patient. She reports BP was 151/76 last night so she took extra losartan. This morning her BP was 96/49 at 9:00 so she did not take Losartan. Her BP is now 100/54.  She will check BP tonight around 6 PM and if above 150 will take losartan at that time.

## 2020-12-23 NOTE — Telephone Encounter (Signed)
Left message to call office

## 2020-12-23 NOTE — Telephone Encounter (Signed)
Pt c/o BP issue: STAT if pt c/o blurred vision, one-sided weakness or slurred speech  1. What are your last 5 BP readings?  151/72 HR 59 96/49   HR 66 100/54 HR 68  2. Are you having any other symptoms (ex. Dizziness, headache, blurred vision, passed out)? no  3. What is your BP issue? BP running on low side.  She took her medication irbesartan (AVAPRO) 75 MG tablet last night.  She didn't take it this morning because her BP was on the low side.  She states she has been very dizzy with the medication at times.

## 2021-01-06 ENCOUNTER — Telehealth: Payer: Self-pay

## 2021-01-06 MED ORDER — IRBESARTAN 75 MG PO TABS: 75.0000 mg | ORAL_TABLET | Freq: Every day | ORAL | 3 refills | Status: DC

## 2021-01-06 NOTE — Telephone Encounter (Signed)
Returned call to Pt.  Advised per Dr. Irish Lack, she should take her scheduled irbesartan at bedtime.  Then she should only take a morning dose of irbesartan if her BP is greater than 256 systolic.  Asked Pt how her BP has been running.  She states she thinks it has been doing ok.  States she rarely has to take an extra irbesartan.  Not sure Pt is taking quinapril.  Advised covering nurse of this.  Pt understands instructions and will call if taking medication at bedtime doesn't work to relieve headaches.

## 2021-01-06 NOTE — Telephone Encounter (Signed)
Returned call to Pt.  Advised would send to Dr. Irish Lack.  Advised may not hear back today, but we would call as soon as we could.  Pt thanked nurse for letting her know.

## 2021-01-06 NOTE — Telephone Encounter (Signed)
Quinapril was stopped on 11/22.  I placed call to patient and confirmed she is not taking.

## 2021-01-06 NOTE — Addendum Note (Signed)
Addended by: Thompson Grayer on: 01/06/2021 02:15 PM   Modules accepted: Orders

## 2021-01-06 NOTE — Telephone Encounter (Signed)
Pt c/o medication issue:  1. Name of Medication:  irbesartan (AVAPRO) 75 MG tablet  2. How are you currently taking this medication (dosage and times per day)? As prescribed   3. Are you having a reaction (difficulty breathing--STAT)? Yes  4. What is your medication issue? Anna Mathews is calling stating that her irbesartan is still causing her dizziness and a dull headache in the mornings that last until 2-3 in the afternoon. She reports she is taking it around 9:00 AM every morning and is unsure if she should continue doing this due to that.

## 2021-01-06 NOTE — Telephone Encounter (Signed)
Anna Mathews N at 01/06/2021  1:01 PM  Status: Signed  Pt c/o medication issue:   1. Name of Medication:  irbesartan (AVAPRO) 75 MG tablet   2. How are you currently taking this medication (dosage and times per day)? As prescribed    3. Are you having a reaction (difficulty breathing--STAT)? Yes   4. What is your medication issue? [name] is calling stating that her irbesartan is still causing her dizziness and a dull headache in the mornings that last until 2-3 in the afternoon. She reports she is taking it around 9:00 AM every morning and is unsure if she should continue doing this due to that.

## 2021-01-18 DIAGNOSIS — H26491 Other secondary cataract, right eye: Secondary | ICD-10-CM | POA: Diagnosis not present

## 2021-01-18 DIAGNOSIS — H2512 Age-related nuclear cataract, left eye: Secondary | ICD-10-CM | POA: Diagnosis not present

## 2021-01-18 DIAGNOSIS — Z961 Presence of intraocular lens: Secondary | ICD-10-CM | POA: Diagnosis not present

## 2021-01-19 DIAGNOSIS — Z853 Personal history of malignant neoplasm of breast: Secondary | ICD-10-CM | POA: Diagnosis not present

## 2021-01-19 DIAGNOSIS — R922 Inconclusive mammogram: Secondary | ICD-10-CM | POA: Diagnosis not present

## 2021-01-20 DIAGNOSIS — Z0000 Encounter for general adult medical examination without abnormal findings: Secondary | ICD-10-CM | POA: Diagnosis not present

## 2021-01-20 DIAGNOSIS — Z1389 Encounter for screening for other disorder: Secondary | ICD-10-CM | POA: Diagnosis not present

## 2021-02-02 DIAGNOSIS — I480 Paroxysmal atrial fibrillation: Secondary | ICD-10-CM | POA: Diagnosis not present

## 2021-02-02 DIAGNOSIS — I10 Essential (primary) hypertension: Secondary | ICD-10-CM | POA: Diagnosis not present

## 2021-02-02 DIAGNOSIS — E782 Mixed hyperlipidemia: Secondary | ICD-10-CM | POA: Diagnosis not present

## 2021-02-02 DIAGNOSIS — R7303 Prediabetes: Secondary | ICD-10-CM | POA: Diagnosis not present

## 2021-02-02 DIAGNOSIS — L817 Pigmented purpuric dermatosis: Secondary | ICD-10-CM | POA: Diagnosis not present

## 2021-02-03 ENCOUNTER — Emergency Department (HOSPITAL_BASED_OUTPATIENT_CLINIC_OR_DEPARTMENT_OTHER): Payer: Medicare PPO

## 2021-02-03 ENCOUNTER — Telehealth: Payer: Self-pay | Admitting: Interventional Cardiology

## 2021-02-03 ENCOUNTER — Encounter (HOSPITAL_BASED_OUTPATIENT_CLINIC_OR_DEPARTMENT_OTHER): Payer: Self-pay | Admitting: Emergency Medicine

## 2021-02-03 ENCOUNTER — Other Ambulatory Visit: Payer: Self-pay

## 2021-02-03 ENCOUNTER — Observation Stay (HOSPITAL_BASED_OUTPATIENT_CLINIC_OR_DEPARTMENT_OTHER)
Admission: EM | Admit: 2021-02-03 | Discharge: 2021-02-05 | Disposition: A | Discharge status: Home / Self Care | Payer: Medicare PPO | Attending: Internal Medicine | Admitting: Internal Medicine

## 2021-02-03 DIAGNOSIS — I6523 Occlusion and stenosis of bilateral carotid arteries: Secondary | ICD-10-CM | POA: Diagnosis not present

## 2021-02-03 DIAGNOSIS — Z20822 Contact with and (suspected) exposure to covid-19: Secondary | ICD-10-CM | POA: Insufficient documentation

## 2021-02-03 DIAGNOSIS — I9589 Other hypotension: Secondary | ICD-10-CM

## 2021-02-03 DIAGNOSIS — Z853 Personal history of malignant neoplasm of breast: Secondary | ICD-10-CM | POA: Insufficient documentation

## 2021-02-03 DIAGNOSIS — I10 Essential (primary) hypertension: Secondary | ICD-10-CM | POA: Insufficient documentation

## 2021-02-03 DIAGNOSIS — I160 Hypertensive urgency: Principal | ICD-10-CM

## 2021-02-03 DIAGNOSIS — R42 Dizziness and giddiness: Secondary | ICD-10-CM | POA: Diagnosis not present

## 2021-02-03 DIAGNOSIS — Z79899 Other long term (current) drug therapy: Secondary | ICD-10-CM | POA: Diagnosis not present

## 2021-02-03 DIAGNOSIS — R519 Headache, unspecified: Secondary | ICD-10-CM

## 2021-02-03 DIAGNOSIS — R0989 Other specified symptoms and signs involving the circulatory and respiratory systems: Secondary | ICD-10-CM | POA: Diagnosis not present

## 2021-02-03 DIAGNOSIS — Z7901 Long term (current) use of anticoagulants: Secondary | ICD-10-CM | POA: Diagnosis not present

## 2021-02-03 DIAGNOSIS — G459 Transient cerebral ischemic attack, unspecified: Secondary | ICD-10-CM | POA: Insufficient documentation

## 2021-02-03 DIAGNOSIS — Z87891 Personal history of nicotine dependence: Secondary | ICD-10-CM | POA: Diagnosis not present

## 2021-02-03 DIAGNOSIS — Z7982 Long term (current) use of aspirin: Secondary | ICD-10-CM | POA: Insufficient documentation

## 2021-02-03 DIAGNOSIS — I6502 Occlusion and stenosis of left vertebral artery: Secondary | ICD-10-CM | POA: Diagnosis not present

## 2021-02-03 DIAGNOSIS — I959 Hypotension, unspecified: Secondary | ICD-10-CM | POA: Diagnosis not present

## 2021-02-03 DIAGNOSIS — I480 Paroxysmal atrial fibrillation: Secondary | ICD-10-CM | POA: Diagnosis present

## 2021-02-03 DIAGNOSIS — N179 Acute kidney failure, unspecified: Secondary | ICD-10-CM | POA: Insufficient documentation

## 2021-02-03 LAB — DIFFERENTIAL
Abs Immature Granulocytes: 0.01 10*3/uL (ref 0.00–0.07)
Basophils Absolute: 0 10*3/uL (ref 0.0–0.1)
Basophils Relative: 0 %
Eosinophils Absolute: 0.2 10*3/uL (ref 0.0–0.5)
Eosinophils Relative: 3 %
Immature Granulocytes: 0 %
Lymphocytes Relative: 17 %
Lymphs Abs: 1 10*3/uL (ref 0.7–4.0)
Monocytes Absolute: 0.4 10*3/uL (ref 0.1–1.0)
Monocytes Relative: 8 %
Neutro Abs: 4.2 10*3/uL (ref 1.7–7.7)
Neutrophils Relative %: 72 %

## 2021-02-03 LAB — COMPREHENSIVE METABOLIC PANEL
ALT: 15 U/L (ref 0–44)
AST: 18 U/L (ref 15–41)
Albumin: 4.5 g/dL (ref 3.5–5.0)
Alkaline Phosphatase: 62 U/L (ref 38–126)
Anion gap: 9 (ref 5–15)
BUN: 24 mg/dL — ABNORMAL HIGH (ref 8–23)
CO2: 30 mmol/L (ref 22–32)
Calcium: 10 mg/dL (ref 8.9–10.3)
Chloride: 103 mmol/L (ref 98–111)
Creatinine, Ser: 0.97 mg/dL (ref 0.44–1.00)
GFR, Estimated: 58 mL/min — ABNORMAL LOW (ref >60)
Glucose, Bld: 99 mg/dL (ref 70–99)
Potassium: 3.9 mmol/L (ref 3.5–5.1)
Sodium: 142 mmol/L (ref 135–145)
Total Bilirubin: 0.9 mg/dL (ref 0.3–1.2)
Total Protein: 7.3 g/dL (ref 6.5–8.1)

## 2021-02-03 LAB — CBC
HCT: 43.4 % (ref 36.0–46.0)
Hemoglobin: 13.8 g/dL (ref 12.0–15.0)
MCH: 30.3 pg (ref 26.0–34.0)
MCHC: 31.8 g/dL (ref 30.0–36.0)
MCV: 95.4 fL (ref 80.0–100.0)
Platelets: 232 10*3/uL (ref 150–400)
RBC: 4.55 MIL/uL (ref 3.87–5.11)
RDW: 12.5 % (ref 11.5–15.5)
WBC: 5.8 10*3/uL (ref 4.0–10.5)
nRBC: 0 % (ref 0.0–0.2)

## 2021-02-03 LAB — TROPONIN I (HIGH SENSITIVITY)
Troponin I (High Sensitivity): 10 ng/L (ref <18)
Troponin I (High Sensitivity): 46 ng/L — ABNORMAL HIGH (ref <18)
Troponin I (High Sensitivity): 33 ng/L — ABNORMAL HIGH (ref <18)

## 2021-02-03 LAB — APTT: aPTT: 36 seconds (ref 24–36)

## 2021-02-03 LAB — RESP PANEL BY RT-PCR (FLU A&B, COVID) ARPGX2
Influenza A by PCR: NEGATIVE
Influenza B by PCR: NEGATIVE
SARS Coronavirus 2 by RT PCR: NEGATIVE

## 2021-02-03 LAB — PROTIME-INR
INR: 1.3 — ABNORMAL HIGH (ref 0.8–1.2)
Prothrombin Time: 16.2 seconds — ABNORMAL HIGH (ref 11.4–15.2)

## 2021-02-03 MED ORDER — IBUPROFEN 400 MG PO TABS
600.0000 mg | ORAL_TABLET | Freq: Once | ORAL | Status: AC
  Administered 2021-02-03: 600 mg via ORAL
  Filled 2021-02-03: qty 1

## 2021-02-03 MED ORDER — LABETALOL HCL 5 MG/ML IV SOLN: 5.0000 mg | Freq: Four times a day (QID) | INTRAVENOUS | Status: DC | PRN

## 2021-02-03 MED ORDER — HYDRALAZINE HCL 20 MG/ML IJ SOLN
10.0000 mg | Freq: Once | INTRAMUSCULAR | Status: AC
  Administered 2021-02-03: 10 mg via INTRAVENOUS
  Filled 2021-02-03: qty 1

## 2021-02-03 MED ORDER — HYDRALAZINE HCL 20 MG/ML IJ SOLN
5.0000 mg | Freq: Once | INTRAMUSCULAR | Status: AC
  Administered 2021-02-03: 5 mg via INTRAVENOUS
  Filled 2021-02-03: qty 1

## 2021-02-03 MED ORDER — SODIUM CHLORIDE 0.9 % IV BOLUS
500.0000 mL | Freq: Once | INTRAVENOUS | Status: AC
  Administered 2021-02-03: 500 mL via INTRAVENOUS

## 2021-02-03 MED ORDER — ACETAMINOPHEN 500 MG PO TABS
1000.0000 mg | ORAL_TABLET | Freq: Once | ORAL | Status: AC
  Administered 2021-02-03: 1000 mg via ORAL
  Filled 2021-02-03: qty 2

## 2021-02-03 MED ORDER — APIXABAN 5 MG PO TABS
5.0000 mg | ORAL_TABLET | Freq: Two times a day (BID) | ORAL | Status: DC
  Administered 2021-02-04 – 2021-02-05 (×4): 5 mg via ORAL
  Filled 2021-02-03 (×4): qty 1

## 2021-02-03 MED ORDER — IOHEXOL 350 MG/ML SOLN
100.0000 mL | Freq: Once | INTRAVENOUS | Status: AC | PRN
  Administered 2021-02-03: 100 mL via INTRAVENOUS

## 2021-02-03 MED ORDER — ASPIRIN 325 MG PO TABS
162.0000 mg | ORAL_TABLET | Freq: Every day | ORAL | Status: DC
  Administered 2021-02-03: 162 mg via ORAL
  Filled 2021-02-03: qty 1

## 2021-02-03 NOTE — Telephone Encounter (Addendum)
Pt c/o BP issue: STAT if pt c/o blurred vision, one-sided weakness or slurred speech  1. What are your last 5 BP readings? 206/85  2. Are you having any other symptoms (ex. Dizziness, headache, blurred vision, passed out)? Left side neck up feels funny.  Has a little bit of a headache.   3. What is your BP issue? Elevated BP.  They are on their way to the ER.

## 2021-02-03 NOTE — ED Notes (Signed)
Patient transported to CT 

## 2021-02-03 NOTE — Telephone Encounter (Signed)
WOuld take an additional 75 mg of irbesartan.  Max dose is 300 mg of irbesartan in 1 day.  If BP still increased after an hour after additional dose, would take another 75 mg.    Looks like she may have gone to ER.   Hopefuully she does not have to wait too long.  Please send her this message so she will get once she gets home. Thanks.

## 2021-02-03 NOTE — ED Provider Notes (Signed)
[facility] EMERGENC[facility] DEPT Provider Note   CSN: [id number] Arrival date & time: 02/03/21  1011     History  Chief Complaint  Patient presents with   Dizziness    Anna Mathews is a 81 y.o. female.  HPI Patient reports has been having problems with labile blood pressure since an episode of waxing and waning hot and tingly episodes associated with purpura or petechiae.  This is a phenomenon that started about 4 months ago, early September.  Patient reports that she would get a feeling of heat and tingling is coming up the entirety of her body and then there would be a rash that, as she describes it looked like red spots and then would resolve sometimes within a few hours.  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  Patient was discontinued off of amlodipine.  Since that time, her blood pressures have been labile.  Reports has been tried on 4 different blood pressure medications without much success.  Denies her blood pressures are well controlled and normotensive.  She reports yesterday morning she had normal blood pressures and yesterday evening they were normal.  Her current instructions are to hold her evening dose of the irbesartan if sugars are less than 916 systolic.  This morning she got up into her blood pressure was up to 220.  She reports she took her irbesartan and think she took an additional 1 is well pressures remain elevated.  She reports that she also feels like she has a pressure sensation on the side of her neck and head on the left.  Not a severe headache but an uncomfortable sensation.  No other focal weakness numbness tingling or dysfunction.  No visual changes.    Home Medications Prior to Admission medications   Medication Sig Start Date End Date Taking? Authorizing Provider  acetaminophen (TYLENOL) 500 MG tablet Take 500-1,000 mg by mouth every 6 (six) hours as needed (pain.).     [provider]  aspirin 81 MG chewable tablet Chew 81 mg by mouth daily.    [provider]  Biotin 5000 MCG TABS Take 5,000 mcg by mouth daily.    [provider]  Calcium-Magnesium-Vitamin D (CALCIUM 1200+D3 PO) Take 1 tablet by mouth in the morning and at bedtime.    [provider]  Camphor-Menthol-Methyl Sal (SALONPAS) 3.01-28-08 % PTCH Place 1 patch onto the skin daily as needed (knee/back pain.).    [provider]  ELIQUIS 5 MG TABS tablet TAKE 1 TABLET BY MOUTH TWICE A DAY 11/07/20   Camnitz, Ocie Doyne, MD  flecainide (TAMBOCOR) 150 MG tablet Take 1 tablet (150 mg total) by mouth 2 (two) times daily. 09/28/20   Camnitz, Will Hassell Done, MD  irbesartan (AVAPRO) 75 MG tablet Take 1 tablet (75 mg total) by mouth daily. Take 1 tablet (75 mg total) every evening. If systolic BP >384 can take an extra tablet (75 mg) in the morning. 01/06/21   Jettie Booze, MD  Omega-3 Fatty Acids (FISH OIL) 1200 MG CAPS Take 1,200 mg by mouth in the morning and at bedtime.    [provider]  ondansetron (ZOFRAN-ODT) 4 MG disintegrating tablet Take 4 mg by mouth every 8 (eight) hours as needed for nausea or vomiting.    [provider]  Polyethyl Glycol-Propyl Glycol (LUBRICANT EYE DROPS) 0.4-0.3 % SOLN Place 1 drop into both eyes 3 (three) times daily as needed (dry/irritated  eyes).    [provider]  PROLIA 60 MG/ML SOSY injection INJECT 60MG  SUBCUTANEOUSLY  INTO UPPER ARM, THIGH, OR  ABDOMEN EVERY 6 MONTHS  (GIVEN AT [facility]  OFFICE) Patient taking differently: Inject 60 mg into the skin every 6 (six) months. 11/18/17   Salvadore Dom, MD  simvastatin (ZOCOR) 20 MG tablet Take 20 mg by mouth every evening.  02/23/13   [provider]      Allergies    Penicillins, Amlodipine, Atorvastatin, Crab [shellfish allergy], and Sulfa antibiotics    Review of Systems   Review of Systems 10 systems reviewed and negative  except as per HPI Physical Exam Updated Vital Signs BP (!) 151/53 (BP Location: Right Arm)    Pulse 64    Temp 98.1 F (36.7 C)    Resp 18    Ht 5' 3.5" (1.613 m)    Wt 62.1 kg    LMP 01/23/1988 (Approximate)    SpO2 99%    BMI 23.89 kg/m  Physical Exam Constitutional:      Appearance: Normal appearance.  HENT:     Nose: Nose normal.     Mouth/Throat:     Mouth: Mucous membranes are moist.     Pharynx: Oropharynx is clear.  Eyes:     Extraocular Movements: Extraocular movements intact.     Pupils: Pupils are equal, round, and reactive to light.  Cardiovascular:     Rate and Rhythm: Normal rate and regular rhythm.  Pulmonary:     Effort: Pulmonary effort is normal.     Breath sounds: Normal breath sounds.  Abdominal:     General: There is no distension.     Palpations: Abdomen is soft.     Tenderness: There is no abdominal tenderness. There is no guarding.  Musculoskeletal:        General: No swelling or tenderness. Normal range of motion.     Right lower leg: No edema.     Left lower leg: No edema.  Skin:    Comments: Patient does not currently have any rash.  On her lower extremity she does have superficial varicose veins.  Skin does not have any petechiae or purpura at this time.  Neurological:     General: No focal deficit present.     Mental Status: She is alert and oriented to person, place, and time.     Motor: No weakness.     Coordination: Coordination normal.  Psychiatric:        Mood and Affect: Mood normal.    ED Results / Procedures / Treatments   Labs (all labs ordered are listed, but only abnormal results are displayed) Labs Reviewed  PROTIME-INR - Abnormal; Notable for the following components:      Result Value   Prothrombin Time 16.2 (*)    INR 1.3 (*)    All other components within normal limits  COMPREHENSIVE METABOLIC PANEL - Abnormal; Notable for the following components:   BUN 24 (*)    GFR, Estimated 58 (*)    All other components within  normal limits  APTT  CBC  DIFFERENTIAL  TROPONIN I (HIGH SENSITIVITY)    EKG EKG Interpretation  Date/Time:  Friday February 03 2021 10:20:39 EST Ventricular Rate:  63 PR Interval:  194 QRS Duration: 144 QT Interval:  448 QTC Calculation: 458 R Axis:   91 Text Interpretation: increased voltage compared to previous, otherwise no sig. interval change Confirmed by Charlesetta Shanks [phone]) on 02/03/2021 4:06:28 PM minimal  change since previous tracing.  Radiology CT HEAD WO CONTRAST  Result Date: 02/03/2021 CLINICAL DATA:  Dizziness EXAM: CT HEAD WITHOUT CONTRAST TECHNIQUE: Contiguous axial images were obtained from the base of the skull through the vertex without intravenous contrast. RADIATION DOSE REDUCTION: This exam was performed according to the departmental dose-optimization program which includes automated exposure control, adjustment of the mA and/or kV according to patient size and/or use of iterative reconstruction technique. COMPARISON:  CT head 05/30/2016 FINDINGS: Brain: There is no evidence of acute intracranial hemorrhage, extra-axial fluid collection, or acute infarct. There is mild global parenchymal volume loss with prominence of the ventricular system and extra-axial CSF spaces. 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 There is no solid mass lesion.  There is no midline shift. Vascular: There is calcification of the bilateral cavernous ICAs. Skull: Normal. Negative for fracture or focal lesion. Sinuses/Orbits: The imaged paranasal sinuses are clear. A right lens implant is noted. The globes and orbits are otherwise unremarkable. Other: None. IMPRESSION: No acute intracranial pathology. Electronically Signed   By: Valetta Mole M.D.   On: 02/03/2021 11:47   CT ANGIO HEAD NECK W WO CM (CODE STROKE)  Result Date: 02/03/2021 CLINICAL DATA:  Provided history: Headache, sudden, severe. EXAM:  CT ANGIOGRAPHY HEAD AND NECK TECHNIQUE: Multidetector CT imaging of the head and neck was performed using the standard protocol during bolus administration of intravenous contrast. Multiplanar CT image reconstructions and MIPs were obtained to evaluate the vascular anatomy. Carotid stenosis measurements (when applicable) are obtained utilizing NASCET criteria, using the distal internal carotid diameter as the denominator. RADIATION DOSE REDUCTION: This exam was performed according to the departmental dose-optimization program which includes automated exposure control, adjustment of the mA and/or kV according to patient size and/or use of iterative reconstruction technique. CONTRAST:  166mL OMNIPAQUE IOHEXOL 350 MG/ML SOLN COMPARISON:  Noncontrast head CT performed earlier today 02/03/2021. CT cervical spine 05/30/2016. FINDINGS: CTA NECK FINDINGS Aortic arch: Standard aortic branching. Atherosclerotic plaque within the visualized aortic arch and proximal major branch vessels of the neck. No hemodynamically significant innominate or proximal subclavian artery stenosis. Right carotid system: CCA and ICA patent within the neck without significant stenosis (50% or greater). Mild atherosclerotic plaque within the distal CCA, about the carotid bifurcation and within the proximal ICA. Left carotid system: CCA and ICA patent within the neck without significant stenosis (50% or greater). Mild atherosclerotic plaque within the distal CCA, about the carotid bifurcation and within the proximal ICA. Vertebral arteries: Vertebral arteries codominant and patent within the neck. Moderate stenosis at the origin of the left vertebral artery. Skeleton: Cervical spondylosis. Presumed chronic (given history) mild T4 vertebral superior endplate deformity. Other neck: 2.4 cm left thyroid lobe nodule. Upper chest: No consolidation within the imaged lung apices. Review of the MIP images confirms the above findings CTA HEAD FINDINGS Anterior  circulation: The intracranial internal carotid arteries are patent. Calcified plaque within both vessels with no more than mild stenosis. The M1 middle cerebral arteries are patent. No M2 proximal branch occlusion or high-grade proximal stenosis is identified. The anterior cerebral arteries are patent. No intracranial aneurysm is identified. Posterior circulation: The intracranial vertebral arteries are patent. The basilar artery is patent. The posterior cerebral arteries are patent. A right posterior communicating artery is present. The left posterior communicating artery is diminutive or absent. Venous sinuses: Within the limitations of contrast timing, no convincing thrombus. Anatomic variants: As described. Review of the  MIP images confirms the above findings IMPRESSION: CTA neck: 1. The common carotid and internal carotid arteries are patent within the neck without hemodynamically significant stenosis. Mild atherosclerotic plaque within both carotid systems within the neck, as described. 2. Vertebral arteries codominant and patent within the neck. Moderate stenosis at the origin of the left vertebral artery. 3. Aortic Atherosclerosis (ICD10-I70.0). 4. 2.4 cm left thyroid lobe nodule. A non-emergent thyroid ultrasound is recommended for further evaluation. CTA head: 1. No intracranial large vessel occlusion or proximal high-grade arterial stenosis. 2. Calcified plaque within the intracranial internal carotid arteries with no more than mild stenosis. Electronically Signed   By: Kellie Simmering D.O.   On: 02/03/2021 14:48    Procedures Procedures  CRITICAL CARE Performed by: Charlesetta Shanks   Total critical care time: 60 minutes  Critical care time was exclusive of separately billable procedures and treating other patients.  Critical care was necessary to treat or prevent imminent or life-threatening deterioration.  Critical care was time spent personally by me on the following activities: development of  treatment plan with patient and/or surrogate as well as nursing, discussions with consultants, evaluation of patient's response to treatment, examination of patient, obtaining history from patient or surrogate, ordering and performing treatments and interventions, ordering and review of laboratory studies, ordering and review of radiographic studies, pulse oximetry and re-evaluation of patient's condition.   Medications Ordered in ED Medications  sodium chloride 0.9 % bolus 500 mL (has no administration in time range)  hydrALAZINE (APRESOLINE) injection 10 mg (10 mg Intravenous Given 02/03/21 1144)  iohexol (OMNIPAQUE) 350 MG/ML injection 100 mL (100 mLs Intravenous Contrast Given 02/03/21 1402)  hydrALAZINE (APRESOLINE) injection 5 mg (5 mg Intravenous Given 02/03/21 1504)  acetaminophen (TYLENOL) tablet 1,000 mg (1,000 mg Oral Given 02/03/21 1502)    ED Course/ Medical Decision Making/ A&P                           Medical Decision Making EMR review for additional history.  Prior labs and consultations as well as diagnostic studies reviewed.  Patient presented with hypertension that has been persistent for about 4 hours.  She did complain of left-sided facial pain and headache which on first description was dull and mild to moderate in quality.  No description of thunderclap headache to immediately raise suspicion of intracranial bleeding.  However, patient is anticoagulated and with hypertension and complaint of vague left facial symptoms, CT noncontrast obtained.  Reviewed by myself without appearance of acute bleed and radiology interpretation reviewed as well.  13: 25 patient is reporting more pain or persisting pain or worsening pain on the left side of her face and neck.  This was a presenting complaint but seemed fairly mild.  Now patient perceives that there is a swelling in numbness to the left side of the face.  Patient's daughter also feels that she can perceive some drooping at the mouth  and some swelling around the cheek and under the eye.  Per my exam, I do not objectively appreciate any change from the previous exam.  I do not appreciate any obvious facial droop.  Neurologic exam is repeated at this time.  Cranial nerves II through XII are intact.  Normal brow elevation eye closure extraocular motions and symmetric smile and tongue protrusion.  Grip strength 5\5 bilaterally.  Finger-nose exam intact bilaterally.  Patient can elevate both lower extremities off of the bed and hold and then resist downward pressure.  At this time due to increased report of facial numbness or dysesthesia combined with family members objective perception of change, will proceed with CT angio.  CT head has already been done and negative for acute findings.  Patient's blood pressure has responded to hydralazine 10mg .  From 220s pressures have been trending now from 419-622 systolic.  I did not want to precipitously drop blood pressure.  Will hold further medication at this time until completion of imaging studies.  After completion of CT angio, patient continues to complain about significant headache on the left side of her head and face.  At that time blood pressures had trended back up to 170s.  A gram of acetaminophen and 5 mg of hydralazine ordered.  Subsequently after recheck, patient's blood pressures had precipitously dropped.  I rechecked the patient at 29:79 and systolic blood pressures had dropped into the 70s.  I did confirm this with an additional read.  Patient reports she feels generally weak.  No change in headache quality.  Will administer normal saline 500 cc bolus.  At this time with patient having gone from significantly hypertensive and potentially hypertensive urgency with CVA\aneurysm and dissection ruled out, now hypotensive, will plan for admission.   Dr. Almyra Free to reassess after fluids and admit.  Final Clinical Impression(s) / ED Diagnoses Final diagnoses:  Hypertensive urgency  Acute  nonintractable headache, unspecified headache type  Hypotension, iatrogenic    Rx / DC Orders ED Discharge Orders     None         Charlesetta Shanks, MD 02/04/21 1842

## 2021-02-03 NOTE — Telephone Encounter (Signed)
LM for the [name] that we received her message and will forward to Dr. Irish Lack for his review.

## 2021-02-03 NOTE — Telephone Encounter (Signed)
Patient being evaluated in ED

## 2021-02-03 NOTE — ED Triage Notes (Signed)
Pt went to bed feeling normal. Woke up at 830 feeling different on her left side and hypertensive. States her BP was 206/85 this morning after medication. Endorses a little dizziness, and lots of pressure to left side of face and neck. No neuro deficits noted in triage.

## 2021-02-03 NOTE — H&P (Signed)
History and Physical    Anna Mathews EVO:[id number] DOB: 12-Jul-1940 DOA: 02/03/2021  PCP: Vernie Shanks, MD  Patient coming from: [place] ED  I have personally briefly reviewed patient's old medical records in [place]  Chief Complaint: headache  HPI: Anna Mathews is a 81 y.o. female with medical history significant for hypertension, paroxysmal atrial fibrillation on Eliquis, GERD and hyperlipidemia who presents as a transfer from outside ED for concerns of hypertensive urgency.  Patient was otherwise in her normal state of health but awoke this morning with severe left-sided headache radiating down to her neck.  She checked her blood pressure and saw systolic above 299.  She then took an extra dose of her irbesartan as instructed by cardiology although might have taken two doses since she was in a panic thinking she was having a stroke.  She has had multiple antihypertensive changes in the past 6 months by cardiology due to labile blood pressure.  She was previously on amlodipine and quinapril for about 15 years.  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  Amlodipine cannot be ruled out as the cause.  Her amlodipine was then switched to chlorthalidone but later discontinued since it was causing labile blood pressure and dizziness. Her quinapril was also later discontinued due to hypotension despite attempts to decrease dosage.  She was started on irbesartan at 75 mg in November and was advised to take extra dose if her blood pressure goes above 150.  In the ED: she was hypertensive up to 220s over 80s and had became hypotensive down to 80 over 30s and bradycardic following a repeat dose of IV 5 mg of hydralazine.  She had received 10 mg IV hydralazine about 4 hours prior to that.  CT head negative.  Troponin trended at 10 -->46-->33.  EKG with no ST or T wave changes, findings are similar to prior. CBC was  unremarkable.  No electrolyte abnormalities on her BMP.  She was negative for flu and COVID.  On arrival [facility] to [facility] she is normotensive and reports an improvement in her headache.  She denies any previous symptoms of blurred vision, chest pain or any extremity weakness.  Review of Systems: Constitutional: No Fever ENT/Mouth: No Rhinorrhea Eyes: No Vision Changes Cardiovascular: No Chest Pain, no SOB, No Palpitations Respiratory: no Dyspnea  Gastrointestinal: No Nausea, No Vomiting, No Diarrhea, No Constipation, No Pain Genitourinary: no Urinary Incontinence Musculoskeletal: No Arthralgias, No Myalgias Skin: No Skin Lesions, No Pruritus, Neuro: no Weakness, No Numbness Psych: +anxiety Heme/Lymph: No Bruising, No Bleeding   Past Medical History:  Diagnosis Date   Atrial fibrillation ([place]) 09/2009   Breast cancer ([place])    Diverticulosis 09/2006   GERD (gastroesophageal reflux disease)    Hiatal hernia    Hyperlipidemia    Hypertension    Osteopenia    Postmenopausal hormone replacement therapy 1989 - 07/2000    Past Surgical History:  Procedure Laterality Date   BREAST DUCTAL SYSTEM EXCISION Left 06/2002   duct ectasia with fibrosis - atypical lobular hyperplasia with micro calcifications - tamoxifen X 28 months then change to Evista 10/06   BREAST LUMPECTOMY WITH RADIOACTIVE SEED LOCALIZATION Right 01/27/2020   Procedure: RIGHT BREAST LUMPECTOMY WITH RADIOACTIVE SEED LOCALIZATION;  Surgeon: Coralie Keens, MD;  Location: [place];  Service: General;  Laterality: Right;   BREAST SURGERY Left 4/99   breast biopsy - fibrosis   CARDIOVERSION N/A  07/16/2017   Procedure: CARDIOVERSION;  Surgeon: Jerline Pain, MD;  Location: [facility] ENDOSCOPY;  Service: Cardiovascular;  Laterality: N/A;   CARDIOVERSION N/A 10/20/2018   Procedure: CARDIOVERSION;  Surgeon: Buford Dresser, MD;  Location: [place];  Service: Cardiovascular;  Laterality: N/A;   CATARACT EXTRACTION W/  INTRAOCULAR LENS IMPLANT Right    CESAREAN SECTION     X 2   HYSTEROSCOPY  4/95   w/D&C   TEE WITHOUT CARDIOVERSION N/A 10/20/2018   Procedure: TRANSESOPHAGEAL ECHOCARDIOGRAM (TEE);  Surgeon: Buford Dresser, MD;  Location: [facility] ENDOSCOPY;  Service: Cardiovascular;  Laterality: N/A;     reports that she quit smoking about 53 years ago. Her smoking use included cigarettes. She started smoking about 64 years ago. She has a 3.00 pack-year smoking history. She has never used smokeless tobacco. She reports current alcohol use. She reports that she does not use drugs. Social History  Allergies  Allergen Reactions   Penicillins Other (See Comments)    REACTION: "ITCHING IN EYES" Has patient had a PCN reaction causing immediate rash, facial/tongue/throat swelling, SOB or lightheadedness with hypotension: Unknown Has patient had a PCN reaction causing severe rash involving mucus membranes or skin necrosis: Unknown Has patient had a PCN reaction that required hospitalization: No Has patient had a PCN reaction occurring within the last 10 years: No If all of the above answers are "NO", then may proceed with Cephalosporin use.    Amlodipine Itching   Atorvastatin Other (See Comments)   Crab [Shellfish Allergy] Itching    Full body itching   Sulfa Antibiotics Other (See Comments)    REACTION: " NOT SURE"    Family History  Problem Relation Age of Onset   Hypertension Mother    Heart disease Father    COPD Father    Diabetes Maternal Grandfather    Diabetes Son    Rheum arthritis Brother    Kidney cancer Brother    Breast cancer Maternal Grandmother    Diabetes Maternal Aunt    Diabetes Maternal Aunt      Prior to Admission medications   Medication Sig Start Date End Date Taking? Authorizing Provider  acetaminophen (TYLENOL) 500 MG tablet Take 500-1,000 mg by mouth every 6 (six) hours as needed (pain.).    [provider]  aspirin 81 MG chewable tablet Chew 81 mg by  mouth daily.    [provider]  Biotin 5000 MCG TABS Take 5,000 mcg by mouth daily.    [provider]  Calcium-Magnesium-Vitamin D (CALCIUM 1200+D3 PO) Take 1 tablet by mouth in the morning and at bedtime.    [provider]  Camphor-Menthol-Methyl Sal (SALONPAS) 3.01-28-08 % PTCH Place 1 patch onto the skin daily as needed (knee/back pain.).    [provider]  ELIQUIS 5 MG TABS tablet TAKE 1 TABLET BY MOUTH TWICE A DAY 11/07/20   Camnitz, Ocie Doyne, MD  flecainide (TAMBOCOR) 150 MG tablet Take 1 tablet (150 mg total) by mouth 2 (two) times daily. 09/28/20   Camnitz, Will Hassell Done, MD  irbesartan (AVAPRO) 75 MG tablet Take 1 tablet (75 mg total) by mouth daily. Take 1 tablet (75 mg total) every evening. If systolic BP >465 can take an extra tablet (75 mg) in the morning. 01/06/21   Jettie Booze, MD  Omega-3 Fatty Acids (FISH OIL) 1200 MG CAPS Take 1,200 mg by mouth in the morning and at bedtime.    [provider]  ondansetron (ZOFRAN-ODT) 4 MG disintegrating tablet Take 4  mg by mouth every 8 (eight) hours as needed for nausea or vomiting.    [provider]  Polyethyl Glycol-Propyl Glycol (LUBRICANT EYE DROPS) 0.4-0.3 % SOLN Place 1 drop into both eyes 3 (three) times daily as needed (dry/irritated eyes).    [provider]  PROLIA 60 MG/ML SOSY injection INJECT 60MG  SUBCUTANEOUSLY  INTO UPPER ARM, THIGH, OR  ABDOMEN EVERY 6 MONTHS  (GIVEN AT PRESCRIBERS  OFFICE) Patient taking differently: Inject 60 mg into the skin every 6 (six) months. 11/18/17   Salvadore Dom, MD  simvastatin (ZOCOR) 20 MG tablet Take 20 mg by mouth every evening.  02/23/13   [provider]    Physical Exam: Vitals:   02/03/21 1945 02/03/21 2015 02/03/21 2100 02/03/21 2213  BP: (!) 119/48 (!) 122/45 (!) 116/42 (!) 137/53  Pulse: 70 72 73 73  Resp: 12 19 20 20   Temp:    97.8 F (36.6 C)  TempSrc:    Oral  SpO2: 95% 95% 95% 96%  Weight:     60.6 kg  Height:    5\' 4"  (1.626 m)    Constitutional: NAD, calm, comfortable, well-appearing female appearing much younger than stated age laying at approximately 20 degree incline in bed talking on the phone Vitals:   02/03/21 1945 02/03/21 2015 02/03/21 2100 02/03/21 2213  BP: (!) 119/48 (!) 122/45 (!) 116/42 (!) 137/53  Pulse: 70 72 73 73  Resp: 12 19 20 20   Temp:    97.8 F (36.6 C)  TempSrc:    Oral  SpO2: 95% 95% 95% 96%  Weight:    60.6 kg  Height:    5\' 4"  (1.626 m)   Eyes: lids and conjunctivae normal ENMT: Mucous membranes are moist.  Neck: normal, supple Respiratory: clear to auscultation bilaterally, no wheezing, no crackles. Normal respiratory effort. Cardiovascular: Regular rate and rhythm, no murmurs / rubs / gallops. No extremity edema.  Abdomen: no tenderness, no masses palpated. Bowel sounds positive.  Musculoskeletal: no clubbing / cyanosis. No joint deformity upper and lower extremities. Good ROM, no contractures. Normal muscle tone.  Skin: no rashes, lesions, ulcers. No induration Neurologic: CN 2-12 grossly intact. Sensation intact, Strength 5/5 in all 4.  Psychiatric: Normal judgment and insight. Alert and oriented x 3. Normal mood.     Labs on Admission: I have personally reviewed following labs and imaging studies  CBC: Recent Labs  Lab 02/03/21 1045  WBC 5.8  NEUTROABS 4.2  HGB 13.8  HCT 43.4  MCV 95.4  PLT 474   Basic Metabolic Panel: Recent Labs  Lab 02/03/21 1045  NA 142  K 3.9  CL 103  CO2 30  GLUCOSE 99  BUN 24*  CREATININE 0.97  CALCIUM 10.0   GFR: Estimated Creatinine Clearance: 38.6 mL/min (by C-G formula based on SCr of 0.97 mg/dL). Liver Function Tests: Recent Labs  Lab 02/03/21 1045  AST 18  ALT 15  ALKPHOS 62  BILITOT 0.9  PROT 7.3  ALBUMIN 4.5   No results for input(s): LIPASE, AMYLASE in the last 168 hours. No results for input(s): AMMONIA in the last 168 hours. Coagulation Profile: Recent Labs  Lab  02/03/21 1045  INR 1.3*   Cardiac Enzymes: No results for input(s): CKTOTAL, CKMB, CKMBINDEX, TROPONINI in the last 168 hours. BNP (last 3 results) Recent Labs    06/07/20 1549  PROBNP 309   HbA1C: No results for input(s): HGBA1C in the last 72 hours. CBG: No results for input(s): GLUCAP in  the last 168 hours. Lipid Profile: No results for input(s): CHOL, HDL, LDLCALC, TRIG, CHOLHDL, LDLDIRECT in the last 72 hours. Thyroid Function Tests: No results for input(s): TSH, T4TOTAL, FREET4, T3FREE, THYROIDAB in the last 72 hours. Anemia Panel: No results for input(s): VITAMINB12, FOLATE, FERRITIN, TIBC, IRON, RETICCTPCT in the last 72 hours. Urine analysis:    Component Value Date/Time   COLORURINE YELLOW 10/27/2007 1414   APPEARANCEUR CLEAR 10/27/2007 1414   LABSPEC 1.013 10/27/2007 1414   PHURINE 7.5 10/27/2007 1414   GLUCOSEU NEGATIVE 10/27/2007 1414   HGBUR NEGATIVE 10/27/2007 1414   BILIRUBINUR NEGATIVE 10/27/2007 1414   KETONESUR NEGATIVE 10/27/2007 1414   PROTEINUR NEGATIVE 10/27/2007 1414   UROBILINOGEN 0.2 10/27/2007 1414   NITRITE NEGATIVE 10/27/2007 1414   LEUKOCYTESUR TRACE (A) 10/27/2007 1414    Radiological Exams on Admission: CT HEAD WO CONTRAST  Result Date: 02/03/2021 CLINICAL DATA:  Dizziness EXAM: CT HEAD WITHOUT CONTRAST TECHNIQUE: Contiguous axial images were obtained from the base of the skull through the vertex without intravenous contrast. RADIATION DOSE REDUCTION: This exam was performed according to the departmental dose-optimization program which includes automated exposure control, adjustment of the mA and/or kV according to patient size and/or use of iterative reconstruction technique. COMPARISON:  CT head 05/30/2016 FINDINGS: Brain: There is no evidence of acute intracranial hemorrhage, extra-axial fluid collection, or acute infarct. There is mild global parenchymal volume loss with prominence of the ventricular system and extra-axial CSF spaces.  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 There is no solid mass lesion.  There is no midline shift. Vascular: There is calcification of the bilateral cavernous ICAs. Skull: Normal. Negative for fracture or focal lesion. Sinuses/Orbits: The imaged paranasal sinuses are clear. A right lens implant is noted. The globes and orbits are otherwise unremarkable. Other: None. IMPRESSION: No acute intracranial pathology. Electronically Signed   By: Valetta Mole M.D.   On: 02/03/2021 11:47   CT ANGIO HEAD NECK W WO CM (CODE STROKE)  Result Date: 02/03/2021 CLINICAL DATA:  Provided history: Headache, sudden, severe. EXAM: CT ANGIOGRAPHY HEAD AND NECK TECHNIQUE: Multidetector CT imaging of the head and neck was performed using the standard protocol during bolus administration of intravenous contrast. Multiplanar CT image reconstructions and MIPs were obtained to evaluate the vascular anatomy. Carotid stenosis measurements (when applicable) are obtained utilizing NASCET criteria, using the distal internal carotid diameter as the denominator. RADIATION DOSE REDUCTION: This exam was performed according to the departmental dose-optimization program which includes automated exposure control, adjustment of the mA and/or kV according to patient size and/or use of iterative reconstruction technique. CONTRAST:  145mL OMNIPAQUE IOHEXOL 350 MG/ML SOLN COMPARISON:  Noncontrast head CT performed earlier today 02/03/2021. CT cervical spine 05/30/2016. FINDINGS: CTA NECK FINDINGS Aortic arch: Standard aortic branching. Atherosclerotic plaque within the visualized aortic arch and proximal major branch vessels of the neck. No hemodynamically significant innominate or proximal subclavian artery stenosis. Right carotid system: CCA and ICA patent within the neck without significant stenosis (50% or greater). Mild atherosclerotic plaque within the  distal CCA, about the carotid bifurcation and within the proximal ICA. Left carotid system: CCA and ICA patent within the neck without significant stenosis (50% or greater). Mild atherosclerotic plaque within the distal CCA, about the carotid bifurcation and within the proximal ICA. Vertebral arteries: Vertebral arteries codominant and patent within the neck. Moderate stenosis at the origin of the left vertebral artery. Skeleton: Cervical spondylosis. Presumed chronic (given history)  mild T4 vertebral superior endplate deformity. Other neck: 2.4 cm left thyroid lobe nodule. Upper chest: No consolidation within the imaged lung apices. Review of the MIP images confirms the above findings CTA HEAD FINDINGS Anterior circulation: The intracranial internal carotid arteries are patent. Calcified plaque within both vessels with no more than mild stenosis. The M1 middle cerebral arteries are patent. No M2 proximal branch occlusion or high-grade proximal stenosis is identified. The anterior cerebral arteries are patent. No intracranial aneurysm is identified. Posterior circulation: The intracranial vertebral arteries are patent. The basilar artery is patent. The posterior cerebral arteries are patent. A right posterior communicating artery is present. The left posterior communicating artery is diminutive or absent. Venous sinuses: Within the limitations of contrast timing, no convincing thrombus. Anatomic variants: As described. Review of the MIP images confirms the above findings IMPRESSION: CTA neck: 1. The common carotid and internal carotid arteries are patent within the neck without hemodynamically significant stenosis. Mild atherosclerotic plaque within both carotid systems within the neck, as described. 2. Vertebral arteries codominant and patent within the neck. Moderate stenosis at the origin of the left vertebral artery. 3. Aortic Atherosclerosis (ICD10-I70.0). 4. 2.4 cm left thyroid lobe nodule. A non-emergent  thyroid ultrasound is recommended for further evaluation. CTA head: 1. No intracranial large vessel occlusion or proximal high-grade arterial stenosis. 2. Calcified plaque within the intracranial internal carotid arteries with no more than mild stenosis. Electronically Signed   By: Kellie Simmering D.O.   On: 02/03/2021 14:48      Assessment/Plan  Hypertensive urgency  Labile blood pressure -pt previously on amlodipin,quinapril, Chlorthiadone but discontinue due to various reasons including Schamberg's disease, dizziness, and labile BP - Received hydralazine IV in ED and became hypotensive down to 80s -Now normotensive. Will add on PRN IV Labetalol 5mg  G8QP for BP systolic greater than 619 and/or diastolic greater than 509.  -Continue Irbesartan tomorrow -Also noted to have systolic murmur on exam-obtain echocardiogram  Headache Suspect secondary to hypertensive urgency.  Had extensive work-up with negative CT head, CTA head and neck. -Symptoms have improved with blood pressure control  Paroxysmal atrial fibrillation Continue Eliquis  -pt had flecainide and Toprol XL in previous cardiology note but unable to verify whether she takes them. Will need to pharmacy verification and resume tomorrow  DVT prophylaxis:Eliquis Code Status: Full Family Communication: Plan discussed with patient at bedside  disposition Plan: Home with observation Consults called:  Admission status: Observation  Level of care: Telemetry Cardiac  Status is: Observation  The patient remains OBS appropriate and will d/c before 2 midnights.        Orene Desanctis DO [facility]   If 7PM-7AM, please contact night-coverage [email or web address]   02/03/2021, 11:12 PM

## 2021-02-03 NOTE — ED Notes (Signed)
Patient developed chest pain which she describes as stabbing mid chest along with her headache. Patient appears anxious.  Dr Almyra Free aware.  Orders received. EKG done and repeat trophin sent to lab.  Will continue to monitor patient.

## 2021-02-03 NOTE — ED Notes (Signed)
Called [facility] to transport patient to [place] room 30

## 2021-02-04 ENCOUNTER — Observation Stay (HOSPITAL_BASED_OUTPATIENT_CLINIC_OR_DEPARTMENT_OTHER): Payer: Medicare PPO

## 2021-02-04 DIAGNOSIS — R519 Headache, unspecified: Secondary | ICD-10-CM

## 2021-02-04 DIAGNOSIS — R011 Cardiac murmur, unspecified: Secondary | ICD-10-CM

## 2021-02-04 DIAGNOSIS — I160 Hypertensive urgency: Secondary | ICD-10-CM | POA: Diagnosis not present

## 2021-02-04 DIAGNOSIS — R0989 Other specified symptoms and signs involving the circulatory and respiratory systems: Secondary | ICD-10-CM | POA: Diagnosis not present

## 2021-02-04 DIAGNOSIS — I480 Paroxysmal atrial fibrillation: Secondary | ICD-10-CM | POA: Diagnosis not present

## 2021-02-04 LAB — CREATININE, SERUM
Creatinine, Ser: 1.36 mg/dL — ABNORMAL HIGH (ref 0.44–1.00)
GFR, Estimated: 39 mL/min — ABNORMAL LOW (ref >60)

## 2021-02-04 LAB — ECHOCARDIOGRAM COMPLETE
Area-P 1/2: 2.96 cm2
Height: 64 in
S' Lateral: 2.6 cm
Weight: 2136 oz

## 2021-02-04 LAB — BASIC METABOLIC PANEL
Anion gap: 11 (ref 5–15)
BUN: 28 mg/dL — ABNORMAL HIGH (ref 8–23)
CO2: 26 mmol/L (ref 22–32)
Calcium: 9.6 mg/dL (ref 8.9–10.3)
Chloride: 106 mmol/L (ref 98–111)
Creatinine, Ser: 1.22 mg/dL — ABNORMAL HIGH (ref 0.44–1.00)
GFR, Estimated: 44 mL/min — ABNORMAL LOW (ref >60)
Glucose, Bld: 98 mg/dL (ref 70–99)
Potassium: 4 mmol/L (ref 3.5–5.1)
Sodium: 143 mmol/L (ref 135–145)

## 2021-02-04 MED ORDER — FLECAINIDE ACETATE 50 MG PO TABS: 150.0000 mg | ORAL_TABLET | Freq: Two times a day (BID) | ORAL | Status: DC

## 2021-02-04 MED ORDER — SODIUM CHLORIDE 0.9 % IV SOLN: INTRAVENOUS | Status: DC

## 2021-02-04 MED ORDER — IRBESARTAN 75 MG PO TABS
75.0000 mg | ORAL_TABLET | Freq: Every day | ORAL | Status: DC
  Administered 2021-02-04 – 2021-02-05 (×2): 75 mg via ORAL
  Filled 2021-02-04 (×3): qty 1

## 2021-02-04 MED ORDER — FLECAINIDE ACETATE 50 MG PO TABS
150.0000 mg | ORAL_TABLET | Freq: Two times a day (BID) | ORAL | Status: DC
  Administered 2021-02-04 – 2021-02-05 (×3): 150 mg via ORAL
  Filled 2021-02-04 (×3): qty 3

## 2021-02-04 MED ORDER — ASPIRIN 81 MG PO CHEW
162.0000 mg | CHEWABLE_TABLET | Freq: Every day | ORAL | Status: DC
  Administered 2021-02-04 – 2021-02-05 (×2): 162 mg via ORAL
  Filled 2021-02-04 (×2): qty 2

## 2021-02-04 NOTE — Progress Notes (Signed)
Echocardiogram 2D Echocardiogram has been performed.  [place] Deputy Stephinie Battisti RDCS 02/04/2021, 11:49 AM

## 2021-02-04 NOTE — Care Management Obs Status (Signed)
[place] NOTIFICATION   Patient Details  Name: Anna Mathews MRN: [id number] Date of Birth: 1940-06-29   Medicare Observation Status Notification Given:  Yes    Bethena Roys, RN 02/04/2021, 3:02 PM

## 2021-02-04 NOTE — Progress Notes (Signed)
PROGRESS NOTE    Anna Mathews  JQB:[id number] DOB: 29-Nov-1940 DOA: 02/03/2021 PCP: Vernie Shanks, MD   Chief Complaint  Patient presents with   Dizziness    Brief Narrative:  Anna Mathews is a 81 y.o. female with medical history significant for hypertension, paroxysmal atrial fibrillation on Eliquis, GERD and hyperlipidemia who presents as a transfer from outside ED for concerns of hypertensive urgency.  She has had multiple antihypertensive changes in the past 6 months by cardiology due to labile blood pressure.  She was previously on amlodipine and quinapril for about 15 years.  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  Amlodipine cannot be ruled out as the cause.  Her amlodipine was then switched to chlorthalidone but later discontinued since it was causing labile blood pressure and dizziness. Her quinapril was also later discontinued due to hypotension despite attempts to decrease dosage.  She was started on irbesartan at 75 mg in November and was advised to take extra dose if her blood pressure goes above 150.  she was hypertensive up to 220s over 80s and had became hypotensive down to 80 over 30s and bradycardic following a repeat dose of IV 5 mg of hydralazine.   Assessment & Plan:   Active Problems:   Paroxysmal atrial fibrillation (HCC)   Hypertensive urgency   Labile blood pressure  Hypertensive Urgency:  Resolved.  Pt 's headache has resolved.  Echo is unremarkable.  Unclear what brought the episode.  Plan to resume Irbesartan at the current dose of 75 mg and monitor her overnight and watch her episodes of hypertension urgency.  Discussed the plan with the patient and daughter.     PAF;  Rate controlled and is on eliquis for anticoagulation.  Resume flecainide as she and daughter reports taking it.     Headache  Resolved.  CTA unremarkable.    Mild AKI;  Suspect from dehydration.   Gently hydrate and repeat renal parameters in am.      DVT prophylaxis: eliquis.  Code Status: full code.  Family Communication: daughter  Disposition:   Status is: Observation  The patient will require care spanning > 2 midnights and should be moved to inpatient because: AKI      Consultants:  None.   Procedures: none.   Antimicrobials: none.    Subjective: Pt denies headache, nausea, dizziness, chest pain or sob.   Objective: Vitals:   02/04/21 0158 02/04/21 0542 02/04/21 0936 02/04/21 1215  BP: (!) 119/41 (!) 113/45 (!) 120/52 (!) 140/52  Pulse: 63 64  67  Resp: 20 19 18 19   Temp: 97.9 F (36.6 C) 97.7 F (36.5 C)  97.7 F (36.5 C)  TempSrc: Oral Oral  Oral  SpO2: 96% 95%    Weight:      Height:        Intake/Output Summary (Last 24 hours) at 02/04/2021 1804 Last data filed at 02/03/2021 1950 Gross per 24 hour  Intake 500.46 ml  Output --  Net 500.46 ml   Filed Weights   02/03/21 1018 02/03/21 2213  Weight: 62.1 kg 60.6 kg    Examination:  General exam: Appears calm and comfortable  Respiratory system: Clear to auscultation. Respiratory effort normal. Cardiovascular system: S1 & S2 heard, RRR. No JVD, murmurs, rubs, gallops or clicks. No pedal edema. Gastrointestinal system: Abdomen is nondistended, soft and nontender. Normal bowel sounds heard. Central nervous system: Alert and oriented. No focal  neurological deficits. Extremities: Symmetric 5 x 5 power. Skin: No rashes, lesions or ulcers Psychiatry: Mood & affect appropriate.     Data Reviewed: I have personally reviewed following labs and imaging studies  CBC: Recent Labs  Lab 02/03/21 1045  WBC 5.8  NEUTROABS 4.2  HGB 13.8  HCT 43.4  MCV 95.4  PLT 099    Basic Metabolic Panel: Recent Labs  Lab 02/03/21 1045 02/04/21 0229 02/04/21 1408  NA 142 143  --   K 3.9 4.0  --   CL 103 106  --   CO2 30 26  --   GLUCOSE 99 98  --   BUN 24* 28*  --   CREATININE 0.97 1.22* 1.36*   CALCIUM 10.0 9.6  --     GFR: Estimated Creatinine Clearance: 27.5 mL/min (A) (by C-G formula based on SCr of 1.36 mg/dL (H)).  Liver Function Tests: Recent Labs  Lab 02/03/21 1045  AST 18  ALT 15  ALKPHOS 62  BILITOT 0.9  PROT 7.3  ALBUMIN 4.5    CBG: No results for input(s): GLUCAP in the last 168 hours.   Recent Results (from the past 240 hour(s))  Resp Panel by RT-PCR (Flu A&B, Covid) Nasopharyngeal Swab     Status: None   Collection Time: 02/03/21  8:40 PM   Specimen: Nasopharyngeal Swab; Nasopharyngeal(NP) swabs in vial transport medium  Result Value Ref Range Status   SARS Coronavirus 2 by RT PCR NEGATIVE NEGATIVE Final    Comment: (NOTE) SARS-CoV-2 target nucleic acids are NOT DETECTED.  The SARS-CoV-2 RNA is generally detectable in upper respiratory specimens during the acute phase of infection. The lowest concentration of SARS-CoV-2 viral copies this assay can detect is 138 copies/mL. A negative result does not preclude SARS-Cov-2 infection and should not be used as the sole basis for treatment or other patient management decisions. A negative result may occur with  improper specimen collection/handling, submission of specimen other than nasopharyngeal swab, presence of viral mutation(s) within the areas targeted by this assay, and inadequate number of viral copies(<138 copies/mL). A negative result must be combined with clinical observations, patient history, and epidemiological information. The expected result is Negative.  Fact Sheet for Patients:  EntrepreneurPulse.com.au  Fact Sheet for Healthcare Providers:  IncredibleEmployment.be  This test is no t yet approved or cleared by the [place] FDA and  has been authorized for detection and/or diagnosis of SARS-CoV-2 by FDA under an Emergency Use Authorization (EUA). This EUA will remain  in effect (meaning this test can be used) for the duration of  the COVID-19 declaration under Section 564(b)(1) of the Act, 21 U.S.C.section 360bbb-3(b)(1), unless the authorization is terminated  or revoked sooner.       Influenza A by PCR NEGATIVE NEGATIVE Final   Influenza B by PCR NEGATIVE NEGATIVE Final    Comment: (NOTE) The Xpert Xpress SARS-CoV-2/FLU/RSV plus assay is intended as an aid in the diagnosis of influenza from Nasopharyngeal swab specimens and should not be used as a sole basis for treatment. Nasal washings and aspirates are unacceptable for Xpert Xpress SARS-CoV-2/FLU/RSV testing.  Fact Sheet for Patients: EntrepreneurPulse.com.au  Fact Sheet for Healthcare Providers: IncredibleEmployment.be  This test is not yet approved or cleared by the [place] FDA and has been authorized for detection and/or diagnosis of SARS-CoV-2 by FDA under an Emergency Use Authorization (EUA). This EUA will remain in effect (meaning this test can be used) for the duration of the COVID-19 declaration under Section 564(b)(1) of  the Act, 21 U.S.C. section 360bbb-3(b)(1), unless the authorization is terminated or revoked.  Performed at [facility], [address] [place], [place] [address]          Radiology Studies: CT HEAD WO CONTRAST  Result Date: 02/03/2021 CLINICAL DATA:  Dizziness EXAM: CT HEAD WITHOUT CONTRAST TECHNIQUE: Contiguous axial images were obtained from the base of the skull through the vertex without intravenous contrast. RADIATION DOSE REDUCTION: This exam was performed according to the departmental dose-optimization program which includes automated exposure control, adjustment of the mA and/or kV according to patient size and/or use of iterative reconstruction technique. COMPARISON:  CT head 05/30/2016 FINDINGS: Brain: There is no evidence of acute intracranial hemorrhage, extra-axial fluid collection, or acute infarct. There is mild global parenchymal volume loss  with prominence of the ventricular system and extra-axial CSF spaces. 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 There is no solid mass lesion.  There is no midline shift. Vascular: There is calcification of the bilateral cavernous ICAs. Skull: Normal. Negative for fracture or focal lesion. Sinuses/Orbits: The imaged paranasal sinuses are clear. A right lens implant is noted. The globes and orbits are otherwise unremarkable. Other: None. IMPRESSION: No acute intracranial pathology. Electronically Signed   By: Valetta Mole M.D.   On: 02/03/2021 11:47   ECHOCARDIOGRAM COMPLETE  Result Date: 02/04/2021    ECHOCARDIOGRAM REPORT   Patient Name:   Teagyn G Gawlik Date of Exam: 02/04/2021 Medical Rec #:  [id number]     Height:       64.0 in Accession #:    [id number]    Weight:       133.5 lb Date of Birth:  [birth year]    BSA:          1.648 m Patient Age:    89 years      BP:           113/45 mmHg Patient Gender: F             HR:           68 bpm. Exam Location:  Inpatient Procedure: 2D Echo, Color Doppler and Cardiac Doppler Indications:    R01.1 Murmur  History:        Patient has prior history of Echocardiogram examinations, most                 recent 07/08/2020. Arrythmias:Atrial Fibrillation; Risk                 Factors:Hypertension and Dyslipidemia.  Sonographer:    Raquel Sarna Senior RDCS Referring Phys: [id number] [place]  1. Left ventricular ejection fraction, by estimation, is 60 to 65%. The left ventricle has normal function. The left ventricle has no regional wall motion abnormalities. There is mild asymmetric left ventricular hypertrophy of the basal-septal segment. Left ventricular diastolic parameters are indeterminate.  2. Right ventricular systolic function is normal. The right ventricular size is normal. There is normal pulmonary artery systolic pressure. The estimated right ventricular systolic pressure  is 09.3 mmHg.  3. Left atrial size was moderately dilated.  4. The mitral valve is degenerative. Trivial mitral valve regurgitation. Moderate to severe mitral annular calcification.  5. The aortic valve is tricuspid. There is mild calcification of the aortic valve. There is mild thickening of the aortic valve. Aortic valve regurgitation is not visualized. Aortic valve sclerosis/calcification is present, without any evidence of aortic stenosis.  6. The  inferior vena cava is normal in size with greater than 50% respiratory variability, suggesting right atrial pressure of 3 mmHg. Comparison(s): No significant change from prior study. FINDINGS  Left Ventricle: Left ventricular ejection fraction, by estimation, is 60 to 65%. The left ventricle has normal function. The left ventricle has no regional wall motion abnormalities. The left ventricular internal cavity size was normal in size. There is  mild asymmetric left ventricular hypertrophy of the basal-septal segment. Left ventricular diastolic parameters are indeterminate. Right Ventricle: The right ventricular size is normal. No increase in right ventricular wall thickness. Right ventricular systolic function is normal. There is normal pulmonary artery systolic pressure. The tricuspid regurgitant velocity is 2.42 m/s, and  with an assumed right atrial pressure of 3 mmHg, the estimated right ventricular systolic pressure is 32.9 mmHg. Left Atrium: Left atrial size was moderately dilated. Right Atrium: Right atrial size was normal in size. Pericardium: There is no evidence of pericardial effusion. Mitral Valve: The mitral valve is degenerative in appearance. There is mild thickening of the mitral valve leaflet(s). There is mild calcification of the mitral valve leaflet(s). Moderate to severe mitral annular calcification. Trivial mitral valve regurgitation. Tricuspid Valve: The tricuspid valve is normal in structure. Tricuspid valve regurgitation is trivial. Aortic Valve:  The aortic valve is tricuspid. There is mild calcification of the aortic valve. There is mild thickening of the aortic valve. Aortic valve regurgitation is not visualized. Aortic valve sclerosis/calcification is present, without any evidence of aortic stenosis. Pulmonic Valve: The pulmonic valve was normal in structure. Pulmonic valve regurgitation is trivial. Aorta: The aortic root and ascending aorta are structurally normal, with no evidence of dilitation. Venous: The inferior vena cava is normal in size with greater than 50% respiratory variability, suggesting right atrial pressure of 3 mmHg. IAS/Shunts: The atrial septum is grossly normal.  LEFT VENTRICLE PLAX 2D LVIDd:         4.30 cm   Diastology LVIDs:         2.60 cm   LV e' medial:    6.65 cm/s LV PW:         0.80 cm   LV E/e' medial:  19.1 LV IVS:        1.00 cm   LV e' lateral:   12.60 cm/s LVOT diam:     2.00 cm   LV E/e' lateral: 10.1 LV SV:         83 LV SV Index:   50 LVOT Area:     3.14 cm  RIGHT VENTRICLE RV S prime:     13.50 cm/s TAPSE (M-mode): 2.5 cm LEFT ATRIUM             Index        RIGHT ATRIUM           Index LA diam:        4.50 cm 2.73 cm/m   RA Area:     14.60 cm LA Vol (A2C):   64.6 ml 39.21 ml/m  RA Volume:   32.30 ml  19.60 ml/m LA Vol (A4C):   61.6 ml 37.39 ml/m LA Biplane Vol: 64.0 ml 38.85 ml/m  AORTIC VALVE LVOT Vmax:   120.00 cm/s LVOT Vmean:  87.200 cm/s LVOT VTI:    0.263 m  AORTA Ao Root diam: 3.20 cm Ao Asc diam:  3.40 cm MITRAL VALVE                TRICUSPID VALVE MV Area (PHT): 2.96 cm  TR Peak grad:   23.4 mmHg MV Decel Time: 256 msec     TR Vmax:        242.00 cm/s MV E velocity: 127.00 cm/s MV A velocity: 58.30 cm/s   SHUNTS MV E/A ratio:  2.18         Systemic VTI:  0.26 m                             Systemic Diam: 2.00 cm Gwyndolyn Kaufman MD Electronically signed by Gwyndolyn Kaufman MD Signature Date/Time: 02/04/2021/1:14:33 PM    Final    CT ANGIO HEAD NECK W WO CM (CODE STROKE)  Result Date:  02/03/2021 CLINICAL DATA:  Provided history: Headache, sudden, severe. EXAM: CT ANGIOGRAPHY HEAD AND NECK TECHNIQUE: Multidetector CT imaging of the head and neck was performed using the standard protocol during bolus administration of intravenous contrast. Multiplanar CT image reconstructions and MIPs were obtained to evaluate the vascular anatomy. Carotid stenosis measurements (when applicable) are obtained utilizing NASCET criteria, using the distal internal carotid diameter as the denominator. RADIATION DOSE REDUCTION: This exam was performed according to the departmental dose-optimization program which includes automated exposure control, adjustment of the mA and/or kV according to patient size and/or use of iterative reconstruction technique. CONTRAST:  127mL OMNIPAQUE IOHEXOL 350 MG/ML SOLN COMPARISON:  Noncontrast head CT performed earlier today 02/03/2021. CT cervical spine 05/30/2016. FINDINGS: CTA NECK FINDINGS Aortic arch: Standard aortic branching. Atherosclerotic plaque within the visualized aortic arch and proximal major branch vessels of the neck. No hemodynamically significant innominate or proximal subclavian artery stenosis. Right carotid system: CCA and ICA patent within the neck without significant stenosis (50% or greater). Mild atherosclerotic plaque within the distal CCA, about the carotid bifurcation and within the proximal ICA. Left carotid system: CCA and ICA patent within the neck without significant stenosis (50% or greater). Mild atherosclerotic plaque within the distal CCA, about the carotid bifurcation and within the proximal ICA. Vertebral arteries: Vertebral arteries codominant and patent within the neck. Moderate stenosis at the origin of the left vertebral artery. Skeleton: Cervical spondylosis. Presumed chronic (given history) mild T4 vertebral superior endplate deformity. Other neck: 2.4 cm left thyroid lobe nodule. Upper chest: No consolidation within the imaged lung apices.  Review of the MIP images confirms the above findings CTA HEAD FINDINGS Anterior circulation: The intracranial internal carotid arteries are patent. Calcified plaque within both vessels with no more than mild stenosis. The M1 middle cerebral arteries are patent. No M2 proximal branch occlusion or high-grade proximal stenosis is identified. The anterior cerebral arteries are patent. No intracranial aneurysm is identified. Posterior circulation: The intracranial vertebral arteries are patent. The basilar artery is patent. The posterior cerebral arteries are patent. A right posterior communicating artery is present. The left posterior communicating artery is diminutive or absent. Venous sinuses: Within the limitations of contrast timing, no convincing thrombus. Anatomic variants: As described. Review of the MIP images confirms the above findings IMPRESSION: CTA neck: 1. The common carotid and internal carotid arteries are patent within the neck without hemodynamically significant stenosis. Mild atherosclerotic plaque within both carotid systems within the neck, as described. 2. Vertebral arteries codominant and patent within the neck. Moderate stenosis at the origin of the left vertebral artery. 3. Aortic Atherosclerosis (ICD10-I70.0). 4. 2.4 cm left thyroid lobe nodule. A non-emergent thyroid ultrasound is recommended for further evaluation. CTA head: 1. No intracranial large vessel occlusion or proximal high-grade arterial stenosis. 2. Calcified  plaque within the intracranial internal carotid arteries with no more than mild stenosis. Electronically Signed   By: Kellie Simmering D.O.   On: 02/03/2021 14:48        Scheduled Meds:  apixaban  5 mg Oral BID   aspirin  162 mg Oral Daily   flecainide  150 mg Oral Q12H   irbesartan  75 mg Oral Daily   Continuous Infusions:   LOS: 0 days        Hosie Poisson, MD [facility]   To contact the attending provider between 7A-7P or the covering provider  during after hours 7P-7A, please log into the web site [email or web address] and access using universal [place] password for that web site. If you do not have the password, please call the hospital operator.  02/04/2021, 6:04 PM

## 2021-02-05 DIAGNOSIS — Z7189 Other specified counseling: Secondary | ICD-10-CM

## 2021-02-05 DIAGNOSIS — R519 Headache, unspecified: Secondary | ICD-10-CM | POA: Diagnosis not present

## 2021-02-05 DIAGNOSIS — R0989 Other specified symptoms and signs involving the circulatory and respiratory systems: Secondary | ICD-10-CM | POA: Diagnosis not present

## 2021-02-05 DIAGNOSIS — Z515 Encounter for palliative care: Secondary | ICD-10-CM

## 2021-02-05 DIAGNOSIS — I160 Hypertensive urgency: Secondary | ICD-10-CM | POA: Diagnosis not present

## 2021-02-05 DIAGNOSIS — I480 Paroxysmal atrial fibrillation: Secondary | ICD-10-CM | POA: Diagnosis not present

## 2021-02-05 LAB — URINALYSIS, ROUTINE W REFLEX MICROSCOPIC
Bilirubin Urine: NEGATIVE
Glucose, UA: 100 mg/dL — AB
Hgb urine dipstick: NEGATIVE
Ketones, ur: NEGATIVE mg/dL
Leukocytes,Ua: NEGATIVE
Nitrite: NEGATIVE
Protein, ur: NEGATIVE mg/dL
Specific Gravity, Urine: 1.025 (ref 1.005–1.030)
pH: 5 (ref 5.0–8.0)

## 2021-02-05 LAB — BASIC METABOLIC PANEL
Anion gap: 8 (ref 5–15)
BUN: 27 mg/dL — ABNORMAL HIGH (ref 8–23)
CO2: 24 mmol/L (ref 22–32)
Calcium: 8.9 mg/dL (ref 8.9–10.3)
Chloride: 108 mmol/L (ref 98–111)
Creatinine, Ser: 1.16 mg/dL — ABNORMAL HIGH (ref 0.44–1.00)
GFR, Estimated: 47 mL/min — ABNORMAL LOW (ref >60)
Glucose, Bld: 105 mg/dL — ABNORMAL HIGH (ref 70–99)
Potassium: 4.7 mmol/L (ref 3.5–5.1)
Sodium: 140 mmol/L (ref 135–145)

## 2021-02-05 NOTE — Evaluation (Signed)
Physical Therapy Evaluation Patient Details Name: Anna Mathews MRN: [id number] DOB: 1940/03/21 Today's Date: 02/05/2021  History of Present Illness  Pt is an 81 y/o female admitted secondary to a headache and found to have hypertensive urgency. PMH including but not limited to  hypertension, paroxysmal atrial fibrillation on Eliquis, GERD and hyperlipidemia.  Clinical Impression  Pt presented supine in bed with HOB elevated, awake and willing to participate in therapy session. Prior to admission, pt reported that she was independent with all functional mobility and ADLs. Pt lives with her dog ([name]) in a single level condo with a level entry. She stated her daughter lives very close by and she has very supportive neighbors. At the time of evaluation, pt was independent with bed mobility and transfers. She ambulated in the hallway without an AD with supervision for safety without difficulties. She appears to be at her baseline in regards to functional mobility. Pt stated that she is eager to return home and would like to start going to the gym again. No further acute PT needs identified at this time. PT signing off.     Recommendations for follow up therapy are one component of a multi-disciplinary discharge planning process, led by the attending physician.  Recommendations may be updated based on patient status, additional functional criteria and insurance authorization.  Follow Up Recommendations No PT follow up    Assistance Recommended at Discharge None  Patient can return home with the following       Equipment Recommendations None recommended by PT  Recommendations for Other Services       Functional Status Assessment Patient has not had a recent decline in their functional status     Precautions / Restrictions Precautions Precautions: Fall Restrictions Weight Bearing Restrictions: No      Mobility  Bed Mobility Overal bed mobility: Independent                   Transfers Overall transfer level: Independent                      Ambulation/Gait Ambulation/Gait assistance: Supervision Gait Distance (Feet): 200 Feet Assistive device: None Gait Pattern/deviations: Step-through pattern Gait velocity: able to fluctuate     General Gait Details: no instability or LOB, no external support or physical assistance needed  Stairs            Wheelchair Mobility    Modified Rankin (Stroke Patients Only)       Balance Overall balance assessment: No apparent balance deficits (not formally assessed) Sitting-balance support: No upper extremity supported Sitting balance-Leahy Scale: Good     Standing balance support: No upper extremity supported Standing balance-Leahy Scale: Good                               Pertinent Vitals/Pain Pain Assessment: No/denies pain    Home Living Family/patient expects to be discharged to:: Private residence Living Arrangements: Alone Available Help at Discharge: Family;Friend(s);Neighbor;Available PRN/intermittently Type of Home: Other(Comment) (condo) Home Access: Level entry       Home Layout: One level Home Equipment: Shower seat      Prior Function Prior Level of Function : Independent/Modified Independent             Mobility Comments: drives, enjoys being active, has a Set designer Dominance        Extremity/Trunk Assessment   Upper  Extremity Assessment Upper Extremity Assessment: Overall WFL for tasks assessed    Lower Extremity Assessment Lower Extremity Assessment: Overall WFL for tasks assessed       Communication   Communication: No difficulties  Cognition Arousal/Alertness: Awake/alert Behavior During Therapy: WFL for tasks assessed/performed Overall Cognitive Status: Within Functional Limits for tasks assessed                                          General Comments      Exercises     Assessment/Plan    PT  Assessment Patient does not need any further PT services  PT Problem List         PT Treatment Interventions      PT Goals (Current goals can be found in the Care Plan section)  Acute Rehab PT Goals Patient Stated Goal: "home today" PT Goal Formulation: All assessment and education complete, DC therapy    Frequency       Co-evaluation               AM-PAC PT "6 Clicks" Mobility  Outcome Measure Help needed turning from your back to your side while in a flat bed without using bedrails?: None Help needed moving from lying on your back to sitting on the side of a flat bed without using bedrails?: None Help needed moving to and from a bed to a chair (including a wheelchair)?: None Help needed standing up from a chair using your arms (e.g., wheelchair or bedside chair)?: None Help needed to walk in hospital room?: None Help needed climbing 3-5 steps with a railing? : A Little 6 Click Score: 23    End of Session   Activity Tolerance: Patient tolerated treatment well Patient left: in bed;with call bell/phone within reach Nurse Communication: Mobility status PT Visit Diagnosis: Other abnormalities of gait and mobility (R26.89)    Time: 4270-6237 PT Time Calculation (min) (ACUTE ONLY): 14 min   Charges:   PT Evaluation $PT Eval Low Complexity: 1 Low          Eduard Clos, PT, DPT  Acute Rehabilitation Services Office [place] 02/05/2021, 12:34 PM

## 2021-02-05 NOTE — Progress Notes (Signed)
Pt's code status changed to DNR. Verbalized understanding. Order verified with Hilaria Ota, RN and myself at bedside. Purple DNR band placed on Rt arm.

## 2021-02-05 NOTE — Consult Note (Addendum)
Palliative Medicine Inpatient Consult Note  Consulting Provider: Hosie Poisson, MD  Reason for consult:   Anna Mathews Palliative Medicine Consult  Reason for Consult? goals of care   HPI:  Per intake H&P --> Anna Mathews is a 81 y.o. female with medical history significant for hypertension, paroxysmal atrial fibrillation on Eliquis, GERD and hyperlipidemia who presents as a transfer from outside ED for concerns of hypertensive urgency.  Palliative care has been asked to get involved to further discuss goals of care in the setting of multiple chronic comorbid conditions.  Clinical Assessment/Goals of Care:  *Please note that this is a verbal dictation therefore any spelling or grammatical errors are due to the "[employer]" system interpretation.  I have reviewed medical records including EPIC notes, labs and imaging, received report from bedside RN, assessed the patient who is sitting in bed in no acute distress.    I met with and Anna Mathews to further discuss diagnosis prognosis, GOC, EOL wishes, disposition and options.   I introduced Palliative Medicine as specialized medical care for people living with serious illness. It focuses on providing relief from the symptoms and stress of a serious illness. The goal is to improve quality of life for both the patient and the family.  Medical History Review and Understanding:  And shares with me her knowledge of having atrial fibrillation for which she is on Eliquis.  She expresses that she has high blood pressure which has been an ongoing battle in terms of medication management due to her purpura.  She expresses that she sees a dermatologist for this though if not on the right regimen she "has a breakout all over her body".  She shares that she has been on roughly 6 antihypertensives and her medical doctors have been trying to manage her as best as possible with causing her the least amount of side  effects.  Social History:  And is from [place], [place].  She lived in the [place] area for 49 years of her life and after her husband's death 5 years ago moved back to the [place] area.  She presently lives in a [place] within "[place]" complex.  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  She and her husband had 2 children 1 daughter who is still with [place] and 1 son who died tragically 2 years ago.  She has a fierce love of animals and has had over 82 dogs in her life.  She helped with the establishment of the [facility].  She is a woman of faith and practices within the [employer] denomination.  Functional and Nutritional State:  Prior to admission and had been living independently able to perform all BADLs and IADLs without needing assistance.  From a nutritional perspective and has had a hearty appetite without any deviations or involuntary weight loss.  Palliative Symptoms:  And shares that prior to admission she was having incremental nausea which is since resolved.  Advance Directives:  A detailed discussion was had today regarding advanced directives - patient has completed these in the past.  Code Status:  Concepts specific to code status, artifical feeding and hydration, continued IV antibiotics and rehospitalization was had.  A MOST form was introduced and completed:  Cardiopulmonary Resuscitation: Do Not Attempt Resuscitation (DNR/No CPR)  Medical Interventions: Limited Additional Interventions: Use medical treatment, IV fluids and cardiac monitoring as indicated, DO NOT USE intubation or mechanical ventilation. May consider use of less invasive airway  support such as BiPAP or CPAP. Also provide comfort measures. Transfer to the hospital if indicated. Avoid intensive care.   Antibiotics: Antibiotics if indicated  IV Fluids: IV fluids if indicated  Feeding Tube: Feeding tube for a defined trial period   Goals for the  Future:  Goals for the future are to remain as independent as possible for as long as possible.  And is clear that if she had a life which was confined to relying on other people and being bedbound this would be inconsistent with the quality of life she would wish to live.  Discussed the importance of continued conversation with family and their  medical providers regarding overall plan of care and treatment options, ensuring decisions are within the context of the patients values and GOCs.  Decision Maker: And can make decisions for herself though if she were ever incapacitated she rely on her daughter, Anna Mathews to be her primary decision maker  SUMMARY OF RECOMMENDATIONS   DNAR/DNI   MOST Completed, paper copy placed onto the chart electric copy can be found in [facility]  DNR Form Completed, paper copy placed onto the chart electric copy can be found in [facility]  Goals: remain independent  Ongoing support  Code Status/Advance Care Planning: DNAR/DNI  Palliative Prophylaxis:  Aspiration, Bowel Regimen, Delirium Protocol, Frequent Pain Assessment, Oral Care, Palliative Wound Care, and Turn Reposition  Additional Recommendations (Limitations, Scope, Preferences): Continue current scope of care  Psycho-social/Spiritual:  Desire for further Chaplaincy support: Not presently Additional Recommendations: Education on chronic disease processes   Prognosis: Unclear - very well functioning  Discharge Planning: Discharge to home.  Vitals:   02/05/21 0824 02/05/21 0924  BP:  (!) 138/49  Pulse: 63 70  Resp:    Temp:    SpO2: 98% 96%    Intake/Output Summary (Last 24 hours) at 02/05/2021 1200 Last data filed at 02/05/2021 0300 Gross per 24 hour  Intake 775.2 ml  Output --  Net 775.2 ml   Last Weight  Most recent update: 02/03/2021 10:14 PM    Weight  60.6 kg (133 lb 8 oz)            Gen:  Elderly Caucasian F in NAD HEENT: moist mucous membranes CV: Regular rate and  rhythm  PULM: clear to auscultation bilaterally  ABD: soft/nontender  EXT: No edema  Neuro: Alert and oriented x3   PPS: 60%   This conversation/these recommendations were discussed with patient primary care team, Dr. Karleen Hampshire  MDM High  Medical Decision Making: 4 #/Complex Problems: 4                  Data Reviewed: 4              Management: 4 (1-Straightforward, 2-Low, 3-Moderate, 4-High) ______________________________________________________ [place] Team Team Cell Phone: [phone] Please utilize secure chat with additional questions, if there is no response within 30 minutes please call the above phone number  Palliative Medicine Team providers are available by phone from 7am to 7pm daily and can be reached through the team cell phone.  Should this patient require assistance outside of these hours, please call the patient's attending physician.

## 2021-02-06 NOTE — Telephone Encounter (Signed)
Called and spoke to patient. She states that she was recently seen in the hospital for HTN. She states that they were able to get her BP down while in the hospital but continues to have trouble with it being elevated since being discharged. Patient takes irbesartan 75 mg QHS. She states that when she woke up this morning that her SBP was in the 170s and she had a headache, so she took another 75 mg of irbesartan. When she rechecked her BP it was 136/73.   Gave patient the recommendations from Dr. Irish Lack that the patient may take an additional 75 mg of irbesartan if SBP > 150. Made patient aware that after 1 hour of additional dose if her SBP is still > 150, then she may take an additional 75 mg of irbesartan. Made patient aware that the max daily dose of irbesartan was 300 mg. She verbalized understanding.   We reviewed limiting salt in her diet and the proper way to check her BP. Patient is scheduled to see Dr. Curt Bears tomorrow and Laurann Montana on 2/14.Instructed the patient to let us know if her BP remains elevated or if she is having to use additional doses of her irbesartan on a regular basis as we may need to change her Rx. She verbalized understanding and thanked me for the call.

## 2021-02-06 NOTE — Telephone Encounter (Signed)
Patient is following up. She is requesting to have Dr. Irish Lack review recent ED notes. She also scheduled a hospital follow-up for 02/14 with Laurann Montana, NP.

## 2021-02-07 ENCOUNTER — Ambulatory Visit: Payer: Medicare PPO | Admitting: Cardiology

## 2021-02-07 ENCOUNTER — Other Ambulatory Visit: Payer: Self-pay

## 2021-02-07 ENCOUNTER — Encounter: Payer: Self-pay | Admitting: Cardiology

## 2021-02-07 VITALS — BP 158/68 | HR 68 | Ht 64.0 in | Wt 137.0 lb

## 2021-02-07 DIAGNOSIS — D6869 Other thrombophilia: Secondary | ICD-10-CM | POA: Diagnosis not present

## 2021-02-07 DIAGNOSIS — I10 Essential (primary) hypertension: Secondary | ICD-10-CM | POA: Diagnosis not present

## 2021-02-07 DIAGNOSIS — I480 Paroxysmal atrial fibrillation: Secondary | ICD-10-CM | POA: Diagnosis not present

## 2021-02-07 MED ORDER — CARVEDILOL 6.25 MG PO TABS: 6.2500 mg | ORAL_TABLET | Freq: Two times a day (BID) | ORAL | 3 refills | Status: DC

## 2021-02-07 MED ORDER — IRBESARTAN 150 MG PO TABS: 150.0000 mg | ORAL_TABLET | Freq: Every day | ORAL | 2 refills | Status: DC

## 2021-02-07 NOTE — Patient Instructions (Addendum)
Medication Instructions:  Your physician has recommended you make the following change in your medication:  INCREASE Irbesartan to 150 mg START Carvedilol (Coreg) 6.25 mg twice daily  *If you need a refill on your cardiac medications before your next appointment, please call your pharmacy*   Lab Work: None ordered   Testing/Procedures: None ordered   Follow-Up: At [facility], you and your health needs are our priority.  As part of our continuing mission to provide you with exceptional heart care, we have created designated Provider Care Teams.  These Care Teams include your primary Cardiologist (physician) and Advanced Practice Providers (APPs -  Physician Assistants and Nurse Practitioners) who all work together to provide you with the care you need, when you need it.  We recommend signing up for the patient portal called "MyChart".  Sign up information is provided on this After Visit Summary.  MyChart is used to connect with patients for Virtual Visits (Telemedicine).  Patients are able to view lab/test results, encounter notes, upcoming appointments, etc.  Non-urgent messages can be sent to your provider as well.   To learn more about what you can do with MyChart, go to [email or web address].    Your next appointment:   6 month(s)  The format for your next appointment:   In Person  Provider:   Allegra Lai, MD  You have been referred to the Hypertension clinic.   Thank you for choosing [facility]!!   Trinidad Curet, RN [phone]   Other Instructions

## 2021-02-07 NOTE — Progress Notes (Signed)
Electrophysiology Office Note   Date:  02/07/2021   ID:  Anna Mathews, DOB 12-27-1940, MRN [id number]  PCP:  Vernie Shanks, MD  Cardiologist:  Casandra Doffing Primary Electrophysiologist:  Merle Whitehorn Meredith Leeds, MD    No chief complaint on file.     History of Present Illness: Anna Mathews is a 81 y.o. female who is being seen today for the evaluation of atrial fibrillation at the request of Ermalinda Barrios. Presenting today for electrophysiology evaluation.    She has a history significant for atrial fibrillation on flecainide, hypertension, hyperlipidemia.  She had a colonoscopy and was noted to be in atrial fibrillation.  She had shortness of breath and fatigue.  She was started on flecainide and felt much improved.  She presented to the hospital 02/03/2021 with hypertensive urgency.  Today, denies symptoms of palpitations, chest pain, shortness of breath, orthopnea, PND, lower extremity edema, claudication, dizziness, presyncope, syncope, bleeding, or neurologic sequela. The patient is tolerating medications without difficulties.  Since being in the hospital, she has continued to have elevated blood pressure home.  She states that multiple medication changes were made, though her blood pressure was not back down to goal.  She is quite anxious about her elevated blood pressures.   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 - tamoxifen X 28 months then change to Evista 10/06   BREAST LUMPECTOMY WITH RADIOACTIVE SEED LOCALIZATION Right 01/27/2020   Procedure:  RIGHT BREAST LUMPECTOMY WITH RADIOACTIVE SEED LOCALIZATION;  Surgeon: Coralie Keens, MD;  Location: [facility];  Service: General;  Laterality: Right;   BREAST SURGERY Left 4/99   breast biopsy - fibrosis   CARDIOVERSION N/A 07/16/2017   Procedure: CARDIOVERSION;  Surgeon: Jerline Pain, MD;  Location: [facility];  Service: Cardiovascular;  Laterality: N/A;   CARDIOVERSION N/A 10/20/2018   Procedure: CARDIOVERSION;  Surgeon: Buford Dresser, MD;  Location: [facility];  Service: Cardiovascular;  Laterality: N/A;   CATARACT EXTRACTION W/ INTRAOCULAR LENS IMPLANT Right    CESAREAN SECTION     X 2   HYSTEROSCOPY  4/95   w/D&C   TEE WITHOUT CARDIOVERSION N/A 10/20/2018   Procedure: TRANSESOPHAGEAL ECHOCARDIOGRAM (TEE);  Surgeon: Buford Dresser, MD;  Location: [facility] ENDOSCOPY;  Service: Cardiovascular;  Laterality: N/A;     Current Outpatient Medications  Medication Sig Dispense Refill   acetaminophen (TYLENOL) 500 MG tablet Take 500-1,000 mg by mouth every 6 (six) hours as needed for headache or mild pain.     aspirin 81 MG chewable tablet Chew 81 mg by mouth daily.     Biotin 5000 MCG TABS Take 5,000 mcg by mouth daily.     Calcium-Magnesium-Vitamin D (CALCIUM 1200+D3 PO) Take 1 tablet by mouth in the morning and at bedtime.     carvedilol (COREG) 6.25 MG tablet Take 1 tablet (6.25 mg total) by mouth 2 (two) times daily. 180 tablet 3   ELIQUIS 5 MG TABS tablet TAKE 1 TABLET BY MOUTH TWICE A DAY (Patient taking differently: Take 5 mg by mouth 2 (two) times  daily.) 60 tablet 5   flecainide (TAMBOCOR) 150 MG tablet Take 1 tablet (150 mg total) by mouth 2 (two) times daily. 180 tablet 3   irbesartan (AVAPRO) 150 MG tablet Take 1 tablet (150 mg total) by mouth daily. 90 tablet 2   Omega-3 Fatty Acids (FISH OIL) 1200 MG CAPS Take 1,200 mg by mouth in the morning and at bedtime.     Polyethyl Glycol-Propyl Glycol (LUBRICANT EYE DROPS) 0.4-0.3 % SOLN Place 1 drop into both eyes 3 (three)  times daily as needed (dry/irritated eyes).     simvastatin (ZOCOR) 20 MG tablet Take 20 mg by mouth every evening.      PROLIA 60 MG/ML SOSY injection INJECT 60MG  SUBCUTANEOUSLY  INTO UPPER ARM, THIGH, OR  ABDOMEN EVERY 6 MONTHS  (GIVEN AT PRESCRIBERS  OFFICE) (Patient not taking: Reported on 02/07/2021) 1 mL 1   No current facility-administered medications for this visit.    Allergies:   Penicillins, Amlodipine, Atorvastatin, Crab [shellfish allergy], and Sulfa antibiotics   Social History:  The patient  reports that she quit smoking about 53 years ago. Her smoking use included cigarettes. She started smoking about 64 years ago. She has a 3.00 pack-year smoking history. She has never used smokeless tobacco. She reports current alcohol use. She reports that she does not use drugs.   Family History:  [name] family history includes Breast cancer in her maternal grandmother; COPD in her father; Diabetes in her maternal aunt, maternal aunt, maternal grandfather, and son; Heart disease in her father; Hypertension in her mother; Kidney cancer in her brother; Rheum arthritis in her brother.   ROS:  Please see the history of present illness.   Otherwise, review of systems is positive for none.   All other systems are reviewed and negative.   PHYSICAL EXAM: VS:  BP (!) 158/68    Pulse 68    Ht 5\' 4"  (1.626 m)    Wt 137 lb (62.1 kg)    LMP 01/23/1988 (Approximate)    SpO2 98%    BMI 23.52 kg/m  , BMI Body mass index is 23.52 kg/m. GEN: Well nourished, well developed, in no acute distress  HEENT: normal  Neck: no JVD, carotid bruits, or masses Cardiac: RRR; no murmurs, rubs, or gallops,no edema  Respiratory:  clear to auscultation bilaterally, normal work of breathing GI: soft, nontender, nondistended, + BS MS: no deformity or atrophy  Skin: warm and dry Neuro:  Strength and sensation are intact Psych: euthymic mood, full affect  EKG:  EKG is ordered today. Personal review of the ekg  ordered shows this rhythm, rate 68  Recent Labs: 06/07/2020: NT-Pro BNP 309 11/03/2020: TSH 3.71 02/03/2021: ALT 15; Hemoglobin 13.8; Platelets 232 02/05/2021: BUN 27; Creatinine, Ser 1.16; Potassium 4.7; Sodium 140    Lipid Panel  No results found for: CHOL, TRIG, HDL, CHOLHDL, VLDL, LDLCALC, LDLDIRECT   Wt Readings from Last 3 Encounters:  02/07/21 137 lb (62.1 kg)  02/03/21 133 lb 8 oz (60.6 kg)  11/03/20 130 lb 9.6 oz (59.2 kg)      Other studies Reviewed: Additional studies/ records that were reviewed today include: TTE 02/04/21 Review of the above records today demonstrates:   1. Left ventricular ejection fraction, by estimation, is 60 to 65%. The  left ventricle has normal function. The left ventricle has no regional  wall motion abnormalities. There is mild asymmetric left ventricular  hypertrophy of the basal-septal segment.  Left ventricular diastolic parameters are indeterminate.  2. Right ventricular systolic function is normal. The right ventricular  size is normal. There is normal pulmonary artery systolic pressure. The  estimated right ventricular systolic pressure is 96.7 mmHg.   3. Left atrial size was moderately dilated.   4. The mitral valve is degenerative. Trivial mitral valve regurgitation.  Moderate to severe mitral annular calcification.   5. The aortic valve is tricuspid. There is mild calcification of the  aortic valve. There is mild thickening of the aortic valve. Aortic valve  regurgitation is not visualized. Aortic valve sclerosis/calcification is  present, without any evidence of  aortic stenosis.   6. The inferior vena cava is normal in size with greater than 50%  respiratory variability, suggesting right atrial pressure of 3 mmHg.     ASSESSMENT AND PLAN:  1.  Paroxysmal atrial fibrillation/flutter: Currently on Eliquis 5 mg twice daily, flecainide 150 mg twice daily.  CHA2DS2-VASc of 4.  High risk medication monitoring today by ECG for  flecainide.  QRS is wide but stable on her current dose.  Less than 25% of baseline.  Remains in sinus rhythm.  2.  Hypertension: Blood pressure is elevated today.  She also presented to the hospital with hypertensive urgency.  Anna Mathews increase Avapro to 150 mg daily.  We Anna Mathews start her on carvedilol 6.25 mg twice daily.  We Anna Mathews have her follow-up with the pharmacy hypertension clinic.  3.  Hyperlipidemia: Continue Zocor per primary physician  4.  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  Currently on Eliquis as above.  Current medicines are reviewed at length with the patient today.   The patient does not have concerns regarding her medicines.  The following changes were made today: Increase Avapro, start carvedilol  Labs/ tests ordered today include:  Orders Placed This Encounter  Procedures   AMB Referral to [facility]   EKG 12-Lead    Disposition:   FU with George Haggart 6 months  Signed, Corleone Biegler Meredith Leeds, MD  02/07/2021 3:16 PM     [place] [facility] [place] [address] [place] [address] [phone] (office) [phone] (fax)

## 2021-02-08 ENCOUNTER — Telehealth: Payer: Self-pay | Admitting: Interventional Cardiology

## 2021-02-08 NOTE — Telephone Encounter (Signed)
I spoke with patient. All readings below were taken today.  Last reading was 112/58.  Medications were changed at yesterday's office visit with Dr Curt Bears.  I told patient it may take a few days to see the changes in her BP readings.  She is scheduled to see pharmacist in hypertension clinic tomorrow.  Patient reports BP was elevated this AM so she contacted pharmacist and was told to take extra Irbesartan. She had taken Irbesartan last night as she always takes in the evenings.  She is asking if she should take dose tonight. I advised her to go ahead and take to stay on schedule. She is aware to bring BP readings and BP cuff to appointment with pharmacist tomorrow.

## 2021-02-08 NOTE — Telephone Encounter (Signed)
Pt c/o BP issue: STAT if pt c/o blurred vision, one-sided weakness or slurred speech  1. What are your last 5 BP readings? 98/44 hr 73, 100/46 hr 72, 186/87 , 182/82 hr 66, 112/58 hr 65  2. Are you having any other symptoms (ex. Dizziness, headache, blurred vision, passed out)? Headache   3. What is your BP issue? Blood pressure is all over the place after changing medication

## 2021-02-09 ENCOUNTER — Ambulatory Visit: Payer: Medicare PPO | Admitting: Pharmacist

## 2021-02-09 ENCOUNTER — Other Ambulatory Visit: Payer: Self-pay

## 2021-02-09 VITALS — BP 144/72 | HR 65

## 2021-02-09 DIAGNOSIS — I10 Essential (primary) hypertension: Secondary | ICD-10-CM

## 2021-02-09 DIAGNOSIS — H26491 Other secondary cataract, right eye: Secondary | ICD-10-CM | POA: Diagnosis not present

## 2021-02-09 NOTE — Patient Instructions (Addendum)
It was nice to meet you today!  Your blood pressure goal is <130-140/80-45mmHg.  Continue to take your blood pressure one to two times daily and keep a detailed log of your pressures.  Stop taking irbesartan 150mg  daily.  Continue taking carvedilol 6.25mg  twice daily (every 12 hours). If systolic blood pressure (top number) is less than 100, then you can skip the next dose.

## 2021-02-09 NOTE — Progress Notes (Signed)
Patient ID: Anna Mathews                 DOB: 07-25-1940                      MRN: [id number]     HPI: Anna Mathews is a 81 y.o. female referred by Dr. Curt Bears to HTN clinic. PMH is significant for Afib (on flecainide, CHADSVAsc of 4), HTN, and HLD. She was previously on amlodipine and quinapril for about 15 years. 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  Amlodipine could not be ruled out as the cause.  Her amlodipine was then switched to chlorthalidone but later discontinued since it was causing labile blood pressure and dizziness. Her quinapril was also later discontinued due to hypotension despite attempts to decrease dosage.  She was started on irbesartan at 75 mg in November and was advised to take extra dose if her blood pressure increased above 150.   Most recently, she presented to the hospital on 02/03/2021 with hypertensive urgency and a severe left-sided headache. Stroke work-up was negative. In the ED, she was hypertensive up to 220s/80s and had become hypotensive down to 80s/30s with bradycardia after a repeat dose of hydralazine 5 mg IV. She had received 10 mg IV about 4 hours prior. Labetalol 5 mg q6h prn for SBP >180 and/or DBP >110 was added and pt was discharged home normotensive. Noted to have a systolic murmur on exam, and echo revealed an LVEF of 60-65%  On the 16th, pt reported BP remained elevated. She was advised to take an additional 75 mg of irbesartan if SBP > 150. Made patient aware that after 1 hour of additional dose if her SBP is still > 150, then she may take an additional 75 mg of irbesartan. Made patient aware that the max daily dose of irbesartan was 300 mg. She verbalized understanding.   At follow up visit on 1/17, patient's blood pressure remained elevated, and irbesartan dose was increased to 150 mg daily and initiated on carvedilol 6.25mg  BID. Referred to PharmD for f/u.  Pt sent in a message  on 1/18 reporting a headache and BP readings ranging from 98-182/49-80s over the span of last 8 hours. She was advised to take an extra irbesartan 150mg  dose. She planned to bring BP readings and cuff to review with pharmacist.  Today, pt presents reporting fluctuating blood pressures over the past couple days. Her last dose of irbesartan was taken three days ago. She says it makes her feel like she can't get off the couch because she is so weak when she takes it in the morning. She moved her dose to the evening which helped. She has not missed any carvedilol doses. On 1/17, she took her blood pressure in the morning which was 192/87. She proceeded to take carvedilol which dropped it to 93/59. She did not take her irbesartan in fear of dropping the BP more. This morning, 1/19, she took her blood pressure again which was 129/73 and again took her carvedilol and skipped the irbesartan.   Pt reports she feels weaker when she has lower pressures. She does not like the irbesartan because she feels it drops her blood pressure drastically. She spoke about how she was on amlodipine and quinipril for years before she experienced a rash all over her body that felt like burning and pins and needles. She saw a dermatologist who  diagnosed her with Schamberg's purpura. She stopped both the amlodipine and quinipril, and the rash improved slightly, but then returned. She takes Tylenol as needed for headaches and does not drink coffee or tea regularly. No NSAID use. She brought her home blood pressure cuff, an Omron, to compare to our manual cuff. The blood pressures on both were within 5 mmHg of each other and she demonstrated appropriate BP technique.  Current HTN meds: Irbesartan 150mg  QHS (has not taken for 2 days), carvedilol 6.25mg  BID  Previously tried: amlodipine, quinapril (possible Schamberg's disease) Chlorthalidone (dizziness, labile BP)  BP goal: <130/26mmHg  Family History: Breast cancer in her maternal  grandmother; COPD in her father; Diabetes in her maternal aunt, maternal aunt, maternal grandfather, and son; Heart disease in her father; Hypertension in her mother; Kidney cancer in her brother; Rheum arthritis in her brother.    Social History: she quit smoking about 53 years ago. Her smoking use included cigarettes. She started smoking about 64 years ago. She has a 3.00 pack-year smoking history. She has never used smokeless tobacco. She reports current alcohol use. She reports that she does not use drugs.   Home cuff (in-clinic): 141/67  Manual clinic reading: 144/72  Wt Readings from Last 3 Encounters:  02/07/21 137 lb (62.1 kg)  02/03/21 133 lb 8 oz (60.6 kg)  11/03/20 130 lb 9.6 oz (59.2 kg)   BP Readings from Last 3 Encounters:  02/07/21 (!) 158/68  02/05/21 (!) 138/49  11/03/20 132/80   Pulse Readings from Last 3 Encounters:  02/07/21 68  02/05/21 70  11/03/20 68    Renal function: Estimated Creatinine Clearance: 32.3 mL/min (A) (by C-G formula based on SCr of 1.16 mg/dL (H)).  Past Medical History:  Diagnosis Date   Atrial fibrillation ([facility]) 09/2009   Breast cancer ([place])    Diverticulosis 09/2006   GERD (gastroesophageal reflux disease)    Hiatal hernia    Hyperlipidemia    Hypertension    Osteopenia    Postmenopausal hormone replacement therapy 1989 - 07/2000    Current Outpatient Medications on File Prior to Visit  Medication Sig Dispense Refill   acetaminophen (TYLENOL) 500 MG tablet Take 500-1,000 mg by mouth every 6 (six) hours as needed for headache or mild pain.     aspirin 81 MG chewable tablet Chew 81 mg by mouth daily.     Biotin 5000 MCG TABS Take 5,000 mcg by mouth daily.     Calcium-Magnesium-Vitamin D (CALCIUM 1200+D3 PO) Take 1 tablet by mouth in the morning and at bedtime.     carvedilol (COREG) 6.25 MG tablet Take 1 tablet (6.25 mg total) by mouth 2 (two) times daily. 180 tablet 3   ELIQUIS 5 MG TABS tablet TAKE 1 TABLET BY MOUTH TWICE A DAY  (Patient taking differently: Take 5 mg by mouth 2 (two) times daily.) 60 tablet 5   flecainide (TAMBOCOR) 150 MG tablet Take 1 tablet (150 mg total) by mouth 2 (two) times daily. 180 tablet 3   irbesartan (AVAPRO) 150 MG tablet Take 1 tablet (150 mg total) by mouth daily. 90 tablet 2   Omega-3 Fatty Acids (FISH OIL) 1200 MG CAPS Take 1,200 mg by mouth in the morning and at bedtime.     Polyethyl Glycol-Propyl Glycol (LUBRICANT EYE DROPS) 0.4-0.3 % SOLN Place 1 drop into both eyes 3 (three) times daily as needed (dry/irritated eyes).     PROLIA 60 MG/ML SOSY injection INJECT 60MG  SUBCUTANEOUSLY  INTO UPPER ARM, THIGH, OR  ABDOMEN EVERY 6 MONTHS  (GIVEN AT PRESCRIBERS  OFFICE) (Patient not taking: Reported on 02/07/2021) 1 mL 1   simvastatin (ZOCOR) 20 MG tablet Take 20 mg by mouth every evening.      No current facility-administered medications on file prior to visit.    Allergies  Allergen Reactions   Penicillins Other (See Comments)    REACTION: "ITCHING IN EYES" Has patient had a PCN reaction causing immediate rash, facial/tongue/throat swelling, SOB or lightheadedness with hypotension: Unknown Has patient had a PCN reaction causing severe rash involving mucus membranes or skin necrosis: Unknown Has patient had a PCN reaction that required hospitalization: No Has patient had a PCN reaction occurring within the last 10 years: No If all of the above answers are "NO", then may proceed with Cephalosporin use.    Amlodipine Itching   Atorvastatin Other (See Comments)    Other reaction(s): messed with her liver   Crab [Shellfish Allergy] Itching    Full body itching   Sulfa Antibiotics Other (See Comments)    REACTION: " NOT SURE" Other reaction(s): eyes itch     Assessment/Plan:  1. Hypertension - BP is elevated above goal of <130/37mmHg, but has been fluctuating greatly over the past week. Due to the fluctuating blood pressures and hypotensive episodes over the past few days, ok with  higher BP goal of < 140/64mmHg. Will also stop irbesartan 150mg  daily and continue carvedilol 6.25mg  BID as this has controlled her BP well over past few days. Informed patient that she can skip her next dose of carvedilol if she sees SBP <14mmHg. Recommended patient to monitor her blood pressure no more than 1-2x daily. Plan to call in 2 weeks to follow up with patient.   Pt seen with Jethro Poling, P4 pharmacy student  [facility]. Supple, PharmD, BCACP, [facility] [address], [place], [place] [address] Phone: [phone]; Fax: [phone] 02/09/2021 5:06 PM

## 2021-02-12 NOTE — Discharge Summary (Signed)
Physician Discharge Summary  Anna Mathews VOZ:[id number] DOB: 06-23-1940 DOA: 02/03/2021  PCP: Vernie Shanks, MD  Admit date: 02/03/2021 Discharge date: 02/05/2021  Admitted From: Home.  Disposition:  Home.   Recommendations for Outpatient Follow-up:  Follow up with PCP in 1-2 weeks Please obtain BMP/CBC in one week   Discharge Condition:stable.  CODE STATUS:Full code.  Diet recommendation: Heart Healthy   Brief/Interim Summary:  Anna Mathews is a 81 y.o. female with medical history significant for hypertension, paroxysmal atrial fibrillation on Eliquis, GERD and hyperlipidemia who presents as a transfer from outside ED for concerns of hypertensive urgency.   She has had multiple antihypertensive changes in the past 6 months by cardiology due to labile blood pressure.  She was previously on amlodipine and quinapril for about 15 years.  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  Amlodipine cannot be ruled out as the cause.  Her amlodipine was then switched to chlorthalidone but later discontinued since it was causing labile blood pressure and dizziness. Her quinapril was also later discontinued due to hypotension despite attempts to decrease dosage.  She was started on irbesartan at 75 mg in November and was advised to take extra dose if her blood pressure goes above 150.   she was hypertensive up to 220s over 80s and had became hypotensive down to 80 over 30s and bradycardic following a repeat dose of IV 5 mg of hydralazine. Discharge Diagnoses:  Active Problems:   Paroxysmal atrial fibrillation (HCC)   Hypertensive urgency   Labile blood pressure  Hypertensive Urgency:  Resolved.  Pt 's headache has resolved.  Echo is unremarkable.  Unclear what brought the episode.  Plan to resume Irbesartan at the current dose of 75 mg on discharge  Discussed the plan with the patient and daughter.        PAF;  Rate  controlled and is on eliquis for anticoagulation.  Resume flecainide as she and daughter reports taking it.        Headache  Resolved.  CTA unremarkable.      Mild AKI;  Suspect from dehydration.  Gently hydrate and repeat renal parameters in am show improvement.     Discharge Instructions  Discharge Instructions     Diet - low sodium heart healthy   Complete by: As directed    Discharge instructions   Complete by: As directed    Please follow up with Dr Irish Lack in one week.      Allergies as of 02/05/2021       Reactions   Penicillins Other (See Comments)   REACTION: "ITCHING IN EYES" Has patient had a PCN reaction causing immediate rash, facial/tongue/throat swelling, SOB or lightheadedness with hypotension: Unknown Has patient had a PCN reaction causing severe rash involving mucus membranes or skin necrosis: Unknown Has patient had a PCN reaction that required hospitalization: No Has patient had a PCN reaction occurring within the last 10 years: No If all of the above answers are "NO", then may proceed with Cephalosporin use.   Amlodipine Itching   Atorvastatin Other (See Comments)   Crab [shellfish Allergy] Itching   Full body itching   Sulfa Antibiotics Other (See Comments)   REACTION: " NOT SURE"        Medication List     TAKE these medications    acetaminophen 500 MG tablet Commonly known as: TYLENOL Take 500-1,000 mg by mouth every 6 (six) hours as  needed for headache or mild pain.   aspirin 81 MG chewable tablet Chew 81 mg by mouth daily.   Biotin 5000 MCG Tabs Take 5,000 mcg by mouth daily.   CALCIUM 1200+D3 PO Take 1 tablet by mouth in the morning and at bedtime.   Eliquis 5 MG Tabs tablet Generic drug: apixaban TAKE 1 TABLET BY MOUTH TWICE A DAY What changed: how much to take   Fish Oil 1200 MG Caps Take 1,200 mg by mouth in the morning and at bedtime.   flecainide 150 MG tablet Commonly known as: TAMBOCOR Take 1 tablet (150 mg  total) by mouth 2 (two) times daily.   Lubricant Eye Drops 0.4-0.3 % Soln Generic drug: Polyethyl Glycol-Propyl Glycol Place 1 drop into both eyes 3 (three) times daily as needed (dry/irritated eyes).   Prolia 60 MG/ML Sosy injection Generic drug: denosumab INJECT 60MG  SUBCUTANEOUSLY  INTO UPPER ARM, THIGH, OR  ABDOMEN EVERY 6 MONTHS  (GIVEN AT [facility]  OFFICE) Notes to patient: As Before, As Directed   simvastatin 20 MG tablet Commonly known as: ZOCOR Take 20 mg by mouth every evening.        Follow-up Information     Vernie Shanks, MD. Schedule an appointment as soon as possible for a visit in 1 week(s).   Specialty: Family Medicine Contact information: [address] [place] [address] [phone]         Jettie Booze, MD .   Specialties: Cardiology, Radiology, Interventional Cardiology Contact information: [address] [place] [address] [phone]         Constance Haw, MD .   Specialty: Cardiology Contact information: [address] [place] [address] [phone]                Allergies  Allergen Reactions   Penicillins Other (See Comments)    REACTION: "ITCHING IN EYES" Has patient had a PCN reaction causing immediate rash, facial/tongue/throat swelling, SOB or lightheadedness with hypotension: Unknown Has patient had a PCN reaction causing severe rash involving mucus membranes or skin necrosis: Unknown Has patient had a PCN reaction that required hospitalization: No Has patient had a PCN reaction occurring within the last 10 years: No If all of the above answers are "NO", then may proceed with Cephalosporin use.    Amlodipine Itching   Atorvastatin Other (See Comments)    Other reaction(s): messed with her liver   Crab [Shellfish Allergy] Itching    Full body itching   Sulfa Antibiotics Other (See Comments)    REACTION: " NOT SURE" Other reaction(s): eyes itch     Consultations: None.    Procedures/Studies: CT HEAD WO CONTRAST  Result Date: 02/03/2021 CLINICAL DATA:  Dizziness EXAM: CT HEAD WITHOUT CONTRAST TECHNIQUE: Contiguous axial images were obtained from the base of the skull through the vertex without intravenous contrast. RADIATION DOSE REDUCTION: This exam was performed according to the departmental dose-optimization program which includes automated exposure control, adjustment of the mA and/or kV according to patient size and/or use of iterative reconstruction technique. COMPARISON:  CT head 05/30/2016 FINDINGS: Brain: There is no evidence of acute intracranial hemorrhage, extra-axial fluid collection, or acute infarct. There is mild global parenchymal volume loss with prominence of the ventricular system and extra-axial CSF spaces. 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 There is no solid mass lesion.  There is no midline shift. Vascular: There is  calcification of the bilateral cavernous ICAs. Skull: Normal. Negative for fracture or focal lesion. Sinuses/Orbits: The imaged paranasal sinuses are clear. A right lens implant is noted. The globes and orbits are otherwise unremarkable. Other: None. IMPRESSION: No acute intracranial pathology. Electronically Signed   By: Valetta Mole M.D.   On: 02/03/2021 11:47   ECHOCARDIOGRAM COMPLETE  Result Date: 02/04/2021    ECHOCARDIOGRAM REPORT   Patient Name:   Lewanda G Palazzi Date of Exam: 02/04/2021 Medical Rec #:  [id number]     Height:       64.0 in Accession #:    [id number]    Weight:       133.5 lb Date of Birth:  [birth year]    BSA:          1.648 m Patient Age:    72 years      BP:           113/45 mmHg Patient Gender: F             HR:           68 bpm. Exam Location:  Inpatient Procedure: 2D Echo, Color Doppler and Cardiac Doppler Indications:    R01.1 Murmur  History:        Patient has prior history of  Echocardiogram examinations, most                 recent 07/08/2020. Arrythmias:Atrial Fibrillation; Risk                 Factors:Hypertension and Dyslipidemia.  Sonographer:    Raquel Sarna Senior RDCS Referring Phys: [id number] [place]  1. Left ventricular ejection fraction, by estimation, is 60 to 65%. The left ventricle has normal function. The left ventricle has no regional wall motion abnormalities. There is mild asymmetric left ventricular hypertrophy of the basal-septal segment. Left ventricular diastolic parameters are indeterminate.  2. Right ventricular systolic function is normal. The right ventricular size is normal. There is normal pulmonary artery systolic pressure. The estimated right ventricular systolic pressure is 09.3 mmHg.  3. Left atrial size was moderately dilated.  4. The mitral valve is degenerative. Trivial mitral valve regurgitation. Moderate to severe mitral annular calcification.  5. The aortic valve is tricuspid. There is mild calcification of the aortic valve. There is mild thickening of the aortic valve. Aortic valve regurgitation is not visualized. Aortic valve sclerosis/calcification is present, without any evidence of aortic stenosis.  6. The inferior vena cava is normal in size with greater than 50% respiratory variability, suggesting right atrial pressure of 3 mmHg. Comparison(s): No significant change from prior study. FINDINGS  Left Ventricle: Left ventricular ejection fraction, by estimation, is 60 to 65%. The left ventricle has normal function. The left ventricle has no regional wall motion abnormalities. The left ventricular internal cavity size was normal in size. There is  mild asymmetric left ventricular hypertrophy of the basal-septal segment. Left ventricular diastolic parameters are indeterminate. Right Ventricle: The right ventricular size is normal. No increase in right ventricular wall thickness. Right ventricular systolic function is normal. There is normal  pulmonary artery systolic pressure. The tricuspid regurgitant velocity is 2.42 m/s, and  with an assumed right atrial pressure of 3 mmHg, the estimated right ventricular systolic pressure is 26.7 mmHg. Left Atrium: Left atrial size was moderately dilated. Right Atrium: Right atrial size was normal in size. Pericardium: There is no evidence of pericardial effusion. Mitral Valve: The mitral valve is degenerative in appearance. There is mild  thickening of the mitral valve leaflet(s). There is mild calcification of the mitral valve leaflet(s). Moderate to severe mitral annular calcification. Trivial mitral valve regurgitation. Tricuspid Valve: The tricuspid valve is normal in structure. Tricuspid valve regurgitation is trivial. Aortic Valve: The aortic valve is tricuspid. There is mild calcification of the aortic valve. There is mild thickening of the aortic valve. Aortic valve regurgitation is not visualized. Aortic valve sclerosis/calcification is present, without any evidence of aortic stenosis. Pulmonic Valve: The pulmonic valve was normal in structure. Pulmonic valve regurgitation is trivial. Aorta: The aortic root and ascending aorta are structurally normal, with no evidence of dilitation. Venous: The inferior vena cava is normal in size with greater than 50% respiratory variability, suggesting right atrial pressure of 3 mmHg. IAS/Shunts: The atrial septum is grossly normal.  LEFT VENTRICLE PLAX 2D LVIDd:         4.30 cm   Diastology LVIDs:         2.60 cm   LV e' medial:    6.65 cm/s LV PW:         0.80 cm   LV E/e' medial:  19.1 LV IVS:        1.00 cm   LV e' lateral:   12.60 cm/s LVOT diam:     2.00 cm   LV E/e' lateral: 10.1 LV SV:         83 LV SV Index:   50 LVOT Area:     3.14 cm  RIGHT VENTRICLE RV S prime:     13.50 cm/s TAPSE (M-mode): 2.5 cm LEFT ATRIUM             Index        RIGHT ATRIUM           Index LA diam:        4.50 cm 2.73 cm/m   RA Area:     14.60 cm LA Vol (A2C):   64.6 ml 39.21 ml/m   RA Volume:   32.30 ml  19.60 ml/m LA Vol (A4C):   61.6 ml 37.39 ml/m LA Biplane Vol: 64.0 ml 38.85 ml/m  AORTIC VALVE LVOT Vmax:   120.00 cm/s LVOT Vmean:  87.200 cm/s LVOT VTI:    0.263 m  AORTA Ao Root diam: 3.20 cm Ao Asc diam:  3.40 cm MITRAL VALVE                TRICUSPID VALVE MV Area (PHT): 2.96 cm     TR Peak grad:   23.4 mmHg MV Decel Time: 256 msec     TR Vmax:        242.00 cm/s MV E velocity: 127.00 cm/s MV A velocity: 58.30 cm/s   SHUNTS MV E/A ratio:  2.18         Systemic VTI:  0.26 m                             Systemic Diam: 2.00 cm Gwyndolyn Kaufman MD Electronically signed by Gwyndolyn Kaufman MD Signature Date/Time: 02/04/2021/1:14:33 PM    Final    CT ANGIO HEAD NECK W WO CM (CODE STROKE)  Result Date: 02/03/2021 CLINICAL DATA:  Provided history: Headache, sudden, severe. EXAM: CT ANGIOGRAPHY HEAD AND NECK TECHNIQUE: Multidetector CT imaging of the head and neck was performed using the standard protocol during bolus administration of intravenous contrast. Multiplanar CT image reconstructions and MIPs were obtained to evaluate the vascular anatomy. Carotid stenosis  measurements (when applicable) are obtained utilizing NASCET criteria, using the distal internal carotid diameter as the denominator. RADIATION DOSE REDUCTION: This exam was performed according to the departmental dose-optimization program which includes automated exposure control, adjustment of the mA and/or kV according to patient size and/or use of iterative reconstruction technique. CONTRAST:  149mL OMNIPAQUE IOHEXOL 350 MG/ML SOLN COMPARISON:  Noncontrast head CT performed earlier today 02/03/2021. CT cervical spine 05/30/2016. FINDINGS: CTA NECK FINDINGS Aortic arch: Standard aortic branching. Atherosclerotic plaque within the visualized aortic arch and proximal major branch vessels of the neck. No hemodynamically significant innominate or proximal subclavian artery stenosis. Right carotid system: CCA and ICA patent within  the neck without significant stenosis (50% or greater). Mild atherosclerotic plaque within the distal CCA, about the carotid bifurcation and within the proximal ICA. Left carotid system: CCA and ICA patent within the neck without significant stenosis (50% or greater). Mild atherosclerotic plaque within the distal CCA, about the carotid bifurcation and within the proximal ICA. Vertebral arteries: Vertebral arteries codominant and patent within the neck. Moderate stenosis at the origin of the left vertebral artery. Skeleton: Cervical spondylosis. Presumed chronic (given history) mild T4 vertebral superior endplate deformity. Other neck: 2.4 cm left thyroid lobe nodule. Upper chest: No consolidation within the imaged lung apices. Review of the MIP images confirms the above findings CTA HEAD FINDINGS Anterior circulation: The intracranial internal carotid arteries are patent. Calcified plaque within both vessels with no more than mild stenosis. The M1 middle cerebral arteries are patent. No M2 proximal branch occlusion or high-grade proximal stenosis is identified. The anterior cerebral arteries are patent. No intracranial aneurysm is identified. Posterior circulation: The intracranial vertebral arteries are patent. The basilar artery is patent. The posterior cerebral arteries are patent. A right posterior communicating artery is present. The left posterior communicating artery is diminutive or absent. Venous sinuses: Within the limitations of contrast timing, no convincing thrombus. Anatomic variants: As described. Review of the MIP images confirms the above findings IMPRESSION: CTA neck: 1. The common carotid and internal carotid arteries are patent within the neck without hemodynamically significant stenosis. Mild atherosclerotic plaque within both carotid systems within the neck, as described. 2. Vertebral arteries codominant and patent within the neck. Moderate stenosis at the origin of the left vertebral artery.  3. Aortic Atherosclerosis (ICD10-I70.0). 4. 2.4 cm left thyroid lobe nodule. A non-emergent thyroid ultrasound is recommended for further evaluation. CTA head: 1. No intracranial large vessel occlusion or proximal high-grade arterial stenosis. 2. Calcified plaque within the intracranial internal carotid arteries with no more than mild stenosis. Electronically Signed   By: Kellie Simmering D.O.   On: 02/03/2021 14:48      Subjective: No new complaints.   Discharge Exam: Vitals:   02/05/21 0824 02/05/21 0924  BP:  (!) 138/49  Pulse: 63 70  Resp:    Temp:    SpO2: 98% 96%   Vitals:   02/04/21 2125 02/05/21 0404 02/05/21 0824 02/05/21 0924  BP: (!) 120/53 (!) 179/69  (!) 138/49  Pulse: 71 67 63 70  Resp: 20 19    Temp: 98 F (36.7 C) 97.7 F (36.5 C)    TempSrc: Oral Oral    SpO2: 95% 93% 98% 96%  Weight:      Height:        General: Pt is alert, awake, not in acute distress Cardiovascular: RRR, S1/S2 +, no rubs, no gallops Respiratory: CTA bilaterally, no wheezing, no rhonchi Abdominal: Soft, NT, ND, bowel sounds + Extremities:  no edema, no cyanosis    The results of significant diagnostics from this hospitalization (including imaging, microbiology, ancillary and laboratory) are listed below for reference.     Microbiology: Recent Results (from the past 240 hour(s))  Resp Panel by RT-PCR (Flu A&B, Covid) Nasopharyngeal Swab     Status: None   Collection Time: 02/03/21  8:40 PM   Specimen: Nasopharyngeal Swab; Nasopharyngeal(NP) swabs in vial transport medium  Result Value Ref Range Status   SARS Coronavirus 2 by RT PCR NEGATIVE NEGATIVE Final    Comment: (NOTE) SARS-CoV-2 target nucleic acids are NOT DETECTED.  The SARS-CoV-2 RNA is generally detectable in upper respiratory specimens during the acute phase of infection. The lowest concentration of SARS-CoV-2 viral copies this assay can detect is 138 copies/mL. A negative result does not preclude SARS-Cov-2 infection  and should not be used as the sole basis for treatment or other patient management decisions. A negative result may occur with  improper specimen collection/handling, submission of specimen other than nasopharyngeal swab, presence of viral mutation(s) within the areas targeted by this assay, and inadequate number of viral copies(<138 copies/mL). A negative result must be combined with clinical observations, patient history, and epidemiological information. The expected result is Negative.  Fact Sheet for Patients:  EntrepreneurPulse.com.au  Fact Sheet for Healthcare Providers:  IncredibleEmployment.be  This test is no t yet approved or cleared by the [place] FDA and  has been authorized for detection and/or diagnosis of SARS-CoV-2 by FDA under an Emergency Use Authorization (EUA). This EUA will remain  in effect (meaning this test can be used) for the duration of the COVID-19 declaration under Section 564(b)(1) of the Act, 21 U.S.C.section 360bbb-3(b)(1), unless the authorization is terminated  or revoked sooner.       Influenza A by PCR NEGATIVE NEGATIVE Final   Influenza B by PCR NEGATIVE NEGATIVE Final    Comment: (NOTE) The Xpert Xpress SARS-CoV-2/FLU/RSV plus assay is intended as an aid in the diagnosis of influenza from Nasopharyngeal swab specimens and should not be used as a sole basis for treatment. Nasal washings and aspirates are unacceptable for Xpert Xpress SARS-CoV-2/FLU/RSV testing.  Fact Sheet for Patients: EntrepreneurPulse.com.au  Fact Sheet for Healthcare Providers: IncredibleEmployment.be  This test is not yet approved or cleared by the [place] FDA and has been authorized for detection and/or diagnosis of SARS-CoV-2 by FDA under an Emergency Use Authorization (EUA). This EUA will remain in effect (meaning this test can be used) for the duration of the COVID-19 declaration  under Section 564(b)(1) of the Act, 21 U.S.C. section 360bbb-3(b)(1), unless the authorization is terminated or revoked.  Performed at [facility], [address], [place], [place] [address]      Labs: BNP (last 3 results) No results for input(s): BNP in the last 8760 hours. Basic Metabolic Panel: No results for input(s): NA, K, CL, CO2, GLUCOSE, BUN, CREATININE, CALCIUM, MG, PHOS in the last 168 hours. Liver Function Tests: No results for input(s): AST, ALT, ALKPHOS, BILITOT, PROT, ALBUMIN in the last 168 hours. No results for input(s): LIPASE, AMYLASE in the last 168 hours. No results for input(s): AMMONIA in the last 168 hours. CBC: No results for input(s): WBC, NEUTROABS, HGB, HCT, MCV, PLT in the last 168 hours. Cardiac Enzymes: No results for input(s): CKTOTAL, CKMB, CKMBINDEX, TROPONINI in the last 168 hours. BNP: Invalid input(s): POCBNP CBG: No results for input(s): GLUCAP in the last 168 hours. D-Dimer No results for input(s): DDIMER in the last 72  hours. Hgb A1c No results for input(s): HGBA1C in the last 72 hours. Lipid Profile No results for input(s): CHOL, HDL, LDLCALC, TRIG, CHOLHDL, LDLDIRECT in the last 72 hours. Thyroid function studies No results for input(s): TSH, T4TOTAL, T3FREE, THYROIDAB in the last 72 hours.  Invalid input(s): FREET3 Anemia work up No results for input(s): VITAMINB12, FOLATE, FERRITIN, TIBC, IRON, RETICCTPCT in the last 72 hours. Urinalysis    Component Value Date/Time   COLORURINE YELLOW 02/05/2021 1042   APPEARANCEUR CLEAR 02/05/2021 1042   LABSPEC 1.025 02/05/2021 1042   PHURINE 5.0 02/05/2021 1042   GLUCOSEU 100 (A) 02/05/2021 1042   HGBUR NEGATIVE 02/05/2021 1042   BILIRUBINUR NEGATIVE 02/05/2021 1042   KETONESUR NEGATIVE 02/05/2021 1042   PROTEINUR NEGATIVE 02/05/2021 1042   UROBILINOGEN 0.2 10/27/2007 1414   NITRITE NEGATIVE 02/05/2021 1042   LEUKOCYTESUR NEGATIVE 02/05/2021 1042   Sepsis  Labs Invalid input(s): PROCALCITONIN,  WBC,  LACTICIDVEN Microbiology Recent Results (from the past 240 hour(s))  Resp Panel by RT-PCR (Flu A&B, Covid) Nasopharyngeal Swab     Status: None   Collection Time: 02/03/21  8:40 PM   Specimen: Nasopharyngeal Swab; Nasopharyngeal(NP) swabs in vial transport medium  Result Value Ref Range Status   SARS Coronavirus 2 by RT PCR NEGATIVE NEGATIVE Final    Comment: (NOTE) SARS-CoV-2 target nucleic acids are NOT DETECTED.  The SARS-CoV-2 RNA is generally detectable in upper respiratory specimens during the acute phase of infection. The lowest concentration of SARS-CoV-2 viral copies this assay can detect is 138 copies/mL. A negative result does not preclude SARS-Cov-2 infection and should not be used as the sole basis for treatment or other patient management decisions. A negative result may occur with  improper specimen collection/handling, submission of specimen other than nasopharyngeal swab, presence of viral mutation(s) within the areas targeted by this assay, and inadequate number of viral copies(<138 copies/mL). A negative result must be combined with clinical observations, patient history, and epidemiological information. The expected result is Negative.  Fact Sheet for Patients:  EntrepreneurPulse.com.au  Fact Sheet for Healthcare Providers:  IncredibleEmployment.be  This test is no t yet approved or cleared by the [place] FDA and  has been authorized for detection and/or diagnosis of SARS-CoV-2 by FDA under an Emergency Use Authorization (EUA). This EUA will remain  in effect (meaning this test can be used) for the duration of the COVID-19 declaration under Section 564(b)(1) of the Act, 21 U.S.C.section 360bbb-3(b)(1), unless the authorization is terminated  or revoked sooner.       Influenza A by PCR NEGATIVE NEGATIVE Final   Influenza B by PCR NEGATIVE NEGATIVE Final    Comment:  (NOTE) The Xpert Xpress SARS-CoV-2/FLU/RSV plus assay is intended as an aid in the diagnosis of influenza from Nasopharyngeal swab specimens and should not be used as a sole basis for treatment. Nasal washings and aspirates are unacceptable for Xpert Xpress SARS-CoV-2/FLU/RSV testing.  Fact Sheet for Patients: EntrepreneurPulse.com.au  Fact Sheet for Healthcare Providers: IncredibleEmployment.be  This test is not yet approved or cleared by the [place] FDA and has been authorized for detection and/or diagnosis of SARS-CoV-2 by FDA under an Emergency Use Authorization (EUA). This EUA will remain in effect (meaning this test can be used) for the duration of the COVID-19 declaration under Section 564(b)(1) of the Act, 21 U.S.C. section 360bbb-3(b)(1), unless the authorization is terminated or revoked.  Performed at [facility], [address] [place], [place] [address]      Time coordinating discharge:  39 minutes.   SIGNED:   Hosie Poisson, MD  [facility]

## 2021-02-14 ENCOUNTER — Telehealth: Payer: Self-pay | Admitting: Interventional Cardiology

## 2021-02-14 DIAGNOSIS — I10 Essential (primary) hypertension: Secondary | ICD-10-CM

## 2021-02-14 NOTE — Telephone Encounter (Signed)
Pt c/o BP issue: STAT if pt c/o blurred vision, one-sided weakness or slurred speech  1. What are your last 5 BP readings? 178/74, 119, 124, 109  2. Are you having any other symptoms (ex. Dizziness, headache, blurred vision, passed out)? Dizzy   3. What is your BP issue? Blood pressure is dropping really low after taking Carvedilol

## 2021-02-14 NOTE — Telephone Encounter (Signed)
I spoke with patient. She reports yesterday her BP was 192, then 204 and then 144.  She does not know diastolic reading. Today her BP has been 178/74, 149/65 and 119/53.  Took Coreg around 9:15.  Patient felt shaky and unsteady this AM.  She is concerned that readings are "all over the place".  Will forward to PharmD as patient was recently seen in hypertension clinic

## 2021-02-14 NOTE — Telephone Encounter (Signed)
Pt with very labile BP readings. Stopped irbesartan at recent visit due to it dropping her BP too low, now only on carvedilol 6.25mg  BID for her BP. Multiple prior med intolerances. Had been checking BP before taking her meds to determine whether or not she needed to take her meds. Per daughter, this was causing stress and she was advised to check BP no more than twice daily.   Called pt to discuss. She confirms she's taking carvedilol BID and has not skipped any doses or taken any extra doses. Reports BP this AM before meds was 178/74. Took carvedilol. F/u BP 143/65, HR 52, then 119/53. Mid day felt weak, checked again 155/63.  Will set pt up with 24 hour home BP monitor due to widely fluctuating BP readings. Will not adjust current meds at this time as BP dropped down to 119/53 after a dose of carvedilol. Pt is in agreement with plan.

## 2021-02-15 ENCOUNTER — Other Ambulatory Visit: Payer: Self-pay | Admitting: Interventional Cardiology

## 2021-02-15 DIAGNOSIS — R0989 Other specified symptoms and signs involving the circulatory and respiratory systems: Secondary | ICD-10-CM

## 2021-02-15 DIAGNOSIS — R030 Elevated blood-pressure reading, without diagnosis of hypertension: Secondary | ICD-10-CM

## 2021-02-16 ENCOUNTER — Other Ambulatory Visit: Payer: Self-pay

## 2021-02-16 ENCOUNTER — Ambulatory Visit (INDEPENDENT_AMBULATORY_CARE_PROVIDER_SITE_OTHER): Payer: Medicare PPO

## 2021-02-16 DIAGNOSIS — R0989 Other specified symptoms and signs involving the circulatory and respiratory systems: Secondary | ICD-10-CM

## 2021-02-16 DIAGNOSIS — R030 Elevated blood-pressure reading, without diagnosis of hypertension: Secondary | ICD-10-CM | POA: Diagnosis not present

## 2021-02-16 NOTE — Progress Notes (Unsigned)
24 Hour ambulatory monitor applied to patient using standard adult cuff.

## 2021-02-17 ENCOUNTER — Telehealth: Payer: Self-pay | Admitting: Interventional Cardiology

## 2021-02-17 MED ORDER — SPIRONOLACTONE 25 MG PO TABS: ORAL_TABLET | ORAL | 3 refills | Status: DC

## 2021-02-17 NOTE — Telephone Encounter (Signed)
Pt is having ongoing issues w/ bp fluctuation as a side effect of carvedilol (see previous notes).. she is done w/ blood pressure cuff and is requesting to speak w/ someone immediately about next steps as she is still having really bad fluctuations.. please advise.

## 2021-02-17 NOTE — Telephone Encounter (Addendum)
Pt set up with home 24 hour BP monitor yesterday due to widely fluctuating reported BP readings at home. The cuff has been dropped off but staff is not in the office until next Tuesday to interpret.  I saw pt in clinic 1 week ago, BP was 144/72 - ok with higher goal < 140/90 given pt's age and wide fluctuations in home BP readings. At that visit, she was only taking carvedilol 6.25mg  BID and reported tolerating well. Had self discontinued her irbesartan, reports it dropped her BP too low. Also intolerant to amlodipine (? rash), quinapril (? rash), and chlorthalidone (dizziness). Pt now wanting to stop carvedilol too due to BP continuing to fluctuate.  Will try low dose spironolactone 12.5mg  daily in the interim. Pt advised she can increase to 25mg  daily if needed.  Dr Irish Lack, any additional thoughts on BP regimen for her?

## 2021-02-17 NOTE — Telephone Encounter (Signed)
Spoke with pt and checked B/P last night and was 176/78 and pt was worried with that reading. Pt checked B/P this am and was 62/39 and was dizzy and felt wiped out. Per pt is going to stop Carvedilol Pt is wanting to be seen today no availability Will forward to Dr Irish Lack for review and recommendations .

## 2021-02-19 NOTE — Telephone Encounter (Signed)
Definitely avoid amlodipine.  We rechallenged and her rash came back.   I have used irbesartan on a prn basis.  COuld use 75 mg if dshe has a BP spike with systolic > 886.   Difficult situation since she developed the rash.

## 2021-02-20 DIAGNOSIS — R7303 Prediabetes: Secondary | ICD-10-CM | POA: Diagnosis not present

## 2021-02-20 DIAGNOSIS — L817 Pigmented purpuric dermatosis: Secondary | ICD-10-CM | POA: Diagnosis not present

## 2021-02-20 DIAGNOSIS — E782 Mixed hyperlipidemia: Secondary | ICD-10-CM | POA: Diagnosis not present

## 2021-02-20 DIAGNOSIS — I10 Essential (primary) hypertension: Secondary | ICD-10-CM | POA: Diagnosis not present

## 2021-02-20 DIAGNOSIS — I480 Paroxysmal atrial fibrillation: Secondary | ICD-10-CM | POA: Diagnosis not present

## 2021-02-23 NOTE — Telephone Encounter (Signed)
Called pt to follow up with home BP readings. She states she started taking amlodipine again because it controlled her BP well for 15 years even though she rechallenged with it recently and it caused a rash.  States she's taking amlodipine 2.5mg  daily and spironolactone 25mg  daily, both in the morning. Thinks she started the amlodipine at the same time I prescribed the spironolactone last week. Reports rash is coming back again.  Still stressing about her BP readings.  Yesterday: BP 145/71 in AM before meds, 117/56 at 3pm, 111/54 at 10:30pm. Today: BP 156/79 before meds, 124/54 a few hours later.  Advised pt all readings she listed were great (ok with slightly higher readings especially if they're before she takes her meds since her BP responds well after her meds). She was surprised by this and is still stressed out by her readings. Reviewed again that her BP goal is < 140/36mmHg.  I advised pt to stop taking amlodipine since it is again causing a rash. She will stay on spironolactone 25mg  daily in the morning. I'll call in a week to see how her readings are looking. Will need BMET if she continues on spironolactone.   For reference, 24 hr BP monitor results: Mean overall BP 138/55 Mean awake BP 140/56 Mean asleep BP 133/53 Dipping pattern: 7.4% systolic, 1.6% diastolic  Max BP: 384/53 Min BP: 91/30  Her BP truly do spike and dip quite frequently based on results of ambulatory BP monitor. Max and min both observed overnight, pt not sure if she woke up that night or if she was sleeping.

## 2021-02-23 NOTE — Telephone Encounter (Signed)
Any difference with felodipine, dilt or verapamil, rather than amlodipine. ?

## 2021-02-24 NOTE — Telephone Encounter (Signed)
Yesterday she took amlodipine and spironolactone This AM Before medication bp was 139/74 She then took spironolactone 25mg  with breakfast Around 11:00 she felt funny "and could hardly move or keep her eyes wide open- so she checked her BP and it was 111/55 At 3:00 112/52  A few min ago 125/58 She states she cant take the spironolactone. I advised that tomorrow if she checks her BP in the AM and its high to take spironolactone 12.5mg  (1/2 tablet) if BP is good, then skip taking. (Amlodipine may still be having some effect). Start taking spironolactone 12.5mg  daily as needed.

## 2021-02-27 NOTE — Telephone Encounter (Signed)
Called pt. All reported readings she gave were excellent. Reports she checked her BP Saturday morning at 10:30am and it was high at 165/73. She took spironolactone 25mg . BP at 11pm was 106/55, 114/58 on recheck. Reports arms and ankles were "stinging" and she had red skin. Confirmed she did stop amlodipine (prior rash).  Sunday 10am BP was 133/72. Didn't take spironolactone at all. Checked again 3x throughout the day and BP was 120/58, 134/65, and 142/56.  BP this morning was high at 174/87. She took 1/2 tablet of spironolactone and reports follow up BP was 89/62. Still reports burning on her arms and legs.  Forwarding to Dr Irish Lack for input - BP incredibly labile even with no medication adjustments (see 24 hr BP monitor results when she was only taking carvedilol - BP ranged 91/30 to 198/91 over the course of a day). Diltiazem and verapamil wouldn't affect BP as much and her HR is already in the 60s. Not sure if she'd tolerate felodipine better than amlodipine. Shorter acting med like hydralazine could potentially be helpful to target isolated high BP readings but checking her BP frequently already stresses her out and this would require her to check BP more frequently and adjust meds based off of readings.

## 2021-02-28 MED ORDER — HYDRALAZINE HCL 10 MG PO TABS: 10.0000 mg | ORAL_TABLET | Freq: Two times a day (BID) | ORAL | 5 refills | Status: DC

## 2021-02-28 MED ORDER — HYDRALAZINE HCL 10 MG PO TABS: 10.0000 mg | ORAL_TABLET | Freq: Three times a day (TID) | ORAL | 5 refills | Status: DC

## 2021-02-28 NOTE — Telephone Encounter (Signed)
Pt aware to stop spironolactone and start hydralazine 10mg  BID. She will monitor BP at home up to twice daily and I'll touch base in a week to see how her readings are looking.   Jettie Booze, MD  Mohd Clemons E, RPH-CPP I would try hydralazine 10 mg BID.  Vasodilator worked best for her in the past.  Need to start a low dose only and continue to monitor.   JV

## 2021-03-03 ENCOUNTER — Telehealth: Payer: Self-pay | Admitting: Interventional Cardiology

## 2021-03-03 NOTE — Telephone Encounter (Signed)
A fever?  Or an allergic reaction?  I see she has had allergic reactions to mediations in the past.  Regardless, would have her d/c for the weekend and continue to monitor BP and see if symptoms resolve

## 2021-03-03 NOTE — Progress Notes (Addendum)
Cardiology Office Note:    Date:  03/07/2021   ID:  Anna Mathews, DOB 1940/10/11, MRN [id number]  PCP:  Vernie Shanks, MD   [facility] Providers Cardiologist:  Larae Grooms, MD Electrophysiologist:  Will Meredith Leeds, MD     Referring MD: Vernie Shanks, MD   Chief Complaint: follow-up hypertension  History of Present Illness:    Anna Mathews is a 81 y.o. female with a hx of PAF, atypical atrial flutter on chronic anticoagulation, HTN, TIA, hyperlipidemia, labile BP, and aortic atherosclerosis.   She presented to [facility] ED on 02/03/21 for evaluation of left-sided headache and home SBP > 200 mmHg. She took an extra dose of irbesartan as advised by cardiology for SBP > 150 mmHg. She took amlodipine and quinapril for about 15 years and then in September 2022, she started having waxing and waning hot and tingly episodes associated with rash. 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 She stopped quinapril and amlodipine and rash improved slightly, then returned. She took chlorthalidone for a short time but developed dizziness. In the ED on 1/13, BP initially 220s over 80s  and then became hypotensive at 80s over 30s and bradycardic with a 5 mg dose of IV hydralazine and she was admitted. BP improved and work-up was unremarkable and she was d/c'ed on 02/05/21.   She was last seen in our office 02/09/21 by Fuller Canada, [facility] for management of BP. She was given BP goal of < 130-140/80-90 mmHg, and advised to d/c irbesartan 150 mg, continue carvedilol 6.25 every 12 hours with permission to hold dose if SBP < 100 mmHg. She called back a few days later with concerns about feeling weak and continued BP fluctuations. 24 hour BP monitor revealed episodic spikes with max SBP 177 mmHg, 57% > 481 mmHg, diastolic range WNL. She took spironolactone on a sliding scale up to 25 mg but continued to have big swings in BP. On 02/28/21 she was advised by Dr.  Irish Lack and Fuller Canada, [facility] to stop spironolactone and start hydralazine 10 mg BID.  She called our office on 2/10 with reports of redness on her arms and legs and a burning sensation.  She had decreased her dose of hydralazine for a few days and was advised on 2/13 to hold hydralazine and follow-up at office visit today.  Today, she is here alone.  She reports the redness and stinging have resolved.  She initially started to have symptoms of redness in her ankles approximately 6 months ago as noted above and since stopping amlodipine it will still occur randomly, usually with the initiation of new medication.  She is intolerant of multiple antihypertensives. She was advised by her dermatologist to treat purpura with anti-itch lotion.  We reviewed medications and she is unclear as to whether she ever started carvedilol as advised by Dr. Curt Bears.  She is quite anxious about her blood pressure and monitors at least 3 times daily. She denies chest pain, shortness of breath, fatigue, palpitations, melena, hematuria, hemoptysis, diaphoresis, weakness, presyncope, syncope, orthopnea, and PND. She has mild non-pitting bilateral lower extremity edema in both lower extremities. Has been walking her dog on a consistent basis but was more active prior to Covid pandemic. She plans to increase walking for exercise and yoga.    Past Medical History:  Diagnosis Date   Atrial fibrillation ([place]) 09/2009   Breast cancer ([place])    Diverticulosis 09/2006   GERD (gastroesophageal reflux disease)  Hiatal hernia    Hyperlipidemia    Hypertension    Osteopenia    Postmenopausal hormone replacement therapy 1989 - 07/2000    Past Surgical History:  Procedure Laterality Date   BREAST DUCTAL SYSTEM EXCISION Left 06/2002   duct ectasia with fibrosis - atypical lobular hyperplasia with micro calcifications - tamoxifen X 28 months then change to Evista 10/06   BREAST LUMPECTOMY WITH RADIOACTIVE SEED LOCALIZATION Right  01/27/2020   Procedure: RIGHT BREAST LUMPECTOMY WITH RADIOACTIVE SEED LOCALIZATION;  Surgeon: Coralie Keens, MD;  Location: [facility];  Service: General;  Laterality: Right;   BREAST SURGERY Left 4/99   breast biopsy - fibrosis   CARDIOVERSION N/A 07/16/2017   Procedure: CARDIOVERSION;  Surgeon: Jerline Pain, MD;  Location: [facility];  Service: Cardiovascular;  Laterality: N/A;   CARDIOVERSION N/A 10/20/2018   Procedure: CARDIOVERSION;  Surgeon: Buford Dresser, MD;  Location: [facility];  Service: Cardiovascular;  Laterality: N/A;   CATARACT EXTRACTION W/ INTRAOCULAR LENS IMPLANT Right    CESAREAN SECTION     X 2   HYSTEROSCOPY  4/95   w/D&C   TEE WITHOUT CARDIOVERSION N/A 10/20/2018   Procedure: TRANSESOPHAGEAL ECHOCARDIOGRAM (TEE);  Surgeon: Buford Dresser, MD;  Location: [facility] ENDOSCOPY;  Service: Cardiovascular;  Laterality: N/A;    Current Medications: Current Meds  Medication Sig   acetaminophen (TYLENOL) 500 MG tablet Take 500-1,000 mg by mouth every 6 (six) hours as needed for headache or mild pain.   aspirin 81 MG chewable tablet Chew 81 mg by mouth daily.   Biotin 5000 MCG TABS Take 5,000 mcg by mouth daily.   CALCIUM 600 1500 (600 Ca) MG TABS tablet SMARTSIG:1 By Mouth   Calcium-Magnesium-Vitamin D (CALCIUM 1200+D3 PO) Take 1 tablet by mouth in the morning and at bedtime.   carvedilol (COREG) 6.25 MG tablet Take 1 tablet (6.25 mg total) by mouth 2 (two) times daily with a meal.   ELIQUIS 5 MG TABS tablet TAKE 1 TABLET BY MOUTH TWICE A DAY (Patient taking differently: Take 5 mg by mouth 2 (two) times daily.)   flecainide (TAMBOCOR) 150 MG tablet Take 1 tablet (150 mg total) by mouth 2 (two) times daily.   LINZESS 145 MCG CAPS capsule Take by mouth.   Multiple Vitamins-Minerals (CENTRUM SILVER 50+MEN) TABS SMARTSIG:1 By Mouth   Omega-3 Fatty Acids (FISH OIL) 1200 MG CAPS Take 1,200 mg by mouth in the morning and at bedtime.   Polyethyl Glycol-Propyl Glycol  (LUBRICANT EYE DROPS) 0.4-0.3 % SOLN Place 1 drop into both eyes 3 (three) times daily as needed (dry/irritated eyes).   PROLIA 60 MG/ML SOSY injection INJECT 60MG  SUBCUTANEOUSLY  INTO UPPER ARM, THIGH, OR  ABDOMEN EVERY 6 MONTHS  (GIVEN AT PRESCRIBERS  OFFICE)   simvastatin (ZOCOR) 20 MG tablet Take 20 mg by mouth every evening.    tretinoin (RETIN-A) 0.1 % cream Apply topically.   Vitamin D, Ergocalciferol, (DRISDOL) 1.25 MG (50000 UNIT) CAPS capsule Take by mouth.     Allergies:   Penicillins, Amlodipine, Atorvastatin, Crab [shellfish allergy], and Sulfa antibiotics   Social History   Socioeconomic History   Marital status: Widowed    Spouse name: Not on file   Number of children: 2   Years of education: Not on file   Highest education level: Not on file  Occupational History   Not on file  Tobacco Use   Smoking status: Former    Packs/day: 0.25    Years: 12.00    Pack years: 3.00  Types: Cigarettes    Start date: 01/16/1957    Quit date: 01/23/1968    Years since quitting: 53.1   Smokeless tobacco: Never  Vaping Use   Vaping Use: Never used  Substance and Sexual Activity   Alcohol use: Yes    Comment: social   Drug use: No   Sexual activity: Not Currently    Partners: Male    Birth control/protection: Post-menopausal  Other Topics Concern   Not on file  Social History Narrative   Not on file   Social Determinants of Health   Financial Resource Strain: Not on file  Food Insecurity: Not on file  Transportation Needs: Not on file  Physical Activity: Not on file  Stress: Not on file  Social Connections: Not on file     Family History: [name] family history includes Breast cancer in her maternal grandmother; COPD in her father; Diabetes in her maternal aunt, maternal aunt, maternal grandfather, and son; Heart disease in her father; Hypertension in her mother; Kidney cancer in her brother; Rheum arthritis in her brother.  ROS:   Please see the history of  present illness.    + mild nonpitting lower extremity edema + mild shortness of breath All other systems reviewed and are negative.  Labs/Other Studies Reviewed:    The following studies were reviewed today:  Echo 02/04/21  Left Ventricle: Left ventricular ejection fraction, by estimation, is 60  to 65%. The left ventricle has normal function. The left ventricle has no  regional wall motion abnormalities. The left ventricular internal cavity  size was normal in size. There is  mild asymmetric left ventricular hypertrophy of the basal-septal segment.  Left ventricular diastolic parameters are indeterminate.  Right Ventricle: The right ventricular size is normal. No increase in  right ventricular wall thickness. Right ventricular systolic function is  normal. There is normal pulmonary artery systolic pressure. The tricuspid  regurgitant velocity is 2.42 m/s, and   with an assumed right atrial pressure of 3 mmHg, the estimated right  ventricular systolic pressure is 83.4 mmHg.  Left Atrium: Left atrial size was moderately dilated.  Right Atrium: Right atrial size was normal in size.  Pericardium: There is no evidence of pericardial effusion.  Mitral Valve: The mitral valve is degenerative in appearance. There is  mild thickening of the mitral valve leaflet(s). There is mild  calcification of the mitral valve leaflet(s). Moderate to severe mitral  annular calcification. Trivial mitral valve regurgitation.  Tricuspid Valve: The tricuspid valve is normal in structure. Tricuspid  valve regurgitation is trivial.  Aortic Valve: The aortic valve is tricuspid. There is mild calcification  of the aortic valve. There is mild thickening of the aortic valve. Aortic  valve regurgitation is not visualized. Aortic valve  sclerosis/calcification is present, without any  evidence of aortic stenosis.  Pulmonic Valve: The pulmonic valve was normal in structure. Pulmonic valve  regurgitation is trivial.   Aorta: The aortic root and ascending aorta are structurally normal, with  no evidence of dilitation.  Venous: The inferior vena cava is normal in size with greater than 50%  respiratory variability, suggesting right atrial pressure of 3 mmHg.  IAS/Shunts: The atrial septum is grossly normal.   24 Hr BP monitor    Episodic spikes in BP over the course of 24 hours. Maximum systolic blood pressure 196 mmHg. 22% of systolic blood pressures greater than 140 mmHg. Diastolic blood pressures typically well controlled.  Recent Labs: 06/07/2020: NT-Pro BNP 309 11/03/2020: TSH 3.71  02/03/2021: ALT 15; Hemoglobin 13.8; Platelets 232 02/05/2021: BUN 27; Creatinine, Ser 1.16; Potassium 4.7; Sodium 140  Recent Lipid Panel No results found for: CHOL, TRIG, HDL, CHOLHDL, VLDL, LDLCALC, LDLDIRECT   Risk Assessment/Calculations:    CHA2DS2-VASc Score = 4 This indicates a 4.8% annual risk of stroke. [name] score is based upon: CHF History: 0 HTN History: 1 Diabetes History: 0 Stroke History: 0 Vascular Disease History: 0 Age Score: 2 Gender Score: 1    Physical Exam:    VS:  BP 138/68    Pulse 75    Ht 5\' 3"  (1.6 m)    Wt 133 lb (60.3 kg)    LMP 01/23/1988 (Approximate)    BMI 23.56 kg/m     Wt Readings from Last 3 Encounters:  03/07/21 133 lb (60.3 kg)  02/07/21 137 lb (62.1 kg)  02/03/21 133 lb 8 oz (60.6 kg)     GEN:  Well nourished, well developed in no acute distress HEENT: Normal NECK: No JVD; No carotid bruits CARDIAC: RRR, no murmurs, rubs, gallops RESPIRATORY:  Clear to auscultation without rales, wheezing or rhonchi  ABDOMEN: Soft, non-tender, non-distended MUSCULOSKELETAL:  Mild, non-pitting bilateral lower extremity edema; No deformity. 2+ pedal pulses, equal bilaterally SKIN: Warm and dry NEUROLOGIC:  Alert and oriented x 3 PSYCHIATRIC:  Normal affect   EKG:  EKG is not ordered today.    Diagnoses:    1. Paroxysmal atrial fibrillation (HCC)   2. Essential  hypertension   3. Mixed hyperlipidemia   4. Chronic anticoagulation   5. Long term current use of antiarrhythmic medical therapy   6. SOB (shortness of breath)   7. Aortic atherosclerosis ([place])   8. Itching    Assessment and Plan:     PAF on antiarrhythmic medication, on chronic anticoagulation: Maintaining sinus rhythm with heart rate well controlled.  No bleeding concerns on Eliquis.  Her weight is 60.3 kg putting her right on the cusp of requiring lower dose Eliquis. I will see her back in one month and will monitor weight closely.  She is questioning whether she started carvedilol as advised by Dr. Curt Bears on 02/07/2021.  She will double check pill bottles at home and if not already taking will start carvedilol 6.25 mg twice daily.  Continue flecainide, Eliquis.   Essential hypertension: Blood pressures well controlled today.  She is currently off all antihypertensives unless as noted above she is taking the carvedilol as prescribed. We will plan for 5 days of consistent blood pressure monitoring 1 time per day.  I encouraged her not to be alarmed by slightly elevated readings and that 5 days of consistent data will be helpful in determining plan moving forward. Would favor limiting antihypertensive therapy to carvedilol if BP remains well-controlled.   Hyperlipidemia/Aortic atherosclerosis: No recent cholesterol screening. Will plan for fasting lab work at next office visit. I am hesitant to make more than one adjustment at the time but would favor discontinuing simvastatin and having her start rosuvastatin or atorvastatin since there are less drug interactions with these medications.   Shortness of breath: She reports mild occasional shortness of breath that she feels is secondary to decreased physical activity since Covid pandemic. Reviewed echo results with her in detail. Systolic function is normal, diastolic function is indeterminate. No edema, no orthopnea, no PND. Advised her to increase  physical activity and report any concerns.   Itching: Has frequent itching with medications. Has seen dermatology and was told to use anti-itch cream. Advised her  to take Zyrtec as needed for allergy/itching to see if symptoms improve.   Disposition: 1 month f/u with APP     Medication Adjustments/Labs and Tests Ordered: Current medicines are reviewed at length with the patient today.  Concerns regarding medicines are outlined above.  No orders of the defined types were placed in this encounter.  Meds ordered this encounter  Medications   carvedilol (COREG) 6.25 MG tablet    Sig: Take 1 tablet (6.25 mg total) by mouth 2 (two) times daily with a meal.    Dispense:  180 tablet    Refill:  3    Patient Instructions  Medication Instructions:  Your physician has recommended you make the following change in your medication:   Please  restart CARVEDILOL 6.25mg  twice daily   Continue:  Eliquis, Flecainide, Simvastatin, and Aspirin!   You can take Over the Counter Zyrtec for itching!   For the next 5 days and record your blood pressure one time daily at least 2 hours after medication administration  in the morning and send [place] the readings on Monday 03/13/21    *If you need a refill on your cardiac medications before your next appointment, please call your pharmacy*   Lab Work: None ordered today   Testing/Procedures: None ordered today    Follow-Up: At [facility], you and your health needs are our priority.  As part of our continuing mission to provide you with exceptional heart care, we have created designated Provider Care Teams.  These Care Teams include your primary Cardiologist (physician) and Advanced Practice Providers (APPs -  Physician Assistants and Nurse Practitioners) who all work together to provide you with the care you need, when you need it.  We recommend signing up for the patient portal called "MyChart".  Sign up information is provided on this After Visit  Summary.  MyChart is used to connect with patients for Virtual Visits (Telemedicine).  Patients are able to view lab/test results, encounter notes, upcoming appointments, etc.  Non-urgent messages can be sent to your provider as well.   To learn more about what you can do with MyChart, go to [email or web address].    Your next appointment:   1 month(s)  The format for your next appointment:   In Person  Provider:   [name], NP     Signed, Emmaline Life, NP  03/07/2021 11:28 AM    [place]

## 2021-03-03 NOTE — Telephone Encounter (Signed)
Left message to call office

## 2021-03-03 NOTE — Telephone Encounter (Signed)
Pt c/o medication issue:  1. Name of Medication: hydralazine 10mg    2. How are you currently taking this medication (dosage and times per day)? Take 1 tablet (10 mg total) by mouth in the morning and at bedtime.  3. Are you having a reaction (difficulty breathing--STAT)? Burning internally and externally  4. What is your medication issue? Pt states that this medication has her burning up... please advise

## 2021-03-06 NOTE — Telephone Encounter (Signed)
I spoke with patient. She does not have a fever. Reports the blood vessels in her arms and legs turn red. This started over the summer but patient feels hydralazine aggravates this.  She took half dose hydralazine over the weekend and did not take today. She will hold hydralazine tomorrow if still having issues. Patient aware of appointment tomorrow at [facility] office

## 2021-03-07 ENCOUNTER — Encounter (HOSPITAL_BASED_OUTPATIENT_CLINIC_OR_DEPARTMENT_OTHER): Payer: Self-pay | Admitting: Nurse Practitioner

## 2021-03-07 ENCOUNTER — Ambulatory Visit (HOSPITAL_BASED_OUTPATIENT_CLINIC_OR_DEPARTMENT_OTHER): Payer: Medicare PPO | Admitting: Nurse Practitioner

## 2021-03-07 ENCOUNTER — Other Ambulatory Visit: Payer: Self-pay

## 2021-03-07 VITALS — BP 138/68 | HR 75 | Ht 63.0 in | Wt 133.0 lb

## 2021-03-07 DIAGNOSIS — I10 Essential (primary) hypertension: Secondary | ICD-10-CM

## 2021-03-07 DIAGNOSIS — L299 Pruritus, unspecified: Secondary | ICD-10-CM | POA: Diagnosis not present

## 2021-03-07 DIAGNOSIS — Z79899 Other long term (current) drug therapy: Secondary | ICD-10-CM

## 2021-03-07 DIAGNOSIS — I480 Paroxysmal atrial fibrillation: Secondary | ICD-10-CM | POA: Diagnosis not present

## 2021-03-07 DIAGNOSIS — I700 Atherosclerosis of aorta: Secondary | ICD-10-CM | POA: Diagnosis not present

## 2021-03-07 DIAGNOSIS — Z7901 Long term (current) use of anticoagulants: Secondary | ICD-10-CM

## 2021-03-07 DIAGNOSIS — E782 Mixed hyperlipidemia: Secondary | ICD-10-CM | POA: Diagnosis not present

## 2021-03-07 DIAGNOSIS — R0602 Shortness of breath: Secondary | ICD-10-CM

## 2021-03-07 MED ORDER — CARVEDILOL 6.25 MG PO TABS
6.2500 mg | ORAL_TABLET | Freq: Two times a day (BID) | ORAL | 3 refills | Status: DC
Start: 2021-03-07 — End: 2021-03-10

## 2021-03-07 NOTE — Patient Instructions (Addendum)
Medication Instructions:  Your physician has recommended you make the following change in your medication:   Please  restart CARVEDILOL 6.25mg  twice daily   Continue:  Eliquis, Flecainide, Simvastatin, and Aspirin!   You can take Over the Counter Zyrtec for itching!   For the next 5 days and record your blood pressure one time daily at least 2 hours after medication administration  in the morning and send [place] the readings on Monday 03/13/21    *If you need a refill on your cardiac medications before your next appointment, please call your pharmacy*   Lab Work: None ordered today   Testing/Procedures: None ordered today    Follow-Up: At [facility], you and your health needs are our priority.  As part of our continuing mission to provide you with exceptional heart care, we have created designated Provider Care Teams.  These Care Teams include your primary Cardiologist (physician) and Advanced Practice Providers (APPs -  Physician Assistants and Nurse Practitioners) who all work together to provide you with the care you need, when you need it.  We recommend signing up for the patient portal called "MyChart".  Sign up information is provided on this After Visit Summary.  MyChart is used to connect with patients for Virtual Visits (Telemedicine).  Patients are able to view lab/test results, encounter notes, upcoming appointments, etc.  Non-urgent messages can be sent to your provider as well.   To learn more about what you can do with MyChart, go to [email or web address].    Your next appointment:   1 month(s)  The format for your next appointment:   In Person  Provider:   [name], NP

## 2021-03-08 NOTE — Progress Notes (Signed)
81 y.o. G54P2001 Widowed White or Caucasian Not Hispanic or Latino female here for annual exam.   She has been having trouble with her blood pressure. She was told not to take her Toprol until tonight at bed time.   She is nervous being here, but feels fine, no headache, no pain.   H/O Afib, on eloquis.    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   No bowel c/o.   Living in a Condo. She is enjoying it.   Patient's last menstrual period was 01/23/1988 (approximate).          Sexually active: No.  The current method of family planning is post menopausal status.    Exercising: No.   Yoga and walks the dog  Smoker:  no  Health Maintenance: Pap:  04/12/15 neg              11/05/2008 Neg  History of abnormal Pap:  no MMG:  She reports mammogram in 12/2 at [place]. BMD:   05/03/20 low bone mass  Colonoscopy: 04/2017. Normal. Couldn't finish it secondary to her heart racing.  TDaP:  2016  Gardasil: n/a   reports that she quit smoking about 53 years ago. Her smoking use included cigarettes. She started smoking about 64 years ago. She has a 3.00 pack-year smoking history. She has never used smokeless tobacco. She reports current alcohol use. She reports that she does not use drugs. Son died a couple of years ago. Daughter is local. 5 grandchildren are local.   Past Medical History:  Diagnosis Date   Atrial fibrillation ([place]) 09/2009   Breast cancer ([place])    Diverticulosis 09/2006   GERD (gastroesophageal reflux disease)    Hiatal hernia    Hyperlipidemia    Hypertension    Osteopenia    Postmenopausal hormone replacement therapy 1989 - 07/2000    Past Surgical History:  Procedure Laterality Date   BREAST DUCTAL SYSTEM EXCISION Left 06/2002   duct ectasia with fibrosis - atypical lobular hyperplasia with micro calcifications - tamoxifen X 28 months then change to Evista 10/06   BREAST LUMPECTOMY WITH RADIOACTIVE SEED LOCALIZATION Right  01/27/2020   Procedure: RIGHT BREAST LUMPECTOMY WITH RADIOACTIVE SEED LOCALIZATION;  Surgeon: Coralie Keens, MD;  Location: [facility];  Service: General;  Laterality: Right;   BREAST SURGERY Left 4/99   breast biopsy - fibrosis   CARDIOVERSION N/A 07/16/2017   Procedure: CARDIOVERSION;  Surgeon: Jerline Pain, MD;  Location: [facility];  Service: Cardiovascular;  Laterality: N/A;   CARDIOVERSION N/A 10/20/2018   Procedure: CARDIOVERSION;  Surgeon: Buford Dresser, MD;  Location: [place];  Service: Cardiovascular;  Laterality: N/A;   CATARACT EXTRACTION W/ INTRAOCULAR LENS IMPLANT Right    CESAREAN SECTION     X 2   HYSTEROSCOPY  4/95   w/D&C   TEE WITHOUT CARDIOVERSION N/A 10/20/2018   Procedure: TRANSESOPHAGEAL ECHOCARDIOGRAM (TEE);  Surgeon: Buford Dresser, MD;  [facility] END[facility];  Service: Cardiovascular;  Laterality: N/A;    Current Outpatient Medications  Medication Sig Dispense Refill   acetaminophen (TYLENOL) 500 MG tablet Take 500-1,000 mg by mouth every 6 (six) hours as needed for headache or mild pain.     aspirin 81 MG chewable tablet Chew 81 mg by mouth daily.     Biotin 5000 MCG TABS Take 5,000 mcg by mouth daily.     CALCIUM 600 1500 (600 Ca) MG TABS tablet SMARTSIG:1 By Mouth  Calcium-Magnesium-Vitamin D (CALCIUM 1200+D3 PO) Take 1 tablet by mouth in the morning and at bedtime.     ELIQUIS 5 MG TABS tablet TAKE 1 TABLET BY MOUTH TWICE A DAY (Patient taking differently: Take 5 mg by mouth 2 (two) times daily.) 60 tablet 5   metoprolol succinate (TOPROL XL) 25 MG 24 hr tablet Take 0.5 tablets (12.5 mg total) by mouth at bedtime. 15 tablet 11   Multiple Vitamins-Minerals (CENTRUM SILVER 50+MEN) TABS SMARTSIG:1 By Mouth     Omega-3 Fatty Acids (FISH OIL) 1200 MG CAPS Take 1,200 mg by mouth in the morning and at bedtime.     tretinoin (RETIN-A) 0.1 % cream Apply topically.     Vitamin D, Ergocalciferol, (DRISDOL) 1.25 MG (50000 UNIT) CAPS capsule Take  by mouth.     LINZESS 145 MCG CAPS capsule Take by mouth. (Patient not taking: Reported on 03/16/2021)     simvastatin (ZOCOR) 20 MG tablet Take 20 mg by mouth every evening.  (Patient not taking: Reported on 03/16/2021)     No current facility-administered medications for this visit.    Family History  Problem Relation Age of Onset   Hypertension Mother    Heart disease Father    COPD Father    Diabetes Maternal Grandfather    Diabetes Son    Rheum arthritis Brother    Kidney cancer Brother    Breast cancer Maternal Grandmother    Diabetes Maternal Aunt    Diabetes Maternal Aunt     Review of Systems  All other systems reviewed and are negative.  Exam:   BP (!) 210/100    Pulse 96    Wt 136 lb (61.7 kg)    LMP 01/23/1988 (Approximate)    SpO2 97%    BMI 24.09 kg/m   Weight change: @WEIGHTCHANGE @ Height:      Ht Readings from Last 3 Encounters:  03/07/21 5\' 3"  (1.6 m)  02/07/21 5\' 4"  (1.626 m)  02/03/21 5\' 4"  (1.626 m)    General appearance: alert, cooperative and appears stated age Head: Normocephalic, without obvious abnormality, atraumatic Neck: no adenopathy, supple, symmetrical, trachea midline and thyroid  left lobe > right Breasts: normal appearance, no masses or tenderness Abdomen: soft, non-tender; non distended,  no masses,  no organomegaly Extremities: extremities normal, atraumatic, no cyanosis or edema Skin: Skin color, texture, turgor normal. No rashes or lesions Lymph nodes: Cervical, supraclavicular, and axillary nodes normal. No abnormal inguinal nodes palpated Neurologic: Grossly normal   Pelvic: External genitalia:  no lesions              Urethra:  normal appearing urethra with no masses, tenderness or lesions              Bartholins and Skenes: normal                 Vagina: atrophic appearing vagina with normal color and discharge, no lesions              Cervix: no lesions               Bimanual Exam:  Uterus:  normal size, contour, position,  consistency, mobility, non-tender              Adnexa: no mass, fullness, tenderness               Rectovaginal: Confirms               Anus:  normal sphincter tone, no lesions  Gae Dry chaperoned for the exam.   1. Gynecologic exam normal Per patient mammogram and colon cancer screening are UTD No pap needed.   2. Hypertension, unspecified type Initial BP 210/100, repeat BP 170/98.  Reached out to Cardiology, they will reach out to the patient. They did not feel she needed to be seen urgently. I advised the patient to check her BP at home and call Cardiology again if it was still high  3. Thyroid lump - US SOFT TISSUE HEAD & NECK (NON-THYROID); Future

## 2021-03-10 ENCOUNTER — Other Ambulatory Visit (HOSPITAL_BASED_OUTPATIENT_CLINIC_OR_DEPARTMENT_OTHER): Payer: Self-pay | Admitting: Nurse Practitioner

## 2021-03-10 MED ORDER — METOPROLOL SUCCINATE ER 25 MG PO TB24: 25.0000 mg | ORAL_TABLET | Freq: Every day | ORAL | 11 refills | Status: DC

## 2021-03-13 ENCOUNTER — Encounter (HOSPITAL_BASED_OUTPATIENT_CLINIC_OR_DEPARTMENT_OTHER): Payer: Self-pay

## 2021-03-15 ENCOUNTER — Telehealth (HOSPITAL_BASED_OUTPATIENT_CLINIC_OR_DEPARTMENT_OTHER): Payer: Self-pay

## 2021-03-15 MED ORDER — METOPROLOL SUCCINATE ER 25 MG PO TB24: 12.5000 mg | ORAL_TABLET | Freq: Every day | ORAL | 11 refills | Status: DC

## 2021-03-15 NOTE — Telephone Encounter (Signed)
Called patient to get more information on how patient is feeling today after receiving her blood pressure log    Patient has been on Metoprolol for 5 days and is not tolerating it.   Here are my blood pressure readings from last 6 days: Please see today- it is worrisome.   today: 77/54 / 73 pulse (12 pm, heartbeat very strong, scary, dizzy, hard to breathe, hard to keep eyes open- patient is not feeling this way on the phone currently. No chest pain!  Feb 21: 100/42 / 53 pulse (am extremely dizzy) Feb 20: 152/56 / 55 pulse (11:30 pm stinging arms and ankles) Feb 19: 146/65 / 49 pulse (3pm - headache, dizzy) Feb 18: 135/50 / 52 pulse (bedtime) Feb 17: 121/55  /61 pulse (bedtime)   Patient very frustrated that she has been having issues since august and seems to not be finding a blood pressure medication that works for her. RN explained to patient that blood pressure can be a tricky thing and often people have to trial a few medications before finding the right one!

## 2021-03-15 NOTE — Telephone Encounter (Signed)
RN called patient back with Laurann Montana, NP recommendation   BP while on the phone is 145/60. Patient said this is normal for her! Patient is going to hold Metoprolol for tonight and resume taking tomorrow at 12.5mg . Patient is going to record her blood pressure one time daily around the same time and call us with an update on Friday!          "Please ask patient to recheck blood pressure.  If systolic blood pressure less than 100 and still feeling lightheaded, recommend eating something with extra salt and drinking at least 16 oz of fluid then recheck BP in 30 minutes. If still symptomatic, recommend evaluation in the emergency department.  If improved with systolic >633, okay to monitor at home. Recommend eating and drinking something. Check BP once per day at around the same time. Hold Metoprolol tonight. Resume tomorrow evening with half tablet (12.5mg ) daily.   [place], [place] in EP ensure Metoprolol Succinate 12.5mg  is adequate dosing with Flecainide.    Loel Dubonnet, NP"

## 2021-03-15 NOTE — Telephone Encounter (Signed)
Please ask patient to recheck blood pressure.  If systolic blood pressure less than 100 and still feeling lightheaded, recommend eating something with extra salt and drinking at least 16 oz of fluid then recheck BP in 30 minutes. If still symptomatic, recommend evaluation in the emergency department.  If improved with systolic >349, okay to monitor at home. Recommend eating and drinking something. Check BP once per day at around the same time. Hold Metoprolol tonight. Resume tomorrow evening with half tablet (12.5mg ) daily.  [place], [place] in EP ensure Metoprolol Succinate 12.5mg  is adequate dosing with Flecainide.   Loel Dubonnet, NP

## 2021-03-15 NOTE — Telephone Encounter (Signed)
See duplicate MyChart encounter 03/13/21 for recommendations. Patient is being contacted by Daphene Jaeger, RN.  Loel Dubonnet, NP

## 2021-03-15 NOTE — Telephone Encounter (Signed)
See phone note 03/15/21. BP was improved. Metoprolol reduced to 12.5mg  QD.   Loel Dubonnet, NP

## 2021-03-16 ENCOUNTER — Ambulatory Visit (INDEPENDENT_AMBULATORY_CARE_PROVIDER_SITE_OTHER): Payer: Medicare PPO | Admitting: Obstetrics and Gynecology

## 2021-03-16 ENCOUNTER — Telehealth: Payer: Self-pay | Admitting: *Deleted

## 2021-03-16 ENCOUNTER — Encounter: Payer: Self-pay | Admitting: Obstetrics and Gynecology

## 2021-03-16 ENCOUNTER — Telehealth: Payer: Self-pay | Admitting: Interventional Cardiology

## 2021-03-16 ENCOUNTER — Other Ambulatory Visit: Payer: Self-pay

## 2021-03-16 VITALS — BP 210/100 | HR 96 | Wt 136.0 lb

## 2021-03-16 DIAGNOSIS — I10 Essential (primary) hypertension: Secondary | ICD-10-CM

## 2021-03-16 DIAGNOSIS — Z01419 Encounter for gynecological examination (general) (routine) without abnormal findings: Secondary | ICD-10-CM | POA: Diagnosis not present

## 2021-03-16 DIAGNOSIS — E079 Disorder of thyroid, unspecified: Secondary | ICD-10-CM

## 2021-03-16 DIAGNOSIS — Z9289 Personal history of other medical treatment: Secondary | ICD-10-CM

## 2021-03-16 DIAGNOSIS — K5792 Diverticulitis of intestine, part unspecified, without perforation or abscess without bleeding: Secondary | ICD-10-CM | POA: Insufficient documentation

## 2021-03-16 DIAGNOSIS — D0590 Unspecified type of carcinoma in situ of unspecified breast: Secondary | ICD-10-CM | POA: Insufficient documentation

## 2021-03-16 DIAGNOSIS — G5600 Carpal tunnel syndrome, unspecified upper limb: Secondary | ICD-10-CM | POA: Insufficient documentation

## 2021-03-16 DIAGNOSIS — R202 Paresthesia of skin: Secondary | ICD-10-CM | POA: Insufficient documentation

## 2021-03-16 DIAGNOSIS — R630 Anorexia: Secondary | ICD-10-CM | POA: Insufficient documentation

## 2021-03-16 DIAGNOSIS — K219 Gastro-esophageal reflux disease without esophagitis: Secondary | ICD-10-CM | POA: Insufficient documentation

## 2021-03-16 DIAGNOSIS — R739 Hyperglycemia, unspecified: Secondary | ICD-10-CM | POA: Insufficient documentation

## 2021-03-16 DIAGNOSIS — D239 Other benign neoplasm of skin, unspecified: Secondary | ICD-10-CM | POA: Insufficient documentation

## 2021-03-16 DIAGNOSIS — R7303 Prediabetes: Secondary | ICD-10-CM | POA: Insufficient documentation

## 2021-03-16 DIAGNOSIS — Z9189 Other specified personal risk factors, not elsewhere classified: Secondary | ICD-10-CM

## 2021-03-16 DIAGNOSIS — L659 Nonscarring hair loss, unspecified: Secondary | ICD-10-CM | POA: Insufficient documentation

## 2021-03-16 NOTE — Telephone Encounter (Signed)
Left a message for pt to call back

## 2021-03-16 NOTE — Telephone Encounter (Signed)
Received Epic secure chat from [facility]. Patient BP in their office initially BP 210/100, repeat BP 170/98 without intervention. Known labile BP with anxiety contributory.   Has been evaluated by Dr. Irish Lack as well as pharmacy team. Most recent visit 03/07/21 with [name], NP with BP 138/68 on no antihypertensive agents. She was recommended to resume Carvedilol due to requiring AV nodal blocking agent on Flecainide. Subsequently reported dizziness, headache with BP 124/61 and was transitioned to Toprol 25mg  QD. She contacted our office yesterday noting BP 77/54 associated with dizziness, dyspnea and BP improved to 145/60 while on phone with RN. She was recommended to reduce Toprol to 12.5mg  QD.  Recommend sooner appointment with Dr. Irish Lack or [name], NP. Recommend patient check her BP only once per day after resting for 10 minutes and keep a log. If SBP <100 or >150 she may recheck in 30 minutes.   Loel Dubonnet, NP

## 2021-03-16 NOTE — Telephone Encounter (Signed)
Patient returning call.

## 2021-03-16 NOTE — Progress Notes (Signed)
Spoke with Delsa Sale at [employer], spoke with Delsa Sale. Advised patient in office, B/P initially 210/100, 170/98 on recheck, denies any other symptoms. Was advised no appt availability today, Delsa Sale will forward message to their clinical team will f/u. Their office will contact patient directly. Patient notified of plan and is agreeable.

## 2021-03-16 NOTE — Telephone Encounter (Signed)
Patient notified.  Appointment made for patient to see Dr Irish Lack on March 23, 2021 at 2:20

## 2021-03-16 NOTE — Patient Instructions (Signed)

## 2021-03-16 NOTE — Telephone Encounter (Signed)
Pt c/o BP issue: STAT if pt c/o blurred vision, one-sided weakness or slurred speech  1. What are your last 5 BP readings?  210/100 170/98  2. Are you having any other symptoms (ex. Dizziness, headache, blurred vision, passed out)? No   3. What is your BP issue? Patient is at her OB/GYN office. The OB/GYN wanted to have the patient seen to be evaluated for her BP

## 2021-03-16 NOTE — Telephone Encounter (Signed)
-----   Message from Salvadore Dom, MD sent at 03/16/2021 11:59 AM EST ----- I've put in an order for a thyroid ultrasound. Please make sure she gets scheduled. Thanks, Sharee Pimple

## 2021-03-17 NOTE — Telephone Encounter (Signed)
Patient scheduled on 04/02/21 for thyroid ultrasound.

## 2021-03-22 ENCOUNTER — Other Ambulatory Visit: Payer: Medicare PPO

## 2021-03-23 ENCOUNTER — Other Ambulatory Visit: Payer: Self-pay

## 2021-03-23 ENCOUNTER — Ambulatory Visit: Payer: Medicare PPO | Admitting: Interventional Cardiology

## 2021-03-23 ENCOUNTER — Encounter: Payer: Self-pay | Admitting: Interventional Cardiology

## 2021-03-23 VITALS — BP 126/62 | HR 62 | Ht 63.5 in | Wt 134.0 lb

## 2021-03-23 DIAGNOSIS — E782 Mixed hyperlipidemia: Secondary | ICD-10-CM

## 2021-03-23 DIAGNOSIS — R0989 Other specified symptoms and signs involving the circulatory and respiratory systems: Secondary | ICD-10-CM | POA: Diagnosis not present

## 2021-03-23 DIAGNOSIS — R0602 Shortness of breath: Secondary | ICD-10-CM | POA: Diagnosis not present

## 2021-03-23 DIAGNOSIS — I700 Atherosclerosis of aorta: Secondary | ICD-10-CM

## 2021-03-23 DIAGNOSIS — Z7901 Long term (current) use of anticoagulants: Secondary | ICD-10-CM | POA: Diagnosis not present

## 2021-03-23 DIAGNOSIS — I10 Essential (primary) hypertension: Secondary | ICD-10-CM

## 2021-03-23 DIAGNOSIS — I480 Paroxysmal atrial fibrillation: Secondary | ICD-10-CM | POA: Diagnosis not present

## 2021-03-23 NOTE — Patient Instructions (Signed)
Medication Instructions:  ?Your physician recommends that you continue on your current medications as directed. Please refer to the Current Medication list given to you today. ? ?*If you need a refill on your cardiac medications before your next appointment, please call your pharmacy* ? ? ?Lab Work: ?Lab work to be done today--BMP and BNP ?If you have labs (blood work) drawn today and your tests are completely normal, you will receive your results only by: ?MyChart Message (if you have MyChart) OR ?A paper copy in the mail ?If you have any lab test that is abnormal or we need to change your treatment, we will call you to review the results. ? ? ?Testing/Procedures: ?Have chest X- ray done at [facility] in the [place] ? ? ?Follow-Up: ?At [facility], you and your health needs are our priority.  As part of our continuing mission to provide you with exceptional heart care, we have created designated Provider Care Teams.  These Care Teams include your primary Cardiologist (physician) and Advanced Practice Providers (APPs -  Physician Assistants and Nurse Practitioners) who all work together to provide you with the care you need, when you need it. ? ?We recommend signing up for the patient portal called "MyChart".  Sign up information is provided on this After Visit Summary.  MyChart is used to connect with patients for Virtual Visits (Telemedicine).  Patients are able to view lab/test results, encounter notes, upcoming appointments, etc.  Non-urgent messages can be sent to your provider as well.   ?To learn more about what you can do with MyChart, go to [email or web address].   ? ?Your next appointment:   ?April 13, 2021 ? ?The format for your next appointment:   ?In Person ? ?Provider:   ?Lorenda Peck ? ?If primary card or EP is not listed click here to update    :1}  ? ? ?Other Instructions ? ? ?

## 2021-03-23 NOTE — Progress Notes (Signed)
Cardiology Office Note   Date:  03/23/2021   ID:  [name], DOB Mar 22, 1940, MRN [id number]  PCP:  Mathews Shanks, MD    Chief Complaint  Patient presents with   Follow-up    Discuss blood pressure   PAF  Wt Readings from Last 3 Encounters:  03/23/21 134 lb (60.8 kg)  03/16/21 136 lb (61.7 kg)  03/07/21 133 lb (60.3 kg)       History of Present Illness: Mathews Mathews is a 81 y.o. female    Who has AFib, maintaining NSR on flecainide.  We tried a lower dose of flecainide in the past but she had SHOB, so resumed the current dose.    Her husband died in 06-28-15.  She has adjusted well. She moved and enjoys her downsized lifestyle.   She had a fall in 5/18 and hit her face and shoulder.  Head CT was negative.  She has a dislocated right shoulder.  It is being managed conservatively.  She did PT.   In June 2019, she went out of rhythm and had a cardioversion.     She saw Dr. Curt Bears in 2019 and medical therapy was continued.  She has followed with EP since then.   Since the last visit, she has had a lot of stress with her son's illness.  He has an autoimmune illness related to a flu shot 8 years ago.  He went on dialysis, had an amputation.  He worked as a Marine scientist, and has 3 kids.  He died in 12-03-2022.    She is vaccinated against COVID-19.  She has had issues with arthritic pain in her left neck.  In 2022, she developed a skin condition that causes severe rash.  It was thought to be related to amlodipine.  She was followed with dermatology.  She has had difficult to control blood pressure ever since.  She has tried multiple medications and has been followed in our Pharm.D. hypertension clinic.  We have retried amlodipine and her skin rash reappeared.  she presented to the hospital on 02/03/2021 with hypertensive urgency and a severe left-sided headache. Stroke work-up was negative. In the ED, she was hypertensive up to 220s/80s and had become hypotensive down to 80s/30s with  bradycardia after a repeat dose of hydralazine 5 mg IV. She had received 10 mg IV about 4 hours prior. Labetalol 5 mg q6h prn for SBP >180 and/or DBP >110 was added and pt was discharged home normotensive. Noted to have a systolic murmur on exam, and echo revealed an LVEF of 60-65%   On the 16th, pt reported BP remained elevated. She was advised to take an additional 75 mg of irbesartan if SBP > 150. Made patient aware that after 1 hour of additional dose if her SBP is still > 150, then she may take an additional 75 mg of irbesartan. Made patient aware that the max daily dose of irbesartan was 300 mg. She verbalized understanding.    At follow up visit on 1/17 in the Pharm.D. hypertension clinic, patient's blood pressure remained elevated, and irbesartan dose was increased to 150 mg daily and initiated on carvedilol 6.25mg  BID. Referred to PharmD for f/u.   Pt sent in a message on 1/18 reporting a headache and BP readings ranging from 98-182/49-80s over the span of last 8 hours. She was advised to take an extra irbesartan 150mg  dose. She planned to bring BP readings and cuff to review with pharmacist.  Due to labile blood pressure, would tolerate a goal of less than 140/90.  Irbesartan was stopped in January 2023.  Carvedilol 6.25 twice daily was continued.  If her systolic was less than 440, she would skip the dose of carvedilol.    Ultimately was changed to She came today with a list of BP saying that she is taking a half pill of metoprolol.  BPs at home in the last week have been between 102-725 systolic.   Past Medical History:  Diagnosis Date   Atrial fibrillation ([place]) 09/2009   Breast cancer ([place])    Diverticulosis 09/2006   GERD (gastroesophageal reflux disease)    Hiatal hernia    Hyperlipidemia    Hypertension    Osteopenia    Postmenopausal hormone replacement therapy 1989 - 07/2000    Past Surgical History:  Procedure Laterality Date   BREAST DUCTAL SYSTEM EXCISION Left 06/2002    duct ectasia with fibrosis - atypical lobular hyperplasia with micro calcifications - tamoxifen X 28 months then change to Evista 10/06   BREAST LUMPECTOMY WITH RADIOACTIVE SEED LOCALIZATION Right 01/27/2020   Procedure: RIGHT BREAST LUMPECTOMY WITH RADIOACTIVE SEED LOCALIZATION;  Surgeon: Mathews Keens, MD;  Location: [facility];  Service: General;  Laterality: Right;   BREAST SURGERY Left 4/99   breast biopsy - fibrosis   CARDIOVERSION N/A 07/16/2017   Procedure: CARDIOVERSION;  Surgeon: Jerline Pain, MD;  Location: [facility];  Service: Cardiovascular;  Laterality: N/A;   CARDIOVERSION N/A 10/20/2018   Procedure: CARDIOVERSION;  Surgeon: Buford Dresser, MD;  Location: [facility];  Service: Cardiovascular;  Laterality: N/A;   CATARACT EXTRACTION W/ INTRAOCULAR LENS IMPLANT Right    CESAREAN SECTION     X 2   HYSTEROSCOPY  4/95   w/D&C   TEE WITHOUT CARDIOVERSION N/A 10/20/2018   Procedure: TRANSESOPHAGEAL ECHOCARDIOGRAM (TEE);  Surgeon: Buford Dresser, MD;  Location: [facility];  Service: Cardiovascular;  Laterality: N/A;     Current Outpatient Medications  Medication Sig Dispense Refill   acetaminophen (TYLENOL) 500 MG tablet Take 500-1,000 mg by mouth every 6 (six) hours as needed for headache or mild pain.     aspirin 81 MG chewable tablet Chew 81 mg by mouth daily.     Biotin 5000 MCG TABS Take 5,000 mcg by mouth daily.     CALCIUM 600 1500 (600 Ca) MG TABS tablet SMARTSIG:1 By Mouth     Calcium-Magnesium-Vitamin D (CALCIUM 1200+D3 PO) Take 1 tablet by mouth in the morning and at bedtime.     ELIQUIS 5 MG TABS tablet TAKE 1 TABLET BY MOUTH TWICE A DAY 60 tablet 5   LINZESS 145 MCG CAPS capsule Take by mouth.     metoprolol succinate (TOPROL XL) 25 MG 24 hr tablet Take 0.5 tablets (12.5 mg total) by mouth at bedtime. 15 tablet 11   Multiple Vitamins-Minerals (CENTRUM SILVER 50+MEN) TABS SMARTSIG:1 By Mouth     Omega-3 Fatty Acids (FISH OIL) 1200 MG CAPS Take  1,200 mg by mouth in the morning and at bedtime.     simvastatin (ZOCOR) 20 MG tablet Take 20 mg by mouth every evening.     No current facility-administered medications for this visit.    Allergies:   Atorvastatin, Crab [shellfish allergy], Penicillins, Amlodipine, and Sulfa antibiotics    Social History:  The patient  reports that she quit smoking about 53 years ago. Her smoking use included cigarettes. She started smoking about 64 years ago. She has a 3.00 pack-year smoking history.  She has never used smokeless tobacco. She reports current alcohol use. She reports that she does not use drugs.   Family History:  [name] family history includes Breast cancer in her maternal grandmother; COPD in her father; Diabetes in her maternal aunt, maternal aunt, maternal grandfather, and son; Heart disease in her father; Hypertension in her mother; Kidney cancer in her brother; Rheum arthritis in her brother.    ROS:  Please see the history of present illness.   Otherwise, review of systems are positive for labile BP-improving.   All other systems are reviewed and negative.    PHYSICAL EXAM: VS:  BP 126/62    Pulse 62    Ht 5' 3.5" (1.613 m)    Wt 134 lb (60.8 kg)    LMP 01/23/1988 (Approximate)    SpO2 97%    BMI 23.36 kg/m  , BMI Body mass index is 23.36 kg/m. GEN: Well nourished, well developed, in no acute distress HEENT: normal Neck: no JVD, carotid bruits, or masses Cardiac: RRR; no murmurs, rubs, or gallops,no edema  Respiratory:  clear to auscultation bilaterally, normal work of breathing GI: soft, nontender, nondistended, + BS MS: no deformity or atrophy Skin: warm and dry, no rash Neuro:  Strength and sensation are intact Psych: euthymic mood, full affect   Recent Labs: 06/07/2020: NT-Pro BNP 309 11/03/2020: TSH 3.71 02/03/2021: ALT 15; Hemoglobin 13.8; Platelets 232 02/05/2021: BUN 27; Creatinine, Ser 1.16; Potassium 4.7; Sodium 140   Lipid Panel No results found for:  CHOL, TRIG, HDL, CHOLHDL, VLDL, LDLCALC, LDLDIRECT   Other studies Reviewed: Additional studies/ records that were reviewed today with results demonstrating: labs reviewed.   ASSESSMENT AND PLAN:  PAF: Flecainide to maintain sinus rhythm.  Maintaining NSR.  Acquired thrombophilia: Anticoagulated to prevent stroke in the setting of atrial fibrillation. Labile hypertension: Using metoprolol at this point.  Amlodipine is not an option although it worked well for many years.  Home readings are much improved.  Aortic atherosclerosis: Continue simvastatin.   DOE: Check chest xray.  BMet and BNP today.  Prior abdominal CT showed lower lung fields were clear.   Current medicines are reviewed at length with the patient today.  The patient concerns regarding her medicines were addressed.  The following changes have been made:  No change  Labs/ tests ordered today include:  No orders of the defined types were placed in this encounter.   Recommend 150 minutes/week of aerobic exercise Low fat, low carb, high fiber diet recommended  Disposition:   FU in 3 weeks with APP   Signed, Larae Grooms, MD  03/23/2021 2:47 PM    [facility] [place], [place], [place]  [address] Phone: [phone]; Fax: [phone]

## 2021-03-24 ENCOUNTER — Telehealth: Payer: Self-pay | Admitting: *Deleted

## 2021-03-24 LAB — BASIC METABOLIC PANEL
BUN/Creatinine Ratio: 27 (ref 12–28)
BUN: 22 mg/dL (ref 8–27)
CO2: 26 mmol/L (ref 20–29)
Calcium: 9.1 mg/dL (ref 8.7–10.3)
Chloride: 99 mmol/L (ref 96–106)
Creatinine, Ser: 0.83 mg/dL (ref 0.57–1.00)
Glucose: 147 mg/dL — ABNORMAL HIGH (ref 70–99)
Potassium: 4.8 mmol/L (ref 3.5–5.2)
Sodium: 141 mmol/L (ref 134–144)
eGFR: 70 mL/min/{1.73_m2} (ref >59)

## 2021-03-24 LAB — PRO B NATRIURETIC PEPTIDE: NT-Pro BNP: 1089 pg/mL — ABNORMAL HIGH (ref 0–738)

## 2021-03-24 MED ORDER — FUROSEMIDE 20 MG PO TABS: 20.0000 mg | ORAL_TABLET | Freq: Every day | ORAL | 6 refills | Status: DC

## 2021-03-24 NOTE — Telephone Encounter (Signed)
Continue metoprolol since this is not a new symptom. COntinue to monitor.  ?

## 2021-03-24 NOTE — Telephone Encounter (Signed)
Flecainide 150 mg PO BID added back to pt's medication list. ? ?Will await further instruction regarding Metoprolol -  ?Refer to dermatology? ?Continue or d/c metoprolol? ?Change to something else?  ?

## 2021-03-24 NOTE — Telephone Encounter (Signed)
She's mentioned burning on her skin that she's attributed to multiple other BP meds she's been tried on in the past month as well (spironolactone, hydralazine, and carvedilol), I'm not convinced that the same symptoms are coming from 4 different BP medications. She may need to follow up with dermatology again. ?

## 2021-03-24 NOTE — Telephone Encounter (Signed)
Spoke with the patient and advised her to continue on her current medications.  ?

## 2021-03-24 NOTE — Telephone Encounter (Signed)
Spoke with pt and reviewed results of recent lab and new order to start Furosemide 20 mg daily.  RX to be sent into [facility] at her request.   ? ?**Pt c/o having itching/red eyes and a burning feeling in her skin since starting Metoprolol 25 mg 1/2 tablet daily.  This has been progressively worsening to where the burning is waking her during the night.  Advised I will send this information to Dr Irish Lack for his review.   ? ?Pt also reporting Flecainide 150 mg twice daily was not on her AVS yesterday after her appt with Dr Irish Lack.  She is asking if she is to continue taking this. ? ?She is aware she will be contacted once this information has been reviewed by MD with further instructions/orders. ?

## 2021-03-27 ENCOUNTER — Telehealth: Payer: Self-pay | Admitting: *Deleted

## 2021-03-27 NOTE — Telephone Encounter (Signed)
I spoke with patient.  She started lasix yesterday.  States since starting it her shortness of breath is worse than before.  Took furosemide again today and feels shortness of breath is worse.  Tried to take her dog for a walk today and was short of breath.  No increased urine output but does state she always urinates frequently.  ?

## 2021-03-27 NOTE — Telephone Encounter (Signed)
I would increase lasix to 40 mg daily and see if [facility] improves.  ?Just take for 3 days.  ? ?JV  ? ?I spoke with patient and gave her message from Dr Irish Lack ?

## 2021-03-27 NOTE — Telephone Encounter (Signed)
I placed call to patient to see how her breathing is after starting furosemide (see lab results).  Left message to call office ?

## 2021-03-28 ENCOUNTER — Ambulatory Visit
Admission: RE | Admit: 2021-03-28 | Discharge: 2021-03-28 | Disposition: A | Discharge status: Home / Self Care | Payer: Medicare PPO | Source: Ambulatory Visit | Attending: Obstetrics and Gynecology | Admitting: Obstetrics and Gynecology

## 2021-03-28 ENCOUNTER — Ambulatory Visit
Admission: RE | Admit: 2021-03-28 | Discharge: 2021-03-28 | Disposition: A | Discharge status: Home / Self Care | Payer: Medicare PPO | Source: Ambulatory Visit | Attending: Interventional Cardiology | Admitting: Interventional Cardiology

## 2021-03-28 DIAGNOSIS — I480 Paroxysmal atrial fibrillation: Secondary | ICD-10-CM

## 2021-03-28 DIAGNOSIS — E079 Disorder of thyroid, unspecified: Secondary | ICD-10-CM

## 2021-03-28 DIAGNOSIS — R0602 Shortness of breath: Secondary | ICD-10-CM | POA: Diagnosis not present

## 2021-03-28 DIAGNOSIS — E041 Nontoxic single thyroid nodule: Secondary | ICD-10-CM | POA: Diagnosis not present

## 2021-03-29 DIAGNOSIS — J189 Pneumonia, unspecified organism: Secondary | ICD-10-CM | POA: Diagnosis not present

## 2021-03-30 NOTE — Progress Notes (Unsigned)
Cardiology Office Note:    Date:  03/30/2021   ID:  Anna Mathews, DOB January 09, 1941, MRN [id number]  PCP:  Vernie Shanks, MD   [facility] Providers Cardiologist:  Larae Grooms, MD Electrophysiologist:  Will Meredith Leeds, MD     Referring MD: Vernie Shanks, MD   Chief Complaint: follow-up hypertension  History of Present Illness:    Anna Mathews is a 81 y.o. female with a hx of PAF, atypical atrial flutter on chronic anticoagulation, HTN, TIA, hyperlipidemia, labile BP, and aortic atherosclerosis.   She presented to [facility] ED on 02/03/21 for evaluation of left-sided headache and home SBP > 200 mmHg. She took an extra dose of irbesartan as advised by cardiology for SBP > 150 mmHg. She took amlodipine and quinapril for about 15 years and then in September 2022, she started having waxing and waning hot and tingly episodes associated with rash. 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 She stopped quinapril and amlodipine and rash improved slightly, then returned. She took chlorthalidone for a short time but developed dizziness. In the ED on 1/13, BP initially 220s over 80s  and then became hypotensive at 80s over 30s and bradycardic with a 5 mg dose of IV hydralazine and she was admitted. BP improved and work-up was unremarkable and she was d/c'ed on 02/05/21.   She was last seen in our office 02/09/21 by Fuller Canada, [facility] for management of BP. She was given BP goal of < 130-140/80-90 mmHg, and advised to d/c irbesartan 150 mg, continue carvedilol 6.25 every 12 hours with permission to hold dose if SBP < 100 mmHg. She called back a few days later with concerns about feeling weak and continued BP fluctuations. 24 hour BP monitor revealed episodic spikes with max SBP 177 mmHg, 57% > 124 mmHg, diastolic range WNL. She took spironolactone on a sliding scale up to 25 mg but continued to have big swings in BP. On 02/28/21 she was advised by Dr.  Irish Lack and Fuller Canada, [facility] to stop spironolactone and start hydralazine 10 mg BID.  She called our office on 2/10 with reports of redness on her arms and legs and a burning sensation.  She had decreased her dose of hydralazine for a few days and was advised on 2/13 to hold hydralazine and follow-up at office visit today.  Today, she is here alone.  She reports the redness and stinging have resolved.  She initially started to have symptoms of redness in her ankles approximately 6 months ago as noted above and since stopping amlodipine it will still occur randomly, usually with the initiation of new medication.  She is intolerant of multiple antihypertensives. She was advised by her dermatologist to treat purpura with anti-itch lotion.  We reviewed medications and she is unclear as to whether she ever started carvedilol as advised by Dr. Curt Bears.  She is quite anxious about her blood pressure and monitors at least 3 times daily. She denies chest pain, shortness of breath, fatigue, palpitations, melena, hematuria, hemoptysis, diaphoresis, weakness, presyncope, syncope, orthopnea, and PND. She has mild non-pitting bilateral lower extremity edema in both lower extremities. Has been walking her dog on a consistent basis but was more active prior to Covid pandemic. She plans to increase walking for exercise and yoga.   She was recently seen by Dr. Irish Lack on 03/23/21   Past Medical History:  Diagnosis Date   Atrial fibrillation ([place]) 09/2009   Breast cancer ([place])  Diverticulosis 09/2006   GERD (gastroesophageal reflux disease)    Hiatal hernia    Hyperlipidemia    Hypertension    Osteopenia    Postmenopausal hormone replacement therapy 1989 - 07/2000    Past Surgical History:  Procedure Laterality Date   BREAST DUCTAL SYSTEM EXCISION Left 06/2002   duct ectasia with fibrosis - atypical lobular hyperplasia with micro calcifications - tamoxifen X 28 months then change to Evista 10/06   BREAST  LUMPECTOMY WITH RADIOACTIVE SEED LOCALIZATION Right 01/27/2020   Procedure: RIGHT BREAST LUMPECTOMY WITH RADIOACTIVE SEED LOCALIZATION;  Surgeon: Coralie Keens, MD;  Location: [place];  Service: General;  Laterality: Right;   BREAST SURGERY Left 4/99   breast biopsy - fibrosis   CARDIOVERSION N/A 07/16/2017   Procedure: CARDIOVERSION;  Surgeon: Jerline Pain, MD;  Location: [place] [facility];  Service: Cardiovascular;  Laterality: N/A;   CARDIOVERSION N/A 10/20/2018   Procedure: CARDIOVERSION;  Surgeon: Buford Dresser, MD;  Location: [place];  Service: Cardiovascular;  Laterality: N/A;   CATARACT EXTRACTION W/ INTRAOCULAR LENS IMPLANT Right    CESAREAN SECTION     X 2   HYSTEROSCOPY  4/95   w/D&C   TEE WITHOUT CARDIOVERSION N/A 10/20/2018   Procedure: TRANSESOPHAGEAL ECHOCARDIOGRAM (TEE);  Surgeon: Buford Dresser, MD;  Location: [facility];  Service: Cardiovascular;  Laterality: N/A;    Current Medications: No outpatient medications have been marked as taking for the 04/13/21 encounter (Appointment) with Alonnie Maki, Lanice Schwab, NP.     Allergies:   Atorvastatin, Crab [shellfish allergy], Penicillins, Amlodipine, and Sulfa antibiotics   Social History   Socioeconomic History   Marital status: Widowed    Spouse name: Not on file   Number of children: 2   Years of education: Not on file   Highest education level: Not on file  Occupational History   Not on file  Tobacco Use   Smoking status: Former    Packs/day: 0.25    Years: 12.00    Pack years: 3.00    Types: Cigarettes    Start date: 01/16/1957    Quit date: 01/23/1968    Years since quitting: 53.2   Smokeless tobacco: Never  Vaping Use   Vaping Use: Never used  Substance and Sexual Activity   Alcohol use: Yes    Comment: social   Drug use: No   Sexual activity: Not Currently    Partners: Male    Birth control/protection: Post-menopausal  Other Topics Concern   Not on file  Social History Narrative    Not on file   Social Determinants of Health   Financial Resource Strain: Not on file  Food Insecurity: Not on file  Transportation Needs: Not on file  Physical Activity: Not on file  Stress: Not on file  Social Connections: Not on file     Family History: [name] family history includes Breast cancer in her maternal grandmother; COPD in her father; Diabetes in her maternal aunt, maternal aunt, maternal grandfather, and son; Heart disease in her father; Hypertension in her mother; Kidney cancer in her brother; Rheum arthritis in her brother.  ROS:   Please see the history of present illness.    + mild nonpitting lower extremity edema + mild shortness of breath All other systems reviewed and are negative.  Labs/Other Studies Reviewed:    The following studies were reviewed today:  Echo 02/04/21  Left Ventricle: Left ventricular ejection fraction, by estimation, is 60  to 65%. The left ventricle has normal function. The  left ventricle has no  regional wall motion abnormalities. The left ventricular internal cavity  size was normal in size. There is  mild asymmetric left ventricular hypertrophy of the basal-septal segment.  Left ventricular diastolic parameters are indeterminate.  Right Ventricle: The right ventricular size is normal. No increase in  right ventricular wall thickness. Right ventricular systolic function is  normal. There is normal pulmonary artery systolic pressure. The tricuspid  regurgitant velocity is 2.42 m/s, and   with an assumed right atrial pressure of 3 mmHg, the estimated right  ventricular systolic pressure is 94.8 mmHg.  Left Atrium: Left atrial size was moderately dilated.  Right Atrium: Right atrial size was normal in size.  Pericardium: There is no evidence of pericardial effusion.  Mitral Valve: The mitral valve is degenerative in appearance. There is  mild thickening of the mitral valve leaflet(s). There is mild  calcification of the mitral  valve leaflet(s). Moderate to severe mitral  annular calcification. Trivial mitral valve regurgitation.  Tricuspid Valve: The tricuspid valve is normal in structure. Tricuspid  valve regurgitation is trivial.  Aortic Valve: The aortic valve is tricuspid. There is mild calcification  of the aortic valve. There is mild thickening of the aortic valve. Aortic  valve regurgitation is not visualized. Aortic valve  sclerosis/calcification is present, without any  evidence of aortic stenosis.  Pulmonic Valve: The pulmonic valve was normal in structure. Pulmonic valve  regurgitation is trivial.  Aorta: The aortic root and ascending aorta are structurally normal, with  no evidence of dilitation.  Venous: The inferior vena cava is normal in size with greater than 50%  respiratory variability, suggesting right atrial pressure of 3 mmHg.  IAS/Shunts: The atrial septum is grossly normal.   24 Hr BP monitor    Episodic spikes in BP over the course of 24 hours. Maximum systolic blood pressure 546 mmHg. 27% of systolic blood pressures greater than 140 mmHg. Diastolic blood pressures typically well controlled.  Recent Labs: 11/03/2020: TSH 3.71 02/03/2021: ALT 15; Hemoglobin 13.8; Platelets 232 03/23/2021: BUN 22; Creatinine, Ser 0.83; NT-Pro BNP 1,089; Potassium 4.8; Sodium 141  Recent Lipid Panel No results found for: CHOL, TRIG, HDL, CHOLHDL, VLDL, LDLCALC, LDLDIRECT   Risk Assessment/Calculations:    CHA2DS2-VASc Score = 4 This indicates a 4.8% annual risk of stroke. [name] score is based upon: CHF History: 0 HTN History: 1 Diabetes History: 0 Stroke History: 0 Vascular Disease History: 0 Age Score: 2 Gender Score: 1    Physical Exam:    VS:  LMP 01/23/1988 (Approximate)     Wt Readings from Last 3 Encounters:  03/23/21 134 lb (60.8 kg)  03/16/21 136 lb (61.7 kg)  03/07/21 133 lb (60.3 kg)     GEN:  Well nourished, well developed in no acute distress HEENT: Normal NECK:  No JVD; No carotid bruits CARDIAC: RRR, no murmurs, rubs, gallops RESPIRATORY:  Clear to auscultation without rales, wheezing or rhonchi  ABDOMEN: Soft, non-tender, non-distended MUSCULOSKELETAL:  Mild, non-pitting bilateral lower extremity edema; No deformity. 2+ pedal pulses, equal bilaterally SKIN: Warm and dry NEUROLOGIC:  Alert and oriented x 3 PSYCHIATRIC:  Normal affect   EKG:  EKG is not ordered today.    Diagnoses:    No diagnosis found.  Assessment and Plan:     PAF on antiarrhythmic medication, on chronic anticoagulation: Maintaining sinus rhythm with heart rate well controlled.  No bleeding concerns on Eliquis.  Her weight is 60.3 kg putting her right on the cusp of  requiring lower dose Eliquis. I will see her back in one month and will monitor weight closely.  She is questioning whether she started carvedilol as advised by Dr. Curt Bears on 02/07/2021.  She will double check pill bottles at home and if not already taking will start carvedilol 6.25 mg twice daily.  Continue flecainide, Eliquis.   Essential hypertension: Blood pressures well controlled today.  She is currently off all antihypertensives unless as noted above she is taking the carvedilol as prescribed. We will plan for 5 days of consistent blood pressure monitoring 1 time per day.  I encouraged her not to be alarmed by slightly elevated readings and that 5 days of consistent data will be helpful in determining plan moving forward. Would favor limiting antihypertensive therapy to carvedilol if BP remains well-controlled.   Hyperlipidemia/Aortic atherosclerosis: No recent cholesterol screening. Will plan for fasting lab work at next office visit. I am hesitant to make more than one adjustment at the time but would favor discontinuing simvastatin and having her start rosuvastatin or atorvastatin since there are less drug interactions with these medications.   Shortness of breath: She reports mild occasional shortness of  breath that she feels is secondary to decreased physical activity since Covid pandemic. Reviewed echo results with her in detail. Systolic function is normal, diastolic function is indeterminate. No edema, no orthopnea, no PND. Advised her to increase physical activity and report any concerns.   Itching: Has frequent itching with medications. Has seen dermatology and was told to use anti-itch cream. Advised her to take Zyrtec as needed for allergy/itching to see if symptoms improve.   Disposition: 1 month f/u with APP     Medication Adjustments/Labs and Tests Ordered: Current medicines are reviewed at length with the patient today.  Concerns regarding medicines are outlined above.  No orders of the defined types were placed in this encounter.  No orders of the defined types were placed in this encounter.   There are no Patient Instructions on file for this visit.   Signed, Emmaline Life, NP  03/30/2021 3:13 PM    [facility]

## 2021-03-31 ENCOUNTER — Telehealth: Payer: Self-pay | Admitting: Interventional Cardiology

## 2021-03-31 ENCOUNTER — Encounter: Payer: Self-pay | Admitting: Interventional Cardiology

## 2021-03-31 NOTE — Telephone Encounter (Signed)
Pt calling because she is confused on how much Furosemide to take.  Advised per Dr Irish Lack she was to take 40 mg for 3 days and then return to 20 mg daily.  Pt reports she took 40 mg for 2 days and went back to 20 mg daily.  Advised to continue to take 20 mg daily and call back if SOB returns.  She states understanding.   ?

## 2021-03-31 NOTE — Telephone Encounter (Signed)
Patient called to talk with Dr. Irish Lack and nurse. Please call back  ?

## 2021-04-03 DIAGNOSIS — R7989 Other specified abnormal findings of blood chemistry: Secondary | ICD-10-CM | POA: Diagnosis not present

## 2021-04-03 DIAGNOSIS — R7303 Prediabetes: Secondary | ICD-10-CM | POA: Diagnosis not present

## 2021-04-03 DIAGNOSIS — L817 Pigmented purpuric dermatosis: Secondary | ICD-10-CM | POA: Diagnosis not present

## 2021-04-03 DIAGNOSIS — E782 Mixed hyperlipidemia: Secondary | ICD-10-CM | POA: Diagnosis not present

## 2021-04-03 DIAGNOSIS — I10 Essential (primary) hypertension: Secondary | ICD-10-CM | POA: Diagnosis not present

## 2021-04-03 DIAGNOSIS — Z5181 Encounter for therapeutic drug level monitoring: Secondary | ICD-10-CM | POA: Diagnosis not present

## 2021-04-03 DIAGNOSIS — I480 Paroxysmal atrial fibrillation: Secondary | ICD-10-CM | POA: Diagnosis not present

## 2021-04-12 DIAGNOSIS — J189 Pneumonia, unspecified organism: Secondary | ICD-10-CM | POA: Diagnosis not present

## 2021-04-12 DIAGNOSIS — R0689 Other abnormalities of breathing: Secondary | ICD-10-CM | POA: Diagnosis not present

## 2021-04-13 ENCOUNTER — Ambulatory Visit: Payer: Medicare PPO | Admitting: Nurse Practitioner

## 2021-04-13 ENCOUNTER — Encounter: Payer: Self-pay | Admitting: Nurse Practitioner

## 2021-04-13 ENCOUNTER — Other Ambulatory Visit: Payer: Self-pay

## 2021-04-13 VITALS — BP 162/92 | HR 68 | Ht 63.5 in | Wt 131.8 lb

## 2021-04-13 DIAGNOSIS — Z7901 Long term (current) use of anticoagulants: Secondary | ICD-10-CM | POA: Diagnosis not present

## 2021-04-13 DIAGNOSIS — I480 Paroxysmal atrial fibrillation: Secondary | ICD-10-CM | POA: Diagnosis not present

## 2021-04-13 DIAGNOSIS — I10 Essential (primary) hypertension: Secondary | ICD-10-CM | POA: Diagnosis not present

## 2021-04-13 DIAGNOSIS — E782 Mixed hyperlipidemia: Secondary | ICD-10-CM

## 2021-04-13 DIAGNOSIS — I700 Atherosclerosis of aorta: Secondary | ICD-10-CM | POA: Diagnosis not present

## 2021-04-13 MED ORDER — APIXABAN 2.5 MG PO TABS: 2.5000 mg | ORAL_TABLET | Freq: Two times a day (BID) | ORAL | 3 refills | Status: DC

## 2021-04-13 NOTE — Patient Instructions (Addendum)
Medication Instructions:  ?Your physician recommends that you continue on your current medications as directed. Please refer to the Current Medication list given to you today. ? ?*If you need a refill on your cardiac medications before your next appointment, please call your pharmacy* ? ? ?Lab Work: ?None Ordered ?If you have labs (blood work) drawn today and your tests are completely normal, you will receive your results only by: ?MyChart Message (if you have MyChart) OR ?A paper copy in the mail ?If you have any lab test that is abnormal or we need to change your treatment, we will call you to review the results. ? ? ?Testing/Procedures: ?None Ordered ? ? ?Follow-Up: ?At [facility], you and your health needs are our priority.  As part of our continuing mission to provide you with exceptional heart care, we have created designated Provider Care Teams.  These Care Teams include your primary Cardiologist (physician) and Advanced Practice Providers (APPs -  Physician Assistants and Nurse Practitioners) who all work together to provide you with the care you need, when you need it. ? ?We recommend signing up for the patient portal called "MyChart".  Sign up information is provided on this After Visit Summary.  MyChart is used to connect with patients for Virtual Visits (Telemedicine).  Patients are able to view lab/test results, encounter notes, upcoming appointments, etc.  Non-urgent messages can be sent to your provider as well.   ?To learn more about what you can do with MyChart, go to [email or web address].   ? ?Your next appointment:   ?4 month(s) ? ?The format for your next appointment:   ?In Person ? ?Provider:   ?You may see Will Meredith Leeds, MD or one of the following Advanced Practice Providers on your designated Care Team:   ?Tommye Standard, PA-C ?Legrand Como "Jonni Sanger" [place], PA-C  ? ? ?Other Instructions ?Increase your exercise, gradually ?Please let us know if your shortness of breath gets worse ?

## 2021-04-17 DIAGNOSIS — I10 Essential (primary) hypertension: Secondary | ICD-10-CM | POA: Diagnosis not present

## 2021-04-17 DIAGNOSIS — E782 Mixed hyperlipidemia: Secondary | ICD-10-CM | POA: Diagnosis not present

## 2021-04-17 DIAGNOSIS — R7303 Prediabetes: Secondary | ICD-10-CM | POA: Diagnosis not present

## 2021-04-17 DIAGNOSIS — M8580 Other specified disorders of bone density and structure, unspecified site: Secondary | ICD-10-CM | POA: Diagnosis not present

## 2021-04-17 DIAGNOSIS — J189 Pneumonia, unspecified organism: Secondary | ICD-10-CM | POA: Diagnosis not present

## 2021-04-17 DIAGNOSIS — Z7901 Long term (current) use of anticoagulants: Secondary | ICD-10-CM | POA: Diagnosis not present

## 2021-04-17 DIAGNOSIS — I480 Paroxysmal atrial fibrillation: Secondary | ICD-10-CM | POA: Diagnosis not present

## 2021-04-17 DIAGNOSIS — R0689 Other abnormalities of breathing: Secondary | ICD-10-CM | POA: Diagnosis not present

## 2021-04-18 ENCOUNTER — Other Ambulatory Visit: Payer: Self-pay | Admitting: Family Medicine

## 2021-04-18 ENCOUNTER — Ambulatory Visit
Admission: RE | Admit: 2021-04-18 | Discharge: 2021-04-18 | Disposition: A | Discharge status: Home / Self Care | Payer: Medicare PPO | Source: Ambulatory Visit | Attending: Family Medicine | Admitting: Family Medicine

## 2021-04-18 DIAGNOSIS — R918 Other nonspecific abnormal finding of lung field: Secondary | ICD-10-CM | POA: Diagnosis not present

## 2021-04-18 DIAGNOSIS — J181 Lobar pneumonia, unspecified organism: Secondary | ICD-10-CM

## 2021-04-18 DIAGNOSIS — Z8701 Personal history of pneumonia (recurrent): Secondary | ICD-10-CM | POA: Diagnosis not present

## 2021-04-18 DIAGNOSIS — J984 Other disorders of lung: Secondary | ICD-10-CM | POA: Diagnosis not present

## 2021-04-20 DIAGNOSIS — R0689 Other abnormalities of breathing: Secondary | ICD-10-CM | POA: Diagnosis not present

## 2021-04-20 DIAGNOSIS — I10 Essential (primary) hypertension: Secondary | ICD-10-CM | POA: Diagnosis not present

## 2021-04-20 DIAGNOSIS — J189 Pneumonia, unspecified organism: Secondary | ICD-10-CM | POA: Diagnosis not present

## 2021-04-20 DIAGNOSIS — M8580 Other specified disorders of bone density and structure, unspecified site: Secondary | ICD-10-CM | POA: Diagnosis not present

## 2021-04-20 DIAGNOSIS — R7303 Prediabetes: Secondary | ICD-10-CM | POA: Diagnosis not present

## 2021-04-20 DIAGNOSIS — E782 Mixed hyperlipidemia: Secondary | ICD-10-CM | POA: Diagnosis not present

## 2021-04-20 DIAGNOSIS — I480 Paroxysmal atrial fibrillation: Secondary | ICD-10-CM | POA: Diagnosis not present

## 2021-04-20 DIAGNOSIS — Z7901 Long term (current) use of anticoagulants: Secondary | ICD-10-CM | POA: Diagnosis not present

## 2021-04-25 ENCOUNTER — Other Ambulatory Visit: Payer: Self-pay | Admitting: Family Medicine

## 2021-04-25 ENCOUNTER — Ambulatory Visit
Admission: RE | Admit: 2021-04-25 | Discharge: 2021-04-25 | Disposition: A | Discharge status: Home / Self Care | Payer: Medicare PPO | Source: Ambulatory Visit | Attending: Family Medicine | Admitting: Family Medicine

## 2021-04-25 DIAGNOSIS — Z09 Encounter for follow-up examination after completed treatment for conditions other than malignant neoplasm: Secondary | ICD-10-CM

## 2021-04-25 DIAGNOSIS — J8489 Other specified interstitial pulmonary diseases: Secondary | ICD-10-CM | POA: Diagnosis not present

## 2021-04-25 DIAGNOSIS — J189 Pneumonia, unspecified organism: Secondary | ICD-10-CM

## 2021-04-25 DIAGNOSIS — R918 Other nonspecific abnormal finding of lung field: Secondary | ICD-10-CM | POA: Diagnosis not present

## 2021-04-27 ENCOUNTER — Other Ambulatory Visit: Payer: Self-pay | Admitting: Family Medicine

## 2021-04-27 ENCOUNTER — Ambulatory Visit: Payer: Medicare PPO | Admitting: Internal Medicine

## 2021-04-27 DIAGNOSIS — I480 Paroxysmal atrial fibrillation: Secondary | ICD-10-CM | POA: Diagnosis not present

## 2021-04-27 DIAGNOSIS — J189 Pneumonia, unspecified organism: Secondary | ICD-10-CM | POA: Diagnosis not present

## 2021-04-27 DIAGNOSIS — E782 Mixed hyperlipidemia: Secondary | ICD-10-CM | POA: Diagnosis not present

## 2021-04-27 DIAGNOSIS — Z7901 Long term (current) use of anticoagulants: Secondary | ICD-10-CM | POA: Diagnosis not present

## 2021-04-27 DIAGNOSIS — R7303 Prediabetes: Secondary | ICD-10-CM | POA: Diagnosis not present

## 2021-04-27 DIAGNOSIS — R0689 Other abnormalities of breathing: Secondary | ICD-10-CM | POA: Diagnosis not present

## 2021-04-27 DIAGNOSIS — I10 Essential (primary) hypertension: Secondary | ICD-10-CM | POA: Diagnosis not present

## 2021-04-27 DIAGNOSIS — M8580 Other specified disorders of bone density and structure, unspecified site: Secondary | ICD-10-CM | POA: Diagnosis not present

## 2021-04-28 ENCOUNTER — Other Ambulatory Visit: Payer: Medicare PPO

## 2021-04-28 ENCOUNTER — Other Ambulatory Visit: Payer: Self-pay | Admitting: Interventional Cardiology

## 2021-05-02 ENCOUNTER — Ambulatory Visit
Admission: RE | Admit: 2021-05-02 | Discharge: 2021-05-02 | Disposition: A | Discharge status: Home / Self Care | Payer: Medicare PPO | Source: Ambulatory Visit | Attending: Family Medicine | Admitting: Family Medicine

## 2021-05-02 DIAGNOSIS — R0689 Other abnormalities of breathing: Secondary | ICD-10-CM

## 2021-05-02 DIAGNOSIS — J189 Pneumonia, unspecified organism: Secondary | ICD-10-CM | POA: Diagnosis not present

## 2021-05-05 ENCOUNTER — Telehealth: Payer: Self-pay | Admitting: Pulmonary Disease

## 2021-05-05 NOTE — Telephone Encounter (Signed)
I spoke with the nurse at [facility] and the provider wanted the patient to be seen sooner than 05/08/21 and I let her know that we did not have anythign sooner that her office visit. Nothing further needed.  ?

## 2021-05-05 NOTE — Telephone Encounter (Signed)
Attempted to call Anna Mathews but unable to reach. Left message for her to return call. ?

## 2021-05-08 ENCOUNTER — Ambulatory Visit: Payer: Medicare PPO | Admitting: Pulmonary Disease

## 2021-05-08 ENCOUNTER — Encounter: Payer: Self-pay | Admitting: Pulmonary Disease

## 2021-05-08 VITALS — BP 118/80 | HR 61 | Ht 63.5 in | Wt 127.0 lb

## 2021-05-08 DIAGNOSIS — J849 Interstitial pulmonary disease, unspecified: Secondary | ICD-10-CM

## 2021-05-08 LAB — COMPREHENSIVE METABOLIC PANEL
ALT: 13 U/L (ref 0–35)
AST: 15 U/L (ref 0–37)
Albumin: 3.9 g/dL (ref 3.5–5.2)
Alkaline Phosphatase: 90 U/L (ref 39–117)
BUN: 27 mg/dL — ABNORMAL HIGH (ref 6–23)
CO2: 34 mEq/L — ABNORMAL HIGH (ref 19–32)
Calcium: 9.6 mg/dL (ref 8.4–10.5)
Chloride: 99 mEq/L (ref 96–112)
Creatinine, Ser: 0.89 mg/dL (ref 0.40–1.20)
GFR: 60.43 mL/min (ref >60.00)
Glucose, Bld: 99 mg/dL (ref 70–99)
Potassium: 4.3 mEq/L (ref 3.5–5.1)
Sodium: 140 mEq/L (ref 135–145)
Total Bilirubin: 0.5 mg/dL (ref 0.2–1.2)
Total Protein: 7.5 g/dL (ref 6.0–8.3)

## 2021-05-08 LAB — CBC WITH DIFFERENTIAL/PLATELET
Basophils Absolute: 0 10*3/uL (ref 0.0–0.1)
Basophils Relative: 0.1 % (ref 0.0–3.0)
Eosinophils Absolute: 0.2 10*3/uL (ref 0.0–0.7)
Eosinophils Relative: 2.2 % (ref 0.0–5.0)
HCT: 37.2 % (ref 36.0–46.0)
Hemoglobin: 12.1 g/dL (ref 12.0–15.0)
Lymphocytes Relative: 12.7 % (ref 12.0–46.0)
Lymphs Abs: 1 10*3/uL (ref 0.7–4.0)
MCHC: 32.4 g/dL (ref 30.0–36.0)
MCV: 88.3 fl (ref 78.0–100.0)
Monocytes Absolute: 0.6 10*3/uL (ref 0.1–1.0)
Monocytes Relative: 8 % (ref 3.0–12.0)
Neutro Abs: 6.1 10*3/uL (ref 1.4–7.7)
Neutrophils Relative %: 77 % (ref 43.0–77.0)
Platelets: 320 10*3/uL (ref 150.0–400.0)
RBC: 4.22 Mil/uL (ref 3.87–5.11)
RDW: 14.6 % (ref 11.5–15.5)
WBC: 8 10*3/uL (ref 4.0–10.5)

## 2021-05-08 LAB — CK: Total CK: 26 U/L (ref 7–177)

## 2021-05-08 LAB — SEDIMENTATION RATE: Sed Rate: 24 mm/hr (ref 0–30)

## 2021-05-08 NOTE — Patient Instructions (Addendum)
We will check inflammatory labs today to evaluate for inflammatory lung disease ? ?We will be in touch with you once the results are in and determine the next steps. We will consider bronchoscopy for further evaluation if needed. ? ?Follow up in 1 month ?

## 2021-05-08 NOTE — Progress Notes (Signed)
? ?Synopsis: Referred in April 2023 for cough by Yaakov Guthrie, MD ? ?Subjective:  ? ?PATIENT ID: Anna Mathews GENDER: female DOB: 1940-07-25, MRN: [id number] ? ?HPI ? ?Chief Complaint  ?Patient presents with  ? Consult  ?  Referred by PCP for history of PNA. States she does not have a cough. Increased fatigue.   ? ?Neka Bise is an 81 year old woman, former smoker with atrial fibrillation, GERD, hiatal hernia, and hypertension who is referred to pulmonary clinic for cough.  ? ?She reports having shortness of breath since the beginning of the year which progressed in mid to late February. She was seeing her cardiology team and her blood pressure medications were being titrated at that time. There was concern she had drug induced rash secondary to amlodipine but it seems that the rash persisted despite switching off this medication. 1/13 for hypertensive urgency but no chest imaging performed at that time. She was given '15mg'$  of IV hydralazine in the ED on 02/03/21.  ? ?She has been adjusting many different antihypertensive agents with the cardiology team. She was started on hydralazine '10mg'$  BID on 2/7 then called the cardiology office 2/10 with reports of redness on her arms/legs with a burning sensation. The dose was reduced then ultimately stopped on 2/13.  ? ?She continued to have progressive dyspnea in early March. She was started on lasix at that time with no improvement. A chest x-ray 03/28/21 was obtained which showed which showed right lower lobe and left perihilar opacities concerning for pneumonia. She was treated with doxycyline without much improvement. She continued to feel weak with low energy. She was given levaquin by her primary care on 3/23 which did not help improve her symptoms. She had x-ray 3/28 which showed progressive multifocal infiltrates. She was then treated with doxycycline again with no improvement. X ray 4/4 showed improvement of opacities on right but persistent left sided opacities.   ? ?CT Chest 4/11 showed multiple ground glass opacities in both upper lobes and right lower lobe.  ? ?She continues to have dyspnea, dry cough and fatigue. She has intermittent nausea. She has lost 12-15lbs since last fall. She denies any joint pains and no other skin rashes.  ? ?She quit smoking in 1970. Denies any harmful occupational exposures. Her father had emphysema.  ? ?Past Medical History:  ?Diagnosis Date  ? Atrial fibrillation ([place]) 09/2009  ? Breast cancer ([place])   ? Diverticulosis 09/2006  ? GERD (gastroesophageal reflux disease)   ? Hiatal hernia   ? Hyperlipidemia   ? Hypertension   ? Osteopenia   ? Postmenopausal hormone replacement therapy 1989 - 07/2000  ?  ? ?Family History  ?Problem Relation Age of Onset  ? Hypertension Mother   ? Heart disease Father   ? COPD Father   ? Diabetes Maternal Grandfather   ? Diabetes Son   ? Rheum arthritis Brother   ? Kidney cancer Brother   ? Breast cancer Maternal Grandmother   ? Diabetes Maternal Aunt   ? Diabetes Maternal Aunt   ?  ? ?Social History  ? ?Socioeconomic History  ? Marital status: Widowed  ?  Spouse name: Not on file  ? Number of children: 2  ? Years of education: Not on file  ? Highest education level: Not on file  ?Occupational History  ? Not on file  ?Tobacco Use  ? Smoking status: Former  ?  Packs/day: 0.25  ?  Years: 12.00  ?  Pack years: 3.00  ?  Types: Cigarettes  ?  Start date: 01/16/1957  ?  Quit date: 01/23/1968  ?  Years since quitting: 53.3  ? Smokeless tobacco: Never  ?Vaping Use  ? Vaping Use: Never used  ?Substance and Sexual Activity  ? Alcohol use: Yes  ?  Comment: social  ? Drug use: No  ? Sexual activity: Not Currently  ?  Partners: Male  ?  Birth control/protection: Post-menopausal  ?Other Topics Concern  ? Not on file  ?Social History Narrative  ? Not on file  ? ?Social Determinants of Health  ? ?Financial Resource Strain: Not on file  ?Food Insecurity: Not on file  ?Transportation Needs: Not on file  ?Physical Activity: Not on file   ?Stress: Not on file  ?Social Connections: Not on file  ?Intimate Partner Violence: Not on file  ?  ? ?Allergies  ?Allergen Reactions  ? Atorvastatin Other (See Comments)  ?  Other reaction(s): messed with her liver  ? Crab [Shellfish Allergy] Itching  ?  Full body itching  ? Penicillins Other (See Comments)  ?  REACTION: "ITCHING IN EYES" ?Has patient had a PCN reaction causing immediate rash, facial/tongue/throat swelling, SOB or lightheadedness with hypotension: Unknown ?Has patient had a PCN reaction causing severe rash involving mucus membranes or skin necrosis: Unknown ?Has patient had a PCN reaction that required hospitalization: No ?Has patient had a PCN reaction occurring within the last 10 years: No ?If all of the above answers are "NO", then may proceed with Cephalosporin use. ?  ? Amlodipine Itching  ? Sulfa Antibiotics Other (See Comments)  ?  REACTION: " NOT SURE" ?Other reaction(s): eyes itch  ?  ? ?Outpatient Medications Prior to Visit  ?Medication Sig Dispense Refill  ? apixaban (ELIQUIS) 2.5 MG TABS tablet Take 1 tablet (2.5 mg total) by mouth 2 (two) times daily. 60 tablet 3  ? aspirin 81 MG chewable tablet Chew 81 mg by mouth daily.    ? Biotin 5000 MCG TABS Take 5,000 mcg by mouth daily.    ? CALCIUM 600 1500 (600 Ca) MG TABS tablet SMARTSIG:1 By Mouth    ? Calcium-Magnesium-Vitamin D (CALCIUM 1200+D3 PO) Take 1 tablet by mouth in the morning and at bedtime.    ? doxycycline (VIBRAMYCIN) 100 MG capsule Take 100 mg by mouth in the morning and at bedtime.    ? flecainide (TAMBOCOR) 150 MG tablet Take 150 mg by mouth 2 (two) times daily.    ? furosemide (LASIX) 20 MG tablet Take 1 tablet (20 mg total) by mouth daily. 30 tablet 6  ? metoprolol succinate (TOPROL XL) 25 MG 24 hr tablet Take 0.5 tablets (12.5 mg total) by mouth at bedtime. 15 tablet 11  ? Multiple Vitamins-Minerals (CENTRUM SILVER 50+MEN) TABS SMARTSIG:1 By Mouth    ? Omega-3 Fatty Acids (FISH OIL) 1200 MG CAPS Take 1,200 mg by mouth  in the morning and at bedtime.    ? simvastatin (ZOCOR) 20 MG tablet Take 20 mg by mouth every evening.    ? acetaminophen (TYLENOL) 500 MG tablet Take 500-1,000 mg by mouth every 6 (six) hours as needed for headache or mild pain.    ? LINZESS 145 MCG CAPS capsule Take by mouth.    ? ?No facility-administered medications prior to visit.  ? ?Review of Systems  ?Constitutional:  Positive for malaise/fatigue. Negative for chills, fever and weight loss.  ?HENT:  Negative for congestion, sinus pain and sore throat.   ?Eyes: Negative.   ?Respiratory:  Positive  for cough and shortness of breath. Negative for hemoptysis, sputum production and wheezing.   ?Cardiovascular:  Negative for chest pain, palpitations, orthopnea, claudication and leg swelling.  ?Gastrointestinal:  Positive for nausea. Negative for abdominal pain, heartburn and vomiting.  ?Genitourinary: Negative.   ?Musculoskeletal:  Negative for joint pain and myalgias.  ?Skin:  Negative for rash.  ?Neurological:  Negative for weakness.  ?Endo/Heme/Allergies: Negative.   ?Psychiatric/Behavioral: Negative.    ? ?Objective:  ? ?Vitals:  ? 05/08/21 1144  ?BP: 118/80  ?Pulse: 61  ?SpO2: 97%  ?Weight: 127 lb (57.6 kg)  ?Height: 5' 3.5" (1.613 m)  ? ?Physical Exam ?Constitutional:   ?   General: She is not in acute distress. ?   Appearance: She is not ill-appearing.  ?HENT:  ?   Head: Normocephalic and atraumatic.  ?Eyes:  ?   General: No scleral icterus. ?   Conjunctiva/sclera: Conjunctivae normal.  ?   Pupils: Pupils are equal, round, and reactive to light.  ?Cardiovascular:  ?   Rate and Rhythm: Normal rate and regular rhythm.  ?   Pulses: Normal pulses.  ?   Heart sounds: Normal heart sounds. No murmur heard. ?Pulmonary:  ?   Effort: Pulmonary effort is normal.  ?   Breath sounds: Rales (scattered) present. No wheezing or rhonchi.  ?Abdominal:  ?   General: Bowel sounds are normal.  ?   Palpations: Abdomen is soft.  ?Musculoskeletal:  ?   Right lower leg: No edema.   ?   Left lower leg: No edema.  ?Lymphadenopathy:  ?   Cervical: No cervical adenopathy.  ?Skin: ?   General: Skin is warm and dry.  ?Neurological:  ?   General: No focal deficit present.  ?   Mental Sta

## 2021-05-09 ENCOUNTER — Institutional Professional Consult (permissible substitution): Payer: Medicare PPO | Admitting: Pulmonary Disease

## 2021-05-09 DIAGNOSIS — Z7901 Long term (current) use of anticoagulants: Secondary | ICD-10-CM | POA: Diagnosis not present

## 2021-05-09 DIAGNOSIS — I10 Essential (primary) hypertension: Secondary | ICD-10-CM | POA: Diagnosis not present

## 2021-05-09 DIAGNOSIS — J189 Pneumonia, unspecified organism: Secondary | ICD-10-CM | POA: Diagnosis not present

## 2021-05-09 DIAGNOSIS — R7303 Prediabetes: Secondary | ICD-10-CM | POA: Diagnosis not present

## 2021-05-09 DIAGNOSIS — I480 Paroxysmal atrial fibrillation: Secondary | ICD-10-CM | POA: Diagnosis not present

## 2021-05-10 ENCOUNTER — Telehealth: Payer: Self-pay

## 2021-05-10 LAB — ALDOLASE: Aldolase: 4.1 U/L (ref <=8.1)

## 2021-05-10 LAB — CYCLIC CITRUL PEPTIDE ANTIBODY, IGG/IGA: Cyclic Citrullin Peptide Ab: 4 units (ref 0–19)

## 2021-05-10 NOTE — Telephone Encounter (Signed)
NOTES SCANNED TO REFERRAL 

## 2021-05-11 ENCOUNTER — Ambulatory Visit: Payer: Medicare PPO | Admitting: Physician Assistant

## 2021-05-11 ENCOUNTER — Encounter: Payer: Self-pay | Admitting: Physician Assistant

## 2021-05-11 VITALS — BP 124/64 | HR 59 | Ht 63.5 in | Wt 129.0 lb

## 2021-05-11 DIAGNOSIS — I480 Paroxysmal atrial fibrillation: Secondary | ICD-10-CM

## 2021-05-11 DIAGNOSIS — I484 Atypical atrial flutter: Secondary | ICD-10-CM

## 2021-05-11 LAB — ANCA SCREEN W REFLEX TITER: ANCA SCREEN: NEGATIVE

## 2021-05-11 NOTE — Patient Instructions (Signed)
Medication Instructions:  ?Your physician recommends that you continue on your current medications as directed. Please refer to the Current Medication list given to you today. ? ?*If you need a refill on your cardiac medications before your next appointment, please call your pharmacy* ? ? ?Lab Work: ?None ordered ?If you have labs (blood work) drawn today and your tests are completely normal, you will receive your results only by: ?MyChart Message (if you have MyChart) OR ?A paper copy in the mail ?If you have any lab test that is abnormal or we need to change your treatment, we will call you to review the results. ? ? ?Testing/Procedures: ?None ordered ? ? ?Follow-Up: ?At [facility], you and your health needs are our priority.  As part of our continuing mission to provide you with exceptional heart care, we have created designated Provider Care Teams.  These Care Teams include your primary Cardiologist (physician) and Advanced Practice Providers (APPs -  Physician Assistants and Nurse Practitioners) who all work together to provide you with the care you need, when you need it. ? ?We recommend signing up for the patient portal called "MyChart".  Sign up information is provided on this After Visit Summary.  MyChart is used to connect with patients for Virtual Visits (Telemedicine).  Patients are able to view lab/test results, encounter notes, upcoming appointments, etc.  Non-urgent messages can be sent to your provider as well.   ?To learn more about what you can do with MyChart, go to [email or web address].   ? ?Your next appointment:   ?WE WILL SPEAK WITH DR CAMNITZ AND CONTACT YOU WITH ANY APPOINTMENT  ? ? ?Other Instructions ? ? ?Important Information About Sugar ? ? ? ? ?  ?

## 2021-05-11 NOTE — Progress Notes (Signed)
?Cardiology Office Note:   ? ?Date:  05/11/2021  ? ?ID:  Anna Mathews, DOB [birth year], MRN [id number] ? ?PCP:  Vernie Shanks, MD  ?[facility] Cardiologist:  Larae Grooms, MD  ?[place] Electrophysiologist:  Will Meredith Leeds, MD  ? ?Chief Complaint: 4 weeks follow up  ? ?History of Present Illness:   ? ?Anna Mathews is a 81 y.o. 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  ? ?History of rash on amlodipine and quinapril.  Did not tolerated chlorthalidone due to dizziness.   ? ?Admitted January 2023 for blood pressure issue. Multiple office visit afterwards to blood pressure management.  ? ?Last seen 04/13/21 by [name], NP. She was taking doxycycline for pneumonia>> no improvement. Started on new abx by primary. BP was stable.  ? ?CT of chest 05/02/21: ?IMPRESSION: ?Multiple airspace opacities are noted bilaterally concerning for ?multifocal pneumonia, although some may represent sequela of ?previous inflammation or infection. ?2.4 cm left thyroid nodule. Recommend thyroid US. (Ref: J Am Coll ?Radiol. 2015 Feb;12(2): 143-50). ?Moderate coronary artery calcifications are noted suggesting ?coronary artery disease. ?Aortic Atherosclerosis (ICD10-I70.0). ? ?Seen by pulmonary 05/08/21. Note is pending. ? ?Patient is here for follow-up of with daughter.  Seen by PCP yesterday and there was concern that flecainide can cause pneumonitis.  She is here for further discussion.  She continues to have intermittent shortness of breath, worse with exertion.  No chest pain.  Stable lower extremity edema. ? ?Past Medical History:  ?Diagnosis Date  ? Atrial fibrillation ([place]) 09/2009  ? Breast cancer ([place])   ? Diverticulosis 09/2006  ? GERD (gastroesophageal reflux disease)   ? Hiatal hernia   ? Hyperlipidemia   ? Hypertension   ? Osteopenia   ? Postmenopausal hormone  replacement therapy 1989 - 07/2000  ? ? ?Past Surgical History:  ?Procedure Laterality Date  ? BREAST DUCTAL SYSTEM EXCISION Left 06/2002  ? 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 BREAST LUMPECTOMY WITH RADIOACTIVE SEED LOCALIZATION Right 01/27/2020  ? Procedure: RIGHT BREAST LUMPECTOMY WITH RADIOACTIVE SEED LOCALIZATION;  Surgeon: Coralie Keens, MD;  Location: [place];  Service: General;  Laterality: Right;  ? BREAST SURGERY Left 4/99  ? breast biopsy - fibrosis  ? CARDIOVERSION N/A 07/16/2017  ? Procedure: CARDIOVERSION;  Surgeon: Jerline Pain, MD;  Location: [facility] ENDOSCOPY;  Service: Cardiovascular;  Laterality: N/A;  ? CARDIOVERSION N/A 10/20/2018  ? Procedure: CARDIOVERSION;  Surgeon: Buford Dresser, MD;  Location: [facility] ENDOSCOPY;  Service: Cardiovascular;  Laterality: N/A;  ? CATARACT EXTRACTION W/ INTRAOCULAR LENS IMPLANT Right   ? CESAREAN SECTION    ? X 2  ? HYSTEROSCOPY  4/95  ? w/D&C  ? TEE WITHOUT CARDIOVERSION N/A 10/20/2018  ? Procedure: TRANSESOPHAGEAL ECHOCARDIOGRAM (TEE);  Surgeon: Buford Dresser, MD;  Location: [place];  Service: Cardiovascular;  Laterality: N/A;  ? ? ?Current Medications: ?Current Meds  ?Medication Sig  ? apixaban (ELIQUIS) 2.5 MG TABS tablet Take 1 tablet (2.5 mg total) by mouth 2 (two) times daily.  ? aspirin 81 MG chewable tablet Chew 81 mg by mouth daily.  ? Biotin 5000 MCG TABS Take 5,000 mcg by mouth daily.  ? CALCIUM 600 1500 (600 Ca) MG TABS tablet SMARTSIG:1 By Mouth  ? Calcium-Magnesium-Vitamin D (CALCIUM 1200+D3 PO) Take 1 tablet by mouth  in the morning and at bedtime.  ? doxycycline (VIBRAMYCIN) 100 MG capsule Take 100 mg by mouth in the morning and at bedtime.  ? flecainide (TAMBOCOR) 150 MG tablet Take 150 mg by mouth 2 (two) times daily.  ? furosemide (LASIX) 20 MG tablet Take 1 tablet (20 mg total) by mouth daily.  ? metoprolol succinate (TOPROL XL) 25  MG 24 hr tablet Take 0.5 tablets (12.5 mg total) by mouth at bedtime.  ? Multiple Vitamins-Minerals (CENTRUM SILVER 50+MEN) TABS SMARTSIG:1 By Mouth  ? Omega-3 Fatty Acids (FISH OIL) 1200 MG CAPS Take 1,200 mg by mouth in the morning and at bedtime.  ? simvastatin (ZOCOR) 20 MG tablet Take 20 mg by mouth every evening.  ?  ? ?Allergies:   Atorvastatin, Crab [shellfish allergy], Penicillins, Amlodipine, and Sulfa antibiotics  ? ?Social History  ? ?Socioeconomic History  ? Marital status: Widowed  ?  Spouse name: Not on file  ? Number of children: 2  ? Years of education: Not on file  ? Highest education level: Not on file  ?Occupational History  ? Not on file  ?Tobacco Use  ? Smoking status: Former  ?  Packs/day: 0.25  ?  Years: 12.00  ?  Pack years: 3.00  ?  Types: Cigarettes  ?  Start date: 01/16/1957  ?  Quit date: 01/23/1968  ?  Years since quitting: 53.3  ? Smokeless tobacco: Never  ?Vaping Use  ? Vaping Use: Never used  ?Substance and Sexual Activity  ? Alcohol use: Yes  ?  Comment: social  ? Drug use: No  ? Sexual activity: Not Currently  ?  Partners: Male  ?  Birth control/protection: Post-menopausal  ?Other Topics Concern  ? Not on file  ?Social History Narrative  ? Not on file  ? ?Social Determinants of Health  ? ?Financial Resource Strain: Not on file  ?Food Insecurity: Not on file  ?Transportation Needs: Not on file  ?Physical Activity: Not on file  ?Stress: Not on file  ?Social Connections: Not on file  ?  ? ?Family History: ?[name] family history includes Breast cancer in her maternal grandmother; COPD in her father; Diabetes in her maternal aunt, maternal aunt, maternal grandfather, and son; Heart disease in her father; Hypertension in her mother; Kidney cancer in her brother; Rheum arthritis in her brother.   ? ?ROS:   ?Please see the history of present illness.    ?All other systems reviewed and are negative.  ? ?EKGs/Labs/Other Studies Reviewed:   ? ?The following studies were reviewed  today: ? ?Echo 06/2020 ? 1. Left ventricular ejection fraction, by estimation, is 60 to 65%. The  ?left ventricle has normal function. The left ventricle has no regional  ?wall motion abnormalities. There is mild asymmetric left ventricular  ?hypertrophy of the basal-septal segment.  ?Left ventricular diastolic parameters are indeterminate.  ? 2. Right ventricular systolic function is normal. The right ventricular  ?size is normal. There is normal pulmonary artery systolic pressure. The  ?estimated right ventricular systolic pressure is 62.9 mmHg.  ? 3. Left atrial size was moderately dilated.  ? 4. The mitral valve is degenerative. Trivial mitral valve regurgitation.  ?Moderate to severe mitral annular calcification.  ? 5. The aortic valve is tricuspid. There is mild calcification of the  ?aortic valve. There is mild thickening of the aortic valve. Aortic valve  ?regurgitation is not visualized. Aortic valve sclerosis/calcification is  ?present, without any evidence of  ?aortic stenosis.  ? 6.  The inferior vena cava is normal in size with greater than 50%  ?respiratory variability, suggesting right atrial pressure of 3 mmHg.  ? ?Comparison(s): No significant change from prior study.  ? ?EKG:  EKG is ordered today.  The ekg ordered today demonstrates NSR ? ?Recent Labs: ?11/03/2020: TSH 3.71 ?03/23/2021: NT-Pro BNP 1,089 ?05/08/2021: ALT 13; BUN 27; Creatinine, Ser 0.89; Hemoglobin 12.1; Platelets 320.0; Potassium 4.3; Sodium 140  ?Recent Lipid Panel ?No results found for: CHOL, TRIG, HDL, CHOLHDL, VLDL, LDLCALC, LDLDIRECT ? ? ?Physical Exam:   ? ?VS:  BP 124/64   Pulse (!) 59   Ht 5' 3.5" (1.613 m)   Wt 129 lb (58.5 kg)   LMP 01/23/1988 (Approximate)   SpO2 95%   BMI 22.49 kg/m?    ? ?Wt Readings from Last 3 Encounters:  ?05/11/21 129 lb (58.5 kg)  ?05/08/21 127 lb (57.6 kg)  ?04/13/21 131 lb 12.8 oz (59.8 kg)  ?  ? ?GEN:  Well nourished, well developed in no acute distress ?HEENT: Normal ?NECK: No JVD; No  carotid bruits ?LYMPHATICS: No lymphadenopathy ?CARDIAC: RRR, no murmurs, rubs, gallops ?RESPIRATORY:  Clear to auscultation without rales, wheezing or rhonchi  ?ABDOMEN: Soft, non-tender, non-distended ?MUSCULOSKELETAL:  No ed[place]

## 2021-05-12 ENCOUNTER — Telehealth: Payer: Self-pay | Admitting: Pulmonary Disease

## 2021-05-12 DIAGNOSIS — J849 Interstitial pulmonary disease, unspecified: Secondary | ICD-10-CM

## 2021-05-12 MED ORDER — SULFAMETHOXAZOLE-TRIMETHOPRIM 800-160 MG PO TABS: 1.0000 | ORAL_TABLET | ORAL | 1 refills | Status: DC

## 2021-05-12 MED ORDER — PREDNISONE 20 MG PO TABS: 40.0000 mg | ORAL_TABLET | Freq: Every day | ORAL | 1 refills | Status: DC

## 2021-05-12 NOTE — Telephone Encounter (Signed)
Noted  

## 2021-05-12 NOTE — Telephone Encounter (Signed)
I called patient's daughter to let them know the inflammatory panel is unrevealing.  ? ?We discussed options of moving forward with bronchoscopy or empirically starting steroids.  ? ?They will call back and let us know decision. ? ?Freda Jackson, MD ?[facility] ?Office: [phone] ? ? ?See Amion for personal pager ?PCCM on call pager [phone] until 7pm. ?Please call Elink 7p-7a. [phone] ? ?

## 2021-05-12 NOTE — Telephone Encounter (Signed)
She is to start prednisone '40mg'$  per day for the next month.  ? ?She is to continue the prednisone until our follow up visit next month and we will determine further instructions on the prednisone dose at that time.  ? ?Side effects of prednisone include: elevated blood sugars, insomnia, mood changes, increase fluid retention, sensitivity to the sun, increased appetite and weight gain.  ? ?She will need to be on a prophylactic antibiotic after she has been on the prednisone for 2 weeks. She is to take Bactrim DS 1 tab M-W-F in [redacted] weeks along with the prednisone. She is to continue on the antibiotic until we see her again in clinic. Bactrim is a sulfa drug which is listed on her allergy list, but it does not appear to be known why it is listed.  ? ?I have placed one more lab order if they could come back to clinic this afternoon or on Monday to have the lab draw completed. The lab is to check for medication related inflammatory disease. ? ?Prescriptions have been sent in.  ? ?Follow up is on 5/17 as scheduled already. ? ? ?I spoke with patient and daughter. Nothing further needed.  ? ?Thanks, ?JD ?

## 2021-05-15 ENCOUNTER — Other Ambulatory Visit: Payer: Medicare PPO

## 2021-05-15 DIAGNOSIS — J849 Interstitial pulmonary disease, unspecified: Secondary | ICD-10-CM

## 2021-05-16 ENCOUNTER — Telehealth: Payer: Self-pay | Admitting: Nurse Practitioner

## 2021-05-16 NOTE — Telephone Encounter (Addendum)
Pt made aware that reported HRs are WNL ?She reports she usually runs in the 50-60s. ?She did have some dizziness this morning, but nothing other than that. ?She is on her 4th day of Prednisone, and understands this may be the culprit to her higher HRs. ? ?Advised to take her BP & HR if she has any symptoms. ?Aware I will discuss w/ MD and let her know recommendation. ? ?Patient verbalized understanding and agreeable to plan.  ? ?

## 2021-05-16 NOTE — Telephone Encounter (Signed)
STAT if HR is under 50 or over 120 ?(normal HR is 60-100 beats per minute) ? ?What is your heart rate? 98  ? ?Do you have a log of your heart rate readings (document readings)? 79, 98 ? ?Do you have any other symptoms? Dizzy.  Her BP is 119/68 and 100/56.  She was recently diagnosed with an infection in her lungs. Patient is concerned about her pulse that it is high.    ?

## 2021-05-17 ENCOUNTER — Telehealth: Payer: Self-pay | Admitting: Physician Assistant

## 2021-05-17 NOTE — Telephone Encounter (Signed)
Paged by answering service.  Patient is dealing with dizziness and soft blood pressure since started on prednisone.  Most recent blood pressure was 79/52 at heart rate of 92 bpm.  I have advised to hold p.m. dose of metoprolol.  Drink plenty of water.  She will go to ER if worsening symptoms with syncope. ?

## 2021-05-17 NOTE — Telephone Encounter (Signed)
Left message to call office tomorrow.  Left message if she needs assistance tonight to call office and ask operator to contact on call provider.  ?

## 2021-05-17 NOTE — Telephone Encounter (Signed)
STAT if patient feels like he/she is going to faint  ? ?Are you dizzy now?  ?Yes ? ?Do you feel faint or have you passed out?  ?No  ? ?Do you have any other symptoms?  ?No, patient is following up to report readings for today. BP is still low and patient states she is a little dizzy. ? ?Have you checked your HR and BP (record if available)?  ?77/55 87  ?79/59 97 ?

## 2021-05-18 LAB — ANTI-SMITH ANTIBODY: ENA SM Ab Ser-aCnc: <1 AI

## 2021-05-18 LAB — HYPERSENSITIVITY PNUEMONITIS PROFILE
ASPERGILLUS FUMIGATUS: NEGATIVE
Faenia retivirgula: NEGATIVE
Pigeon Serum: NEGATIVE
S. VIRIDIS: NEGATIVE
T. CANDIDUS: NEGATIVE
T. VULGARIS: NEGATIVE

## 2021-05-18 LAB — RNP ANTIBODY: Ribonucleic Protein(ENA) Antibody, IgG: <1 AI

## 2021-05-18 LAB — SJOGRENS SYNDROME-B EXTRACTABLE NUCLEAR ANTIBODY: SSB (La) (ENA) Antibody, IgG: <1 AI

## 2021-05-18 LAB — ANTI-DNA ANTIBODY, DOUBLE-STRANDED: ds DNA Ab: <1 IU/mL

## 2021-05-18 LAB — ANTI-NUCLEAR AB-TITER (ANA TITER): ANA Titer 1: 1:80 {titer} — ABNORMAL HIGH

## 2021-05-18 LAB — ANA: Anti Nuclear Antibody (ANA): POSITIVE — AB

## 2021-05-18 LAB — RHEUMATOID FACTOR: Rheumatoid fact SerPl-aCnc: <14 IU/mL (ref <14)

## 2021-05-18 LAB — ANTI-SCLERODERMA ANTIBODY: Scleroderma (Scl-70) (ENA) Antibody, IgG: <1 AI

## 2021-05-18 LAB — HISTONE ANTIBODIES, IGG, BLOOD: Histone Antibodies: <1 U (ref <1.0)

## 2021-05-18 LAB — SJOGRENS SYNDROME-A EXTRACTABLE NUCLEAR ANTIBODY: SSA (Ro) (ENA) Antibody, IgG: <1 AI

## 2021-05-18 NOTE — Telephone Encounter (Signed)
Patient is calling wanting to report he BP today is 116/52 HR 66. Was advised by Vin to not take metoprolol last night and did not. Is wanting to know what she should do tonight. Reports she is still a little dizzy, but not like she was yesterday. States she has been drinking more water as advised by Vin as well. Wanting to know why she is still dizzy. Please advise.  ? ? ?

## 2021-05-18 NOTE — Telephone Encounter (Signed)
Discussed with Robbie Lis, PA. ?Advised pt to continue to hold Metoprolol for now. ?Advised to call office if HRs climb above 100 and/or she is symptomatic, even if HR is 90. ?Explained I was going to review her case more with MD next week and let her know what he recommends. ?Patient verbalized understanding and agreeable to plan.  ? ?

## 2021-05-18 NOTE — Telephone Encounter (Signed)
Patient spoke with on call provider last evening.  See phone note from 05/17/21 ?

## 2021-05-21 ENCOUNTER — Encounter: Payer: Self-pay | Admitting: Pulmonary Disease

## 2021-05-24 NOTE — Telephone Encounter (Signed)
Outreach made to Pt. ? ?Confirmed with Pt that she is holding her metoprolol at this time pending further discussions with Dr. Curt Bears and his nurse. ? ?She continues to hold metoprolol.  She is taking her other medications as ordered. ? ?She is not symptomatic with HR 100.   ? ?She was just concerned it was that high, her normal is much lower.  Explained that normal heart rate range is 60-100.   ? ?She feels better after further education.  Advised she should hear soon from Dr. Rod Mae. ?

## 2021-05-29 NOTE — Telephone Encounter (Signed)
Left message to call back ? ? ?(Per Dr. Curt Bears: stop metoprolol, start Diltiazem 120 mg once daily) ?

## 2021-05-31 NOTE — Telephone Encounter (Signed)
Pt aware of recommendation. ?Aware there is an interaction with Diltiazem and Simvastatin. ?Aware I would further discuss this w/ our pharmD and get back with her. ?Aware it may be next week before return call. ?Patient verbalized understanding and agreeable to plan.  ? ?

## 2021-06-01 MED ORDER — ROSUVASTATIN CALCIUM 20 MG PO TABS: 20.0000 mg | ORAL_TABLET | Freq: Every day | ORAL | 0 refills | Status: DC

## 2021-06-01 NOTE — Telephone Encounter (Signed)
Dr. Curt Bears will need to switch statin d/t interaction b/t Diltiazem and Simvastatin. ?Please advise ?

## 2021-06-01 NOTE — Telephone Encounter (Signed)
Left message to call back  

## 2021-06-01 NOTE — Telephone Encounter (Signed)
Pt aware of recommendations and agrees with plan ./cy 

## 2021-06-01 NOTE — Telephone Encounter (Signed)
Patient is calling upset she didn't get a call back. Calling to see what she needs to do. Please advise ?

## 2021-06-06 DIAGNOSIS — Z5181 Encounter for therapeutic drug level monitoring: Secondary | ICD-10-CM | POA: Diagnosis not present

## 2021-06-07 ENCOUNTER — Ambulatory Visit: Payer: Medicare PPO | Admitting: Pulmonary Disease

## 2021-06-07 ENCOUNTER — Encounter: Payer: Self-pay | Admitting: Pulmonary Disease

## 2021-06-07 ENCOUNTER — Ambulatory Visit (INDEPENDENT_AMBULATORY_CARE_PROVIDER_SITE_OTHER): Payer: Medicare PPO

## 2021-06-07 VITALS — BP 132/70 | HR 76 | Ht 63.5 in | Wt 128.0 lb

## 2021-06-07 DIAGNOSIS — R918 Other nonspecific abnormal finding of lung field: Secondary | ICD-10-CM | POA: Diagnosis not present

## 2021-06-07 DIAGNOSIS — Z8701 Personal history of pneumonia (recurrent): Secondary | ICD-10-CM | POA: Diagnosis not present

## 2021-06-07 DIAGNOSIS — J849 Interstitial pulmonary disease, unspecified: Secondary | ICD-10-CM | POA: Diagnosis not present

## 2021-06-07 NOTE — Progress Notes (Signed)
? ?Synopsis: Referred in April 2023 for cough by Yaakov Guthrie, MD ? ?Subjective:  ? ?PATIENT ID: Anna Mathews GENDER: female DOB: 16-Aug-1940, MRN: [id number] ? ?HPI ? ?Chief Complaint  ?Patient presents with  ? Follow-up  ?  1 mo f/u after starting prednisone and Bactrim.   ? ?Anna Mathews is an 81 year old woman, former smoker with atrial fibrillation, GERD, hiatal hernia, and hypertension who returns to pulmonary clinic for cough.  ? ?She was started on high dose prednisone taper on 05/12/21 empirically for concern of organizing pneumonia vs pneumonitis related to hydralazine exposure.  ? ?She has been on '40mg'$  prednisone and is feeling much better. Her fatigue has resolved and she denies cough. She has mild dyspnea but overal much improved. She has started taking bactrim DS 3 times per week for pneumocystis prophylaxis.  ? ?OV 05/08/21 ?She reports having shortness of breath since the beginning of the year which progressed in mid to late February. She was seeing her cardiology team and her blood pressure medications were being titrated at that time. There was concern she had drug induced rash secondary to amlodipine but it seems that the rash persisted despite switching off this medication. 1/13 for hypertensive urgency but no chest imaging performed at that time. She was given '15mg'$  of IV hydralazine in the ED on 02/03/21.  ? ?She has been adjusting many different antihypertensive agents with the cardiology team. She was started on hydralazine '10mg'$  BID on 2/7 then called the cardiology office 2/10 with reports of redness on her arms/legs with a burning sensation. The dose was reduced then ultimately stopped on 2/13.  ? ?She continued to have progressive dyspnea in early March. She was started on lasix at that time with no improvement. A chest x-ray 03/28/21 was obtained which showed which showed right lower lobe and left perihilar opacities concerning for pneumonia. She was treated with doxycyline without much  improvement. She continued to feel weak with low energy. She was given levaquin by her primary care on 3/23 which did not help improve her symptoms. She had x-ray 3/28 which showed progressive multifocal infiltrates. She was then treated with doxycycline again with no improvement. X ray 4/4 showed improvement of opacities on right but persistent left sided opacities.  ? ?CT Chest 4/11 showed multiple ground glass opacities in both upper lobes and right lower lobe.  ? ?She continues to have dyspnea, dry cough and fatigue. She has intermittent nausea. She has lost 12-15lbs since last fall. She denies any joint pains and no other skin rashes.  ? ?She quit smoking in 1970. Denies any harmful occupational exposures. Her father had emphysema.  ? ?Past Medical History:  ?Diagnosis Date  ? Atrial fibrillation ([place]) 09/2009  ? Breast cancer ([place])   ? Diverticulosis 09/2006  ? GERD (gastroesophageal reflux disease)   ? Hiatal hernia   ? Hyperlipidemia   ? Hypertension   ? Osteopenia   ? Postmenopausal hormone replacement therapy 1989 - 07/2000  ?  ? ?Family History  ?Problem Relation Age of Onset  ? Hypertension Mother   ? Heart disease Father   ? COPD Father   ? Diabetes Maternal Grandfather   ? Diabetes Son   ? Rheum arthritis Brother   ? Kidney cancer Brother   ? Breast cancer Maternal Grandmother   ? Diabetes Maternal Aunt   ? Diabetes Maternal Aunt   ?  ? ?Social History  ? ?Socioeconomic History  ? Marital status: Widowed  ?  Spouse name: Not on file  ? Number of children: 2  ? Years of education: Not on file  ? Highest education level: Not on file  ?Occupational History  ? Not on file  ?Tobacco Use  ? Smoking status: Former  ?  Packs/day: 0.25  ?  Years: 12.00  ?  Pack years: 3.00  ?  Types: Cigarettes  ?  Start date: 01/16/1957  ?  Quit date: 01/23/1968  ?  Years since quitting: 53.4  ? Smokeless tobacco: Never  ?Vaping Use  ? Vaping Use: Never used  ?Substance and Sexual Activity  ? Alcohol use: Yes  ?  Comment: social   ? Drug use: No  ? Sexual activity: Not Currently  ?  Partners: Male  ?  Birth control/protection: Post-menopausal  ?Other Topics Concern  ? Not on file  ?Social History Narrative  ? Not on file  ? ?Social Determinants of Health  ? ?Financial Resource Strain: Not on file  ?Food Insecurity: Not on file  ?Transportation Needs: Not on file  ?Physical Activity: Not on file  ?Stress: Not on file  ?Social Connections: Not on file  ?Intimate Partner Violence: Not on file  ?  ? ?Allergies  ?Allergen Reactions  ? Atorvastatin Other (See Comments)  ?  Other reaction(s): messed with her liver  ? Crab [Shellfish Allergy] Itching  ?  Full body itching  ? Penicillins Other (See Comments)  ?  REACTION: "ITCHING IN EYES" ?Has patient had a PCN reaction causing immediate rash, facial/tongue/throat swelling, SOB or lightheadedness with hypotension: Unknown ?Has patient had a PCN reaction causing severe rash involving mucus membranes or skin necrosis: Unknown ?Has patient had a PCN reaction that required hospitalization: No ?Has patient had a PCN reaction occurring within the last 10 years: No ?If all of the above answers are "NO", then may proceed with Cephalosporin use. ?  ? Amlodipine Itching  ? Sulfa Antibiotics Other (See Comments)  ?  REACTION: " NOT SURE" ?Other reaction(s): eyes itch  ?  ? ?Outpatient Medications Prior to Visit  ?Medication Sig Dispense Refill  ? apixaban (ELIQUIS) 2.5 MG TABS tablet Take 1 tablet (2.5 mg total) by mouth 2 (two) times daily. 60 tablet 3  ? aspirin 81 MG chewable tablet Chew 81 mg by mouth daily.    ? Biotin 5000 MCG TABS Take 5,000 mcg by mouth daily.    ? CALCIUM 600 1500 (600 Ca) MG TABS tablet SMARTSIG:1 By Mouth    ? Calcium-Magnesium-Vitamin D (CALCIUM 1200+D3 PO) Take 1 tablet by mouth in the morning and at bedtime.    ? doxycycline (VIBRAMYCIN) 100 MG capsule Take 100 mg by mouth in the morning and at bedtime.    ? flecainide (TAMBOCOR) 150 MG tablet Take 150 mg by mouth 2 (two) times  daily.    ? furosemide (LASIX) 20 MG tablet Take 1 tablet (20 mg total) by mouth daily. 30 tablet 6  ? Multiple Vitamins-Minerals (CENTRUM SILVER 50+MEN) TABS SMARTSIG:1 By Mouth    ? Omega-3 Fatty Acids (FISH OIL) 1200 MG CAPS Take 1,200 mg by mouth in the morning and at bedtime.    ? predniSONE (DELTASONE) 20 MG tablet Take 2 tablets (40 mg total) by mouth daily with breakfast. 60 tablet 1  ? rosuvastatin (CRESTOR) 20 MG tablet Take 1 tablet (20 mg total) by mouth daily. 90 tablet 0  ? sulfamethoxazole-trimethoprim (BACTRIM DS) 800-160 MG tablet Take 1 tablet by mouth 3 (three) times a week. 12 tablet 1  ? ?  No facility-administered medications prior to visit.  ? ?Review of Systems  ?Constitutional:  Negative for chills, fever, malaise/fatigue and weight loss.  ?HENT:  Negative for congestion, sinus pain and sore throat.   ?Eyes: Negative.   ?Respiratory:  Positive for shortness of breath. Negative for cough, hemoptysis, sputum production and wheezing.   ?Cardiovascular:  Negative for chest pain, palpitations, orthopnea, claudication and leg swelling.  ?Gastrointestinal:  Negative for abdominal pain, heartburn, nausea and vomiting.  ?Genitourinary: Negative.   ?Musculoskeletal:  Negative for joint pain and myalgias.  ?Skin:  Negative for rash.  ?Neurological:  Negative for weakness.  ?Endo/Heme/Allergies: Negative.   ?Psychiatric/Behavioral: Negative.    ? ?Objective:  ? ?Vitals:  ? 06/07/21 1129  ?BP: 132/70  ?Pulse: 76  ?SpO2: 97%  ?Weight: 128 lb (58.1 kg)  ?Height: 5' 3.5" (1.613 m)  ? ?Physical Exam ?Constitutional:   ?   General: She is not in acute distress. ?   Appearance: She is not ill-appearing.  ?HENT:  ?   Head: Normocephalic and atraumatic.  ?Eyes:  ?   General: No scleral icterus. ?   Conjunctiva/sclera: Conjunctivae normal.  ?Cardiovascular:  ?   Rate and Rhythm: Normal rate and regular rhythm.  ?   Pulses: Normal pulses.  ?   Heart sounds: Normal heart sounds. No murmur heard. ?Pulmonary:  ?    Effort: Pulmonary effort is normal.  ?   Breath sounds: No wheezing, rhonchi or rales.  ?Musculoskeletal:  ?   Right lower leg: No edema.  ?   Left lower leg: No edema.  ?Skin: ?   General: Skin is warm and dry.  ?

## 2021-06-07 NOTE — Patient Instructions (Signed)
Prednisone Taper Instructions: ?'40mg'$  daily through 6/2 ?'30mg'$  daily 6/3 to 7/1 ?'20mg'$  daily 7/2 to 7/30 ?'10mg'$  daily 7/31 to 8/28 ?'5mg'$  daily 8/29 to 9/26 ? ?We will stop bactrim therapy on 7/31 ? ?Follow up in 1 month ?

## 2021-06-13 ENCOUNTER — Telehealth: Payer: Self-pay | Admitting: Pulmonary Disease

## 2021-06-13 ENCOUNTER — Telehealth: Payer: Self-pay | Admitting: Cardiology

## 2021-06-13 NOTE — Telephone Encounter (Signed)
Called and spoke with patient. She stated that she had a reaction to her Crestor today and notified her cardiologist. She was due to take her Bactrim today but wanted to know if it would be safe to take with her reacting to the Crestor. I advised her to skip the dose today and start tomorrow depending on how she feels. She verbalized understanding.   Nothing further needed at time of call.

## 2021-06-13 NOTE — Telephone Encounter (Signed)
Pt c/o dizziness after taking Rosuvastatin Pt not going to take anymore Will let Dr Curt Bears know .Adonis Housekeeper

## 2021-06-13 NOTE — Telephone Encounter (Signed)
Pt c/o medication issue:  1. Name of Medication:  rosuvastatin (CRESTOR) 20 MG tablet  2. How are you currently taking this medication (dosage and times per day)?   3. Are you having a reaction (difficulty breathing--STAT)?   4. What is your medication issue?   Patient states she started Rosuvastin 20 MG today, as advised, but it caused her to become very dizzy. She states it also caused her urine to become bubble. Patient states she will never take it again due to the side effects it caused. She states she has also been taking sulfamethoxazole-trimethoprim (BACTRIM DS) 800-160 MG tablet 3 times weekly and is supposed to take a dose today, but would like to know if she should skip today and proceed with taking it tomorrow due to already having issues with medication. Please advise.

## 2021-06-14 ENCOUNTER — Encounter: Payer: Self-pay | Admitting: Pulmonary Disease

## 2021-06-14 DIAGNOSIS — R42 Dizziness and giddiness: Secondary | ICD-10-CM

## 2021-06-14 NOTE — Telephone Encounter (Signed)
Lab orders placed and pt notified. Nothing further needed at this time.

## 2021-06-14 NOTE — Telephone Encounter (Signed)
Dr. Erin Fulling please advise on the following My Chart message:   Anna Mathews [facility] (supporting Anna Starr, MD) 1 hour ago (10:23 AM)   I am experiencing dizziness that is affects me being able to drive, etc that I forgot to mention at our last visit. Could it be caused by the prednisone? If so- could we start the tapering earlier?   Also what was the exact medication that caused the lung inflammation so I can have it in writing to refer back to it later.    Thank you.   Thank you

## 2021-06-15 NOTE — Telephone Encounter (Signed)
Spent almost 33 minutes on the phone with patient. States she is not taking Crestor again -- reports SE occurred, she "became red hot and peed bubbles", her "arms and ankles started to sting".  She is not going to take this or any statin for that matter. She states that her pulmonologist told her to stop simvastatin b/c it is what cause her lung issues.  Informed pt that I do not see this documented by her pulmonologist but will follow up with up with them on this matter.  Aware I will send this to her primary cardiologist as well (after hearing back from pulmonology) about cholesterol control if needed and ASA.  Informed that since she is taking Eliquis for her PAF then I am not sure she still needs to be taking ASA as well. HRs controlled currently according to pt and avg 70s. She is not taking anything for HR control currently, never started/received Diltiazem that was recommended several weeks ago. She does have Toprol still at home but she is not taking it. Pt advised to only continue taking her Flecainide and Elqius for now and I  will discuss rate control w/ Dr. Curt Bears.  Aware it may be next week before return call on this. Advised to call office if HRs become a problem again prior to my return call. Patient verbalized understanding and agreeable to plan.

## 2021-06-23 NOTE — Telephone Encounter (Signed)
Dr. Erin Fulling -- can you please let me know your thoughts on the following pt statement --- She states that her pulmonologist told her to stop simvastatin b/c it is what cause her lung issues.  Informed pt that I do not see this documented by her pulmonologist but will follow up with up with them on this matter.  Thanks Burdett Pinzon RN   (Dr. Curt Bears nurse)

## 2021-06-25 NOTE — Telephone Encounter (Signed)
Hi Sherri,   I did not discuss statin therapy with this patient. I believe her lung injury is from her exposure to hydralazine as the most likely culprit. I will leave the statin decision to her cardiology team.   Thanks, JD

## 2021-07-04 ENCOUNTER — Other Ambulatory Visit: Payer: Self-pay | Admitting: Pulmonary Disease

## 2021-07-04 DIAGNOSIS — J849 Interstitial pulmonary disease, unspecified: Secondary | ICD-10-CM

## 2021-07-05 ENCOUNTER — Other Ambulatory Visit (INDEPENDENT_AMBULATORY_CARE_PROVIDER_SITE_OTHER): Payer: Medicare PPO

## 2021-07-05 DIAGNOSIS — R42 Dizziness and giddiness: Secondary | ICD-10-CM | POA: Diagnosis not present

## 2021-07-05 LAB — COMPREHENSIVE METABOLIC PANEL
ALT: 20 U/L (ref 0–35)
AST: 14 U/L (ref 0–37)
Albumin: 4 g/dL (ref 3.5–5.2)
Alkaline Phosphatase: 46 U/L (ref 39–117)
BUN: 30 mg/dL — ABNORMAL HIGH (ref 6–23)
CO2: 29 mEq/L (ref 19–32)
Calcium: 9.4 mg/dL (ref 8.4–10.5)
Chloride: 101 mEq/L (ref 96–112)
Creatinine, Ser: 1.19 mg/dL (ref 0.40–1.20)
GFR: 42.59 mL/min — ABNORMAL LOW (ref >60.00)
Glucose, Bld: 231 mg/dL — ABNORMAL HIGH (ref 70–99)
Potassium: 4.3 mEq/L (ref 3.5–5.1)
Sodium: 139 mEq/L (ref 135–145)
Total Bilirubin: 0.7 mg/dL (ref 0.2–1.2)
Total Protein: 6.4 g/dL (ref 6.0–8.3)

## 2021-07-06 ENCOUNTER — Other Ambulatory Visit: Payer: Self-pay | Admitting: Interventional Cardiology

## 2021-07-06 ENCOUNTER — Other Ambulatory Visit: Payer: Self-pay | Admitting: Pulmonary Disease

## 2021-07-06 DIAGNOSIS — J849 Interstitial pulmonary disease, unspecified: Secondary | ICD-10-CM

## 2021-07-06 NOTE — Telephone Encounter (Signed)
Attempted to reach pt to discuss further

## 2021-07-07 DIAGNOSIS — H5203 Hypermetropia, bilateral: Secondary | ICD-10-CM | POA: Diagnosis not present

## 2021-07-07 DIAGNOSIS — H26491 Other secondary cataract, right eye: Secondary | ICD-10-CM | POA: Diagnosis not present

## 2021-07-07 DIAGNOSIS — H2512 Age-related nuclear cataract, left eye: Secondary | ICD-10-CM | POA: Diagnosis not present

## 2021-07-07 NOTE — Telephone Encounter (Signed)
Less likely that hyperglycemia is causing the dizziness.  If she wasn't having dizziness after the hospital when she was discharged on high dose steroids, I doubt the prednisone is also causing the dizziness. Would ask her to measure her HR and blood pressure closely - low BP is the most likely culprit for dizziness. Would ask her to please keep track of these and report back to [place] and to cardiology.

## 2021-07-10 NOTE — Telephone Encounter (Signed)
Discussed further w/ pt her BP/HRs and statin therapy. Informed of pulmonology's response. Pt still VERY hesitant about taking a statin. Not sure if memory issue is a problem w/ pt but I had to repeat myself several times with pt in this 20 min phone call. Tried to explain several times that Simvastatin was not thought to be the cause of her lung issues per Dr. Erin Fulling. Pt still not wanting to take a statin.  Reports HRs are elevated from when we last spoke. This morning HR was 94 & 96, BP 106/64. Sunday pm HR 100, BP 110/56 Friday HR 79, BP 148/78  Pt aware that d/t to her continue elevated HRs she needs to see PA this week/next week to further discuss options & also statin therapy. Aware scheduler will call her to arrange OV w/ EP PA Patient verbalized understanding and agreeable to plan.

## 2021-07-12 ENCOUNTER — Ambulatory Visit: Payer: Medicare PPO | Admitting: Pulmonary Disease

## 2021-07-12 ENCOUNTER — Encounter: Payer: Self-pay | Admitting: Pulmonary Disease

## 2021-07-12 VITALS — BP 132/88 | HR 92 | Ht 63.5 in | Wt 128.0 lb

## 2021-07-12 DIAGNOSIS — J849 Interstitial pulmonary disease, unspecified: Secondary | ICD-10-CM

## 2021-07-12 NOTE — H&P (View-Only) (Signed)
PCP:  Vernie Shanks, MD Primary Cardiologist: Larae Grooms, MD Electrophysiologist: Will Meredith Leeds, MD   Anna Mathews is a 81 y.o. female seen today for Will Meredith Leeds, MD for routine electrophysiology followup.  Since last being seen in our clinic the patient reports doing overall OK from a cardiac perspective, though she has noticed an increased HR and more fatigue/SOB with exertion over the past 2-3 weeks. She had a complicated PNA in May and remains on steroid taper. She denies chest pain, syncope, or edema.  Past Medical History:  Diagnosis Date   Atrial fibrillation ([place]) 09/2009   Breast cancer ([place])    Diverticulosis 09/2006   GERD (gastroesophageal reflux disease)    Hiatal hernia    Hyperlipidemia    Hypertension    Osteopenia    Postmenopausal hormone replacement therapy 1989 - 07/2000   Past Surgical History:  Procedure Laterality Date   BREAST DUCTAL SYSTEM EXCISION Left 06/2002   duct ectasia with fibrosis - atypical lobular hyperplasia with micro calcifications - tamoxifen X 28 months then change to Evista 10/06   BREAST LUMPECTOMY WITH RADIOACTIVE SEED LOCALIZATION Right 01/27/2020   Procedure: RIGHT BREAST LUMPECTOMY WITH RADIOACTIVE SEED LOCALIZATION;  Surgeon: Coralie Keens, MD;  Location: [facility];  Service: General;  Laterality: Right;   BREAST SURGERY Left 4/99   breast biopsy - fibrosis   CARDIOVERSION N/A 07/16/2017   Procedure: CARDIOVERSION;  Surgeon: Jerline Pain, MD;  Location: [facility];  Service: Cardiovascular;  Laterality: N/A;   CARDIOVERSION N/A 10/20/2018   Procedure: CARDIOVERSION;  Surgeon: Buford Dresser, MD;  Location: [facility];  Service: Cardiovascular;  Laterality: N/A;   CATARACT EXTRACTION W/ INTRAOCULAR LENS IMPLANT Right    CESAREAN SECTION     X 2   HYSTEROSCOPY  4/95   w/D&C   TEE WITHOUT CARDIOVERSION N/A 10/20/2018   Procedure: TRANSESOPHAGEAL ECHOCARDIOGRAM (TEE);  Surgeon: Buford Dresser,  MD;  Location: [facility];  Service: Cardiovascular;  Laterality: N/A;    Current Outpatient Medications  Medication Sig Dispense Refill   apixaban (ELIQUIS) 2.5 MG TABS tablet Take 1 tablet (2.5 mg total) by mouth 2 (two) times daily. 60 tablet 3   aspirin 81 MG chewable tablet Chew 81 mg by mouth daily.     Biotin 5000 MCG TABS Take 5,000 mcg by mouth daily.     CALCIUM 600 1500 (600 Ca) MG TABS tablet SMARTSIG:1 By Mouth     Calcium-Magnesium-Vitamin D (CALCIUM 1200+D3 PO) Take 1 tablet by mouth in the morning and at bedtime.     doxycycline (VIBRAMYCIN) 100 MG capsule Take 100 mg by mouth in the morning and at bedtime.     flecainide (TAMBOCOR) 150 MG tablet Take 150 mg by mouth 2 (two) times daily.     furosemide (LASIX) 20 MG tablet TAKE 1 TABLET BY MOUTH EVERY DAY 90 tablet 3   Multiple Vitamins-Minerals (CENTRUM SILVER 50+MEN) TABS SMARTSIG:1 By Mouth     Omega-3 Fatty Acids (FISH OIL) 1200 MG CAPS Take 1,200 mg by mouth in the morning and at bedtime.     predniSONE (DELTASONE) 20 MG tablet 3 tablets daily and taper as directed 100 tablet 0   rosuvastatin (CRESTOR) 20 MG tablet Take 1 tablet (20 mg total) by mouth daily. 90 tablet 0   sulfamethoxazole-trimethoprim (BACTRIM DS) 800-160 MG tablet TAKE 1 TABLET BY MOUTH THREE TIMES A WEEK 12 tablet 0   No current facility-administered medications for this visit.    Allergies  Allergen Reactions   Atorvastatin Other (See Comments)    Other reaction(s): messed with her liver   Crab [Shellfish Allergy] Itching    Full body itching   Penicillins Other (See Comments)    REACTION: "ITCHING IN EYES" Has patient had a PCN reaction causing immediate rash, facial/tongue/throat swelling, SOB or lightheadedness with hypotension: Unknown Has patient had a PCN reaction causing severe rash involving mucus membranes or skin necrosis: Unknown Has patient had a PCN reaction that required hospitalization: No Has patient had a PCN reaction  occurring within the last 10 years: No If all of the above answers are "NO", then may proceed with Cephalosporin use.    Amlodipine Itching   Sulfa Antibiotics Other (See Comments)    REACTION: " NOT SURE" Other reaction(s): eyes itch    Social History   Socioeconomic History   Marital status: Widowed    Spouse name: Not on file   Number of children: 2   Years of education: Not on file   Highest education level: Not on file  Occupational History   Not on file  Tobacco Use   Smoking status: Former    Packs/day: 0.25    Years: 12.00    Total pack years: 3.00    Types: Cigarettes    Start date: 01/16/1957    Quit date: 01/23/1968    Years since quitting: 53.5   Smokeless tobacco: Never  Vaping Use   Vaping Use: Never used  Substance and Sexual Activity   Alcohol use: Yes    Comment: social   Drug use: No   Sexual activity: Not Currently    Partners: Male    Birth control/protection: Post-menopausal  Other Topics Concern   Not on file  Social History Narrative   Not on file   Social Determinants of Health   Financial Resource Strain: Not on file  Food Insecurity: Not on file  Transportation Needs: Not on file  Physical Activity: Not on file  Stress: Not on file  Social Connections: Not on file  Intimate Partner Violence: Not on file     Review of Systems: All other systems reviewed and are otherwise negative except as noted above.  Physical Exam: There were no vitals filed for this visit.  GEN- The patient is well appearing, alert and oriented x 3 today.   HEENT: normocephalic, atraumatic; sclera clear, conjunctiva pink; hearing intact; oropharynx clear; neck supple, no JVP Lymph- no cervical lymphadenopathy Lungs- Clear to ausculation bilaterally, normal work of breathing.  No wheezes, rales, rhonchi Heart- Regular rate and rhythm, no murmurs, rubs or gallops, PMI not laterally displaced GI- soft, non-tender, non-distended, bowel sounds present, no  hepatosplenomegaly Extremities- no clubbing, cyanosis, or edema; DP/PT/radial pulses 2+ bilaterally MS- no significant deformity or atrophy Skin- warm and dry, no rash or lesion Psych- euthymic mood, full affect Neuro- strength and sensation are intact  EKG is ordered. Personal review of EKG from today shows NSR at 92 bpm  Additional studies reviewed include: Previous EP office notes.   Assessment and Plan:  1. Paroxysmal atrial fibrillation/flutter EKG shows atrial flutter, likely atypical Continue flecainide and Toprol-XL at current doses Continue Eliquis for CHA2DS2VASC of at least 4   She may need to consider an alternate AAD, though ILD is mentioned in recent pulm notes, so likely best to avoid amiodarone.  If fails [facility], consider tikosyn VS waiting until she is off prednisone entirely (end of next month) to try again.    2. HTN Stable on  current regimen    3. HLD Continue zocor   4. High risk medication monitoring, Flecainide EKG today with atrial flutter but stable intervals otherwise. .   Follow up with  AF clinic post [facility] . If NSR at that time, would plan 6 month follow up with Dr. Ileana Ladd. If back in fib/flutter, would consider transition to tikosyn if appropriate, vs repeat [facility] once completely off prednisone for lung issues.   Anna Friar, PA-C  07/12/21 10:27 AM

## 2021-07-12 NOTE — Patient Instructions (Signed)
$'30mg'i$  daily 6/3 to 6/21  '20mg'$  daily 6/22 to 7/6 '10mg'$  daily 7/7 to 7/21 '5mg'$  daily 7/22 to 7/29 Then stop prednisone.  Continue bactrim 3 days per week until 7/7, then stop.  I believe hydralazine is the medicine that caused inflammation to your lungs  Talk with Dr. Curt Bears if he is concerned for sleep apnea contributing to your blood pressure and atrial fibrillation issues.  Follow up in August after completion of the steroid taper

## 2021-07-12 NOTE — Progress Notes (Unsigned)
Synopsis: Referred in April 2023 for cough by Yaakov Guthrie, MD  Subjective:   PATIENT ID: Anna Mathews GENDER: female DOB: Feb 12, 1940, MRN: [id number]  HPI  Chief Complaint  Patient presents with   Follow-up    1 mo f/u. States she has been doing well.    Anna Mathews is an 81 year old woman, former smoker with atrial fibrillation, GERD, hiatal hernia, and hypertension who returns to pulmonary clinic for organizing pneumonia.     OV 06/07/21 She was started on high dose prednisone taper on 05/12/21 empirically for concern of organizing pneumonia vs pneumonitis related to hydralazine exposure.   She has been on '40mg'$  prednisone and is feeling much better. Her fatigue has resolved and she denies cough. She has mild dyspnea but overal much improved. She has started taking bactrim DS 3 times per week for pneumocystis prophylaxis.   OV 05/08/21 She reports having shortness of breath since the beginning of the year which progressed in mid to late February. She was seeing her cardiology team and her blood pressure medications were being titrated at that time. There was concern she had drug induced rash secondary to amlodipine but it seems that the rash persisted despite switching off this medication. 1/13 for hypertensive urgency but no chest imaging performed at that time. She was given '15mg'$  of IV hydralazine in the ED on 02/03/21.   She has been adjusting many different antihypertensive agents with the cardiology team. She was started on hydralazine '10mg'$  BID on 2/7 then called the cardiology office 2/10 with reports of redness on her arms/legs with a burning sensation. The dose was reduced then ultimately stopped on 2/13.   She continued to have progressive dyspnea in early March. She was started on lasix at that time with no improvement. A chest x-ray 03/28/21 was obtained which showed which showed right lower lobe and left perihilar opacities concerning for pneumonia. She was treated with  doxycyline without much improvement. She continued to feel weak with low energy. She was given levaquin by her primary care on 3/23 which did not help improve her symptoms. She had x-ray 3/28 which showed progressive multifocal infiltrates. She was then treated with doxycycline again with no improvement. X ray 4/4 showed improvement of opacities on right but persistent left sided opacities.   CT Chest 4/11 showed multiple ground glass opacities in both upper lobes and right lower lobe.   She continues to have dyspnea, dry cough and fatigue. She has intermittent nausea. She has lost 12-15lbs since last fall. She denies any joint pains and no other skin rashes.   She quit smoking in 1970. Denies any harmful occupational exposures. Her father had emphysema.   Past Medical History:  Diagnosis Date   Atrial fibrillation ([place]) 09/2009   Breast cancer ([place])    Diverticulosis 09/2006   GERD (gastroesophageal reflux disease)    Hiatal hernia    Hyperlipidemia    Hypertension    Osteopenia    Postmenopausal hormone replacement therapy 1989 - 07/2000     Family History  Problem Relation Age of Onset   Hypertension Mother    Heart disease Father    COPD Father    Diabetes Maternal Grandfather    Diabetes Son    Rheum arthritis Brother    Kidney cancer Brother    Breast cancer Maternal Grandmother    Diabetes Maternal Aunt    Diabetes Maternal Aunt      Social History   Socioeconomic History  Marital status: Widowed    Spouse name: Not on file   Number of children: 2   Years of education: Not on file   Highest education level: Not on file  Occupational History   Not on file  Tobacco Use   Smoking status: Former    Packs/day: 0.25    Years: 12.00    Total pack years: 3.00    Types: Cigarettes    Start date: 01/16/1957    Quit date: 01/23/1968    Years since quitting: 53.5   Smokeless tobacco: Never  Vaping Use   Vaping Use: Never used  Substance and Sexual Activity   Alcohol  use: Yes    Comment: social   Drug use: No   Sexual activity: Not Currently    Partners: Male    Birth control/protection: Post-menopausal  Other Topics Concern   Not on file  Social History Narrative   Not on file   Social Determinants of Health   Financial Resource Strain: Not on file  Food Insecurity: Not on file  Transportation Needs: Not on file  Physical Activity: Not on file  Stress: Not on file  Social Connections: Not on file  Intimate Partner Violence: Not on file     Allergies  Allergen Reactions   Atorvastatin Other (See Comments)    Other reaction(s): messed with her liver   Crab [Shellfish Allergy] Itching    Full body itching   Penicillins Other (See Comments)    REACTION: "ITCHING IN EYES" Has patient had a PCN reaction causing immediate rash, facial/tongue/throat swelling, SOB or lightheadedness with hypotension: Unknown Has patient had a PCN reaction causing severe rash involving mucus membranes or skin necrosis: Unknown Has patient had a PCN reaction that required hospitalization: No Has patient had a PCN reaction occurring within the last 10 years: No If all of the above answers are "NO", then may proceed with Cephalosporin use.    Amlodipine Itching   Sulfa Antibiotics Other (See Comments)    REACTION: " NOT SURE" Other reaction(s): eyes itch     Outpatient Medications Prior to Visit  Medication Sig Dispense Refill   apixaban (ELIQUIS) 2.5 MG TABS tablet Take 1 tablet (2.5 mg total) by mouth 2 (two) times daily. 60 tablet 3   aspirin 81 MG chewable tablet Chew 81 mg by mouth daily.     Biotin 5000 MCG TABS Take 5,000 mcg by mouth daily.     CALCIUM 600 1500 (600 Ca) MG TABS tablet SMARTSIG:1 By Mouth     Calcium-Magnesium-Vitamin D (CALCIUM 1200+D3 PO) Take 1 tablet by mouth in the morning and at bedtime.     flecainide (TAMBOCOR) 150 MG tablet Take 150 mg by mouth 2 (two) times daily.     furosemide (LASIX) 20 MG tablet TAKE 1 TABLET BY MOUTH  EVERY DAY 90 tablet 3   Multiple Vitamins-Minerals (CENTRUM SILVER 50+MEN) TABS SMARTSIG:1 By Mouth     Omega-3 Fatty Acids (FISH OIL) 1200 MG CAPS Take 1,200 mg by mouth in the morning and at bedtime.     predniSONE (DELTASONE) 20 MG tablet 3 tablets daily and taper as directed 100 tablet 0   rosuvastatin (CRESTOR) 20 MG tablet Take 1 tablet (20 mg total) by mouth daily. 90 tablet 0   sulfamethoxazole-trimethoprim (BACTRIM DS) 800-160 MG tablet TAKE 1 TABLET BY MOUTH THREE TIMES A WEEK 12 tablet 0   doxycycline (VIBRAMYCIN) 100 MG capsule Take 100 mg by mouth in the morning and at bedtime.  No facility-administered medications prior to visit.   Review of Systems  Constitutional:  Negative for chills, fever, malaise/fatigue and weight loss.  HENT:  Negative for congestion, sinus pain and sore throat.   Eyes: Negative.   Respiratory:  Positive for shortness of breath. Negative for cough, hemoptysis, sputum production and wheezing.   Cardiovascular:  Negative for chest pain, palpitations, orthopnea, claudication and leg swelling.  Gastrointestinal:  Negative for abdominal pain, heartburn, nausea and vomiting.  Genitourinary: Negative.   Musculoskeletal:  Negative for joint pain and myalgias.  Skin:  Negative for rash.  Neurological:  Negative for weakness.  Endo/Heme/Allergies: Negative.   Psychiatric/Behavioral: Negative.      Objective:   Vitals:   07/12/21 1147  BP: 132/88  Pulse: 92  SpO2: 98%  Weight: 128 lb (58.1 kg)  Height: 5' 3.5" (1.613 m)   Physical Exam Constitutional:      General: She is not in acute distress.    Appearance: She is not ill-appearing.  HENT:     Head: Normocephalic and atraumatic.  Eyes:     General: No scleral icterus.    Conjunctiva/sclera: Conjunctivae normal.  Cardiovascular:     Rate and Rhythm: Normal rate and regular rhythm.     Pulses: Normal pulses.     Heart sounds: Normal heart sounds. No murmur heard. Pulmonary:     Effort:  Pulmonary effort is normal.     Breath sounds: No wheezing, rhonchi or rales.  Musculoskeletal:     Right lower leg: No edema.     Left lower leg: No edema.  Skin:    General: Skin is warm and dry.  Neurological:     General: No focal deficit present.     Mental Status: She is alert.  Psychiatric:        Mood and Affect: Mood normal.        Behavior: Behavior normal.        Thought Content: Thought content normal.        Judgment: Judgment normal.    CBC    Component Value Date/Time   WBC 8.0 05/08/2021 1255   RBC 4.22 05/08/2021 1255   HGB 12.1 05/08/2021 1255   HGB 12.7 06/07/2020 1549   HCT 37.2 05/08/2021 1255   HCT 37.1 06/07/2020 1549   PLT 320.0 05/08/2021 1255   PLT 222 06/07/2020 1549   MCV 88.3 05/08/2021 1255   MCV 91 06/07/2020 1549   MCH 30.3 02/03/2021 1045   MCHC 32.4 05/08/2021 1255   RDW 14.6 05/08/2021 1255   RDW 12.2 06/07/2020 1549   LYMPHSABS 1.0 05/08/2021 1255   MONOABS 0.6 05/08/2021 1255   EOSABS 0.2 05/08/2021 1255   BASOSABS 0.0 05/08/2021 1255      Latest Ref Rng & Units 07/05/2021    2:24 PM 05/08/2021   12:55 PM 03/23/2021    3:09 PM  BMP  Glucose 70 - 99 mg/dL 231  99  147   BUN 6 - 23 mg/dL '30  27  22   '$ Creatinine 0.40 - 1.20 mg/dL 1.19  0.89  0.83   BUN/Creat Ratio 12 - 28   27   Sodium 135 - 145 mEq/L 139  140  141   Potassium 3.5 - 5.1 mEq/L 4.3  4.3  4.8   Chloride 96 - 112 mEq/L 101  99  99   CO2 19 - 32 mEq/L 29  34  26   Calcium 8.4 - 10.5 mg/dL 9.4  9.6  9.1  Chest imaging: CT Chest 05/02/21 Mediastinum/Nodes: 2.4 cm left thyroid nodule is noted. Esophagus is unremarkable. Mildly enlarged precarinal adenopathy is noted which most likely is reactive or inflammatory in etiology.   Lungs/Pleura: No pneumothorax or pleural effusion is noted. Multiple ground-glass opacities are noted in both upper lobes and to some degree the right lower lobe, which may represent multifocal inflammation or sequela of prior infection.  Bilateral lower lobe opacities are noted, right greater than left, concerning for pneumonia.  PFT:     No data to display          Labs:  Path:  Echo 02/04/21: LV EF 60-65%. RV size and systolic function is normal. LA size is moderately dilated.   Heart Catheterization:  Assessment & Plan:   ILD (interstitial lung disease) ([place])  Discussion: Anna Mathews is an 81 year old woman, former smoker with atrial fibrillation, GERD, hiatal hernia, and hypertension who returns to pulmonary clinic for pneumonitis.   She is being empirically treated with high dose prednisone taper for suspected drug induced pneumonitis vs organizing pneumonia related to hydralazine exposure.  She started prednisone '40mg'$  daily on 05/12/21. She is also on bactrim DS 1 tab 3 days per week for pneumocystis prophylaxis.   She is to follow the following course for her prednisone taper:  '40mg'$  daily through 6/2 '30mg'$  daily 6/3 to 7/1 '20mg'$  daily 7/2 to 7/30 '10mg'$  daily 7/31 to 8/28 '5mg'$  daily 8/29 to 9/26  We will stop bactrim therapy on 7/31  Chest radiograph today shows improved bilateral opacities/infiltrates.   Follow up in 1 month.  Freda Jackson, MD [facility] Office: [phone]   Current Outpatient Medications:    apixaban (ELIQUIS) 2.5 MG TABS tablet, Take 1 tablet (2.5 mg total) by mouth 2 (two) times daily., Disp: 60 tablet, Rfl: 3   aspirin 81 MG chewable tablet, Chew 81 mg by mouth daily., Disp: , Rfl:    Biotin 5000 MCG TABS, Take 5,000 mcg by mouth daily., Disp: , Rfl:    CALCIUM 600 1500 (600 Ca) MG TABS tablet, SMARTSIG:1 By Mouth, Disp: , Rfl:    Calcium-Magnesium-Vitamin D (CALCIUM 1200+D3 PO), Take 1 tablet by mouth in the morning and at bedtime., Disp: , Rfl:    flecainide (TAMBOCOR) 150 MG tablet, Take 150 mg by mouth 2 (two) times daily., Disp: , Rfl:    furosemide (LASIX) 20 MG tablet, TAKE 1 TABLET BY MOUTH EVERY DAY, Disp: 90 tablet, Rfl: 3    Multiple Vitamins-Minerals (CENTRUM SILVER 50+MEN) TABS, SMARTSIG:1 By Mouth, Disp: , Rfl:    Omega-3 Fatty Acids (FISH OIL) 1200 MG CAPS, Take 1,200 mg by mouth in the morning and at bedtime., Disp: , Rfl:    predniSONE (DELTASONE) 20 MG tablet, 3 tablets daily and taper as directed, Disp: 100 tablet, Rfl: 0   rosuvastatin (CRESTOR) 20 MG tablet, Take 1 tablet (20 mg total) by mouth daily., Disp: 90 tablet, Rfl: 0   sulfamethoxazole-trimethoprim (BACTRIM DS) 800-160 MG tablet, TAKE 1 TABLET BY MOUTH THREE TIMES A WEEK, Disp: 12 tablet, Rfl: 0

## 2021-07-12 NOTE — Progress Notes (Unsigned)
PCP:  Vernie Shanks, MD Primary Cardiologist: Larae Grooms, MD Electrophysiologist: Will Meredith Leeds, MD   Anna Mathews is a 81 y.o. female seen today for Will Meredith Leeds, MD for routine electrophysiology followup.  Since last being seen in our clinic the patient reports doing ***.  she denies chest pain, palpitations, dyspnea, PND, orthopnea, nausea, vomiting, dizziness, syncope, edema, weight gain, or early satiety.  Past Medical History:  Diagnosis Date   Atrial fibrillation ([place]) 09/2009   Breast cancer ([place])    Diverticulosis 09/2006   GERD (gastroesophageal reflux disease)    Hiatal hernia    Hyperlipidemia    Hypertension    Osteopenia    Postmenopausal hormone replacement therapy 1989 - 07/2000   Past Surgical History:  Procedure Laterality Date   BREAST DUCTAL SYSTEM EXCISION Left 06/2002   duct ectasia with fibrosis - atypical lobular hyperplasia with micro calcifications - tamoxifen X 28 months then change to Evista 10/06   BREAST LUMPECTOMY WITH RADIOACTIVE SEED LOCALIZATION Right 01/27/2020   Procedure: RIGHT BREAST LUMPECTOMY WITH RADIOACTIVE SEED LOCALIZATION;  Surgeon: Coralie Keens, MD;  Location: [facility];  Service: General;  Laterality: Right;   BREAST SURGERY Left 4/99   breast biopsy - fibrosis   CARDIOVERSION N/A 07/16/2017   Procedure: CARDIOVERSION;  Surgeon: Jerline Pain, MD;  Location: [facility];  Service: Cardiovascular;  Laterality: N/A;   CARDIOVERSION N/A 10/20/2018   Procedure: CARDIOVERSION;  Surgeon: Buford Dresser, MD;  Location: [facility];  Service: Cardiovascular;  Laterality: N/A;   CATARACT EXTRACTION W/ INTRAOCULAR LENS IMPLANT Right    CESAREAN SECTION     X 2   HYSTEROSCOPY  4/95   w/D&C   TEE WITHOUT CARDIOVERSION N/A 10/20/2018   Procedure: TRANSESOPHAGEAL ECHOCARDIOGRAM (TEE);  Surgeon: Buford Dresser, MD;  Location: [facility];  Service: Cardiovascular;  Laterality: N/A;    Current Outpatient  Medications  Medication Sig Dispense Refill   apixaban (ELIQUIS) 2.5 MG TABS tablet Take 1 tablet (2.5 mg total) by mouth 2 (two) times daily. 60 tablet 3   aspirin 81 MG chewable tablet Chew 81 mg by mouth daily.     Biotin 5000 MCG TABS Take 5,000 mcg by mouth daily.     CALCIUM 600 1500 (600 Ca) MG TABS tablet SMARTSIG:1 By Mouth     Calcium-Magnesium-Vitamin D (CALCIUM 1200+D3 PO) Take 1 tablet by mouth in the morning and at bedtime.     doxycycline (VIBRAMYCIN) 100 MG capsule Take 100 mg by mouth in the morning and at bedtime.     flecainide (TAMBOCOR) 150 MG tablet Take 150 mg by mouth 2 (two) times daily.     furosemide (LASIX) 20 MG tablet TAKE 1 TABLET BY MOUTH EVERY DAY 90 tablet 3   Multiple Vitamins-Minerals (CENTRUM SILVER 50+MEN) TABS SMARTSIG:1 By Mouth     Omega-3 Fatty Acids (FISH OIL) 1200 MG CAPS Take 1,200 mg by mouth in the morning and at bedtime.     predniSONE (DELTASONE) 20 MG tablet 3 tablets daily and taper as directed 100 tablet 0   rosuvastatin (CRESTOR) 20 MG tablet Take 1 tablet (20 mg total) by mouth daily. 90 tablet 0   sulfamethoxazole-trimethoprim (BACTRIM DS) 800-160 MG tablet TAKE 1 TABLET BY MOUTH THREE TIMES A WEEK 12 tablet 0   No current facility-administered medications for this visit.    Allergies  Allergen Reactions   Atorvastatin Other (See Comments)    Other reaction(s): messed with her liver   Crab [Shellfish Allergy]  Itching    Full body itching   Penicillins Other (See Comments)    REACTION: "ITCHING IN EYES" Has patient had a PCN reaction causing immediate rash, facial/tongue/throat swelling, SOB or lightheadedness with hypotension: Unknown Has patient had a PCN reaction causing severe rash involving mucus membranes or skin necrosis: Unknown Has patient had a PCN reaction that required hospitalization: No Has patient had a PCN reaction occurring within the last 10 years: No If all of the above answers are "NO", then may proceed with  Cephalosporin use.    Amlodipine Itching   Sulfa Antibiotics Other (See Comments)    REACTION: " NOT SURE" Other reaction(s): eyes itch    Social History   Socioeconomic History   Marital status: Widowed    Spouse name: Not on file   Number of children: 2   Years of education: Not on file   Highest education level: Not on file  Occupational History   Not on file  Tobacco Use   Smoking status: Former    Packs/day: 0.25    Years: 12.00    Total pack years: 3.00    Types: Cigarettes    Start date: 01/16/1957    Quit date: 01/23/1968    Years since quitting: 53.5   Smokeless tobacco: Never  Vaping Use   Vaping Use: Never used  Substance and Sexual Activity   Alcohol use: Yes    Comment: social   Drug use: No   Sexual activity: Not Currently    Partners: Male    Birth control/protection: Post-menopausal  Other Topics Concern   Not on file  Social History Narrative   Not on file   Social Determinants of Health   Financial Resource Strain: Not on file  Food Insecurity: Not on file  Transportation Needs: Not on file  Physical Activity: Not on file  Stress: Not on file  Social Connections: Not on file  Intimate Partner Violence: Not on file     Review of Systems: All other systems reviewed and are otherwise negative except as noted above.  Physical Exam: There were no vitals filed for this visit.  GEN- The patient is well appearing, alert and oriented x 3 today.   HEENT: normocephalic, atraumatic; sclera clear, conjunctiva pink; hearing intact; oropharynx clear; neck supple, no JVP Lymph- no cervical lymphadenopathy Lungs- Clear to ausculation bilaterally, normal work of breathing.  No wheezes, rales, rhonchi Heart- Regular rate and rhythm, no murmurs, rubs or gallops, PMI not laterally displaced GI- soft, non-tender, non-distended, bowel sounds present, no hepatosplenomegaly Extremities- no clubbing, cyanosis, or edema; DP/PT/radial pulses 2+ bilaterally MS-  no significant deformity or atrophy Skin- warm and dry, no rash or lesion Psych- euthymic mood, full affect Neuro- strength and sensation are intact  EKG is ordered. Personal review of EKG from today shows ***  Additional studies reviewed include: Previous EP office notes.   Assessment and Plan:  1. Paroxysmal atrial fibrillation/flutter EKG shows *** Continue flecainide and Toprol-XL at current doses Continue Eliquis for CHA2DS2VASC of at least 4     2. HTN Stable on current regimen    3. HLD Continue zocor   4. High risk medication monitoring, Flecainide EKG today ***.   Follow up with {Blank single:19197::"Dr. Allred","Dr. Arlan Organ. Klein","Dr. Camnitz","Dr. Lambert","EP APP"} in {Blank single:19197::"2 weeks","4 weeks","3 months","6 months","12 months","as usual post gen change"}   Shirley Friar, [place]  07/12/21 10:27 AM

## 2021-07-13 ENCOUNTER — Ambulatory Visit: Payer: Medicare PPO | Admitting: Student

## 2021-07-13 ENCOUNTER — Encounter: Payer: Self-pay | Admitting: Student

## 2021-07-13 VITALS — BP 144/76 | HR 92 | Ht 63.5 in | Wt 138.8 lb

## 2021-07-13 DIAGNOSIS — I480 Paroxysmal atrial fibrillation: Secondary | ICD-10-CM | POA: Diagnosis not present

## 2021-07-13 DIAGNOSIS — I10 Essential (primary) hypertension: Secondary | ICD-10-CM | POA: Diagnosis not present

## 2021-07-13 DIAGNOSIS — Z7901 Long term (current) use of anticoagulants: Secondary | ICD-10-CM | POA: Diagnosis not present

## 2021-07-13 DIAGNOSIS — I484 Atypical atrial flutter: Secondary | ICD-10-CM | POA: Diagnosis not present

## 2021-07-15 ENCOUNTER — Encounter: Payer: Self-pay | Admitting: Pulmonary Disease

## 2021-07-17 ENCOUNTER — Encounter (HOSPITAL_COMMUNITY): Payer: Self-pay | Admitting: Cardiology

## 2021-07-26 ENCOUNTER — Encounter (HOSPITAL_COMMUNITY): Payer: Self-pay | Admitting: Cardiology

## 2021-07-26 ENCOUNTER — Encounter (HOSPITAL_COMMUNITY): Payer: Self-pay | Admitting: Anesthesiology

## 2021-07-26 ENCOUNTER — Encounter (HOSPITAL_COMMUNITY)
Admission: RE | Disposition: A | Discharge status: Home / Self Care | Payer: Self-pay | Source: Ambulatory Visit | Attending: Cardiology

## 2021-07-26 ENCOUNTER — Ambulatory Visit (HOSPITAL_COMMUNITY)
Admission: RE | Admit: 2021-07-26 | Discharge: 2021-07-26 | Disposition: A | Discharge status: Home / Self Care | Payer: Medicare PPO | Source: Ambulatory Visit | Attending: Cardiology | Admitting: Cardiology

## 2021-07-26 ENCOUNTER — Other Ambulatory Visit: Payer: Self-pay

## 2021-07-26 DIAGNOSIS — Z539 Procedure and treatment not carried out, unspecified reason: Secondary | ICD-10-CM | POA: Insufficient documentation

## 2021-07-26 DIAGNOSIS — E785 Hyperlipidemia, unspecified: Secondary | ICD-10-CM | POA: Diagnosis not present

## 2021-07-26 DIAGNOSIS — I10 Essential (primary) hypertension: Secondary | ICD-10-CM | POA: Diagnosis not present

## 2021-07-26 DIAGNOSIS — I484 Atypical atrial flutter: Secondary | ICD-10-CM

## 2021-07-26 DIAGNOSIS — Z7901 Long term (current) use of anticoagulants: Secondary | ICD-10-CM | POA: Insufficient documentation

## 2021-07-26 DIAGNOSIS — I4892 Unspecified atrial flutter: Secondary | ICD-10-CM | POA: Insufficient documentation

## 2021-07-26 DIAGNOSIS — Z79899 Other long term (current) drug therapy: Secondary | ICD-10-CM | POA: Diagnosis not present

## 2021-07-26 DIAGNOSIS — I480 Paroxysmal atrial fibrillation: Secondary | ICD-10-CM | POA: Insufficient documentation

## 2021-07-26 LAB — POCT I-STAT, CHEM 8
BUN: 41 mg/dL — ABNORMAL HIGH (ref 8–23)
Calcium, Ion: 1.13 mmol/L — ABNORMAL LOW (ref 1.15–1.40)
Chloride: 107 mmol/L (ref 98–111)
Creatinine, Ser: 1 mg/dL (ref 0.44–1.00)
Glucose, Bld: 105 mg/dL — ABNORMAL HIGH (ref 70–99)
HCT: 44 % (ref 36.0–46.0)
Hemoglobin: 15 g/dL (ref 12.0–15.0)
Potassium: 4 mmol/L (ref 3.5–5.1)
Sodium: 142 mmol/L (ref 135–145)
TCO2: 29 mmol/L (ref 22–32)

## 2021-07-26 SURGERY — CANCELLED PROCEDURE

## 2021-07-26 MED ORDER — SODIUM CHLORIDE 0.9 % IV SOLN: INTRAVENOUS | Status: DC

## 2021-07-26 NOTE — Interval H&P Note (Signed)
History and Physical Interval Note:  07/26/2021 10:59 AM  Anna Mathews  has presented today for surgery, with the diagnosis of A FLUTTER.  The various methods of treatment have been discussed with the patient and family. After consideration of risks, benefits and other options for treatment, the patient has consented to  Procedure(s): CANCELLED PROCEDURE as a surgical intervention.  [name] history has been reviewed, patient examined, no change in status, stable for surgery.  I have reviewed the patient's chart and labs.  Questions were answered to the patient's satisfaction.     [name]

## 2021-07-26 NOTE — Anesthesia Preprocedure Evaluation (Deleted)
Anesthesia Evaluation    Reviewed: Allergy & Precautions, Patient's Chart, lab work & pertinent test results  Airway        Dental   Pulmonary neg pulmonary ROS, former smoker,           Cardiovascular hypertension,      Neuro/Psych TIA Neuromuscular disease    GI/Hepatic Neg liver ROS, hiatal hernia, GERD  ,  Endo/Other  negative endocrine ROS  Renal/GU negative Renal ROS     Musculoskeletal  (+) Arthritis ,   Abdominal   Peds  Hematology negative hematology ROS (+)   Anesthesia Other Findings A-FLUTTER  Reproductive/Obstetrics                             Anesthesia Physical Anesthesia Plan Anesthesia Quick Evaluation

## 2021-07-26 NOTE — Progress Notes (Signed)
Patient converted to sinus rhythm during pre op. ECG performed and Dr Johney Frame ordered d/c.

## 2021-07-26 NOTE — Progress Notes (Signed)
Patient arrived at Endoscopy today and was noted to be in NSR. Cardioversion cancelled and patient discharged home.   Gwyndolyn Kaufman, MD

## 2021-08-01 ENCOUNTER — Other Ambulatory Visit: Payer: Self-pay | Admitting: Pulmonary Disease

## 2021-08-01 DIAGNOSIS — J849 Interstitial pulmonary disease, unspecified: Secondary | ICD-10-CM

## 2021-08-01 NOTE — Telephone Encounter (Signed)
Freddi Starr, MD to Me  L[facility] Triage [facility]      08/01/21 10:35 AM Please change prescription to '10mg'$  tablets based on her taper.   Thanks,  JD     New Rx with instructions has been sent to pharmacy for pt. Nothing further needed.

## 2021-08-01 NOTE — Telephone Encounter (Signed)
Dr. Erin Fulling, please advise on refill request and instructions on the Rx.

## 2021-08-03 ENCOUNTER — Other Ambulatory Visit: Payer: Self-pay | Admitting: Nurse Practitioner

## 2021-08-04 ENCOUNTER — Encounter (HOSPITAL_COMMUNITY): Payer: Self-pay | Admitting: Physician Assistant

## 2021-08-04 ENCOUNTER — Ambulatory Visit (HOSPITAL_COMMUNITY)
Admission: RE | Admit: 2021-08-04 | Discharge: 2021-08-04 | Disposition: A | Discharge status: Home / Self Care | Payer: Medicare PPO | Source: Ambulatory Visit | Attending: Physician Assistant | Admitting: Physician Assistant

## 2021-08-04 VITALS — BP 180/100 | HR 84 | Ht 63.0 in | Wt 140.6 lb

## 2021-08-04 DIAGNOSIS — I4892 Unspecified atrial flutter: Secondary | ICD-10-CM | POA: Insufficient documentation

## 2021-08-04 DIAGNOSIS — Z7901 Long term (current) use of anticoagulants: Secondary | ICD-10-CM | POA: Diagnosis not present

## 2021-08-04 DIAGNOSIS — I10 Essential (primary) hypertension: Secondary | ICD-10-CM | POA: Insufficient documentation

## 2021-08-04 DIAGNOSIS — E785 Hyperlipidemia, unspecified: Secondary | ICD-10-CM | POA: Diagnosis not present

## 2021-08-04 DIAGNOSIS — D6869 Other thrombophilia: Secondary | ICD-10-CM | POA: Insufficient documentation

## 2021-08-04 DIAGNOSIS — I480 Paroxysmal atrial fibrillation: Secondary | ICD-10-CM | POA: Diagnosis present

## 2021-08-04 DIAGNOSIS — J849 Interstitial pulmonary disease, unspecified: Secondary | ICD-10-CM | POA: Diagnosis not present

## 2021-08-04 MED ORDER — DILTIAZEM HCL 120 MG PO TABS: 120.0000 mg | ORAL_TABLET | Freq: Every morning | ORAL | 3 refills | Status: DC

## 2021-08-04 NOTE — Progress Notes (Signed)
Primary Care Physician: Vernie Shanks, MD (Inactive) Primary Cardiologist: Dr Irish Lack Primary Electrophysiologist: Dr Curt Bears Referring Physician: Oda Kilts PA   Anna Mathews is a 81 y.o. female with a history of HTN, HLD, ILD, atrial flutter, atrial fibrillation who presents for consultation in the [facility]. Patient is on Eliquis for a CHADS2VASC score of 4. She has been maintained on flecainide for rhythm control. She was seen 07/13/21 and found to be in atrial flutter. She presented for DCCV on 07/26/21 but had converted back to SR so the procedure was cancelled. Prior to the onset of her flutter, she had pneumonia and was on a steroid taper.   On follow up today, patient reports that about 5 days ago she went back out of rhythm with symptoms of an elevated heart rate, SOB, and intermittent dizziness. She is in atypical flutter vs coarse afib today. She is still on a prednisone taper. She is not taking any rate control.   Today, she denies symptoms of chest pain, orthopnea, PND, lower extremity edema, presyncope, syncope, snoring, daytime somnolence, bleeding, or neurologic sequela. The patient is tolerating medications without difficulties and is otherwise without complaint today.    Atrial Fibrillation Risk Factors:  she does not have symptoms or diagnosis of sleep apnea. she does not have a history of rheumatic fever.   she has a BMI of Body mass index is 24.91 kg/m.Marland Kitchen Filed Weights   08/04/21 0900  Weight: 63.8 kg    Family History  Problem Relation Age of Onset   Hypertension Mother    Heart disease Father    COPD Father    Diabetes Maternal Grandfather    Diabetes Son    Rheum arthritis Brother    Kidney cancer Brother    Breast cancer Maternal Grandmother    Diabetes Maternal Aunt    Diabetes Maternal Aunt      Atrial Fibrillation Management history:  Previous antiarrhythmic drugs: flecainide Previous cardioversions: 2019,  2020 Previous ablations: none CHADS2VASC score: 4 Anticoagulation history: Eliquis   Past Medical History:  Diagnosis Date   Atrial fibrillation ([place]) 09/2009   Breast cancer ([place])    Diverticulosis 09/2006   GERD (gastroesophageal reflux disease)    Hiatal hernia    Hyperlipidemia    Hypertension    Osteopenia    Postmenopausal hormone replacement therapy 1989 - 07/2000   Past Surgical History:  Procedure Laterality Date   BREAST DUCTAL SYSTEM EXCISION Left 06/2002   duct ectasia with fibrosis - atypical lobular hyperplasia with micro calcifications - tamoxifen X 28 months then change to Evista 10/06   BREAST LUMPECTOMY WITH RADIOACTIVE SEED LOCALIZATION Right 01/27/2020   Procedure: RIGHT BREAST LUMPECTOMY WITH RADIOACTIVE SEED LOCALIZATION;  Surgeon: Coralie Keens, MD;  Location: [place];  Service: General;  Laterality: Right;   BREAST SURGERY Left 4/99   breast biopsy - fibrosis   CARDIOVERSION N/A 07/16/2017   Procedure: CARDIOVERSION;  Surgeon: Jerline Pain, MD;  Location: [place];  Service: Cardiovascular;  Laterality: N/A;   CARDIOVERSION N/A 10/20/2018   Procedure: CARDIOVERSION;  Surgeon: Buford Dresser, MD;  Location: [place];  Service: Cardiovascular;  Laterality: N/A;   CATARACT EXTRACTION W/ INTRAOCULAR LENS IMPLANT Right    CESAREAN SECTION     X 2   HYSTEROSCOPY  4/95   w/D&C   TEE WITHOUT CARDIOVERSION N/A 10/20/2018   Procedure: TRANSESOPHAGEAL ECHOCARDIOGRAM (TEE);  Surgeon: Buford Dresser, MD;  Location: [place];  Service: Cardiovascular;  Laterality: N/A;    Current Outpatient Medications  Medication Sig Dispense Refill   apixaban (ELIQUIS) 2.5 MG TABS tablet TAKE 1 TABLET BY MOUTH TWICE A DAY 60 tablet 5   flecainide (TAMBOCOR) 150 MG tablet Take 150 mg by mouth 2 (two) times daily.     furosemide (LASIX) 20 MG tablet TAKE 1 TABLET BY MOUTH EVERY DAY 90 tablet 3   Multiple Vitamins-Minerals (CENTRUM SILVER 50+MEN) TABS       Omega-3 Fatty Acids (FISH OIL) 1200 MG CAPS Take 1,200 mg by mouth in the morning and at bedtime.     predniSONE (DELTASONE) 10 MG tablet Take 1 tablet (10 mg total) by mouth daily with breakfast for 11 days, THEN 0.5 tablets (5 mg total) daily with breakfast for 8 days. 15 tablet 0   sulfamethoxazole-trimethoprim (BACTRIM DS) 800-160 MG tablet TAKE 1 TABLET BY MOUTH THREE TIMES A WEEK (Patient taking differently: Take 1 tablet by mouth every Monday, Wednesday, and Friday.) 12 tablet 0   No current facility-administered medications for this encounter.    Allergies  Allergen Reactions   Atorvastatin Other (See Comments)    Other reaction(s): messed with her liver   Crab [Shellfish Allergy] Itching    Full body itching   Penicillins Other (See Comments)    REACTION: "ITCHING IN EYES" Has patient had a PCN reaction causing immediate rash, facial/tongue/throat swelling, SOB or lightheadedness with hypotension: Unknown Has patient had a PCN reaction causing severe rash involving mucus membranes or skin necrosis: Unknown Has patient had a PCN reaction that required hospitalization: No Has patient had a PCN reaction occurring within the last 10 years: No If all of the above answers are "NO", then may proceed with Cephalosporin use.    Amlodipine Itching   Sulfa Antibiotics Other (See Comments)    REACTION: " NOT SURE" Other reaction(s): eyes itch    Social History   Socioeconomic History   Marital status: Widowed    Spouse name: Not on file   Number of children: 2   Years of education: Not on file   Highest education level: Not on file  Occupational History   Not on file  Tobacco Use   Smoking status: Former    Packs/day: 0.25    Years: 12.00    Total pack years: 3.00    Types: Cigarettes    Start date: 01/16/1957    Quit date: 01/23/1968    Years since quitting: 53.5   Smokeless tobacco: Never   Tobacco comments:    Former smoker 08/04/21  Vaping Use   Vaping Use: Never  used  Substance and Sexual Activity   Alcohol use: Yes    Comment: social   Drug use: No   Sexual activity: Not Currently    Partners: Male    Birth control/protection: Post-menopausal  Other Topics Concern   Not on file  Social History Narrative   Not on file   Social Determinants of Health   Financial Resource Strain: Not on file  Food Insecurity: Not on file  Transportation Needs: Not on file  Physical Activity: Not on file  Stress: Not on file  Social Connections: Not on file  Intimate Partner Violence: Not on file     ROS- All systems are reviewed and negative except as per the HPI above.  Physical Exam: Vitals:   08/04/21 0900  BP: (!) 180/100  Pulse: 84  Weight: 63.8 kg  Height: '5\' 3"'$  (1.6 m)    GEN- The patient is  a well appearing elderly female, alert and oriented x 3 today.   Head- normocephalic, atraumatic Eyes-  Sclera clear, conjunctiva pink Ears- hearing intact Oropharynx- clear Neck- supple  Lungs- Clear to ausculation bilaterally, normal work of breathing Heart- irregular rate and rhythm, no murmurs, rubs or gallops  GI- soft, NT, ND, + BS Extremities- no clubbing, cyanosis, or edema MS- no significant deformity or atrophy Skin- no rash or lesion Psych- euthymic mood, full affect Neuro- strength and sensation are intact  Wt Readings from Last 3 Encounters:  08/04/21 63.8 kg  07/26/21 59 kg  07/13/21 63 kg    EKG today demonstrates  Coarse afib vs atypical atrial flutter Vent. rate 84 BPM PR interval * ms QRS duration 138 ms QT/QTcB 388/458 ms  Echo 02/04/21 demonstrated  1. Left ventricular ejection fraction, by estimation, is 60 to 65%. The  left ventricle has normal function. The left ventricle has no regional  wall motion abnormalities. There is mild asymmetric left ventricular  hypertrophy of the basal-septal segment. Left ventricular diastolic parameters are indeterminate.   2. Right ventricular systolic function is normal. The  right ventricular  size is normal. There is normal pulmonary artery systolic pressure. The  estimated right ventricular systolic pressure is 16.1 mmHg.   3. Left atrial size was moderately dilated.   4. The mitral valve is degenerative. Trivial mitral valve regurgitation.  Moderate to severe mitral annular calcification.   5. The aortic valve is tricuspid. There is mild calcification of the  aortic valve. There is mild thickening of the aortic valve. Aortic valve  regurgitation is not visualized. Aortic valve sclerosis/calcification is  present, without any evidence of  aortic stenosis.   6. The inferior vena cava is normal in size with greater than 50%  respiratory variability, suggesting right atrial pressure of 3 mmHg.   Comparison(s): No significant change from prior study.   Epic records are reviewed at length today  CHA2DS2-VASc Score = 4  [name] score is based upon: CHF History: 0 HTN History: 1 Diabetes History: 0 Stroke History: 0 Vascular Disease History: 0 Age Score: 2 Gender Score: 1       ASSESSMENT AND PLAN: 1. Paroxysmal Atrial Fibrillation/atrial flutter [name] CHA2DS2-VASc score is 4, indicating a 4.8% annual risk of stroke.   Patient appears to be going in and out of rhythm. She is not on any rate control. Will start diltiazem 120 mg daily (metoprolol made her dizzy) Continue flecainide 150 mg BID We also discussed changing AAD to dofetilide if needed, information sheet given today. She is not a good candidate for amiodarone with lung issues.  Continue Eliquis 2.5 mg BID for now, looks like her weight is now > 60 kg and her Cr is < 1.5. May need to increase dose if her weight stays consistently above 60 kg.  2. Secondary Hypercoagulable State (ICD10:  D68.69) The patient is at significant risk for stroke/thromboembolism based upon her CHA2DS2-VASc Score of 4.  Continue Apixaban (Eliquis).   3. HTN Elevated today, patient had stopped her  BB. Start diltiazem as above.    Follow up in the AF clinic in 2 weeks.    [facility] [address] [place], [place] [address] [phone] 08/04/2021 9:12 AM

## 2021-08-04 NOTE — Addendum Note (Signed)
Encounter addended by: Enid Derry, CMA on: 08/04/2021 10:19 AM  Actions taken: Order list changed

## 2021-08-04 NOTE — Telephone Encounter (Signed)
Prescription refill request for Eliquis received.  Indication: afib  Last office visit: [place], 07/13/2021 Scr: 1.00, 07/26/2021 Age: 81 yo  Weight: 59 kg   Refill sent.

## 2021-08-09 ENCOUNTER — Ambulatory Visit (HOSPITAL_COMMUNITY): Payer: Medicare PPO | Admitting: Nurse Practitioner

## 2021-08-17 ENCOUNTER — Other Ambulatory Visit (HOSPITAL_COMMUNITY): Payer: Self-pay | Admitting: *Deleted

## 2021-08-17 MED ORDER — BISOPROLOL FUMARATE 5 MG PO TABS: 5.0000 mg | ORAL_TABLET | Freq: Every day | ORAL | 3 refills | Status: DC

## 2021-08-17 NOTE — Telephone Encounter (Signed)
Left detailed message informing  pt that I was forwarding this  message to AFib clinic for review/advisement.   Informed pt they may answer her back via mychart, but if she does not hear anything to call them to discuss (advised her to give them time to respond or call her about this)

## 2021-08-18 ENCOUNTER — Ambulatory Visit (HOSPITAL_COMMUNITY)
Admission: RE | Admit: 2021-08-18 | Discharge: 2021-08-18 | Disposition: A | Discharge status: Home / Self Care | Payer: Medicare PPO | Source: Ambulatory Visit | Attending: Physician Assistant | Admitting: Physician Assistant

## 2021-08-18 VITALS — BP 118/60 | HR 83 | Ht 63.0 in | Wt 137.0 lb

## 2021-08-18 DIAGNOSIS — Z7901 Long term (current) use of anticoagulants: Secondary | ICD-10-CM | POA: Insufficient documentation

## 2021-08-18 DIAGNOSIS — E785 Hyperlipidemia, unspecified: Secondary | ICD-10-CM | POA: Insufficient documentation

## 2021-08-18 DIAGNOSIS — D6869 Other thrombophilia: Secondary | ICD-10-CM | POA: Diagnosis not present

## 2021-08-18 DIAGNOSIS — I4819 Other persistent atrial fibrillation: Secondary | ICD-10-CM | POA: Insufficient documentation

## 2021-08-18 DIAGNOSIS — I4892 Unspecified atrial flutter: Secondary | ICD-10-CM | POA: Insufficient documentation

## 2021-08-18 DIAGNOSIS — J849 Interstitial pulmonary disease, unspecified: Secondary | ICD-10-CM | POA: Insufficient documentation

## 2021-08-18 DIAGNOSIS — I484 Atypical atrial flutter: Secondary | ICD-10-CM

## 2021-08-18 DIAGNOSIS — I10 Essential (primary) hypertension: Secondary | ICD-10-CM | POA: Insufficient documentation

## 2021-08-18 MED ORDER — APIXABAN 5 MG PO TABS: 5.0000 mg | ORAL_TABLET | Freq: Two times a day (BID) | ORAL | 3 refills | Status: DC

## 2021-08-18 NOTE — Progress Notes (Signed)
Primary Care Physician: Vernie Shanks, MD (Inactive) Primary Cardiologist: Dr Irish Lack Primary Electrophysiologist: Dr Curt Bears Referring Physician: Oda Kilts PA   Anna Mathews is a 81 y.o. female with a history of HTN, HLD, ILD, atrial flutter, atrial fibrillation who presents for consultation in the [facility]. Patient is on Eliquis for a CHADS2VASC score of 4. She has been maintained on flecainide for rhythm control. She was seen 07/13/21 and found to be in atrial flutter. She presented for DCCV on 07/26/21 but had converted back to SR so the procedure was cancelled. Prior to the onset of her flutter, she had pneumonia and was on a steroid taper.   On follow up today, patient developed itching all over her body after starting diltiazem. This was discontinued and she was started on bisoprolol. Patient reports that she feels OK today but does have intermittent dizziness. She is rate controlled atrial flutter. She is scheduled to finish her prednisone tomorrow.   Today, she denies symptoms of chest pain, orthopnea, PND, lower extremity edema, presyncope, syncope, snoring, daytime somnolence, bleeding, or neurologic sequela. The patient is tolerating medications without difficulties and is otherwise without complaint today.    Atrial Fibrillation Risk Factors:  she does not have symptoms or diagnosis of sleep apnea. she does not have a history of rheumatic fever.   she has a BMI of Body mass index is 24.27 kg/m.Marland Kitchen Filed Weights   08/18/21 0942  Weight: 62.1 kg    Family History  Problem Relation Age of Onset   Hypertension Mother    Heart disease Father    COPD Father    Diabetes Maternal Grandfather    Diabetes Son    Rheum arthritis Brother    Kidney cancer Brother    Breast cancer Maternal Grandmother    Diabetes Maternal Aunt    Diabetes Maternal Aunt      Atrial Fibrillation Management history:  Previous antiarrhythmic drugs:  flecainide Previous cardioversions: 2019, 2020 Previous ablations: none CHADS2VASC score: 4 Anticoagulation history: Eliquis   Past Medical History:  Diagnosis Date   Atrial fibrillation ([place]) 09/2009   Breast cancer ([place])    Diverticulosis 09/2006   GERD (gastroesophageal reflux disease)    Hiatal hernia    Hyperlipidemia    Hypertension    Osteopenia    Postmenopausal hormone replacement therapy 1989 - 07/2000   Past Surgical History:  Procedure Laterality Date   BREAST DUCTAL SYSTEM EXCISION Left 06/2002   duct ectasia with fibrosis - atypical lobular hyperplasia with micro calcifications - tamoxifen X 28 months then change to Evista 10/06   BREAST LUMPECTOMY WITH RADIOACTIVE SEED LOCALIZATION Right 01/27/2020   Procedure: RIGHT BREAST LUMPECTOMY WITH RADIOACTIVE SEED LOCALIZATION;  Surgeon: Coralie Keens, MD;  Location: [place];  Service: General;  Laterality: Right;   BREAST SURGERY Left 4/99   breast biopsy - fibrosis   CARDIOVERSION N/A 07/16/2017   Procedure: CARDIOVERSION;  Surgeon: Jerline Pain, MD;  Location: [place];  Service: Cardiovascular;  Laterality: N/A;   CARDIOVERSION N/A 10/20/2018   Procedure: CARDIOVERSION;  Surgeon: Buford Dresser, MD;  Location: [place];  Service: Cardiovascular;  Laterality: N/A;   CATARACT EXTRACTION W/ INTRAOCULAR LENS IMPLANT Right    CESAREAN SECTION     X 2   HYSTEROSCOPY  4/95   w/D&C   TEE WITHOUT CARDIOVERSION N/A 10/20/2018   Procedure: TRANSESOPHAGEAL ECHOCARDIOGRAM (TEE);  Surgeon: Buford Dresser, MD;  Location: [place];  Service: Cardiovascular;  Laterality: N/A;  Current Outpatient Medications  Medication Sig Dispense Refill   apixaban (ELIQUIS) 2.5 MG TABS tablet TAKE 1 TABLET BY MOUTH TWICE A DAY 60 tablet 5   bisoprolol (ZEBETA) 5 MG tablet Take 1 tablet (5 mg total) by mouth daily. 30 tablet 3   flecainide (TAMBOCOR) 150 MG tablet Take 150 mg by mouth 2 (two) times daily.      furosemide (LASIX) 20 MG tablet TAKE 1 TABLET BY MOUTH EVERY DAY 90 tablet 3   Multiple Vitamin (MULTIVITAMIN) tablet Take 1 tablet by mouth daily.     Omega-3 Fatty Acids (FISH OIL) 1200 MG CAPS Take 1,200 mg by mouth in the morning and at bedtime.     predniSONE (DELTASONE) 10 MG tablet Take 1 tablet (10 mg total) by mouth daily with breakfast for 11 days, THEN 0.5 tablets (5 mg total) daily with breakfast for 8 days. 15 tablet 0   No current facility-administered medications for this encounter.    Allergies  Allergen Reactions   Atorvastatin Other (See Comments)    Other reaction(s): messed with her liver   Crab [Shellfish Allergy] Itching    Full body itching   Penicillins Other (See Comments)    REACTION: "ITCHING IN EYES" Has patient had a PCN reaction causing immediate rash, facial/tongue/throat swelling, SOB or lightheadedness with hypotension: Unknown Has patient had a PCN reaction causing severe rash involving mucus membranes or skin necrosis: Unknown Has patient had a PCN reaction that required hospitalization: No Has patient had a PCN reaction occurring within the last 10 years: No If all of the above answers are "NO", then may proceed with Cephalosporin use.    Diltiazem     Rash, burning and itching   Amlodipine Itching   Sulfa Antibiotics Other (See Comments)    REACTION: " NOT SURE" Other reaction(s): eyes itch    Social History   Socioeconomic History   Marital status: Widowed    Spouse name: Not on file   Number of children: 2   Years of education: Not on file   Highest education level: Not on file  Occupational History   Not on file  Tobacco Use   Smoking status: Former    Packs/day: 0.25    Years: 12.00    Total pack years: 3.00    Types: Cigarettes    Start date: 01/16/1957    Quit date: 01/23/1968    Years since quitting: 53.6   Smokeless tobacco: Never   Tobacco comments:    Former smoker 08/04/21  Vaping Use   Vaping Use: Never used   Substance and Sexual Activity   Alcohol use: Yes    Comment: social   Drug use: No   Sexual activity: Not Currently    Partners: Male    Birth control/protection: Post-menopausal  Other Topics Concern   Not on file  Social History Narrative   Not on file   Social Determinants of Health   Financial Resource Strain: Not on file  Food Insecurity: Not on file  Transportation Needs: Not on file  Physical Activity: Not on file  Stress: Not on file  Social Connections: Not on file  Intimate Partner Violence: Not on file     ROS- All systems are reviewed and negative except as per the HPI above.  Physical Exam: Vitals:   08/18/21 0942  BP: 118/60  Pulse: 83  Weight: 62.1 kg  Height: '5\' 3"'$  (1.6 m)     GEN- The patient is a well appearing elderly female,  alert and oriented x 3 today.   HEENT-head normocephalic, atraumatic, sclera clear, conjunctiva pink, hearing intact, trachea midline. Lungs- Clear to ausculation bilaterally, normal work of breathing Heart- irregular rate and rhythm, no murmurs, rubs or gallops  GI- soft, NT, ND, + BS Extremities- no clubbing, cyanosis, or edema MS- no significant deformity or atrophy Skin- no rash or lesion Psych- euthymic mood, full affect Neuro- strength and sensation are intact   Wt Readings from Last 3 Encounters:  08/18/21 62.1 kg  08/04/21 63.8 kg  07/26/21 59 kg    EKG today demonstrates  Atypical atrial flutter with variable block Vent. rate 83 BPM PR interval * ms QRS duration 128 ms QT/QTcB 404/474 ms  Echo 02/04/21 demonstrated  1. Left ventricular ejection fraction, by estimation, is 60 to 65%. The  left ventricle has normal function. The left ventricle has no regional  wall motion abnormalities. There is mild asymmetric left ventricular  hypertrophy of the basal-septal segment. Left ventricular diastolic parameters are indeterminate.   2. Right ventricular systolic function is normal. The right ventricular   size is normal. There is normal pulmonary artery systolic pressure. The  estimated right ventricular systolic pressure is 16.0 mmHg.   3. Left atrial size was moderately dilated.   4. The mitral valve is degenerative. Trivial mitral valve regurgitation.  Moderate to severe mitral annular calcification.   5. The aortic valve is tricuspid. There is mild calcification of the  aortic valve. There is mild thickening of the aortic valve. Aortic valve  regurgitation is not visualized. Aortic valve sclerosis/calcification is  present, without any evidence of  aortic stenosis.   6. The inferior vena cava is normal in size with greater than 50%  respiratory variability, suggesting right atrial pressure of 3 mmHg.   Comparison(s): No significant change from prior study.   Epic records are reviewed at length today  CHA2DS2-VASc Score = 4  [name] score is based upon: CHF History: 0 HTN History: 1 Diabetes History: 0 Stroke History: 0 Vascular Disease History: 0 Age Score: 2 Gender Score: 1       ASSESSMENT AND PLAN: 1. Persistent Atrial Fibrillation/atrial flutter [name] CHA2DS2-VASc score is 4, indicating a 4.8% annual risk of stroke.   Diltiazem discontinued due to adverse reaction (itching) We discussed rhythm control options. Will have her return in 3-4 weeks to assess if she is still persistent. If she is, will arrange repeat DCCV.  Continue flecainide 150 mg BID Continue bisoprolol 5 mg daily We also discussed changing AAD to dofetilide if needed, information sheet given previously. She is not a good candidate for amiodarone with lung issues.  Increase Eliquis to 5 mg BID now that her weight is > 60 kg consistently and her Cr is < 1.5. She would not be able to have DCCV until she has been on this dose for at least 3 weeks.  We discussed smart device technology for home monitoring.   2. Secondary Hypercoagulable State (ICD10:  D68.69) The patient is at significant  risk for stroke/thromboembolism based upon her CHA2DS2-VASc Score of 4.  Continue Apixaban (Eliquis).   3. HTN Stable, no changes today.   Follow up in the AF clinic in 3-4 weeks.    [facility] [address] [place], [place] [address] [phone] 08/18/2021 10:01 AM

## 2021-08-18 NOTE — Patient Instructions (Signed)
Increase Eliquis to '5mg'$  twice a day

## 2021-08-23 ENCOUNTER — Other Ambulatory Visit: Payer: Self-pay | Admitting: Cardiology

## 2021-08-24 ENCOUNTER — Encounter: Payer: Self-pay | Admitting: Pulmonary Disease

## 2021-08-24 ENCOUNTER — Ambulatory Visit: Payer: Medicare PPO | Admitting: Pulmonary Disease

## 2021-08-24 VITALS — BP 128/84 | HR 79 | Ht 63.0 in | Wt 135.0 lb

## 2021-08-24 DIAGNOSIS — J849 Interstitial pulmonary disease, unspecified: Secondary | ICD-10-CM

## 2021-08-24 NOTE — Progress Notes (Signed)
Synopsis: Referred in April 2023 for cough by Yaakov Guthrie, MD  Subjective:   PATIENT ID: Anna Mathews: female DOB: 02/23/1940, MRN: [id number]  HPI  Chief Complaint  Patient presents with   Follow-up    6 wk f/u    Anna Mathews is an 81 year old woman, former smoker with atrial fibrillation, GERD, hiatal hernia, and hypertension who returns to pulmonary clinic for organizing pneumonia.   She has completed steroid taper at end of July. She feels her breathing is back to baseline. She has no active complaints at this time.  OV 07/12/21 She continues to feel much better. She is accompanied by her friend today.   She is currently on '30mg'$  of prednisone daily and bactrim DS 1 tab 3 days per week.   Chest radiograph at last visit showed improved infiltrates.  OV 06/07/21 She was started on high dose prednisone taper on 05/12/21 empirically for concern of organizing pneumonia vs pneumonitis related to hydralazine exposure.   She has been on '40mg'$  prednisone and is feeling much better. Her fatigue has resolved and she denies cough. She has mild dyspnea but overal much improved. She has started taking bactrim DS 3 times per week for pneumocystis prophylaxis.   OV 05/08/21 She reports having shortness of breath since the beginning of the year which progressed in mid to late February. She was seeing her cardiology team and her blood pressure medications were being titrated at that time. There was concern she had drug induced rash secondary to amlodipine but it seems that the rash persisted despite switching off this medication. 1/13 for hypertensive urgency but no chest imaging performed at that time. She was given '15mg'$  of IV hydralazine in the ED on 02/03/21.   She has been adjusting many different antihypertensive agents with the cardiology team. She was started on hydralazine '10mg'$  BID on 2/7 then called the cardiology office 2/10 with reports of redness on her arms/legs with a burning  sensation. The dose was reduced then ultimately stopped on 2/13.   She continued to have progressive dyspnea in early March. She was started on lasix at that time with no improvement. A chest x-ray 03/28/21 was obtained which showed which showed right lower lobe and left perihilar opacities concerning for pneumonia. She was treated with doxycyline without much improvement. She continued to feel weak with low energy. She was given levaquin by her primary care on 3/23 which did not help improve her symptoms. She had x-ray 3/28 which showed progressive multifocal infiltrates. She was then treated with doxycycline again with no improvement. X ray 4/4 showed improvement of opacities on right but persistent left sided opacities.   CT Chest 4/11 showed multiple ground glass opacities in both upper lobes and right lower lobe.   She continues to have dyspnea, dry cough and fatigue. She has intermittent nausea. She has lost 12-15lbs since last fall. She denies any joint pains and no other skin rashes.   She quit smoking in 1970. Denies any harmful occupational exposures. Her father had emphysema.   Past Medical History:  Diagnosis Date   Atrial fibrillation ([place]) 09/2009   Breast cancer ([place])    Diverticulosis 09/2006   GERD (gastroesophageal reflux disease)    Hiatal hernia    Hyperlipidemia    Hypertension    Osteopenia    Postmenopausal hormone replacement therapy 1989 - 07/2000     Family History  Problem Relation Age of Onset   Hypertension Mother  Heart disease Father    COPD Father    Diabetes Maternal Grandfather    Diabetes Son    Rheum arthritis Brother    Kidney cancer Brother    Breast cancer Maternal Grandmother    Diabetes Maternal Aunt    Diabetes Maternal Aunt      Social History   Socioeconomic History   Marital status: Widowed    Spouse name: Not on file   Number of children: 2   Years of education: Not on file   Highest education level: Not on file  Occupational  History   Not on file  Tobacco Use   Smoking status: Former    Packs/day: 0.25    Years: 12.00    Total pack years: 3.00    Types: Cigarettes    Start date: 01/16/1957    Quit date: 01/23/1968    Years since quitting: 53.6   Smokeless tobacco: Never   Tobacco comments:    Former smoker 08/04/21  Vaping Use   Vaping Use: Never used  Substance and Sexual Activity   Alcohol use: Yes    Comment: social   Drug use: No   Sexual activity: Not Currently    Partners: Male    Birth control/protection: Post-menopausal  Other Topics Concern   Not on file  Social History Narrative   Not on file   Social Determinants of Health   Financial Resource Strain: Not on file  Food Insecurity: Not on file  Transportation Needs: Not on file  Physical Activity: Not on file  Stress: Not on file  Social Connections: Not on file  Intimate Partner Violence: Not on file     Allergies  Allergen Reactions   Atorvastatin Other (See Comments)    Other reaction(s): messed with her liver   Crab [Shellfish Allergy] Itching    Full body itching   Penicillins Other (See Comments)    REACTION: "ITCHING IN EYES" Has patient had a PCN reaction causing immediate rash, facial/tongue/throat swelling, SOB or lightheadedness with hypotension: Unknown Has patient had a PCN reaction causing severe rash involving mucus membranes or skin necrosis: Unknown Has patient had a PCN reaction that required hospitalization: No Has patient had a PCN reaction occurring within the last 10 years: No If all of the above answers are "NO", then may proceed with Cephalosporin use.    Diltiazem     Rash, burning and itching   Amlodipine Itching   Sulfa Antibiotics Other (See Comments)    REACTION: " NOT SURE" Other reaction(s): eyes itch     Outpatient Medications Prior to Visit  Medication Sig Dispense Refill   apixaban (ELIQUIS) 5 MG TABS tablet Take 1 tablet (5 mg total) by mouth 2 (two) times daily. 60 tablet 3    bisoprolol (ZEBETA) 5 MG tablet Take 1 tablet (5 mg total) by mouth daily. 30 tablet 3   flecainide (TAMBOCOR) 150 MG tablet Take 150 mg by mouth 2 (two) times daily.     furosemide (LASIX) 20 MG tablet TAKE 1 TABLET BY MOUTH EVERY DAY 90 tablet 3   Multiple Vitamin (MULTIVITAMIN) tablet Take 1 tablet by mouth daily.     Omega-3 Fatty Acids (FISH OIL) 1200 MG CAPS Take 1,200 mg by mouth in the morning and at bedtime.     No facility-administered medications prior to visit.   Review of Systems  Constitutional:  Negative for chills, fever, malaise/fatigue and weight loss.  HENT:  Positive for congestion. Negative for sinus pain and sore  throat.   Eyes: Negative.   Respiratory:  Negative for cough, hemoptysis, sputum production, shortness of breath and wheezing.   Cardiovascular:  Negative for chest pain, palpitations, orthopnea, claudication and leg swelling.  Gastrointestinal:  Negative for abdominal pain, heartburn, nausea and vomiting.  Genitourinary: Negative.   Musculoskeletal:  Negative for joint pain and myalgias.  Skin:  Negative for rash.  Neurological:  Negative for weakness.  Endo/Heme/Allergies: Negative.   Psychiatric/Behavioral: Negative.      Objective:   Vitals:   08/24/21 1408  BP: 128/84  Pulse: 79  SpO2: 98%  Weight: 135 lb (61.2 kg)  Height: '5\' 3"'$  (1.6 m)   Physical Exam Constitutional:      General: She is not in acute distress.    Appearance: She is not ill-appearing.  HENT:     Head: Normocephalic and atraumatic.  Cardiovascular:     Rate and Rhythm: Normal rate and regular rhythm.     Pulses: Normal pulses.     Heart sounds: Normal heart sounds. No murmur heard. Pulmonary:     Effort: Pulmonary effort is normal.     Breath sounds: No wheezing, rhonchi or rales.  Musculoskeletal:     Right lower leg: No edema.     Left lower leg: No edema.  Skin:    General: Skin is warm and dry.  Neurological:     General: No focal deficit present.     Mental  Status: She is alert.  Psychiatric:        Mood and Affect: Mood normal.        Behavior: Behavior normal.        Thought Content: Thought content normal.        Judgment: Judgment normal.    CBC    Component Value Date/Time   WBC 8.0 05/08/2021 1255   RBC 4.22 05/08/2021 1255   HGB 15.0 07/26/2021 1011   HGB 12.7 06/07/2020 1549   HCT 44.0 07/26/2021 1011   HCT 37.1 06/07/2020 1549   PLT 320.0 05/08/2021 1255   PLT 222 06/07/2020 1549   MCV 88.3 05/08/2021 1255   MCV 91 06/07/2020 1549   MCH 30.3 02/03/2021 1045   MCHC 32.4 05/08/2021 1255   RDW 14.6 05/08/2021 1255   RDW 12.2 06/07/2020 1549   LYMPHSABS 1.0 05/08/2021 1255   MONOABS 0.6 05/08/2021 1255   EOSABS 0.2 05/08/2021 1255   BASOSABS 0.0 05/08/2021 1255      Latest Ref Rng & Units 07/26/2021   10:11 AM 07/05/2021    2:24 PM 05/08/2021   12:55 PM  BMP  Glucose 70 - 99 mg/dL 105  231  99   BUN 8 - 23 mg/dL 41  30  27   Creatinine 0.44 - 1.00 mg/dL 1.00  1.19  0.89   Sodium 135 - 145 mmol/L 142  139  140   Potassium 3.5 - 5.1 mmol/L 4.0  4.3  4.3   Chloride 98 - 111 mmol/L 107  101  99   CO2 19 - 32 mEq/L  29  34   Calcium 8.4 - 10.5 mg/dL  9.4  9.6    Chest imaging: CXR 06/07/21 Resolution of prior mixed interstitial and airspace disease, compatible with resolved pneumonia, with background of chronic changes.  CT Chest 05/02/21 Mediastinum/Nodes: 2.4 cm left thyroid nodule is noted. Esophagus is unremarkable. Mildly enlarged precarinal adenopathy is noted which most likely is reactive or inflammatory in etiology.   Lungs/Pleura: No pneumothorax or pleural effusion is  noted. Multiple ground-glass opacities are noted in both upper lobes and to some degree the right lower lobe, which may represent multifocal inflammation or sequela of prior infection. Bilateral lower lobe opacities are noted, right greater than left, concerning for pneumonia.  PFT:     No data to display           Labs:  Path:  Echo 02/04/21: LV EF 60-65%. RV size and systolic function is normal. LA size is moderately dilated.   Heart Catheterization:  Assessment & Plan:   ILD (interstitial lung disease) ([place]) - Plan: CT CHEST HIGH RESOLUTION  Discussion: Thania Woodlief is an 81 year old woman, former smoker with atrial fibrillation, GERD, hiatal hernia, and hypertension who returns to pulmonary clinic for pneumonitis.   She was treated with high dose prednisone taper for suspected drug induced pneumonitis vs organizing pneumonia related to hydralazine exposure.  She started prednisone '40mg'$  daily on 05/12/21 and completed slow taper on 08/19/21.   She reports her respiratory status is back to baseline.   We will check high resolution CT Chest scan for resolution of her symptoms.   Recommend sleep apnea testing given her hypertension and atrial fibrillation.  Freda Jackson, MD [facility] Office: [phone]   Current Outpatient Medications:    apixaban (ELIQUIS) 5 MG TABS tablet, Take 1 tablet (5 mg total) by mouth 2 (two) times daily., Disp: 60 tablet, Rfl: 3   bisoprolol (ZEBETA) 5 MG tablet, Take 1 tablet (5 mg total) by mouth daily., Disp: 30 tablet, Rfl: 3   flecainide (TAMBOCOR) 150 MG tablet, Take 150 mg by mouth 2 (two) times daily., Disp: , Rfl:    furosemide (LASIX) 20 MG tablet, TAKE 1 TABLET BY MOUTH EVERY DAY, Disp: 90 tablet, Rfl: 3   Multiple Vitamin (MULTIVITAMIN) tablet, Take 1 tablet by mouth daily., Disp: , Rfl:    Omega-3 Fatty Acids (FISH OIL) 1200 MG CAPS, Take 1,200 mg by mouth in the morning and at bedtime., Disp: , Rfl:

## 2021-08-24 NOTE — Patient Instructions (Signed)
We will check a high resolution CT Chest scan to make sure the lungs have healed after the prednisone therapy  Follow up in 3 months

## 2021-09-05 ENCOUNTER — Ambulatory Visit (HOSPITAL_COMMUNITY): Payer: Medicare PPO | Attending: Pulmonary Disease

## 2021-09-15 ENCOUNTER — Other Ambulatory Visit: Payer: Self-pay | Admitting: Cardiology

## 2021-09-20 ENCOUNTER — Encounter (HOSPITAL_COMMUNITY): Payer: Self-pay | Admitting: Physician Assistant

## 2021-09-20 ENCOUNTER — Ambulatory Visit (HOSPITAL_COMMUNITY)
Admission: RE | Admit: 2021-09-20 | Discharge: 2021-09-20 | Disposition: A | Discharge status: Home / Self Care | Payer: Medicare PPO | Source: Ambulatory Visit | Attending: Physician Assistant | Admitting: Physician Assistant

## 2021-09-20 VITALS — BP 146/72 | HR 64 | Ht 63.0 in | Wt 138.0 lb

## 2021-09-20 DIAGNOSIS — E785 Hyperlipidemia, unspecified: Secondary | ICD-10-CM | POA: Diagnosis not present

## 2021-09-20 DIAGNOSIS — J849 Interstitial pulmonary disease, unspecified: Secondary | ICD-10-CM | POA: Diagnosis not present

## 2021-09-20 DIAGNOSIS — D6869 Other thrombophilia: Secondary | ICD-10-CM

## 2021-09-20 DIAGNOSIS — I4892 Unspecified atrial flutter: Secondary | ICD-10-CM | POA: Diagnosis not present

## 2021-09-20 DIAGNOSIS — I4819 Other persistent atrial fibrillation: Secondary | ICD-10-CM | POA: Diagnosis present

## 2021-09-20 DIAGNOSIS — I10 Essential (primary) hypertension: Secondary | ICD-10-CM | POA: Insufficient documentation

## 2021-09-20 DIAGNOSIS — Z7901 Long term (current) use of anticoagulants: Secondary | ICD-10-CM | POA: Insufficient documentation

## 2021-09-20 DIAGNOSIS — D6859 Other primary thrombophilia: Secondary | ICD-10-CM | POA: Diagnosis not present

## 2021-09-20 NOTE — Progress Notes (Signed)
Primary Care Physician: Vernie Shanks, MD (Inactive) Primary Cardiologist: Dr Irish Lack Primary Electrophysiologist: Dr Curt Bears Referring Physician: Oda Kilts PA   Anna Mathews is a 81 y.o. female with a history of HTN, HLD, ILD, atrial flutter, atrial fibrillation who presents for consultation in the [facility]. Patient is on Eliquis for a CHADS2VASC score of 4. She has been maintained on flecainide for rhythm control. She was seen 07/13/21 and found to be in atrial flutter. She presented for DCCV on 07/26/21 but had converted back to SR so the procedure was cancelled. Prior to the onset of her flutter, she had pneumonia and was on a steroid taper. Patient developed itching all over her body after starting diltiazem. This was discontinued and she was started on bisoprolol.   On follow up today, patient reports that she has done well from an afib standpoint since her last visit. She has not had any symptoms in the interim. She is in SR today. No bleeding issues on anticoagulation.   Today, she denies symptoms of palpitations, chest pain, orthopnea, PND, presyncope, syncope, snoring, daytime somnolence, bleeding, or neurologic sequela. The patient is tolerating medications without difficulties and is otherwise without complaint today.    Atrial Fibrillation Risk Factors:  she does not have symptoms or diagnosis of sleep apnea. she does not have a history of rheumatic fever.   she has a BMI of Body mass index is 24.45 kg/m.Marland Kitchen Filed Weights   09/20/21 1115  Weight: 62.6 kg    Family History  Problem Relation Age of Onset   Hypertension Mother    Heart disease Father    COPD Father    Diabetes Maternal Grandfather    Diabetes Son    Rheum arthritis Brother    Kidney cancer Brother    Breast cancer Maternal Grandmother    Diabetes Maternal Aunt    Diabetes Maternal Aunt      Atrial Fibrillation Management history:  Previous antiarrhythmic  drugs: flecainide Previous cardioversions: 2019, 2020 Previous ablations: none CHADS2VASC score: 4 Anticoagulation history: Eliquis   Past Medical History:  Diagnosis Date   Atrial fibrillation ([place]) 09/2009   Breast cancer ([place])    Diverticulosis 09/2006   GERD (gastroesophageal reflux disease)    Hiatal hernia    Hyperlipidemia    Hypertension    Osteopenia    Postmenopausal hormone replacement therapy 1989 - 07/2000   Past Surgical History:  Procedure Laterality Date   BREAST DUCTAL SYSTEM EXCISION Left 06/2002   duct ectasia with fibrosis - atypical lobular hyperplasia with micro calcifications - tamoxifen X 28 months then change to Evista 10/06   BREAST LUMPECTOMY WITH RADIOACTIVE SEED LOCALIZATION Right 01/27/2020   Procedure: RIGHT BREAST LUMPECTOMY WITH RADIOACTIVE SEED LOCALIZATION;  Surgeon: Coralie Keens, MD;  Location: [facility];  Service: General;  Laterality: Right;   BREAST SURGERY Left 4/99   breast biopsy - fibrosis   CARDIOVERSION N/A 07/16/2017   Procedure: CARDIOVERSION;  Surgeon: Jerline Pain, MD;  Location: [facility];  Service: Cardiovascular;  Laterality: N/A;   CARDIOVERSION N/A 10/20/2018   Procedure: CARDIOVERSION;  Surgeon: Buford Dresser, MD;  Location: [facility];  Service: Cardiovascular;  Laterality: N/A;   CATARACT EXTRACTION W/ INTRAOCULAR LENS IMPLANT Right    CESAREAN SECTION     X 2   HYSTEROSCOPY  4/95   w/D&C   TEE WITHOUT CARDIOVERSION N/A 10/20/2018   Procedure: TRANSESOPHAGEAL ECHOCARDIOGRAM (TEE);  Surgeon: Buford Dresser, MD;  Location: [facility] ENDOSCOPY;  Service: Cardiovascular;  Laterality: N/A;    Current Outpatient Medications  Medication Sig Dispense Refill   apixaban (ELIQUIS) 5 MG TABS tablet Take 1 tablet (5 mg total) by mouth 2 (two) times daily. 60 tablet 3   bisoprolol (ZEBETA) 5 MG tablet Take 1 tablet (5 mg total) by mouth daily. 30 tablet 3   busPIRone (BUSPAR) 15 MG tablet Take 7.5 mg by mouth 2 (two)  times daily.     flecainide (TAMBOCOR) 150 MG tablet Take 150 mg by mouth 2 (two) times daily.     furosemide (LASIX) 20 MG tablet TAKE 1 TABLET BY MOUTH EVERY DAY 90 tablet 3   Multiple Vitamin (MULTIVITAMIN) tablet Take 1 tablet by mouth daily.     Omega-3 Fatty Acids (FISH OIL) 1200 MG CAPS Take 1,200 mg by mouth in the morning and at bedtime.     No current facility-administered medications for this encounter.    Allergies  Allergen Reactions   Atorvastatin Other (See Comments)    Other reaction(s): messed with her liver   Crab [Shellfish Allergy] Itching    Full body itching   Penicillins Other (See Comments)    REACTION: "ITCHING IN EYES" Has patient had a PCN reaction causing immediate rash, facial/tongue/throat swelling, SOB or lightheadedness with hypotension: Unknown Has patient had a PCN reaction causing severe rash involving mucus membranes or skin necrosis: Unknown Has patient had a PCN reaction that required hospitalization: No Has patient had a PCN reaction occurring within the last 10 years: No If all of the above answers are "NO", then may proceed with Cephalosporin use.    Diltiazem     Rash, burning and itching   Amlodipine Itching   Sulfa Antibiotics Other (See Comments)    REACTION: " NOT SURE" Other reaction(s): eyes itch    Social History   Socioeconomic History   Marital status: Widowed    Spouse name: Not on file   Number of children: 2   Years of education: Not on file   Highest education level: Not on file  Occupational History   Not on file  Tobacco Use   Smoking status: Former    Packs/day: 0.25    Years: 12.00    Total pack years: 3.00    Types: Cigarettes    Start date: 01/16/1957    Quit date: 01/23/1968    Years since quitting: 53.6   Smokeless tobacco: Never   Tobacco comments:    Former smoker 08/04/21  Vaping Use   Vaping Use: Never used  Substance and Sexual Activity   Alcohol use: Yes    Comment: social   Drug use: No    Sexual activity: Not Currently    Partners: Male    Birth control/protection: Post-menopausal  Other Topics Concern   Not on file  Social History Narrative   Not on file   Social Determinants of Health   Financial Resource Strain: Not on file  Food Insecurity: Not on file  Transportation Needs: Not on file  Physical Activity: Not on file  Stress: Not on file  Social Connections: Not on file  Intimate Partner Violence: Not on file     ROS- All systems are reviewed and negative except as per the HPI above.  Physical Exam: Vitals:   09/20/21 1115  BP: (!) 146/72  Pulse: 64  SpO2: 94%  Weight: 62.6 kg  Height: '5\' 3"'$  (1.6 m)     GEN- The patient is a well appearing elderly female, alert and  oriented x 3 today.   HEENT-head normocephalic, atraumatic, sclera clear, conjunctiva pink, hearing intact, trachea midline. Lungs- Clear to ausculation bilaterally, normal work of breathing Heart- Regular rate and rhythm, no murmurs, rubs or gallops  GI- soft, NT, ND, + BS Extremities- no clubbing, cyanosis, or edema MS- no significant deformity or atrophy Skin- no rash or lesion Psych- euthymic mood, full affect Neuro- strength and sensation are intact   Wt Readings from Last 3 Encounters:  09/20/21 62.6 kg  08/24/21 61.2 kg  08/18/21 62.1 kg    EKG today demonstrates  SR Vent. rate 64 BPM PR interval 196 ms QRS duration 118 ms QT/QTcB 424/437 ms  Echo 02/04/21 demonstrated  1. Left ventricular ejection fraction, by estimation, is 60 to 65%. The  left ventricle has normal function. The left ventricle has no regional  wall motion abnormalities. There is mild asymmetric left ventricular  hypertrophy of the basal-septal segment. Left ventricular diastolic parameters are indeterminate.   2. Right ventricular systolic function is normal. The right ventricular  size is normal. There is normal pulmonary artery systolic pressure. The  estimated right ventricular systolic  pressure is 35.3 mmHg.   3. Left atrial size was moderately dilated.   4. The mitral valve is degenerative. Trivial mitral valve regurgitation.  Moderate to severe mitral annular calcification.   5. The aortic valve is tricuspid. There is mild calcification of the  aortic valve. There is mild thickening of the aortic valve. Aortic valve  regurgitation is not visualized. Aortic valve sclerosis/calcification is  present, without any evidence of  aortic stenosis.   6. The inferior vena cava is normal in size with greater than 50%  respiratory variability, suggesting right atrial pressure of 3 mmHg.   Comparison(s): No significant change from prior study.   Epic records are reviewed at length today  CHA2DS2-VASc Score = 4  [name] score is based upon: CHF History: 0 HTN History: 1 Diabetes History: 0 Stroke History: 0 Vascular Disease History: 0 Age Score: 2 Gender Score: 1       ASSESSMENT AND PLAN: 1. Persistent Atrial Fibrillation/atrial flutter [name] CHA2DS2-VASc score is 4, indicating a 4.8% annual risk of stroke.   Diltiazem discontinued due to adverse reaction (itching) Patient in [place] today. She has not had any symptoms since her last visit. Will continue present therapy.  Continue flecainide 150 mg BID Continue bisoprolol 5 mg daily We also discussed changing AAD to dofetilide if needed, information sheet given again today. She is not a good candidate for amiodarone with lung issues.  Continue Eliquis 5 mg BID (weight > 60 kg, Cr is < 1.5)   2. Secondary Hypercoagulable State (ICD10:  D68.69) The patient is at significant risk for stroke/thromboembolism based upon her CHA2DS2-VASc Score of 4.  Continue Apixaban (Eliquis).   3. HTN Stable, no changes today.   Follow up with Dr Irish Lack as scheduled. AF clinic in 6 months.    [facility] [address] [place], [place] [address] [phone] 09/20/2021 11:22  AM

## 2021-09-27 ENCOUNTER — Ambulatory Visit (INDEPENDENT_AMBULATORY_CARE_PROVIDER_SITE_OTHER): Payer: Medicare PPO

## 2021-09-27 ENCOUNTER — Encounter: Payer: Self-pay | Admitting: Primary Care

## 2021-09-27 ENCOUNTER — Other Ambulatory Visit (INDEPENDENT_AMBULATORY_CARE_PROVIDER_SITE_OTHER): Payer: Medicare PPO

## 2021-09-27 ENCOUNTER — Telehealth: Payer: Self-pay | Admitting: Primary Care

## 2021-09-27 ENCOUNTER — Ambulatory Visit: Payer: Medicare PPO | Admitting: Primary Care

## 2021-09-27 VITALS — BP 138/72 | HR 68 | Temp 98.8°F | Ht 63.0 in | Wt 135.4 lb

## 2021-09-27 DIAGNOSIS — R9389 Abnormal findings on diagnostic imaging of other specified body structures: Secondary | ICD-10-CM | POA: Diagnosis not present

## 2021-09-27 DIAGNOSIS — R0602 Shortness of breath: Secondary | ICD-10-CM | POA: Diagnosis not present

## 2021-09-27 LAB — CBC WITH DIFFERENTIAL/PLATELET
Basophils Absolute: 0 10*3/uL (ref 0.0–0.1)
Basophils Relative: 0.5 % (ref 0.0–3.0)
Eosinophils Absolute: 0.2 10*3/uL (ref 0.0–0.7)
Eosinophils Relative: 2.4 % (ref 0.0–5.0)
HCT: 38.8 % (ref 36.0–46.0)
Hemoglobin: 12.7 g/dL (ref 12.0–15.0)
Lymphocytes Relative: 11.1 % — ABNORMAL LOW (ref 12.0–46.0)
Lymphs Abs: 0.9 10*3/uL (ref 0.7–4.0)
MCHC: 32.8 g/dL (ref 30.0–36.0)
MCV: 90 fl (ref 78.0–100.0)
Monocytes Absolute: 0.8 10*3/uL (ref 0.1–1.0)
Monocytes Relative: 10.1 % (ref 3.0–12.0)
Neutro Abs: 6.3 10*3/uL (ref 1.4–7.7)
Neutrophils Relative %: 75.9 % (ref 43.0–77.0)
Platelets: 397 10*3/uL (ref 150.0–400.0)
RBC: 4.31 Mil/uL (ref 3.87–5.11)
RDW: 13.4 % (ref 11.5–15.5)
WBC: 8.3 10*3/uL (ref 4.0–10.5)

## 2021-09-27 NOTE — Progress Notes (Signed)
$'@Patient'O$  ID: Anna Mathews, female    DOB: 07-12-1940, 81 y.o.   MRN: [id number]  Chief Complaint  Patient presents with   Follow-up    Pt states she is having SOB with exertion.     Referring provider: No ref. provider found  HPI: 81 year old woman, former smoker with atrial fibrillation, GERD, hiatal hernia, and hypertension who returns to pulmonary clinic for organizing pneumonia.  Patient of Dr. Erin Fulling, last seen in office on 08/24/2021.  Previous [facility] pulmonary encounter:  OV 07/12/21 She continues to feel much better. She is accompanied by her friend today.   She is currently on '30mg'$  of prednisone daily and bactrim DS 1 tab 3 days per week.   Chest radiograph at last visit showed improved infiltrates.  OV 06/07/21 She was started on high dose prednisone taper on 05/12/21 empirically for concern of organizing pneumonia vs pneumonitis related to hydralazine exposure.   She has been on '40mg'$  prednisone and is feeling much better. Her fatigue has resolved and she denies cough. She has mild dyspnea but overal much improved. She has started taking bactrim DS 3 times per week for pneumocystis prophylaxis.   OV 05/08/21 She reports having shortness of breath since the beginning of the year which progressed in mid to late February. She was seeing her cardiology team and her blood pressure medications were being titrated at that time. There was concern she had drug induced rash secondary to amlodipine but it seems that the rash persisted despite switching off this medication. 1/13 for hypertensive urgency but no chest imaging performed at that time. She was given '15mg'$  of IV hydralazine in the ED on 02/03/21.   She has been adjusting many different antihypertensive agents with the cardiology team. She was started on hydralazine '10mg'$  BID on 2/7 then called the cardiology office 2/10 with reports of redness on her arms/legs with a burning sensation. The dose was reduced then ultimately stopped on  2/13.   She continued to have progressive dyspnea in early March. She was started on lasix at that time with no improvement. A chest x-ray 03/28/21 was obtained which showed which showed right lower lobe and left perihilar opacities concerning for pneumonia. She was treated with doxycyline without much improvement. She continued to feel weak with low energy. She was given levaquin by her primary care on 3/23 which did not help improve her symptoms. She had x-ray 3/28 which showed progressive multifocal infiltrates. She was then treated with doxycycline again with no improvement. X ray 4/4 showed improvement of opacities on right but persistent left sided opacities.   CT Chest 4/11 showed multiple ground glass opacities in both upper lobes and right lower lobe.   She continues to have dyspnea, dry cough and fatigue. She has intermittent nausea. She has lost 12-15lbs since last fall. She denies any joint pains and no other skin rashes.   She quit smoking in 1970. Denies any harmful occupational exposures. Her father had emphysema.   09/27/2021 - Interim hx  Patient presents today for acute overview.  Past medical history significant for A-fib, hiatal hernia, hypertension, pneumonitis.  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  Started prednisone end of April and completed taper on 08/19/2021.  Her respiratory status returned to baseline at that time.  She was ordered for high-resolution CAT scan during her last visit to follow-up on pneumonitis.  Dr. Erin Fulling also recommended sleep testing given  her history of hypertension and A-fib.   Today she comes in for follow-up. Shortness of breath worsened about 2 weeks after completing prednisone taper end of July. Associated chest tightness and nocturnal wheezing. Heat exacerbates her dyspnea. She has a dog whom she walks twice a day. She saw cardiology end of  August for Afib. She is on several blood pressure medications. They discontinued diltiazem d/t itching. She was in sinus rhythm during that visit. Maintained on flecainide, bisoprolol and Eliquis. Needs HRCT and sleep study.    CT Chest 05/02/21 Mediastinum/Nodes: 2.4 cm left thyroid nodule is noted. Esophagus is unremarkable. Mildly enlarged precarinal adenopathy is noted which most likely is reactive or inflammatory in etiology.   Lungs/Pleura: No pneumothorax or pleural effusion is noted. Multiple ground-glass opacities are noted in both upper lobes and to some degree the right lower lobe, which may represent multifocal inflammation or sequela of prior infection. Bilateral lower lobe opacities are noted, right greater than left, concerning for pneumonia.    Allergies  Allergen Reactions   Atorvastatin Other (See Comments)    Other reaction(s): messed with her liver   Crab [Shellfish Allergy] Itching    Full body itching   Penicillins Other (See Comments)    REACTION: "ITCHING IN EYES" Has patient had a PCN reaction causing immediate rash, facial/tongue/throat swelling, SOB or lightheadedness with hypotension: Unknown Has patient had a PCN reaction causing severe rash involving mucus membranes or skin necrosis: Unknown Has patient had a PCN reaction that required hospitalization: No Has patient had a PCN reaction occurring within the last 10 years: No If all of the above answers are "NO", then may proceed with Cephalosporin use.    Diltiazem Other (See Comments)    Rash, burning and itching   Amlodipine Itching    Purpura rash   Sulfa Antibiotics Other (See Comments)    REACTION: " NOT SURE" Other reaction(s): eyes itch    Immunization History  Administered Date(s) Administered   Influenza Split 11/03/2010, 10/23/2011, 09/24/2013, 11/21/2014, 11/09/2016, 11/23/2020   Influenza, High Dose Seasonal PF 11/07/2016, 10/18/2017, 09/22/2018   Influenza,inj,quad, With Preservative  11/07/2016   Influenza-Unspecified 11/03/2010, 10/23/2011, 09/24/2013, 11/21/2014, 11/07/2016   Moderna Covid-19 Vaccine Bivalent Booster 12yr & up 11/16/2020   Moderna Sars-Covid-2 Vaccination 02/04/2019, 03/04/2019, 11/05/2019, 06/22/2020   Pneumococcal Conjugate-13 11/17/2013   Pneumococcal Polysaccharide-23 01/08/2005   Td 03/12/2014   Tdap 08/16/2009   Zoster Recombinat (Shingrix) 05/13/2017, 12/22/2017   Zoster, Live 05/20/2006, 12/22/2017    Past Medical History:  Diagnosis Date   Atrial fibrillation ([facility] 09/2009   Breast cancer ([facility]    Diverticulosis 09/2006   GERD (gastroesophageal reflux disease)    Hiatal hernia    Hyperlipidemia    Hypertension    Osteopenia    Postmenopausal hormone replacement therapy 1989 - 07/2000    Tobacco History: Social History   Tobacco Use  Smoking Status Former   Packs/day: 0.25   Years: 12.00   Total pack years: 3.00   Types: Cigarettes   Start date: 01/16/1957   Quit date: 01/23/1968   Years since quitting: 53.7  Smokeless Tobacco Never  Tobacco Comments   Former smoker 08/04/21   Counseling given: Not Answered Tobacco comments: Former smoker 08/04/21   Outpatient Medications Prior to Visit  Medication Sig Dispense Refill   apixaban (ELIQUIS) 5 MG TABS tablet Take 1 tablet (5 mg total) by mouth 2 (two) times daily. 60 tablet 3   bisoprolol (ZEBETA) 5 MG tablet Take 1 tablet (5 mg  total) by mouth daily. 30 tablet 3   busPIRone (BUSPAR) 15 MG tablet Take 7.5 mg by mouth 2 (two) times daily.     flecainide (TAMBOCOR) 150 MG tablet Take 150 mg by mouth 2 (two) times daily.     furosemide (LASIX) 20 MG tablet TAKE 1 TABLET BY MOUTH EVERY DAY (Patient taking differently: Take 20 mg by mouth daily as needed for fluid.) 90 tablet 3   Omega-3 Fatty Acids (FISH OIL) 1200 MG CAPS Take 1,200 mg by mouth in the morning and at bedtime.     Multiple Vitamin (MULTIVITAMIN) tablet Take 1 tablet by mouth daily.     No  facility-administered medications prior to visit.    Review of Systems  Review of Systems  Constitutional: Negative.   HENT: Negative.    Respiratory:  Positive for chest tightness and shortness of breath. Negative for cough and wheezing.        Nocturnal wheezing      Physical Exam  BP 138/72 (BP Location: Left Arm, Patient Position: Sitting, Cuff Size: Normal)   Pulse 68   Temp 98.8 F (37.1 C) (Oral)   Ht '5\' 3"'$  (1.6 m)   Wt 135 lb 6.4 oz (61.4 kg)   LMP 01/23/1988 (Approximate)   SpO2 95%   BMI 23.99 kg/m  Physical Exam Constitutional:      General: She is not in acute distress.    Appearance: Normal appearance. She is not ill-appearing.  HENT:     Head: Normocephalic and atraumatic.     Mouth/Throat:     Mouth: Mucous membranes are moist.     Pharynx: Oropharynx is clear.  Cardiovascular:     Rate and Rhythm: Normal rate and regular rhythm.  Pulmonary:     Effort: Pulmonary effort is normal.     Comments: CTA Skin:    General: Skin is warm and dry.  Neurological:     General: No focal deficit present.     Mental Status: She is alert and oriented to person, place, and time. Mental status is at baseline.  Psychiatric:        Mood and Affect: Mood normal.        Behavior: Behavior normal.        Thought Content: Thought content normal.        Judgment: Judgment normal.      Lab Results:  CBC    Component Value Date/Time   WBC 8.3 09/27/2021 1627   RBC 4.31 09/27/2021 1627   HGB 12.7 09/27/2021 1627   HGB 12.7 06/07/2020 1549   HCT 38.8 09/27/2021 1627   HCT 37.1 06/07/2020 1549   PLT 397.0 09/27/2021 1627   PLT 222 06/07/2020 1549   MCV 90.0 09/27/2021 1627   MCV 91 06/07/2020 1549   MCH 30.3 02/03/2021 1045   MCHC 32.8 09/27/2021 1627   RDW 13.4 09/27/2021 1627   RDW 12.2 06/07/2020 1549   LYMPHSABS 0.9 09/27/2021 1627   MONOABS 0.8 09/27/2021 1627   EOSABS 0.2 09/27/2021 1627   BASOSABS 0.0 09/27/2021 1627    BMET    Component Value  Date/Time   NA 142 07/26/2021 1011   NA 141 03/23/2021 1509   K 4.0 07/26/2021 1011   CL 107 07/26/2021 1011   CO2 29 07/05/2021 1424   GLUCOSE 105 (H) 07/26/2021 1011   BUN 41 (H) 07/26/2021 1011   BUN 22 03/23/2021 1509   CREATININE 1.00 07/26/2021 1011   CREATININE 1.12 (H) 12/23/2019 1411  CALCIUM 9.4 07/05/2021 1424   GFRNONAA 47 (L) 02/05/2021 0140   GFRNONAA 50 (L) 12/23/2019 1411   GFRAA 66 10/14/2018 1434    BNP No results found for: "BNP"  ProBNP    Component Value Date/Time   PROBNP 1,089 (H) 03/23/2021 1509    Imaging: CT CHEST HIGH RESOLUTION  Result Date: 10/03/2021 CLINICAL DATA:  Interstitial lung disease, drug induced pneumonitis EXAM: CT CHEST WITHOUT CONTRAST TECHNIQUE: Multidetector CT imaging of the chest was performed following the standard protocol without intravenous contrast. High resolution imaging of the lungs, as well as inspiratory and expiratory imaging, was performed. RADIATION DOSE REDUCTION: This exam was performed according to the departmental dose-optimization program which includes automated exposure control, adjustment of the mA and/or kV according to patient size and/or use of iterative reconstruction technique. COMPARISON:  05/02/2021 FINDINGS: Cardiovascular: Aortic atherosclerosis. Cardiomegaly. Three-vessel coronary artery calcifications. No pericardial effusion. Mediastinum/Nodes: Unchanged prominent mediastinal and hilar lymph nodes. Thyroid gland, trachea, and esophagus demonstrate no significant findings. Lungs/Pleura: In comparison to prior examination dated 05/02/2021, there are multiple fluctuant bilateral upper lobe predominant irregular, somewhat geographic ground-glass and consolidative airspace opacities throughout the lungs, on today's exam with a more dense, consolidative appearance in general, with some suggestion of developing fibrotic change, particularly in the peripheral left upper lobe (series 4, image 91). Lobular air  trapping on expiratory phase imaging. No pleural effusion or pneumothorax. Upper Abdomen: No acute abnormality. Musculoskeletal: No chest wall abnormality. No acute osseous findings. IMPRESSION: 1. Fluctuant bilateral, upper lobe predominant irregular, somewhat geographic ground-glass and consolidative airspace opacities throughout the lungs, on today's exam with a more dense, consolidative appearance when compared to prior examination dated 05/02/2021, with some suggestion of developing fibrotic change. Unusual findings are generally nonspecific and infectious or inflammatory, and particularly would be in keeping with reported history of drug reaction, general differential considerations with this appearance including eosinophilic pneumonia, organizing pneumonia, and atypical infection. 2. Lobular air trapping on expiratory phase imaging consistent with small airways disease. 3. Coronary artery disease. Aortic Atherosclerosis (ICD10-I70.0). Electronically Signed   By: Delanna Ahmadi M.D.   On: 10/03/2021 13:32   DG Chest 2 View  Result Date: 09/28/2021 CLINICAL DATA:  Shortness of breath. EXAM: CHEST - 2 VIEW COMPARISON:  Jun 07, 2021 FINDINGS: The heart size and mediastinal contours are within normal limits. Patchy consolidation identified in bilateral upper lobes and left lower lobe. There is no pulmonary edema or pleural effusion. The visualized skeletal structures are stable. IMPRESSION: Bilateral pneumonias. Follow-up after treatment is recommended to ensure resolution. Electronically Signed   By: Abelardo Diesel M.D.   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 She was on prednisone from end of April until end of July. Her shortness of breath symptoms have recently worsened after stopping prednisone two weeks ago. CXR on 09/27/21 showed bilateral pneumonia,  treated with course of doxycycline '100mg'$  BID x 10 days. We will get follow-up high resolution CT imaging scheduled that was ordered in August. For now holding off on additional steroids.  Addendum- HRCT on 10/02/21 showed upper lobe prodominate irregular GGO and consolidative airspace opacities throught the lungs suggestive of developing fibrotic changes. Findings are non-specific and generally related to infectious or inflammatory etiology, differential including eosinophilic pneumonia, orgnaizing pneumonia and atypical pneumona. Discuss results with Dr. Erin Fulling, he recommends that we have patient scheduled for  bronchoscopy for diagnostic purpose. Reviewed with patient risks of procedure including bleeding, infection, lung collapse, respiratory failure. She will hold Eliquis two days prior.   Martyn Ehrich, NP 10/10/2021

## 2021-09-27 NOTE — Telephone Encounter (Signed)
Can we follow up on HRCT imaging that was ordered in August

## 2021-09-27 NOTE — Patient Instructions (Addendum)
- I have reached out to our patient care coordinator to see about getting high-resolution CAT scan scheduled - We will check a basic chest x-ray today and labs d/t dyspnea symptoms. Please go to [address] to have labs drawn (basement level)  - Holding off on restarting steroids until testing comes back and I speak with Dr. Erin Fulling. If testing looks normal recommend getting breathing test called pulmonary function testing   Orders - Chest x-ray re: dyspnea  - Labs today ([facility])  Follow-up - We will follow-up after testing to determine when you need to return to see Dr. Erin Fulling   Flexible Bronchoscopy  Flexible bronchoscopy is a procedure used to examine the passageways in the lungs. During the procedure, a thin, flexible tool with a camera (bronchoscope) is passed into the mouth or nose, down through the windpipe (trachea), and into the air tubes in the lungs (bronchi). This tool allows the health care provider to look inside the lungs and to take samples for testing, if needed. Tell a health care provider about: Any allergies you have. All medicines you are taking, including vitamins, herbs, eye drops, creams, and over-the-counter medicines. Any problems you or family members have had with anesthetic medicines. Any bleeding problems you have. Any surgeries you have had. Any medical conditions you have. Whether you are pregnant or may be pregnant. What are the risks? Your healthcare provider will talk with you about risks. These may include: Infection. Bleeding. Damage to other structures or organs. Allergic reactions to medicines. Collapsed lung (pneumothorax). Increased need for oxygen or difficulty breathing after the procedure. What happens before the procedure? When to stop eating and drinking Follow instructions from your health care provider about what you may eat and drink. These may include: 8 hours before your procedure Stop eating most foods. Do not eat meat,  fried foods, or fatty foods. Eat only light foods, such as toast or crackers. All liquids are okay except energy drinks and alcohol. 6 hours before your procedure Stop eating. Drink only clear liquids, such as water, clear fruit juice, black coffee, plain tea, and sports drinks. Do not drink energy drinks or alcohol. 2 hours before your procedure Stop drinking all liquids. You may be allowed to take medicines with small sips of water. If you do not follow your health care provider's instructions, your procedure may be delayed or canceled. Medicines Ask your health care provider about: Changing or stopping your regular medicines. These include any diabetes medicines or blood thinners you take. Taking medicines such as aspirin and ibuprofen. These medicines can thin your blood. Do not take them unless your health care provider tells you to. Taking over-the-counter medicines, vitamins, herbs, and supplements. General instructions You may be given antibiotic medicine to help lower the risk of infection. If you will be going home right after the procedure, plan to have a responsible adult: Take you home from the hospital or clinic. You will not be allowed to drive. Care for you for the time you are told. What happens during the procedure? An IV will be inserted into one of your veins. You will be given a medicine (local anesthetic) to numb your mouth, nose, throat, and voice box (larynx). You may also be given one or more of the following: A medicine to help you relax (sedative). A medicine to control coughing. A medicine to dry up any fluids or secretions in your lungs. A bronchoscope will be passed into your nose or mouth, and  into your lungs. Your health care provider will examine your lungs. Samples of airway secretions may be collected for testing. If abnormal areas are seen in your airways, samples of tissue may be removed and checked under a microscope (biopsy). If tissue samples are  needed from the outer parts of the lung, a type of X-ray (fluoroscopy) may be used to guide the bronchoscope to these areas. If bleeding occurs, you may be given medicine to stop or decrease the bleeding. The procedure may vary among health care providers and hospitals. What happens after the procedure? Your blood pressure, heart rate, breathing rate, and blood oxygen level will be monitored until you leave the hospital or clinic. You may have a chest X-ray to check for signs of pneumothorax. You willnot be allowed to eat or drink anything for 2 hours after your procedure. If a biopsy was taken, it is up to you to get the results of the test. Ask your health care provider, or the department that is doing the procedure, when your results will be ready. Contact a health care provider if: You have a fever. Get help right away if: You have shortness of breath that gets worse. You get light-headed or feel like you might faint. You have chest pain. You cough up more than a small amount of blood. These symptoms may be an emergency. Get help right away. Call 911. Do not wait to see if the symptoms will go away. Do not drive yourself to the hospital. Summary Flexible bronchoscopy is a procedure that allows your health care provider to look closely inside your lungs and to take testing samples if needed. Risks of flexible bronchoscopy include bleeding, infection, and collapsed lung (pneumothorax). Before the procedure, you will be given a medicine to numb your mouth, nose, throat, and voice box. Then, a bronchoscope will be passed into your nose or mouth, and into your lungs. After the procedure, your blood pressure, heart rate, breathing rate, and blood oxygen level will be monitored until you leave the hospital or clinic. You may have a chest X-ray to check for signs of pneumothorax. This information is not intended to replace advice given to you by your health care provider. Make sure you discuss any  questions you have with your health care provider. Document Revised: 04/18/2021 Document Reviewed: 04/18/2021 Elsevier Patient Education  [place].

## 2021-09-27 NOTE — H&P (View-Only) (Signed)
$'@Patient'S$  ID: Anna Mathews, female    DOB: February 02, 1940, 81 y.o.   MRN: [id number]  Chief Complaint  Patient presents with   Follow-up    Pt states she is having SOB with exertion.     Referring provider: No ref. provider found  HPI: 81 year old woman, former smoker with atrial fibrillation, GERD, hiatal hernia, and hypertension who returns to pulmonary clinic for organizing pneumonia.  Patient of Dr. Erin Fulling, last seen in office on 08/24/2021.  Previous [facility] pulmonary encounter:  OV 07/12/21 She continues to feel much better. She is accompanied by her friend today.   She is currently on '30mg'$  of prednisone daily and bactrim DS 1 tab 3 days per week.   Chest radiograph at last visit showed improved infiltrates.  OV 06/07/21 She was started on high dose prednisone taper on 05/12/21 empirically for concern of organizing pneumonia vs pneumonitis related to hydralazine exposure.   She has been on '40mg'$  prednisone and is feeling much better. Her fatigue has resolved and she denies cough. She has mild dyspnea but overal much improved. She has started taking bactrim DS 3 times per week for pneumocystis prophylaxis.   OV 05/08/21 She reports having shortness of breath since the beginning of the year which progressed in mid to late February. She was seeing her cardiology team and her blood pressure medications were being titrated at that time. There was concern she had drug induced rash secondary to amlodipine but it seems that the rash persisted despite switching off this medication. 1/13 for hypertensive urgency but no chest imaging performed at that time. She was given '15mg'$  of IV hydralazine in the ED on 02/03/21.   She has been adjusting many different antihypertensive agents with the cardiology team. She was started on hydralazine '10mg'$  BID on 2/7 then called the cardiology office 2/10 with reports of redness on her arms/legs with a burning sensation. The dose was reduced then ultimately stopped on  2/13.   She continued to have progressive dyspnea in early March. She was started on lasix at that time with no improvement. A chest x-ray 03/28/21 was obtained which showed which showed right lower lobe and left perihilar opacities concerning for pneumonia. She was treated with doxycyline without much improvement. She continued to feel weak with low energy. She was given levaquin by her primary care on 3/23 which did not help improve her symptoms. She had x-ray 3/28 which showed progressive multifocal infiltrates. She was then treated with doxycycline again with no improvement. X ray 4/4 showed improvement of opacities on right but persistent left sided opacities.   CT Chest 4/11 showed multiple ground glass opacities in both upper lobes and right lower lobe.   She continues to have dyspnea, dry cough and fatigue. She has intermittent nausea. She has lost 12-15lbs since last fall. She denies any joint pains and no other skin rashes.   She quit smoking in 1970. Denies any harmful occupational exposures. Her father had emphysema.   09/27/2021 - Interim hx  Patient presents today for acute overview.  Past medical history significant for A-fib, hiatal hernia, hypertension, pneumonitis.  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  Started prednisone end of April and completed taper on 08/19/2021.  Her respiratory status returned to baseline at that time.  She was ordered for high-resolution CAT scan during her last visit to follow-up on pneumonitis.  Dr. Erin Fulling also recommended sleep testing given  her history of hypertension and A-fib.   Today she comes in for follow-up. Shortness of breath worsened about 2 weeks after completing prednisone taper end of July. Associated chest tightness and nocturnal wheezing. Heat exacerbates her dyspnea. She has a dog whom she walks twice a day. She saw cardiology end of  August for Afib. She is on several blood pressure medications. They discontinued diltiazem d/t itching. She was in sinus rhythm during that visit. Maintained on flecainide, bisoprolol and Eliquis. Needs HRCT and sleep study.    CT Chest 05/02/21 Mediastinum/Nodes: 2.4 cm left thyroid nodule is noted. Esophagus is unremarkable. Mildly enlarged precarinal adenopathy is noted which most likely is reactive or inflammatory in etiology.   Lungs/Pleura: No pneumothorax or pleural effusion is noted. Multiple ground-glass opacities are noted in both upper lobes and to some degree the right lower lobe, which may represent multifocal inflammation or sequela of prior infection. Bilateral lower lobe opacities are noted, right greater than left, concerning for pneumonia.    Allergies  Allergen Reactions   Atorvastatin Other (See Comments)    Other reaction(s): messed with her liver   Crab [Shellfish Allergy] Itching    Full body itching   Penicillins Other (See Comments)    REACTION: "ITCHING IN EYES" Has patient had a PCN reaction causing immediate rash, facial/tongue/throat swelling, SOB or lightheadedness with hypotension: Unknown Has patient had a PCN reaction causing severe rash involving mucus membranes or skin necrosis: Unknown Has patient had a PCN reaction that required hospitalization: No Has patient had a PCN reaction occurring within the last 10 years: No If all of the above answers are "NO", then may proceed with Cephalosporin use.    Diltiazem Other (See Comments)    Rash, burning and itching   Amlodipine Itching    Purpura rash   Sulfa Antibiotics Other (See Comments)    REACTION: " NOT SURE" Other reaction(s): eyes itch    Immunization History  Administered Date(s) Administered   Influenza Split 11/03/2010, 10/23/2011, 09/24/2013, 11/21/2014, 11/09/2016, 11/23/2020   Influenza, High Dose Seasonal PF 11/07/2016, 10/18/2017, 09/22/2018   Influenza,inj,quad, With Preservative  11/07/2016   Influenza-Unspecified 11/03/2010, 10/23/2011, 09/24/2013, 11/21/2014, 11/07/2016   Moderna Covid-19 Vaccine Bivalent Booster 45yr & up 11/16/2020   Moderna Sars-Covid-2 Vaccination 02/04/2019, 03/04/2019, 11/05/2019, 06/22/2020   Pneumococcal Conjugate-13 11/17/2013   Pneumococcal Polysaccharide-23 01/08/2005   Td 03/12/2014   Tdap 08/16/2009   Zoster Recombinat (Shingrix) 05/13/2017, 12/22/2017   Zoster, Live 05/20/2006, 12/22/2017    Past Medical History:  Diagnosis Date   [place]rial fibrillation [place] 09/2009   Breast cancer [place]    Diverticulosis 09/2006   GERD (gastroesophageal reflux disease)    Hiatal hernia    Hyperlipidemia    Hypertension    Osteopenia    Postmenopausal hormone replacement therapy 1989 - 07/2000    Tobacco History: Social History   Tobacco Use  Smoking Status Former   Packs/day: 0.25   Years: 12.00   Total pack years: 3.00   Types: Cigarettes   Start date: 01/16/1957   Quit date: 01/23/1968   Years since quitting: 53.7  Smokeless Tobacco Never  Tobacco Comments   Former smoker 08/04/21   Counseling given: Not Answered Tobacco comments: Former smoker 08/04/21   Outpatient Medications Prior to Visit  Medication Sig Dispense Refill   apixaban (ELIQUIS) 5 MG TABS tablet Take 1 tablet (5 mg total) by mouth 2 (two) times daily. 60 tablet 3   bisoprolol (ZEBETA) 5 MG tablet Take 1 tablet (5 mg  total) by mouth daily. 30 tablet 3   busPIRone (BUSPAR) 15 MG tablet Take 7.5 mg by mouth 2 (two) times daily.     flecainide (TAMBOCOR) 150 MG tablet Take 150 mg by mouth 2 (two) times daily.     furosemide (LASIX) 20 MG tablet TAKE 1 TABLET BY MOUTH EVERY DAY (Patient taking differently: Take 20 mg by mouth daily as needed for fluid.) 90 tablet 3   Omega-3 Fatty Acids (FISH OIL) 1200 MG CAPS Take 1,200 mg by mouth in the morning and at bedtime.     Multiple Vitamin (MULTIVITAMIN) tablet Take 1 tablet by mouth daily.     No  facility-administered medications prior to visit.    Review of Systems  Review of Systems  Constitutional: Negative.   HENT: Negative.    Respiratory:  Positive for chest tightness and shortness of breath. Negative for cough and wheezing.        Nocturnal wheezing      Physical Exam  BP 138/72 (BP Location: Left Arm, Patient Position: Sitting, Cuff Size: Normal)   Pulse 68   Temp 98.8 F (37.1 C) (Oral)   Ht '5\' 3"'$  (1.6 m)   Wt 135 lb 6.4 oz (61.4 kg)   LMP 01/23/1988 (Approximate)   SpO2 95%   BMI 23.99 kg/m  Physical Exam Constitutional:      General: She is not in acute distress.    Appearance: Normal appearance. She is not ill-appearing.  HENT:     Head: Normocephalic and atraumatic.     Mouth/Throat:     Mouth: Mucous membranes are moist.     Pharynx: Oropharynx is clear.  Cardiovascular:     Rate and Rhythm: Normal rate and regular rhythm.  Pulmonary:     Effort: Pulmonary effort is normal.     Comments: CTA Skin:    General: Skin is warm and dry.  Neurological:     General: No focal deficit present.     Mental Status: She is alert and oriented to person, place, and time. Mental status is at baseline.  Psychiatric:        Mood and Affect: Mood normal.        Behavior: Behavior normal.        Thought Content: Thought content normal.        Judgment: Judgment normal.      Lab Results:  CBC    Component Value Date/Time   WBC 8.3 09/27/2021 1627   RBC 4.31 09/27/2021 1627   HGB 12.7 09/27/2021 1627   HGB 12.7 06/07/2020 1549   HCT 38.8 09/27/2021 1627   HCT 37.1 06/07/2020 1549   PLT 397.0 09/27/2021 1627   PLT 222 06/07/2020 1549   MCV 90.0 09/27/2021 1627   MCV 91 06/07/2020 1549   MCH 30.3 02/03/2021 1045   MCHC 32.8 09/27/2021 1627   RDW 13.4 09/27/2021 1627   RDW 12.2 06/07/2020 1549   LYMPHSABS 0.9 09/27/2021 1627   MONOABS 0.8 09/27/2021 1627   EOSABS 0.2 09/27/2021 1627   BASOSABS 0.0 09/27/2021 1627    BMET    Component Value  Date/Time   NA 142 07/26/2021 1011   NA 141 03/23/2021 1509   K 4.0 07/26/2021 1011   CL 107 07/26/2021 1011   CO2 29 07/05/2021 1424   GLUCOSE 105 (H) 07/26/2021 1011   BUN 41 (H) 07/26/2021 1011   BUN 22 03/23/2021 1509   CREATININE 1.00 07/26/2021 1011   CREATININE 1.12 (H) 12/23/2019 1411  CALCIUM 9.4 07/05/2021 1424   GFRNONAA 47 (L) 02/05/2021 0140   GFRNONAA 50 (L) 12/23/2019 1411   GFRAA 66 10/14/2018 1434    BNP No results found for: "BNP"  ProBNP    Component Value Date/Time   PROBNP 1,089 (H) 03/23/2021 1509    Imaging: CT CHEST HIGH RESOLUTION  Result Date: 10/03/2021 CLINICAL DATA:  Interstitial lung disease, drug induced pneumonitis EXAM: CT CHEST WITHOUT CONTRAST TECHNIQUE: Multidetector CT imaging of the chest was performed following the standard protocol without intravenous contrast. High resolution imaging of the lungs, as well as inspiratory and expiratory imaging, was performed. RADIATION DOSE REDUCTION: This exam was performed according to the departmental dose-optimization program which includes automated exposure control, adjustment of the mA and/or kV according to patient size and/or use of iterative reconstruction technique. COMPARISON:  05/02/2021 FINDINGS: Cardiovascular: Aortic atherosclerosis. Cardiomegaly. Three-vessel coronary artery calcifications. No pericardial effusion. Mediastinum/Nodes: Unchanged prominent mediastinal and hilar lymph nodes. Thyroid gland, trachea, and esophagus demonstrate no significant findings. Lungs/Pleura: In comparison to prior examination dated 05/02/2021, there are multiple fluctuant bilateral upper lobe predominant irregular, somewhat geographic ground-glass and consolidative airspace opacities throughout the lungs, on today's exam with a more dense, consolidative appearance in general, with some suggestion of developing fibrotic change, particularly in the peripheral left upper lobe (series 4, image 91). Lobular air  trapping on expiratory phase imaging. No pleural effusion or pneumothorax. Upper Abdomen: No acute abnormality. Musculoskeletal: No chest wall abnormality. No acute osseous findings. IMPRESSION: 1. Fluctuant bilateral, upper lobe predominant irregular, somewhat geographic ground-glass and consolidative airspace opacities throughout the lungs, on today's exam with a more dense, consolidative appearance when compared to prior examination dated 05/02/2021, with some suggestion of developing fibrotic change. Unusual findings are generally nonspecific and infectious or inflammatory, and particularly would be in keeping with reported history of drug reaction, general differential considerations with this appearance including eosinophilic pneumonia, organizing pneumonia, and atypical infection. 2. Lobular air trapping on expiratory phase imaging consistent with small airways disease. 3. Coronary artery disease. Aortic Atherosclerosis (ICD10-I70.0). Electronically Signed   By: Delanna Ahmadi M.D.   On: 10/03/2021 13:32   DG Chest 2 View  Result Date: 09/28/2021 CLINICAL DATA:  Shortness of breath. EXAM: CHEST - 2 VIEW COMPARISON:  Jun 07, 2021 FINDINGS: The heart size and mediastinal contours are within normal limits. Patchy consolidation identified in bilateral upper lobes and left lower lobe. There is no pulmonary edema or pleural effusion. The visualized skeletal structures are stable. IMPRESSION: Bilateral pneumonias. Follow-up after treatment is recommended to ensure resolution. Electronically Signed   By: Abelardo Diesel M.D.   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 She was on prednisone from end of April until end of July. Her shortness of breath symptoms have recently worsened after stopping prednisone two weeks ago. CXR on 09/27/21 showed bilateral pneumonia,  treated with course of doxycycline '100mg'$  BID x 10 days. We will get follow-up high resolution CT imaging scheduled that was ordered in August. For now holding off on additional steroids.  Addendum- HRCT on 10/02/21 showed upper lobe prodominate irregular GGO and consolidative airspace opacities throught the lungs suggestive of developing fibrotic changes. Findings are non-specific and generally related to infectious or inflammatory etiology, differential including eosinophilic pneumonia, orgnaizing pneumonia and atypical pneumona. Discuss results with Dr. Erin Fulling, he recommends that we have patient scheduled for  bronchoscopy for diagnostic purpose. Reviewed with patient risks of procedure including bleeding, infection, lung collapse, respiratory failure. She will hold Eliquis two days prior.   Martyn Ehrich, NP 10/10/2021

## 2021-09-28 NOTE — Telephone Encounter (Signed)
Called and left detailed message on VM (DPR) for patient to be aware Beth, NP would like her to get scheduled for CT scan.  Call back number left for patient to call with any questions.  Will route message to [facility]

## 2021-09-28 NOTE — Telephone Encounter (Signed)
Patient called back and is aware CT scan is being ordered and a [facility] will call her to inform of CT appointment.

## 2021-09-28 NOTE — Telephone Encounter (Signed)
Yes needs scheduled.

## 2021-09-28 NOTE — Telephone Encounter (Signed)
I called the patient and she had forgot about her CT scan and wants to know if you are still need a CT scan completed. Please advise.

## 2021-09-28 NOTE — Telephone Encounter (Addendum)
Prior auth expires on 9/14 - rescheduled pt to 9/11 at 3:00, arrive 2:30.  Spoke to pt & gave her appt info. Nothing further needed.

## 2021-09-28 NOTE — Telephone Encounter (Signed)
Pt no-showed for this appt. 

## 2021-09-29 ENCOUNTER — Telehealth: Payer: Self-pay | Admitting: Primary Care

## 2021-09-29 MED ORDER — LEVOFLOXACIN 500 MG PO TABS: 500.0000 mg | ORAL_TABLET | Freq: Every day | ORAL | 0 refills | Status: DC

## 2021-09-29 NOTE — Telephone Encounter (Signed)
Treating bilateral pneumonia with 7-day course of Levaquin.  Discussed with Dr. Erin Fulling, he recommended pushing out HRCT until November.  Can we please reschedule this

## 2021-09-29 NOTE — Telephone Encounter (Signed)
Martyn Ehrich, NP  09/29/2021  3:29 PM EDT     Can someone please call patient, I tried but no answer. CXR showed bilateral pneumonia. I will send in abx. Needs FU in 5-7 days.     Lm x1 for patient.

## 2021-09-29 NOTE — Progress Notes (Signed)
Can someone please call patient, I tried but no answer. CXR showed bilateral pneumonia. I will send in abx. Needs FU in 5-7 days.

## 2021-10-02 ENCOUNTER — Other Ambulatory Visit: Payer: Self-pay | Admitting: Cardiology

## 2021-10-02 ENCOUNTER — Telehealth: Payer: Self-pay | Admitting: Pulmonary Disease

## 2021-10-02 ENCOUNTER — Ambulatory Visit (HOSPITAL_COMMUNITY)
Admission: RE | Admit: 2021-10-02 | Discharge: 2021-10-02 | Disposition: A | Discharge status: Home / Self Care | Payer: Medicare PPO | Source: Ambulatory Visit | Attending: Pulmonary Disease | Admitting: Pulmonary Disease

## 2021-10-02 DIAGNOSIS — J849 Interstitial pulmonary disease, unspecified: Secondary | ICD-10-CM | POA: Diagnosis present

## 2021-10-02 MED ORDER — DOXYCYCLINE HYCLATE 100 MG PO TABS: 100.0000 mg | ORAL_TABLET | Freq: Two times a day (BID) | ORAL | 0 refills | Status: DC

## 2021-10-02 NOTE — Telephone Encounter (Signed)
Called and spoke with pt letting her know the info per BW and she verbalized understanding. Stated to pt that BW sent Rx for doxycycline to pharmacy for her and she verbalized understanding. Nothing further needed.

## 2021-10-02 NOTE — Telephone Encounter (Signed)
Called and spoke with patient about chest xray results. Nothing further needed

## 2021-10-02 NOTE — Telephone Encounter (Signed)
Patient calling back for xray results.Call back [phone].

## 2021-10-02 NOTE — Telephone Encounter (Signed)
Called and spoke with patient. Patient stated that she did not take the medicine that was called in because she was in A fib and the pharmacy told her it wasn't good for her to tak that right now.   Patient wants to know if something else could be called in for her.   BW, please advise.

## 2021-10-02 NOTE — Telephone Encounter (Signed)
There is conflicting research regarding increased risk for arrhthymias with flouroquinolones. She also has an allergy to PCN. We can change to doxycycline '100mg'$  twice daily x 10 days. Would not recommend adding azithromycin at this time to similar risk for arrhythmias. If PNA does not improve we may need to use one of these agents with caution.

## 2021-10-03 NOTE — Telephone Encounter (Signed)
I had previously scheduled pt and did not see this message.  Pt had her CT done yesterday.  Will route message back to BW.

## 2021-10-03 NOTE — Telephone Encounter (Signed)
Noted. Thanks.

## 2021-10-03 NOTE — Telephone Encounter (Signed)
Judeen Hammans did you reschedule this as requested?

## 2021-10-04 ENCOUNTER — Telehealth: Payer: Self-pay | Admitting: Primary Care

## 2021-10-04 MED ORDER — ALBUTEROL SULFATE HFA 108 (90 BASE) MCG/ACT IN AERS: 2.0000 | INHALATION_SPRAY | Freq: Four times a day (QID) | RESPIRATORY_TRACT | 6 refills | Status: DC | PRN

## 2021-10-04 NOTE — Telephone Encounter (Signed)
CT chest showed findings consistent with infection/inflammation, differential includes eosinophilic pneumonia, organizing pneumonia or atypical infection.   I have reached out to Dr. Erin Fulling to discuss potential need for further testing such as bronchoscopy vs repeating CT in 6-8 weeks. Likely will need to add prednisone and we can send in albuterol hfa. If shortness of breath worsens let us know.

## 2021-10-04 NOTE — Telephone Encounter (Signed)
Primary Pulmonologist: [facility] Last office visit and with whom: 09/27/2021 Volanda Napoleon What do we see them for (pulmonary problems): SOB, ILD Last OV assessment/plan:   Assessment & Plan:    No problem-specific Assessment & Plan notes found for this encounter.         Martyn Ehrich, NP 09/27/2021        Patient Instructions by Martyn Ehrich, NP at 09/27/2021 3:00 PM  Author: Martyn Ehrich, NP Author Type: Nurse Practitioner Filed: 09/27/2021  3:55 PM  Note Status: Addendum Cosign: Cosign Not Required Encounter Date: 09/27/2021  Editor: Martyn Ehrich, NP (Nurse Practitioner)      Prior Versions: 1. Martyn Ehrich, NP (Nurse Practitioner) at 09/27/2021  3:52 PM - Addendum   2. Martyn Ehrich, NP (Nurse Practitioner) at 09/27/2021  3:49 PM - Addendum   3. Martyn Ehrich, NP (Nurse Practitioner) at 09/27/2021  3:48 PM - Addendum   4. Martyn Ehrich, NP (Nurse Practitioner) at 09/27/2021  3:47 PM - Signed    - I have reached out to our patient care coordinator to see about getting high-resolution CAT scan scheduled - We will check a basic chest x-ray today and labs d/t dyspnea symptoms. Please go to [address] to have labs drawn (basement level)  - Holding off on restarting steroids until testing comes back and I speak with Dr. Erin Fulling. If testing looks normal recommend getting breathing test called pulmonary function testing    Orders - Chest x-ray re: dyspnea  - Labs today ([place])   Follow-up - We will follow-up after testing to determine when you need to return to see Dr. Erin Fulling         Orthostatic Vitals Recorded in This Encounter   09/27/2021  1533     Patient Position: Sitting  BP Location: Left Arm  Cuff Size: Normal   Instructions  - I have reached out to our patient care coordinator to see about getting high-resolution CAT scan scheduled - We will check a basic chest x-ray today and labs d/t dyspnea symptoms. Please go to [address] to have  labs drawn (basement level)  - Holding off on restarting steroids until testing comes back and I speak with Dr. Erin Fulling. If testing looks normal recommend getting breathing test called pulmonary function testing    Orders - Chest x-ray re: dyspnea  - Labs today ([place])   Follow-up - We will follow-up after testing to determine when you need to return to see Dr. Erin Fulling          After Visit Summary (Printed 09/27/2021)   Communications  View All Conversations on this Encounter  Media From this encounter Electronic signature on 09/27/2021 2:59 PM - 1 of 3 e-signatures recorded   Communication Routing History  None  No questionnaires available.         Was appointment offered to patient (explain)?  no   Reason for call:   Patient states she was prescribed Doxycycline, she has taken 4 tablets and each time she takes it about 30 minutes later she has difficulty taking a deep breath for about 2-3 hours.  She states she is better now.  She denies any swelling of lips or tongue or tingling of lips or tongue.  She is wanting to know if she needs an inhaler so she can take a deep breath.  Beth advise.  Thank you.  (examples of things to ask: : When  did symptoms start? Fever? Cough? Productive? Color to sputum? More sputum than usual? Wheezing? Have you needed increased oxygen? Are you taking your respiratory medications? What over the counter measures have you tried?)  Allergies  Allergen Reactions   Atorvastatin Other (See Comments)    Other reaction(s): messed with her liver   Crab [Shellfish Allergy] Itching    Full body itching   Penicillins Other (See Comments)    REACTION: "ITCHING IN EYES" Has patient had a PCN reaction causing immediate rash, facial/tongue/throat swelling, SOB or lightheadedness with hypotension: Unknown Has patient had a PCN reaction causing severe rash involving mucus membranes or skin necrosis: Unknown Has patient had a PCN reaction that  required hospitalization: No Has patient had a PCN reaction occurring within the last 10 years: No If all of the above answers are "NO", then may proceed with Cephalosporin use.    Diltiazem     Rash, burning and itching   Amlodipine Itching   Sulfa Antibiotics Other (See Comments)    REACTION: " NOT SURE" Other reaction(s): eyes itch    Immunization History  Administered Date(s) Administered   Influenza Split 11/03/2010, 10/23/2011, 09/24/2013, 11/21/2014, 11/09/2016, 11/23/2020   Influenza, High Dose Seasonal PF 11/07/2016, 10/18/2017, 09/22/2018   Influenza,inj,quad, With Preservative 11/07/2016   Influenza-Unspecified 11/03/2010, 10/23/2011, 09/24/2013, 11/21/2014, 11/07/2016   Moderna Covid-19 Vaccine Bivalent Booster 34yr & up 11/16/2020   Moderna Sars-Covid-2 Vaccination 02/04/2019, 03/04/2019, 11/05/2019, 06/22/2020   Pneumococcal Conjugate-13 11/17/2013   Pneumococcal Polysaccharide-23 01/08/2005   Td 03/12/2014   Tdap 08/16/2009   Zoster Recombinat (Shingrix) 05/13/2017, 12/22/2017   Zoster, Live 05/20/2006, 12/22/2017

## 2021-10-04 NOTE — Telephone Encounter (Signed)
Patient called to speak with the nurse regarding the doxycycline that she is taking.  She stated that she is having some breathing issues with this medication.  Please call patient to discuss.  CB# [phone]

## 2021-10-05 NOTE — Telephone Encounter (Signed)
Called and spoke with pt letting her know the info per BW and she verbalized understanding.

## 2021-10-06 ENCOUNTER — Telehealth: Payer: Self-pay | Admitting: Pulmonary Disease

## 2021-10-06 DIAGNOSIS — J849 Interstitial pulmonary disease, unspecified: Secondary | ICD-10-CM

## 2021-10-06 NOTE — Telephone Encounter (Signed)
Please let patient know we will move forward with bronchoscopy to help rule out issues with infection and we will consider taking transbronchial biopsies of the lungs. After we do the bronchoscopy we will likely need to resume steroids. She will need to hold her eliquis 2 days before the procedure to avoid bleeding risks.   Thanks, JD

## 2021-10-06 NOTE — Telephone Encounter (Signed)
Dr Erin Fulling,  Do you have a certain day that you are wanting this procedure done. Or do you want me to have the PCCs schedule it next available.   Im trying to make sure of the date before I call her about her Eliquis.   Please advise sir

## 2021-10-06 NOTE — Telephone Encounter (Signed)
I believe Judeen Hammans has scheduled the patient and asked her to hold the eliquis after last dose Sunday night   Thanks, JD

## 2021-10-06 NOTE — Telephone Encounter (Signed)
Procedure is scheduled. The [facility] spoke to patient already. Nothing further needed

## 2021-10-10 ENCOUNTER — Other Ambulatory Visit: Payer: Self-pay

## 2021-10-10 ENCOUNTER — Other Ambulatory Visit: Payer: Medicare PPO

## 2021-10-10 ENCOUNTER — Encounter (HOSPITAL_COMMUNITY): Payer: Self-pay | Admitting: Pulmonary Disease

## 2021-10-10 DIAGNOSIS — J849 Interstitial pulmonary disease, unspecified: Secondary | ICD-10-CM

## 2021-10-10 DIAGNOSIS — R9389 Abnormal findings on diagnostic imaging of other specified body structures: Secondary | ICD-10-CM | POA: Insufficient documentation

## 2021-10-10 NOTE — Telephone Encounter (Signed)
LM

## 2021-10-10 NOTE — Assessment & Plan Note (Addendum)
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 She was on prednisone from end of April until end of July. Her shortness of breath symptoms have recently worsened after stopping prednisone two weeks ago. CXR on 09/27/21 showed bilateral pneumonia, treated with course of doxycycline '100mg'$  BID x 10 days. We will get follow-up high resolution CT imaging scheduled that was ordered in August. For now holding off on additional steroids.  Addendum- HRCT on 10/02/21 showed upper lobe prodominate irregular GGO and consolidative airspace opacities throught the lungs suggestive of developing fibrotic changes. Findings are non-specific and generally related to infectious or inflammatory etiology, differential including eosinophilic pneumonia, orgnaizing pneumonia and atypical pneumona. Discuss results with Dr. Erin Fulling, he recommends that we have patient scheduled for bronchoscopy for diagnostic purpose. Reviewed with patient risks of procedure including bleeding, infection, lung collapse, respiratory failure. She will hold Eliquis two days prior.

## 2021-10-10 NOTE — Progress Notes (Signed)
PCP - [name] [place] - Dr Ronnie Doss  EP Cardiologist - Dr Curt Bears  Chest x-ray - 09/27/21 EKG - 09/20/21 Stress Test - 10/31/09 ECHO - 02/04/21 Cardiac Cath - n/a  ICD Pacemaker/Loop - n/a  Sleep Study -  n/a CPAP - none  Blood Thinner Instructions:  Last dose of Eliquis was on 10/08/21.    Anesthesia review: Yes  STOP now taking any Aspirin (unless otherwise instructed by your surgeon), Aleve, Naproxen, Ibuprofen, Motrin, Advil, Goody's, BC's, all herbal medications, fish oil, and all vitamins.   Coronavirus Screening Do you have any of the following symptoms:  Cough yes/no: No Fever (>100.7F)  yes/no: No Runny nose yes/no: No Sore throat yes/no: No Difficulty breathing/shortness of breath  Yes  Have you traveled in the last 14 days and where? yes/no: No  Patient verbalized understanding of instructions that were given via phone

## 2021-10-10 NOTE — Progress Notes (Signed)
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 She was on prednisone from end of April until end of July. Her shortness of breath symptoms have recently worsened after stopping prednisone two weeks ago. CXR on 09/27/21 showed bilateral pneumonia, treated with course of doxycycline '100mg'$  BID x 10 days. We will get follow-up high resolution CT imaging scheduled that was ordered in August. For now holding off on additional steroids. Addendum- HRCT on 10/02/21 showed upper lobe prodominate irregular GGO and consolidative airspace opacities throught the lungs suggestive of developing fibrotic changes. Findings are non-specific and generally related to infectious or inflammatory etiology, differential including eosinophilic pneumonia, orgnaizing pneumonia and atypical pneumona. Discuss results with Dr. Erin Fulling, he recommends that we have patient scheduled for bronchoscopy for diagnostic purpose. Reviewed with patient risks of procedure including bleeding, infection, lung collapse, respiratory failure. She will hold Eliquis two days prior."  Follows cardiology for history of paroxysmal atrial fibrillation/atypical atrial flutter on flecainide and Eliquis, HTN, TIA, labile BP.  Echo 01/2021 showed EF 60 to 65%, no significant valvular regurgitation or stenosis.  CBC 09/27/2021 reviewed, unremarkable.  Patient will need day of surgery labs and evaluation.  EKG 09/20/2021: Normal sinus rhythm.  Rate 64. Incomplete right bundle branch block  HRCT chest 10/02/2021: IMPRESSION: 1. Fluctuant bilateral, upper lobe predominant irregular, somewhat geographic ground-glass and consolidative airspace opacities throughout the lungs, on today's exam with a more dense, consolidative appearance when compared  to prior examination dated 05/02/2021, with some suggestion of developing fibrotic change. Unusual findings are generally nonspecific and infectious or inflammatory, and particularly would be in keeping with reported history of drug reaction, general differential considerations with this appearance including eosinophilic pneumonia, organizing pneumonia, and atypical infection. 2. Lobular air trapping on expiratory phase imaging consistent with small airways disease. 3. Coronary artery disease.   Aortic Atherosclerosis (ICD10-I70.0).  24-hour blood pressure monitor 02/16/2021: Episodic spikes in BP over the course of 24 hours. Maximum systolic blood pressure 244 mmHg. 01% of systolic blood pressures greater than 140 mmHg. Diastolic blood pressures typically well controlled.   Continue management with the Pharm.D. hypertension clinic  TTE 02/04/21:  1. Left ventricular ejection fraction, by estimation, is 60 to 65%. The  left ventricle has normal function. The left ventricle has no regional  wall motion abnormalities. There is mild asymmetric left ventricular  hypertrophy of the basal-septal segment.  Left ventricular diastolic parameters are indeterminate.   2. Right ventricular systolic function is normal. The right ventricular  size is normal. There is normal pulmonary artery systolic pressure. The  estimated right ventricular systolic pressure is 02.7 mmHg.   3. Left atrial size was moderately dilated.   4. The mitral valve is degenerative. Trivial mitral valve regurgitation.  Moderate to severe mitral annular calcification.   5. The aortic valve is tricuspid. There is mild calcification of the  aortic valve. There is mild thickening of the aortic valve. Aortic valve  regurgitation is not visualized. Aortic valve sclerosis/calcification is  present, without any evidence of  aortic stenosis.   6. The inferior vena cava is normal in size with greater than 50%  respiratory  variability, suggesting right atrial pressure of 3 mmHg.   Comparison(s): No significant change from prior study.     Wynonia Musty [facility] Short Stay Center/Anesthesiology Phone [phone] 10/10/2021  1:01 PM

## 2021-10-10 NOTE — Anesthesia Preprocedure Evaluation (Signed)
Anesthesia Evaluation  Patient identified by MRN, date of birth, ID band Patient awake    Reviewed: Allergy & Precautions, NPO status , Patient's Chart, lab work & pertinent test results  History of Anesthesia Complications Negative for: history of anesthetic complications  Airway Mallampati: I  TM Distance: >3 FB Neck ROM: Full    Dental  (+) Dental Advisory Given   Pulmonary shortness of breath, former smoker,  Interstitial lung disease   breath sounds clear to auscultation       Cardiovascular hypertension, Pt. on medications (-) angina+ dysrhythmias Atrial Fibrillation  Rhythm:Irregular Rate:Normal  01/2021 ECHO: EF 60 to 65%. The LV has normal function, no regional wall motion abnormalities. There is mild asymmetric left ventricular hypertrophy of the basal-septal segment. Normal RVF, degenerative MV with annular calcification   Neuro/Psych Anxiety TIA   GI/Hepatic Neg liver ROS, GERD  Controlled,  Endo/Other  negative endocrine ROS  Renal/GU negative Renal ROS     Musculoskeletal   Abdominal   Peds  Hematology eliquis   Anesthesia Other Findings Breast cancer  Reproductive/Obstetrics                           Anesthesia Physical Anesthesia Plan  ASA: 3  Anesthesia Plan: General   Post-op Pain Management: Tylenol PO (pre-op)*   Induction: Intravenous  PONV Risk Score and Plan: 3 and Ondansetron, Dexamethasone and Treatment may vary due to age or medical condition  Airway Management Planned: Oral ETT  Additional Equipment: None  Intra-op Plan:   Post-operative Plan: Extubation in OR  Informed Consent: I have reviewed the patients History and Physical, chart, labs and discussed the procedure including the risks, benefits and alternatives for the proposed anesthesia with the patient or authorized representative who has indicated his/her understanding and acceptance.      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 She was on prednisone from end of April until end of July.Her shortness of breathsymptoms have recentlyworsened after stopping prednisone two weeks ago.CXR on 09/27/21 showed bilateral pneumonia, treated with course of doxycycline '100mg'$  BID x 10 days.We will getfollow-uphigh resolution CT imaging scheduled that was ordered in August.For now holding off on additional steroids. Addendum-HRCTon 9/11/23showed upper lobe prodominate irregular GGO and consolidative airspace opacities throught the lungs suggestive of developing fibrotic changes. Findingsarenon-specific and generally related toinfectious or inflammatoryetiology, differentialincluding eosinophilic pneumonia, orgnaizing pneumonia and atypical pneumona. Discuss results with Dr. Erin Fulling, herecommends that we have patient scheduled for bronchoscopy for diagnostic purpose.Reviewed with patient risks of procedure including bleeding, infection, lung collapse, respiratory failure. She will hold Eliquis two days prior."  Follows cardiology for history of paroxysmal atrial fibrillation/atypical atrial flutter on flecainide and Eliquis, HTN, TIA, labile BP.  Echo 01/2021 showed EF 60 to 65%, no significant valvular regurgitation or stenosis.  CBC 09/27/2021 reviewed, unremarkable.  Patient will need day of surgery labs and evaluation.  EKG 09/20/2021: Normal sinus rhythm.  Rate 64. Incomplete right bundle branch block  HRCT chest 10/02/2021: IMPRESSION: 1. Fluctuant bilateral, upper lobe predominant irregular, somewhat geographic ground-glass and consolidative airspace  opacities throughout the lungs, on today's exam with a more dense, consolidative appearance when compared to prior examination dated 05/02/2021, with some  suggestion of developing fibrotic change. Unusual findings are generally nonspecific and infectious or inflammatory, and particularly would be in keeping with reported history of drug reaction, general differential considerations with this appearance including eosinophilic pneumonia, organizing pneumonia, and atypical infection. 2. Lobular air trapping on expiratory phase imaging consistent with small airways disease. 3. Coronary artery disease.  Aortic Atherosclerosis (ICD10-I70.0).  24-hour blood pressure monitor 02/16/2021: . Episodic spikes in BP over the course of 24 hours. . Maximum systolic blood pressure 628 mmHg. 31% of systolic blood pressures greater than 140 mmHg. . Diastolic blood pressures typically well controlled.  Continue management with the Pharm.D. hypertension clinic  TTE 02/04/21: 1. Left ventricular ejection fraction, by estimation, is 60 to 65%. The  left ventricle has normal function. The left ventricle has no regional  wall motion abnormalities. There is mild asymmetric left ventricular  hypertrophy of the basal-septal segment.  Left ventricular diastolic parameters are indeterminate.  2. Right ventricular systolic function is normal. The right ventricular  size is normal. There is normal pulmonary artery systolic pressure. The  estimated right ventricular systolic pressure is 51.7 mmHg.  3. Left atrial size was moderately dilated.  4. The mitral valve is degenerative. Trivial mitral valve regurgitation.  Moderate to severe mitral annular calcification.  5. The aortic valve is tricuspid. There is mild calcification of the  aortic valve. There is mild thickening of the aortic valve. Aortic valve  regurgitation is not visualized. Aortic valve sclerosis/calcification is  present, without any evidence  of  aortic stenosis.  6. The inferior vena cava is normal in size with greater than 50%  respiratory variability, suggesting right atrial pressure of 3 mmHg.   Comparison(s): No significant change from prior study.    )     Anesthesia Quick Evaluation

## 2021-10-11 ENCOUNTER — Ambulatory Visit (HOSPITAL_BASED_OUTPATIENT_CLINIC_OR_DEPARTMENT_OTHER): Payer: Medicare PPO | Admitting: Physician Assistant

## 2021-10-11 ENCOUNTER — Ambulatory Visit (HOSPITAL_COMMUNITY): Payer: Medicare PPO

## 2021-10-11 ENCOUNTER — Ambulatory Visit (HOSPITAL_COMMUNITY): Payer: Medicare PPO | Admitting: Physician Assistant

## 2021-10-11 ENCOUNTER — Ambulatory Visit (HOSPITAL_COMMUNITY)
Admission: RE | Admit: 2021-10-11 | Discharge: 2021-10-11 | Disposition: A | Discharge status: Home / Self Care | Payer: Medicare PPO | Attending: Pulmonary Disease | Admitting: Pulmonary Disease

## 2021-10-11 ENCOUNTER — Encounter (HOSPITAL_COMMUNITY)
Admission: RE | Disposition: A | Discharge status: Home / Self Care | Payer: Self-pay | Source: Home / Self Care | Attending: Pulmonary Disease

## 2021-10-11 ENCOUNTER — Encounter (HOSPITAL_COMMUNITY): Payer: Self-pay | Admitting: Pulmonary Disease

## 2021-10-11 DIAGNOSIS — I484 Atypical atrial flutter: Secondary | ICD-10-CM | POA: Diagnosis not present

## 2021-10-11 DIAGNOSIS — Z87891 Personal history of nicotine dependence: Secondary | ICD-10-CM | POA: Insufficient documentation

## 2021-10-11 DIAGNOSIS — I10 Essential (primary) hypertension: Secondary | ICD-10-CM | POA: Diagnosis not present

## 2021-10-11 DIAGNOSIS — Z7901 Long term (current) use of anticoagulants: Secondary | ICD-10-CM | POA: Diagnosis not present

## 2021-10-11 DIAGNOSIS — Z8673 Personal history of transient ischemic attack (TIA), and cerebral infarction without residual deficits: Secondary | ICD-10-CM | POA: Insufficient documentation

## 2021-10-11 DIAGNOSIS — R0602 Shortness of breath: Secondary | ICD-10-CM | POA: Diagnosis not present

## 2021-10-11 DIAGNOSIS — Z20822 Contact with and (suspected) exposure to covid-19: Secondary | ICD-10-CM | POA: Diagnosis not present

## 2021-10-11 DIAGNOSIS — J849 Interstitial pulmonary disease, unspecified: Secondary | ICD-10-CM

## 2021-10-11 DIAGNOSIS — I4891 Unspecified atrial fibrillation: Secondary | ICD-10-CM | POA: Diagnosis not present

## 2021-10-11 DIAGNOSIS — Z79899 Other long term (current) drug therapy: Secondary | ICD-10-CM | POA: Diagnosis not present

## 2021-10-11 DIAGNOSIS — Z853 Personal history of malignant neoplasm of breast: Secondary | ICD-10-CM | POA: Diagnosis not present

## 2021-10-11 DIAGNOSIS — I480 Paroxysmal atrial fibrillation: Secondary | ICD-10-CM | POA: Insufficient documentation

## 2021-10-11 DIAGNOSIS — F419 Anxiety disorder, unspecified: Secondary | ICD-10-CM | POA: Diagnosis not present

## 2021-10-11 HISTORY — PX: BRONCHIAL BIOPSY: SHX5109

## 2021-10-11 HISTORY — DX: Anxiety disorder, unspecified: F41.9

## 2021-10-11 HISTORY — PX: BRONCHIAL WASHINGS: SHX5105

## 2021-10-11 HISTORY — DX: Dyspnea, unspecified: R06.00

## 2021-10-11 HISTORY — PX: VIDEO BRONCHOSCOPY: SHX5072

## 2021-10-11 LAB — BASIC METABOLIC PANEL
Anion gap: 9 (ref 5–15)
BUN: 27 mg/dL — ABNORMAL HIGH (ref 8–23)
CO2: 27 mmol/L (ref 22–32)
Calcium: 9.2 mg/dL (ref 8.9–10.3)
Chloride: 106 mmol/L (ref 98–111)
Creatinine, Ser: 0.86 mg/dL (ref 0.44–1.00)
GFR, Estimated: >60 mL/min (ref >60)
Glucose, Bld: 117 mg/dL — ABNORMAL HIGH (ref 70–99)
Potassium: 4.3 mmol/L (ref 3.5–5.1)
Sodium: 142 mmol/L (ref 135–145)

## 2021-10-11 LAB — SARS CORONAVIRUS 2 BY RT PCR: SARS Coronavirus 2 by RT PCR: NEGATIVE

## 2021-10-11 SURGERY — BRONCHOSCOPY, WITH FLUOROSCOPY
Anesthesia: General

## 2021-10-11 MED ORDER — FENTANYL CITRATE (PF) 250 MCG/5ML IJ SOLN
INTRAMUSCULAR | Status: DC | PRN
  Administered 2021-10-11 (×2): 50 ug via INTRAVENOUS

## 2021-10-11 MED ORDER — PHENYLEPHRINE HCL 0.25 % NA SOLN: 1.0000 | Freq: Four times a day (QID) | NASAL | Status: DC | PRN

## 2021-10-11 MED ORDER — PREDNISONE 10 MG PO TABS: 20.0000 mg | ORAL_TABLET | Freq: Every day | ORAL | 0 refills | Status: DC

## 2021-10-11 MED ORDER — LIDOCAINE HCL URETHRAL/MUCOSAL 2 % EX GEL: 1.0000 | Freq: Once | CUTANEOUS | Status: DC

## 2021-10-11 MED ORDER — ROCURONIUM BROMIDE 10 MG/ML (PF) SYRINGE
PREFILLED_SYRINGE | INTRAVENOUS | Status: DC | PRN
  Administered 2021-10-11: 50 mg via INTRAVENOUS

## 2021-10-11 MED ORDER — CHLORHEXIDINE GLUCONATE 0.12 % MT SOLN
15.0000 mL | Freq: Once | OROMUCOSAL | Status: AC
  Filled 2021-10-11: qty 15

## 2021-10-11 MED ORDER — HYDRALAZINE HCL 20 MG/ML IJ SOLN
INTRAMUSCULAR | Status: DC | PRN
  Administered 2021-10-11: 5 mg via INTRAVENOUS

## 2021-10-11 MED ORDER — LACTATED RINGERS IV SOLN: INTRAVENOUS | Status: DC

## 2021-10-11 MED ORDER — IPRATROPIUM-ALBUTEROL 0.5-2.5 (3) MG/3ML IN SOLN
RESPIRATORY_TRACT | Status: AC
  Filled 2021-10-11: qty 3

## 2021-10-11 MED ORDER — SUGAMMADEX SODIUM 200 MG/2ML IV SOLN
INTRAVENOUS | Status: DC | PRN
  Administered 2021-10-11: 150 mg via INTRAVENOUS

## 2021-10-11 MED ORDER — FENTANYL CITRATE (PF) 100 MCG/2ML IJ SOLN
INTRAMUSCULAR | Status: AC
  Filled 2021-10-11: qty 2

## 2021-10-11 MED ORDER — LIDOCAINE 2% (20 MG/ML) 5 ML SYRINGE
INTRAMUSCULAR | Status: DC | PRN
  Administered 2021-10-11: 80 mg via INTRAVENOUS

## 2021-10-11 MED ORDER — PROPOFOL 10 MG/ML IV BOLUS
INTRAVENOUS | Status: DC | PRN
  Administered 2021-10-11: 150 mg via INTRAVENOUS

## 2021-10-11 MED ORDER — EPHEDRINE SULFATE (PRESSORS) 50 MG/ML IJ SOLN
INTRAMUSCULAR | Status: DC | PRN
  Administered 2021-10-11: 5 mg via INTRAVENOUS

## 2021-10-11 MED ORDER — PHENYLEPHRINE 80 MCG/ML (10ML) SYRINGE FOR IV PUSH (FOR BLOOD PRESSURE SUPPORT)
PREFILLED_SYRINGE | INTRAVENOUS | Status: DC | PRN
  Administered 2021-10-11: 80 ug via INTRAVENOUS

## 2021-10-11 MED ORDER — CHLORHEXIDINE GLUCONATE 0.12 % MT SOLN
OROMUCOSAL | Status: AC
  Administered 2021-10-11: 15 mL via OROMUCOSAL
  Filled 2021-10-11: qty 15

## 2021-10-11 MED ORDER — PHENYLEPHRINE HCL-NACL 20-0.9 MG/250ML-% IV SOLN
INTRAVENOUS | Status: DC | PRN
  Administered 2021-10-11: 25 ug/min via INTRAVENOUS

## 2021-10-11 MED ORDER — DEXAMETHASONE SODIUM PHOSPHATE 10 MG/ML IJ SOLN
INTRAMUSCULAR | Status: DC | PRN
  Administered 2021-10-11: 4 mg via INTRAVENOUS

## 2021-10-11 MED ORDER — IPRATROPIUM-ALBUTEROL 0.5-2.5 (3) MG/3ML IN SOLN
3.0000 mL | Freq: Once | RESPIRATORY_TRACT | Status: AC
  Administered 2021-10-11: 3 mL via RESPIRATORY_TRACT

## 2021-10-11 MED ORDER — ONDANSETRON HCL 4 MG/2ML IJ SOLN
INTRAMUSCULAR | Status: DC | PRN
  Administered 2021-10-11: 4 mg via INTRAVENOUS

## 2021-10-11 MED ORDER — ACETAMINOPHEN 500 MG PO TABS: 1000.0000 mg | ORAL_TABLET | Freq: Once | ORAL | Status: DC

## 2021-10-11 MED ORDER — BUDESONIDE 0.5 MG/2ML IN SUSP
0.5000 mg | Freq: Once | RESPIRATORY_TRACT | Status: AC
  Administered 2021-10-11: 0.5 mg via RESPIRATORY_TRACT
  Filled 2021-10-11: qty 2

## 2021-10-11 NOTE — Op Note (Signed)
Bronchoscopy Procedure Note  Anna Mathews  [id number]  1940-07-22  Date:10/11/21  Time:3:50 PM   Provider Performing:Geovana Gebel B Bambie Pizzolato   Procedure(s):  Flexible bronchoscopy with bronchial alveolar lavage [phone]) and Transbronchial lung biopsy, single lobe ([id number])  Indication(s) Interstitial Lung Disease  Consent Risks of the procedure as well as the alternatives and risks of each were explained to the patient and/or caregiver.  Consent for the procedure was obtained and is signed in the bedside chart  Anesthesia General   Time Out Verified patient identification, verified procedure, site/side was marked, verified correct patient position, special equipment/implants available, medications/allergies/relevant history reviewed, required imaging and test results available.   Sterile Technique Usual hand hygiene, masks, gowns, and gloves were used   Procedure Description Bronchoscope advanced through endotracheal tube and into airway.  Airways were examined down to subsegmental level with findings noted below.   Following diagnostic evaluation, BAL(s) performed in LUL with normal saline and return of 47m fluid and Transbronchial biopsy(s) performed under fluoroscopic guidance in the LUL.  Findings:  - normal appearing airways   Complications/Tolerance None; patient tolerated the procedure well. Chest X-ray is not needed post procedure. Post procedure fluoro did not indicate pneumothorax.   EBL Minimal   Specimen(s) LUL BAL for cultures LUL transbronchial biopsies sent for cytology

## 2021-10-11 NOTE — Anesthesia Procedure Notes (Signed)
Procedure Name: Intubation Date/Time: 10/11/2021 2:44 PM  Performed by: Lowella Dell, CRNAPre-anesthesia Checklist: Patient identified, Emergency Drugs available, Suction available and Patient being monitored Patient Re-evaluated:Patient Re-evaluated prior to induction Oxygen Delivery Method: Circle System Utilized Preoxygenation: Pre-oxygenation with 100% oxygen Induction Type: IV induction Ventilation: Mask ventilation without difficulty Laryngoscope Size: Mac and 3 Grade View: Grade I Tube type: Oral Tube size: 8.5 mm Number of attempts: 1 Airway Equipment and Method: Stylet Placement Confirmation: ETT inserted through vocal cords under direct vision, positive ETCO2 and breath sounds checked- equal and bilateral Secured at: 22 cm Tube secured with: Tape Dental Injury: Teeth and Oropharynx as per pre-operative assessment

## 2021-10-11 NOTE — Anesthesia Postprocedure Evaluation (Signed)
Anesthesia Post Note  Patient: Anna Mathews  Procedure(s) Performed: VIDEO BRONCHOSCOPY WITH FLUORO BRONCHIAL BIOPSIES BRONCHIAL WASHINGS     Patient location during evaluation: PACU Anesthesia Type: General Level of consciousness: awake Pain management: pain level controlled Vital Signs Assessment: post-procedure vital signs reviewed and stable Respiratory status: spontaneous breathing Cardiovascular status: stable Postop Assessment: no apparent nausea or vomiting Anesthetic complications: no   No notable events documented.  Last Vitals:  Vitals:   10/11/21 1621 10/11/21 1630  BP: (!) 162/59 (!) 176/66  Pulse: (!) 56 (!) 59  Resp: (!) 21 19  Temp:    SpO2: 91% 97%    Last Pain:  Vitals:   10/11/21 1630  TempSrc:   PainSc: 0-No pain                 Jael Waldorf

## 2021-10-11 NOTE — Transfer of Care (Signed)
Immediate Anesthesia Transfer of Care Note  Patient: Anna Mathews  Procedure(s) Performed: VIDEO BRONCHOSCOPY WITH FLUORO BRONCHIAL BIOPSIES BRONCHIAL WASHINGS  Patient Location: Endoscopy Unit  Anesthesia Type:General  Level of Consciousness: drowsy  Airway & Oxygen Therapy: Patient Spontanous Breathing and Patient connected to face mask oxygen  Post-op Assessment: Report given to RN and Post -op Vital signs reviewed and stable  Post vital signs: Reviewed and stable. Pt hypertensive. Dr. Nyoka Cowden made aware. 5 mg hydralazine given.   Last Vitals:  Vitals Value Taken Time  BP 224/80 10/11/21 1547  Temp 36 C 10/11/21 1547  Pulse 55 10/11/21 1553  Resp 21 10/11/21 1553  SpO2 94 % 10/11/21 1553  Vitals shown include unvalidated device data.  Last Pain:  Vitals:   10/11/21 1547  TempSrc: Temporal  PainSc: Asleep         Complications: No notable events documented.

## 2021-10-11 NOTE — Discharge Instructions (Signed)
We will call you with the results when they return

## 2021-10-11 NOTE — Interval H&P Note (Signed)
History and Physical Interval Note:  10/11/2021 10:52 AM  Anna Mathews  has presented today for surgery, with the diagnosis of interstial lung disease.  The various methods of treatment have been discussed with the patient and family. After consideration of risks, benefits and other options for treatment, the patient has consented to  Procedure(s): VIDEO BRONCHOSCOPY WITH FLUORO (N/A) as a surgical intervention.  [name] history has been reviewed, patient examined, no change in status, stable for surgery.  I have reviewed the patient's chart and labs.  Questions were answered to the patient's satisfaction.     Freddi Starr

## 2021-10-13 LAB — CYTOLOGY - NON PAP

## 2021-10-13 LAB — SURGICAL PATHOLOGY

## 2021-10-14 LAB — ACID FAST SMEAR (AFB, MYCOBACTERIA): Acid Fast Smear: NEGATIVE

## 2021-10-14 LAB — CULTURE, BAL-QUANTITATIVE W GRAM STAIN: Culture: NO GROWTH

## 2021-10-15 ENCOUNTER — Other Ambulatory Visit: Payer: Self-pay | Admitting: Cardiology

## 2021-10-17 ENCOUNTER — Telehealth: Payer: Self-pay | Admitting: Pulmonary Disease

## 2021-10-17 DIAGNOSIS — J8489 Other specified interstitial pulmonary diseases: Secondary | ICD-10-CM

## 2021-10-17 LAB — ANAEROBIC CULTURE W GRAM STAIN

## 2021-10-18 MED ORDER — PREDNISONE 10 MG PO TABS: 20.0000 mg | ORAL_TABLET | Freq: Every day | ORAL | 1 refills | Status: DC

## 2021-10-18 MED ORDER — SULFAMETHOXAZOLE-TRIMETHOPRIM 800-160 MG PO TABS: 1.0000 | ORAL_TABLET | ORAL | 1 refills | Status: DC

## 2021-10-18 NOTE — Telephone Encounter (Signed)
Patient would like to know if she should get the Covid 19 and Flu shot. Patient phone number is [phone].

## 2021-10-18 NOTE — Telephone Encounter (Signed)
I spoke with patient about her bronchoscopy results.   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   She is to continue '20mg'$  prednisone for 1 month with bactrims DS ppx 1 tab 3 days per week. Prescriptions sent in.  Please schedule her for follow up with me end of October.  Thanks, JD

## 2021-10-19 ENCOUNTER — Telehealth (HOSPITAL_COMMUNITY): Payer: Self-pay | Admitting: *Deleted

## 2021-10-19 NOTE — Telephone Encounter (Signed)
-----   Message from Stanton Kidney, RN sent at 10/18/2021  2:01 PM EDT ----- Regarding: FW: Lung Inflammation Pt made aware their office will contact her to arrange appt.  ----- Message ----- From: Constance Haw, MD Sent: 10/07/2021   2:09 PM EDT To: Stanton Kidney, RN; Freddi Starr, MD Subject: RE: Lung Inflammation                          Sounds good.  I will have her follow-up in A-fib clinic to talk about alternative antiarrhythmics.  Thanks. ----- Message ----- From: Freddi Starr, MD Sent: 10/06/2021   8:17 AM EDT To: Constance Haw, MD Subject: Lung Inflammation                              Hi Will,  She did well with the steroid taper with improvement in her chest radiographs but has regressed after completing steroid taper. Recent CT scan shows scattered areas of inflammation. I think it would be reasonable to switch from flecainide to another agent given case reports of pneumonitis related to the medication from a search on pneumotox. We will be getting her in for bronchoscopy and biopsies soon.  Thanks for your help, Wille Glaser

## 2021-10-19 NOTE — Telephone Encounter (Signed)
Appt made

## 2021-10-20 ENCOUNTER — Ambulatory Visit: Payer: Medicare PPO | Attending: Interventional Cardiology | Admitting: Interventional Cardiology

## 2021-10-20 ENCOUNTER — Encounter: Payer: Self-pay | Admitting: Interventional Cardiology

## 2021-10-20 VITALS — BP 142/60 | HR 59 | Ht 63.0 in | Wt 137.4 lb

## 2021-10-20 DIAGNOSIS — I4819 Other persistent atrial fibrillation: Secondary | ICD-10-CM

## 2021-10-20 DIAGNOSIS — I700 Atherosclerosis of aorta: Secondary | ICD-10-CM

## 2021-10-20 DIAGNOSIS — Z7901 Long term (current) use of anticoagulants: Secondary | ICD-10-CM

## 2021-10-20 DIAGNOSIS — I10 Essential (primary) hypertension: Secondary | ICD-10-CM

## 2021-10-20 DIAGNOSIS — E782 Mixed hyperlipidemia: Secondary | ICD-10-CM | POA: Diagnosis not present

## 2021-10-20 DIAGNOSIS — R0989 Other specified symptoms and signs involving the circulatory and respiratory systems: Secondary | ICD-10-CM

## 2021-10-20 NOTE — Patient Instructions (Signed)
Medication Instructions:  Your physician recommends that you continue on your current medications as directed. Please refer to the Current Medication list given to you today.  *If you need a refill on your cardiac medications before your next appointment, please call your pharmacy*   Lab Work: none If you have labs (blood work) drawn today and your tests are completely normal, you will receive your results only by: MyChart Message (if you have MyChart) OR A paper copy in the mail If you have any lab test that is abnormal or we need to change your treatment, we will call you to review the results.   Testing/Procedures: none   Follow-Up: At [facility], you and your health needs are our priority.  As part of our continuing mission to provide you with exceptional heart care, we have created designated Provider Care Teams.  These Care Teams include your primary Cardiologist (physician) and Advanced Practice Providers (APPs -  Physician Assistants and Nurse Practitioners) who all work together to provide you with the care you need, when you need it.  We recommend signing up for the patient portal called "MyChart".  Sign up information is provided on this After Visit Summary.  MyChart is used to connect with patients for Virtual Visits (Telemedicine).  Patients are able to view lab/test results, encounter notes, upcoming appointments, etc.  Non-urgent messages can be sent to your provider as well.   To learn more about what you can do with MyChart, go to [email or web address].    Your next appointment:   12 month(s)  The format for your next appointment:   In Person  Provider:   Jayadeep Varanasi, MD     Other Instructions    Important Information About Sugar       

## 2021-10-20 NOTE — Progress Notes (Signed)
Cardiology Office Note   Date:  10/20/2021   ID:  Anna Mathews, DOB 01/31/1940, MRN [id number]  PCP:  Kathyrn Lass, MD    No chief complaint on file.  PAF  Wt Readings from Last 3 Encounters:  10/20/21 137 lb 6.4 oz (62.3 kg)  10/11/21 135 lb 5.8 oz (61.4 kg)  09/27/21 135 lb 6.4 oz (61.4 kg)       History of Present Illness: Anna Mathews is a 81 y.o. female  Who has AFib, maintaining NSR on flecainide.  We tried a lower dose of flecainide in the past but she had SHOB, so resumed the current dose.    Her husband died in 2015/06/14.  She has adjusted well. She moved and enjoys her downsized lifestyle.   She had a fall in 5/18 and hit her face and shoulder.  Head CT was negative.  She has a dislocated right shoulder.  It is being managed conservatively.  She did PT.   In June 2019, she went out of rhythm and had a cardioversion.     She saw Dr. Curt Bears in 2019 and medical therapy was continued.  She has followed with EP since then.   Since the last visit, she has had a lot of stress with her son's illness.  He has an autoimmune illness related to a flu shot 8 years ago.  He went on dialysis, had an amputation.  He worked as a Marine scientist, and has 3 kids.  He died in 21-Nov-2022.     She is vaccinated against COVID-19.  She has had issues with arthritic pain in her left neck.   In 2022, she developed a skin condition that causes severe rash.  It was thought to be related to amlodipine.  She was followed with dermatology.  She has had difficult to control blood pressure ever since.  She has tried multiple medications and has been followed in our Pharm.D. hypertension clinic.  We have retried amlodipine and her skin rash reappeared.   she presented to the hospital on 02/03/2021 with hypertensive urgency and a severe left-sided headache. Stroke work-up was negative. In the ED, she was hypertensive up to 220s/80s and had become hypotensive down to 80s/30s with bradycardia after a repeat dose of  hydralazine 5 mg IV. She had received 10 mg IV about 4 hours prior. Labetalol 5 mg q6h prn for SBP >180 and/or DBP >110 was added and pt was discharged home normotensive. Noted to have a systolic murmur on exam, and echo revealed an LVEF of 60-65%   On the 16th, pt reported BP remained elevated. She was advised to take an additional 75 mg of irbesartan if SBP > 150. Made patient aware that after 1 hour of additional dose if her SBP is still > 150, then she may take an additional 75 mg of irbesartan. Made patient aware that the max daily dose of irbesartan was 300 mg. She verbalized understanding.    At follow up visit on 1/17 in the Pharm.D. hypertension clinic, patient's blood pressure remained elevated, and irbesartan dose was increased to 150 mg daily and initiated on carvedilol 6.'25mg'$  BID. Referred to PharmD for f/u.   Pt sent in a message on 1/18 reporting a headache and BP readings ranging from 98-182/49-80s over the span of last 8 hours. She was advised to take an extra irbesartan '150mg'$  dose. She planned to bring BP readings and cuff to review with pharmacist.   Due to labile blood  pressure, would tolerate a goal of less than 140/90.  Irbesartan was stopped in January 2023.  Carvedilol 6.25 twice daily was continued.  If her systolic was less than 419, she would skip the dose of carvedilol.     Ultimately was changed to She came today with a list of BP saying that she is taking a half pill of metoprolol.  BPs at home in the last week have been between 622-297 systolic.   As of 09/2021: She is concerned that flecainide could also cause the skin reaction she had with amlodipine.    Home BP readings are better controlled. No bleding problems on ELiquis.   She had recurrent pneumonia in September 2023.  Bronchoscopy was required.  She is improving now.  She walks her dog.  That is most strenuous activity.       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   Hyperlipidemia    Hypertension    Osteopenia    Postmenopausal hormone replacement therapy 1989 - 07/2000    Past Surgical History:  Procedure Laterality Date   BREAST DUCTAL SYSTEM EXCISION Left 06/2002   duct ectasia with fibrosis - atypical lobular hyperplasia with micro calcifications - tamoxifen X 28 months then change to Evista 10/06   BREAST LUMPECTOMY WITH RADIOACTIVE SEED LOCALIZATION Right 01/27/2020   Procedure: RIGHT BREAST LUMPECTOMY WITH RADIOACTIVE SEED LOCALIZATION;  Surgeon: Coralie Keens, MD;  Location: [place];  Service: General;  Laterality: Right;   BREAST SURGERY Left 4/99   breast biopsy - fibrosis   BRONCHIAL BIOPSY  10/11/2021   Procedure: BRONCHIAL BIOPSIES;  Surgeon: Freddi Starr, MD;  Location: [place];  Service: Pulmonary;;   BRONCHIAL WASHINGS  10/11/2021   Procedure: BRONCHIAL WASHINGS;  Surgeon: Freddi Starr, MD;  Location: [place];  Service: Pulmonary;;   CARDIOVERSION N/A 07/16/2017   Procedure: CARDIOVERSION;  Surgeon: Jerline Pain, MD;  Location: [place] [facility];  Service: Cardiovascular;  Laterality: N/A;   CARDIOVERSION N/A 10/20/2018   Procedure: CARDIOVERSION;  Surgeon: Buford Dresser, MD;  Location: [place];  Service: Cardiovascular;  Laterality: N/A;   CATARACT EXTRACTION W/ INTRAOCULAR LENS IMPLANT Right    CESAREAN SECTION     X 2   HYSTEROSCOPY  4/95   w/D&C   TEE WITHOUT CARDIOVERSION N/A 10/20/2018   Procedure: TRANSESOPHAGEAL ECHOCARDIOGRAM (TEE);  Surgeon: Buford Dresser, MD;  Location: [facility] ENDOSCOPY;  Service: Cardiovascular;  Laterality: N/A;   VIDEO BRONCHOSCOPY N/A 10/11/2021   Procedure: VIDEO BRONCHOSCOPY WITH FLUORO;  Surgeon: Freddi Starr, MD;  Location: [facility] END[facility]COPY;  Service: Pulmonary;  Laterality: N/A;      Current Outpatient Medications  Medication Sig Dispense Refill   albuterol (VENTOLIN HFA) 108 (90 Base) MCG/ACT inhaler Inhale 2 puffs into the lungs every 6 (six) hours as needed for wheezing or shortness of breath. 8 g 6   apixaban (ELIQUIS) 5 MG TABS tablet Take 1 tablet (5 mg total) by mouth 2 (two) times daily. 60 tablet 3   bisoprolol (ZEBETA) 5 MG tablet Take 1 tablet (5 mg total) by mouth daily. 30 tablet 3   busPIRone (BUSPAR) 15 MG tablet Take 7.5 mg by mouth 2 (two) times daily.     Calcium Carbonate-Vit D-Min (CALCIUM 1200 PO) Take  1,200 mg by mouth daily.     flecainide (TAMBOCOR) 150 MG tablet TAKE 1 TABLET BY MOUTH TWICE A DAY 180 tablet 1   furosemide (LASIX) 20 MG tablet TAKE 1 TABLET BY MOUTH EVERY DAY (Patient taking differently: Take 20 mg by mouth daily as needed for fluid.) 90 tablet 3   Omega-3 Fatty Acids (FISH OIL) 1200 MG CAPS Take 1,200 mg by mouth in the morning and at bedtime.     predniSONE (DELTASONE) 10 MG tablet Take 2 tablets (20 mg total) by mouth daily with breakfast. 64 tablet 1   sulfamethoxazole-trimethoprim (BACTRIM DS) 800-160 MG tablet Take 1 tablet by mouth 3 (three) times a week. 12 tablet 1   No current facility-administered medications for this visit.    Allergies:   Atorvastatin, Crab [shellfish allergy], Penicillins, Diltiazem, and Amlodipine    Social History:  The patient  reports that she quit smoking about 53 years ago. Her smoking use included cigarettes. She started smoking about 64 years ago. She has a 3.00 pack-year smoking history. She has never used smokeless tobacco. She reports that she does not currently use alcohol. She reports that she does not use drugs.   Family History:  [name] family history includes Breast cancer in her maternal grandmother; COPD in her father; Diabetes in her maternal aunt, maternal aunt, maternal grandfather, and son; Heart disease in her father; Hypertension in her mother; Kidney cancer in her  brother; Rheum arthritis in her brother.    ROS:  Please see the history of present illness.   Otherwise, review of systems are positive for .   All other systems are reviewed and negative.    PHYSICAL EXAM: VS:  BP (!) 142/60   Pulse (!) 59   Ht '5\' 3"'$  (1.6 m)   Wt 137 lb 6.4 oz (62.3 kg)   LMP 01/23/1988 (Approximate)   SpO2 96%   BMI 24.34 kg/m  , BMI Body mass index is 24.34 kg/m. GEN: Well nourished, well developed, in no acute distress HEENT: normal Neck: no JVD, carotid bruits, or masses Cardiac: RRR; no murmurs, rubs, or gallops,no edema  Respiratory:  clear to auscultation bilaterally, normal work of breathing GI: soft, nontender, nondistended, + BS MS: no deformity or atrophy Skin: warm and dry, no rash Neuro:  Strength and sensation are intact Psych: euthymic mood, full affect   EKG:   The ekg ordered today demonstrates NSR, IRBBB   Recent Labs: 11/03/2020: TSH 3.71 03/23/2021: NT-Pro BNP 1,089 07/05/2021: ALT 20 09/27/2021: Hemoglobin 12.7; Platelets 397.0 10/11/2021: BUN 27; Creatinine, Ser 0.86; Potassium 4.3; Sodium 142   Lipid Panel No results found for: "CHOL", "TRIG", "HDL", "CHOLHDL", "VLDL", "LDLCALC", "LDLDIRECT"   Other studies Reviewed: Additional studies/ records that were reviewed today with results demonstrating: labs reviewed, LDL 91, HDL 77.   ASSESSMENT AND PLAN:  PAF: Maintaining NSR.  She did find out from her pulmonary doctor that flecainide could cause the same skin reaction as amlodipine.  Currently, she is doing well.  Her A-fib is under good control.  If this were to happen, would consider switching to Multaq. Acquired thrombophilia: Continue Eliquis for stroke prevention.  No bleeding problems.  Hemoglobin 12.7 in September 2023. Labile hypertension: Blood Pressure is more stable now.  Whole food plant-based diet.  High-fiber diet.  Avoid processed foods. Aortic atherosclerosis: Continue statin.  Continue regular activity. DOE: Now  with recurrent pneumonia.  Improving on antibiotics and steroids.   Current medicines are reviewed at length with  the patient today.  The patient concerns regarding her medicines were addressed.  The following changes have been made:  No change  Labs/ tests ordered today include:  No orders of the defined types were placed in this encounter.   Recommend 150 minutes/week of aerobic exercise Low fat, low carb, high fiber diet recommended  Disposition:   FU in 1 year   Signed, Larae Grooms, MD  10/20/2021 3:35 PM    [facility] [place], [place], [place]  [address] Phone: [phone]; Fax: [phone]

## 2021-10-26 ENCOUNTER — Telehealth: Payer: Self-pay | Admitting: Pulmonary Disease

## 2021-10-27 NOTE — Telephone Encounter (Signed)
Called and spoke with patient. She wanted to know if she should get her covid and RSV vaccines now while she has PNA. I advised her that it would be best for her to wait at least 2 weeks after her PNA clears before getting any vaccines. She verbalized understanding.   Nothing further needed at time of call.

## 2021-10-30 NOTE — Telephone Encounter (Signed)
See encounter on 10/05 in regards to her vaccines.

## 2021-11-03 ENCOUNTER — Ambulatory Visit (HOSPITAL_COMMUNITY)
Admission: RE | Admit: 2021-11-03 | Discharge: 2021-11-03 | Disposition: A | Discharge status: Home / Self Care | Payer: Medicare PPO | Source: Ambulatory Visit | Attending: Physician Assistant | Admitting: Physician Assistant

## 2021-11-03 VITALS — BP 142/62 | HR 58 | Ht 63.0 in | Wt 137.8 lb

## 2021-11-03 DIAGNOSIS — E785 Hyperlipidemia, unspecified: Secondary | ICD-10-CM | POA: Diagnosis not present

## 2021-11-03 DIAGNOSIS — J849 Interstitial pulmonary disease, unspecified: Secondary | ICD-10-CM | POA: Diagnosis not present

## 2021-11-03 DIAGNOSIS — I4892 Unspecified atrial flutter: Secondary | ICD-10-CM | POA: Diagnosis not present

## 2021-11-03 DIAGNOSIS — I10 Essential (primary) hypertension: Secondary | ICD-10-CM | POA: Insufficient documentation

## 2021-11-03 DIAGNOSIS — I4819 Other persistent atrial fibrillation: Secondary | ICD-10-CM | POA: Insufficient documentation

## 2021-11-03 DIAGNOSIS — Z7901 Long term (current) use of anticoagulants: Secondary | ICD-10-CM | POA: Diagnosis not present

## 2021-11-03 DIAGNOSIS — Z79899 Other long term (current) drug therapy: Secondary | ICD-10-CM | POA: Insufficient documentation

## 2021-11-03 DIAGNOSIS — D6869 Other thrombophilia: Secondary | ICD-10-CM | POA: Diagnosis not present

## 2021-11-03 NOTE — Progress Notes (Signed)
Primary Care Physician: Kathyrn Lass, MD Primary Cardiologist: Dr Irish Lack Primary Electrophysiologist: Dr Curt Bears Referring Physician: Oda Kilts PA   Anna Mathews is a 81 y.o. female with a history of HTN, HLD, ILD, atrial flutter, atrial fibrillation who presents for follow up in the [facility]. Patient is on Eliquis for a CHADS2VASC score of 4. She has been maintained on flecainide for rhythm control. She was seen 07/13/21 and found to be in atrial flutter. She presented for DCCV on 07/26/21 but had converted back to SR so the procedure was cancelled. Prior to the onset of her flutter, she had pneumonia and was on a steroid taper. Patient developed itching all over her body after starting diltiazem. This was discontinued and she was started on bisoprolol.   On follow up today, patient reports that she has done well from an afib standpoint since her last visit. However, she continues to have issues with her lungs/pneumonia. 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 She is currently on antibiotic and steroid for pneumonia. No bleeding issues on anticoagulation.   Today, she denies symptoms of palpitations, chest pain, orthopnea, PND, presyncope, syncope, snoring, daytime somnolence, bleeding, or neurologic sequela. The patient is tolerating medications without difficulties and is otherwise without complaint today.    Atrial Fibrillation Risk Factors:  she does not have symptoms or diagnosis of sleep apnea. she does not have a history of rheumatic fever.   she has a BMI of Body mass index is 24.41 kg/m.Marland Kitchen Filed Weights   11/03/21 1007  Weight: 62.5 kg    Family History  Problem Relation Age of Onset   Hypertension Mother    Heart disease Father    COPD Father    Diabetes Maternal Grandfather    Diabetes Son    Rheum arthritis Brother    Kidney cancer Brother    Breast cancer  Maternal Grandmother    Diabetes Maternal Aunt    Diabetes Maternal Aunt      Atrial Fibrillation Management history:  Previous antiarrhythmic drugs: flecainide Previous cardioversions: 2019, 2020 Previous ablations: none CHADS2VASC score: 4 Anticoagulation history: Eliquis   Past Medical History:  Diagnosis Date   Anxiety    Atrial fibrillation ([place]) 09/2009   Breast cancer ([place])    Diverticulosis 09/2006   Dyspnea    GERD (gastroesophageal reflux disease)    Hiatal hernia    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   Hyperlipidemia    Hypertension    Osteopenia    Postmenopausal hormone replacement therapy 1989 - 07/2000   Past Surgical History:  Procedure Laterality Date   BREAST DUCTAL SYSTEM EXCISION Left 06/2002   duct ectasia with fibrosis - atypical lobular hyperplasia with micro calcifications - tamoxifen X 28 months then change to Evista 10/06   BREAST LUMPECTOMY WITH RADIOACTIVE SEED LOCALIZATION Right 01/27/2020   Procedure: RIGHT BREAST LUMPECTOMY WITH RADIOACTIVE SEED LOCALIZATION;  Surgeon: Coralie Keens, MD;  Location: [place];  Service: General;  Laterality: Right;   BREAST SURGERY Left 4/99   breast biopsy - fibrosis   BRONCHIAL BIOPSY  10/11/2021   Procedure: BRONCHIAL BIOPSIES;  Surgeon: Freddi Starr, MD;  Location: [place];  Service: Pulmonary;;   BRONCHIAL WASHINGS  10/11/2021   Procedure: BRONCHIAL WASHINGS;  Surgeon: Freddi Starr, MD;  Location: [place];  Service: Pulmonary;;   CARDIOVERSION N/A 07/16/2017   Procedure: CARDIOVERSION;  Surgeon: Jerline Pain, MD;  Location:  [place] [facility];  Service: Cardiovascular;  Laterality: N/A;   CARDIOVERSION N/A 10/20/2018   Procedure: CARDIOVERSION;  Surgeon: Buford Dresser, MD;  Location: [facility] ENDOSCOPY;  Service: Cardiovascular;  Laterality: N/A;   CATARACT EXTRACTION W/ INTRAOCULAR LENS IMPLANT Right    CESAREAN SECTION     X 2   HYSTEROSCOPY  4/95   w/D&C   TEE WITHOUT  CARDIOVERSION N/A 10/20/2018   Procedure: TRANSESOPHAGEAL ECHOCARDIOGRAM (TEE);  Surgeon: Buford Dresser, MD;  Location: [facility];  Service: Cardiovascular;  Laterality: N/A;   VIDEO BRONCHOSCOPY N/A 10/11/2021   Procedure: VIDEO BRONCHOSCOPY WITH FLUORO;  Surgeon: Freddi Starr, MD;  Location[facility];  Service: Pulmonary;  Laterality: N/A;    Current Outpatient Medications  Medication Sig Dispense Refill   albuterol (VENTOLIN HFA) 108 (90 Base) MCG/ACT inhaler Inhale 2 puffs into the lungs every 6 (six) hours as needed for wheezing or shortness of breath. 8 g 6   apixaban (ELIQUIS) 5 MG TABS tablet Take 1 tablet (5 mg total) by mouth 2 (two) times daily. 60 tablet 3   bisoprolol (ZEBETA) 5 MG tablet Take 1 tablet (5 mg total) by mouth daily. 30 tablet 3   busPIRone (BUSPAR) 15 MG tablet Take 7.5 mg by mouth 2 (two) times daily.     flecainide (TAMBOCOR) 150 MG tablet TAKE 1 TABLET BY MOUTH TWICE A DAY 180 tablet 1   furosemide (LASIX) 20 MG tablet TAKE 1 TABLET BY MOUTH EVERY DAY (Patient taking differently: Take 20 mg by mouth daily as needed for fluid.) 90 tablet 3   Omega-3 Fatty Acids (FISH OIL) 1200 MG CAPS Take 1,200 mg by mouth in the morning and at bedtime.     predniSONE (DELTASONE) 10 MG tablet Take 2 tablets (20 mg total) by mouth daily with breakfast. 64 tablet 1   sulfamethoxazole-trimethoprim (BACTRIM DS) 800-160 MG tablet Take 1 tablet by mouth 3 (three) times a week. 12 tablet 1   Calcium Carbonate-Vit D-Min (CALCIUM 1200 PO) Take 1,200 mg by mouth daily. (Patient not taking: Reported on 11/03/2021)     No current facility-administered medications for this encounter.    Allergies  Allergen Reactions   Atorvastatin Other (See Comments)    Other reaction(s): messed with her liver   Crab [Shellfish Allergy] Itching    Full body itching   Penicillins Other (See Comments)    REACTION: "ITCHING IN EYES" Has patient had a PCN reaction causing immediate  rash, facial/tongue/throat swelling, SOB or lightheadedness with hypotension: Unknown Has patient had a PCN reaction causing severe rash involving mucus membranes or skin necrosis: Unknown Has patient had a PCN reaction that required hospitalization: No Has patient had a PCN reaction occurring within the last 10 years: No If all of the above answers are "NO", then may proceed with Cephalosporin use.    Diltiazem Other (See Comments)    Rash, burning and itching   Amlodipine Itching    Purpura rash    Social History   Socioeconomic History   Marital status: Widowed    Spouse name: Not on file   Number of children: 2   Years of education: Not on file   Highest education level: Not on file  Occupational History   Not on file  Tobacco Use   Smoking status: Former    Packs/day: 0.25    Years: 12.00    Total pack years: 3.00    Types: Cigarettes    Start date: 01/16/1957    Quit date: 01/23/1968  Years since quitting: 53.8   Smokeless tobacco: Never   Tobacco comments:    Former smoker 08/04/21  Vaping Use   Vaping Use: Never used  Substance and Sexual Activity   Alcohol use: Not Currently    Comment: social   Drug use: No   Sexual activity: Not Currently    Partners: Male    Birth control/protection: Post-menopausal  Other Topics Concern   Not on file  Social History Narrative   Not on file   Social Determinants of Health   Financial Resource Strain: Not on file  Food Insecurity: Not on file  Transportation Needs: Not on file  Physical Activity: Not on file  Stress: Not on file  Social Connections: Not on file  Intimate Partner Violence: Not on file     ROS- All systems are reviewed and negative except as per the HPI above.  Physical Exam: Vitals:   11/03/21 1007  BP: (!) 142/62  Pulse: (!) 58  Weight: 62.5 kg  Height: '5\' 3"'$  (1.6 m)    GEN- The patient is a well appearing elderly female, alert and oriented x 3 today.   HEENT-head normocephalic,  atraumatic, sclera clear, conjunctiva pink, hearing intact, trachea midline. Lungs- Clear to ausculation bilaterally, normal work of breathing Heart- Regular rate and rhythm, no murmurs, rubs or gallops  GI- soft, NT, ND, + BS Extremities- no clubbing, cyanosis, or edema MS- no significant deformity or atrophy Skin- no rash or lesion Psych- euthymic mood, full affect Neuro- strength and sensation are intact   Wt Readings from Last 3 Encounters:  11/03/21 62.5 kg  10/20/21 62.3 kg  10/11/21 61.4 kg    EKG today demonstrates  SR, NO STEMI Vent. rate 58 BPM PR interval 194 ms QRS duration 120 ms QT/QTcB 434/426 ms  Echo 02/04/21 demonstrated  1. Left ventricular ejection fraction, by estimation, is 60 to 65%. The  left ventricle has normal function. The left ventricle has no regional  wall motion abnormalities. There is mild asymmetric left ventricular  hypertrophy of the basal-septal segment. Left ventricular diastolic parameters are indeterminate.   2. Right ventricular systolic function is normal. The right ventricular  size is normal. There is normal pulmonary artery systolic pressure. The  estimated right ventricular systolic pressure is 76.7 mmHg.   3. Left atrial size was moderately dilated.   4. The mitral valve is degenerative. Trivial mitral valve regurgitation.  Moderate to severe mitral annular calcification.   5. The aortic valve is tricuspid. There is mild calcification of the  aortic valve. There is mild thickening of the aortic valve. Aortic valve  regurgitation is not visualized. Aortic valve sclerosis/calcification is  present, without any evidence of aortic stenosis.   6. The inferior vena cava is normal in size with greater than 50%  respiratory variability, suggesting right atrial pressure of 3 mmHg.   Comparison(s): No significant change from prior study.   Epic records are reviewed at length today  CHA2DS2-VASc Score = 4  [name] score is based  upon: CHF History: 0 HTN History: 1 Diabetes History: 0 Stroke History: 0 Vascular Disease History: 0 Age Score: 2 Gender Score: 1       ASSESSMENT AND PLAN: 1. Persistent Atrial Fibrillation/atrial flutter [name] CHA2DS2-VASc score is 4, indicating a 4.8% annual risk of stroke.   Diltiazem discontinued due to adverse reaction (itching) Pulmonology recommended trial off flecainide given rare cases of medication pneumonitis.  We discussed alternatives including Multaq, dofetilide, and ablation. She is  agreeable to discussing ablation in order to come off AAD with Dr Curt Bears, will request appointment.  She is not a good candidate for amiodarone with lung issues.  If she is not felt to be a good ablation candidate, could consider dofetilide once she has completed her course of Bactrim.  Will continue flecainide for now until she sees Dr Curt Bears.  Continue bisoprolol 5 mg daily Continue Eliquis 5 mg BID (weight > 60 kg, Cr is < 1.5)   2. Secondary Hypercoagulable State (ICD10:  D68.69) The patient is at significant risk for stroke/thromboembolism based upon her CHA2DS2-VASc Score of 4.  Continue Apixaban (Eliquis).   3. HTN Stable, no changes today.   Follow up with Dr Curt Bears to discuss ablation vs dofetilide.    [facility] [address] [place], [place] [address] [phone] 11/03/2021 1:35 PM

## 2021-11-05 ENCOUNTER — Other Ambulatory Visit: Payer: Self-pay | Admitting: Interventional Cardiology

## 2021-11-07 NOTE — Progress Notes (Deleted)
Name: Anna Mathews  MRN/ DOB: [id number], Jul 27, 1940    Age/ Sex: 81 y.o., female     PCP: Kathyrn Lass, MD   Reason for Endocrinology Evaluation: Osteoporosis     Initial Endocrinology Clinic Visit: 08/25/2018    PATIENT IDENTIFIER: Anna Mathews is a 81 y.o., female with a past medical history of Breast Ca ( S/P lumpectomy 01/27/2020) ,A.Fib, HTN , dyslipidemia ,and GERD . She has followed with [facility] since 8/3/2020for consultative assistance with management of her Osteoporosis.   HISTORICAL SUMMARY:   Pt was diagnosed with osteopenia:2013   Menarche at age : 49 Menopausal at age : 01/23/1988 Fracture Hx:  Finger fracture from a fall Hx of HRT: no FH of osteoporosis or hip fracture: no Prior Hx of anti-estrogenic therapy :Evista in 2016 Prior Hx of anti-resorptive therapy : Fosmax -worse GERD,Prolia started 01/2017, the second dose was 08/06/2018, 03/24/2019,10/01/2019  In our office she received  Prolia on 03/24/2019,  10/01/2019  Her insurance stopped covering [place] in 2022 and we opted to give her one dose of Reclast 05/11/2020  Reclast infusion 05/11/2020  Pt has been on anti-resorptive therapy due to a 10-yr FRAX of > 3.0 at the hip       SUBJECTIVE:    Today (11/07/2021):  Anna Mathews is here for a follow up on Osteopenia.  She is accompanied by her daughter.   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   Hyperlipidemia    Hypertension    Osteopenia    Postmenopausal  hormone replacement therapy 1989 - 07/2000   Past Surgical History:  Past Surgical History:  Procedure Laterality Date   BREAST DUCTAL SYSTEM EXCISION Left 06/2002   duct ectasia with fibrosis - atypical lobular hyperplasia with micro calcifications - tamoxifen X 28 months then change to Evista 10/06   BREAST LUMPECTOMY WITH RADIOACTIVE SEED LOCALIZATION Right 01/27/2020   Procedure: RIGHT BREAST LUMPECTOMY WITH RADIOACTIVE SEED LOCALIZATION;  Surgeon: Coralie Keens, MD;  Location: [facility];  Service: General;  Laterality: Right;   BREAST SURGERY Left 4/99   breast biopsy - fibrosis   BRONCHIAL BIOPSY  10/11/2021   Procedure: BRONCHIAL BIOPSIES;  Surgeon: Freddi Starr, MD;  Location: [facility];  Service: Pulmonary;;   BRONCHIAL WASHINGS  10/11/2021   Procedure: BRONCHIAL WASHINGS;  Surgeon: Freddi Starr, MD;  Location: [facility];  Service: Pulmonary;;   CARDIOVERSION N/A 07/16/2017   Procedure: CARDIOVERSION;  Surgeon: Jerline Pain, MD;  Location: [facility];  Service: Cardiovascular;  Laterality: N/A;   CARDIOVERSION N/A 10/20/2018   Procedure: CARDIOVERSION;  Surgeon: Buford Dresser, MD;  Location: [facility];  Service: Cardiovascular;  Laterality: N/A;   CATARACT EXTRACTION W/ INTRAOCULAR LENS IMPLANT Right    CESAREAN SECTION     X 2   HYSTEROSCOPY  4/95   w/D&C   TEE WITHOUT CARDIOVERSION N/A 10/20/2018   Procedure: TRANSESOPHAGEAL ECHOCARDIOGRAM (TEE);  Surgeon: Buford Dresser, MD;  Location:  [facility];  Service: Cardiovascular;  Laterality: N/A;   VIDEO BRONCHOSCOPY N/A 10/11/2021   Procedure: VIDEO BRONCHOSCOPY WITH FLUORO;  Surgeon: Freddi Starr, MD;  Location: [facility];  Service: Pulmonary;  Laterality: N/A;   Social History:  reports that she quit smoking about 53 years ago. Her smoking use included cigarettes. She started smoking about 64 years ago. She has a 3.00 pack-year smoking history. She has never used smokeless tobacco. She  reports that she does not currently use alcohol. She reports that she does not use drugs. Family History:  Family History  Problem Relation Age of Onset   Hypertension Mother    Heart disease Father    COPD Father    Diabetes Maternal Grandfather    Diabetes Son    Rheum arthritis Brother    Kidney cancer Brother    Breast cancer Maternal Grandmother    Diabetes Maternal Aunt    Diabetes Maternal Aunt      HOME MEDICATIONS: Allergies as of 11/08/2021       Reactions   Atorvastatin Other (See Comments)   Other reaction(s): messed with her liver   Crab [shellfish Allergy] Itching   Full body itching   Penicillins Other (See Comments)   REACTION: "ITCHING IN EYES" Has patient had a PCN reaction causing immediate rash, facial/tongue/throat swelling, SOB or lightheadedness with hypotension: Unknown Has patient had a PCN reaction causing severe rash involving mucus membranes or skin necrosis: Unknown Has patient had a PCN reaction that required hospitalization: No Has patient had a PCN reaction occurring within the last 10 years: No If all of the above answers are "NO", then may proceed with Cephalosporin use.   Diltiazem Other (See Comments)   Rash, burning and itching   Amlodipine Itching   Purpura rash        Medication List        Accurate as of November 07, 2021  1:28 PM. If you have any questions, ask your nurse or doctor.          albuterol 108 (90 Base) MCG/ACT inhaler Commonly known as: VENTOLIN HFA Inhale 2 puffs into the lungs every 6 (six) hours as needed for wheezing or shortness of breath.   apixaban 5 MG Tabs tablet Commonly known as: Eliquis Take 1 tablet (5 mg total) by mouth 2 (two) times daily.   bisoprolol 5 MG tablet Commonly known as: ZEBETA Take 1 tablet (5 mg total) by mouth daily.   busPIRone 15 MG tablet Commonly known as: BUSPAR Take 7.5 mg by mouth 2 (two) times daily.   CALCIUM 1200 PO Take 1,200 mg by mouth daily.   Fish Oil  1200 MG Caps Take 1,200 mg by mouth in the morning and at bedtime.   flecainide 150 MG tablet Commonly known as: TAMBOCOR TAKE 1 TABLET BY MOUTH TWICE A DAY   furosemide 20 MG tablet Commonly known as: LASIX TAKE 1 TABLET BY MOUTH EVERY DAY What changed:  when to take this reasons to take this   predniSONE 10 MG tablet Commonly known as: DELTASONE Take 2 tablets (20 mg total) by mouth daily with breakfast.   sulfamethoxazole-trimethoprim 800-160 MG tablet Commonly known as: BACTRIM DS Take 1 tablet by mouth 3 (three) times a week.          OBJECTIVE:   PHYSICAL EXAM: VS: LMP 01/23/1988 (Approximate)   EXAM: General: Pt appears well and is in NAD  Neck: General: Supple without adenopathy. Thyroid: Thyroid size normal.  No  goiter or nodules appreciated.  Lungs: Clear with good BS bilat with no rales, rhonchi, or wheezes  Heart: Auscultation: RRR.  Abdomen: Normoactive bowel sounds, soft, nontender, without masses or organomegaly palpable  Extremities:  BL LE: No pretibial edema normal ROM and strength.  Skin: Hair: Texture and amount normal with gender appropriate distribution Skin Inspection: No rashes Skin Palpation: Skin temperature, texture, and thickness normal to palpation  Mental Status: Judgment, insight: Intact Orientation: Oriented to time, place, and person Mood and affect: No depression, anxiety, or agitation     DATA REVIEWED:   Latest Reference Range & Units 10/11/21 10:00  Sodium 135 - 145 mmol/L 142  Potassium 3.5 - 5.1 mmol/L 4.3  Chloride 98 - 111 mmol/L 106  CO2 22 - 32 mmol/L 27  Glucose 70 - 99 mg/dL 117 (H)  BUN 8 - 23 mg/dL 27 (H)  Creatinine 0.44 - 1.00 mg/dL 0.86  Calcium 8.9 - 10.3 mg/dL 9.2  Anion gap 5 - 15  9  GFR, Estimated >60 mL/min >60  (H): Data is abnormally high DXA 05/03/2020     T - score  Change from 2018  AP - 0.20 Up 6% *  RFN -0.7    Right total hip  -0.8 Up 4.6% *  LFN 1.6    Left total hip -0.4 Up 11.8% *        ASSESSMENT / PLAN / RECOMMENDATIONS:   Osteopenia:   -The patient has completed a total of 3 years on Prolia(Started prolia in 01/2017, with the last dose 09/2019) , the reason for treatment is because she met criteria for antiresorptive therapy due to at 10-year FRAX of > 3% at the hips. -She is intolerant to oral bisphosphonates , was on Evista in 2016. - Received reclast 05/11/2020 -Her bone density in 04/2020 showed significant improvement in her BMD -We will repeat DXA in 2024    Medications  Continue calcium/vitamin D 600 mg twice daily     Follow-up in 1 year or sooner pending lab results  Signed electronically by: Mack Guise, MD  [facility]  [facility] [address] [place], [place] [address] Phone: [phone] FAX: [phone]      CC: Kathyrn Lass, [place] [address] Phone: [phone]  Fax: [phone]   Return to Endocrinology clinic as below: Future Appointments  Date Time Provider [place]  11/08/2021  1:00 PM Yazmine Sorey, Melanie Crazier, MD [facility] None  11/15/2021  2:15 PM Freddi Starr, MD [facility] None  11/29/2021 11:15 AM Constance Haw, MD [facility]  03/22/2022 11:30 AM Fenton, Doloris Hall, PA [facility] None

## 2021-11-08 ENCOUNTER — Ambulatory Visit: Payer: Medicare PPO | Admitting: Internal Medicine

## 2021-11-12 ENCOUNTER — Other Ambulatory Visit (HOSPITAL_COMMUNITY): Payer: Self-pay | Admitting: Physician Assistant

## 2021-11-13 LAB — FUNGUS CULTURE WITH STAIN

## 2021-11-13 LAB — FUNGAL ORGANISM REFLEX

## 2021-11-13 LAB — FUNGUS CULTURE RESULT

## 2021-11-14 ENCOUNTER — Ambulatory Visit: Payer: Medicare PPO | Admitting: Internal Medicine

## 2021-11-14 NOTE — Progress Notes (Deleted)
Name: Anna Mathews  MRN/ DOB: [id number], Jul 23, 1940    Age/ Sex: 81 y.o., female     PCP: Kathyrn Lass, MD   Reason for Endocrinology Evaluation: Osteoporosis     Initial Endocrinology Clinic Visit: 08/25/2018    PATIENT IDENTIFIER: [name] is a 81 y.o., female with a past medical history of Breast Ca ( S/P lumpectomy 01/27/2020) ,A.Fib, HTN , dyslipidemia ,and GERD . She has followed with [facility] since 8/3/2020for consultative assistance with management of her Osteoporosis.   HISTORICAL SUMMARY:   Pt was diagnosed with osteopenia:2013   Menarche at age : 20 Menopausal at age : 01/23/1988 Fracture Hx:  Finger fracture from a fall Hx of HRT: no FH of osteoporosis or hip fracture: no Prior Hx of anti-estrogenic therapy :Evista in 2016 Prior Hx of anti-resorptive therapy : Fosmax -worse GERD,Prolia started 01/2017, the second dose was 08/06/2018, 03/24/2019,10/01/2019  In our office she received  Prolia on 03/24/2019,  10/01/2019  Her insurance stopped covering [place] in 2022 and we opted to give her one dose of Reclast 05/11/2020  Reclast infusion 05/11/2020  Pt has been on anti-resorptive therapy due to a 10-yr FRAX of > 3.0 at the hip       SUBJECTIVE:    Today (11/14/2021):  Anna Mathews is here for a follow up on Osteopenia.  She is accompanied by her daughter.   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   Hyperlipidemia    Hypertension    Osteopenia    Postmenopausal  hormone replacement therapy 1989 - 07/2000   Past Surgical History:  Past Surgical History:  Procedure Laterality Date   BREAST DUCTAL SYSTEM EXCISION Left 06/2002   duct ectasia with fibrosis - atypical lobular hyperplasia with micro calcifications - tamoxifen X 28 months then change to Evista 10/06   BREAST LUMPECTOMY WITH RADIOACTIVE SEED LOCALIZATION Right 01/27/2020   Procedure: RIGHT BREAST LUMPECTOMY WITH RADIOACTIVE SEED LOCALIZATION;  Surgeon: Coralie Keens, MD;  Location: [facility];  Service: General;  Laterality: Right;   BREAST SURGERY Left 4/99   breast biopsy - fibrosis   BRONCHIAL BIOPSY  10/11/2021   Procedure: BRONCHIAL BIOPSIES;  Surgeon: Freddi Starr, MD;  Location: [facility];  Service: Pulmonary;;   BRONCHIAL WASHINGS  10/11/2021   Procedure: BRONCHIAL WASHINGS;  Surgeon: Freddi Starr, MD;  Location: [facility];  Service: Pulmonary;;   CARDIOVERSION N/A 07/16/2017   Procedure: CARDIOVERSION;  Surgeon: Jerline Pain, MD;  Location: [facility];  Service: Cardiovascular;  Laterality: N/A;   CARDIOVERSION N/A 10/20/2018   Procedure: CARDIOVERSION;  Surgeon: Buford Dresser, MD;  Location: [facility];  Service: Cardiovascular;  Laterality: N/A;   CATARACT EXTRACTION W/ INTRAOCULAR LENS IMPLANT Right    CESAREAN SECTION     X 2   HYSTEROSCOPY  4/95   w/D&C   TEE WITHOUT CARDIOVERSION N/A 10/20/2018   Procedure: TRANSESOPHAGEAL ECHOCARDIOGRAM (TEE);  Surgeon: Buford Dresser, MD;  Location:  [facility];  Service: Cardiovascular;  Laterality: N/A;   VIDEO BRONCHOSCOPY N/A 10/11/2021   Procedure: VIDEO BRONCHOSCOPY WITH FLUORO;  Surgeon: Freddi Starr, MD;  [facility]: [facility];  Service: Pulmonary;  Laterality: N/A;   Social History:  reports that she quit smoking about 53 years ago. Her smoking use included cigarettes. She started smoking about 64 years ago. She has a 3.00 pack-year smoking history. She has never used smokeless tobacco. She  reports that she does not currently use alcohol. She reports that she does not use drugs. Family History:  Family History  Problem Relation Age of Onset   Hypertension Mother    Heart disease Father    COPD Father    Diabetes Maternal Grandfather    Diabetes Son    Rheum arthritis Brother    Kidney cancer Brother    Breast cancer Maternal Grandmother    Diabetes Maternal Aunt    Diabetes Maternal Aunt      HOME MEDICATIONS: Allergies as of 11/14/2021       Reactions   Atorvastatin Other (See Comments)   Other reaction(s): messed with her liver   Crab [shellfish Allergy] Itching   Full body itching   Penicillins Other (See Comments)   REACTION: "ITCHING IN EYES" Has patient had a PCN reaction causing immediate rash, facial/tongue/throat swelling, SOB or lightheadedness with hypotension: Unknown Has patient had a PCN reaction causing severe rash involving mucus membranes or skin necrosis: Unknown Has patient had a PCN reaction that required hospitalization: No Has patient had a PCN reaction occurring within the last 10 years: No If all of the above answers are "NO", then may proceed with Cephalosporin use.   Diltiazem Other (See Comments)   Rash, burning and itching   Amlodipine Itching   Purpura rash        Medication List        Accurate as of November 14, 2021 12:15 PM. If you have any questions, ask your nurse or doctor.          albuterol 108 (90 Base) MCG/ACT inhaler Commonly known as: VENTOLIN HFA Inhale 2 puffs into the lungs every 6 (six) hours as needed for wheezing or shortness of breath.   apixaban 5 MG Tabs tablet Commonly known as: Eliquis Take 1 tablet (5 mg total) by mouth 2 (two) times daily.   bisoprolol 5 MG tablet Commonly known as: ZEBETA TAKE 1 TABLET (5 MG TOTAL) BY MOUTH DAILY.   busPIRone 15 MG tablet Commonly known as: BUSPAR Take 7.5 mg by mouth 2 (two) times daily.   CALCIUM 1200 PO Take 1,200 mg by mouth daily.   Fish Oil  1200 MG Caps Take 1,200 mg by mouth in the morning and at bedtime.   flecainide 150 MG tablet Commonly known as: TAMBOCOR TAKE 1 TABLET BY MOUTH TWICE A DAY   furosemide 20 MG tablet Commonly known as: LASIX TAKE 1 TABLET BY MOUTH EVERY DAY What changed:  when to take this reasons to take this   predniSONE 10 MG tablet Commonly known as: DELTASONE Take 2 tablets (20 mg total) by mouth daily with breakfast.   sulfamethoxazole-trimethoprim 800-160 MG tablet Commonly known as: BACTRIM DS Take 1 tablet by mouth 3 (three) times a week.          OBJECTIVE:   PHYSICAL EXAM: VS: LMP 01/23/1988 (Approximate)   EXAM: General: Pt appears well and is in NAD  Neck: General: Supple without adenopathy. Thyroid: Thyroid size normal.  No goiter  or nodules appreciated.  Lungs: Clear with good BS bilat with no rales, rhonchi, or wheezes  Heart: Auscultation: RRR.  Abdomen: Normoactive bowel sounds, soft, nontender, without masses or organomegaly palpable  Extremities:  BL LE: No pretibial edema normal ROM and strength.  Skin: Hair: Texture and amount normal with gender appropriate distribution Skin Inspection: No rashes Skin Palpation: Skin temperature, texture, and thickness normal to palpation  Mental Status: Judgment, insight: Intact Orientation: Oriented to time, place, and person Mood and affect: No depression, anxiety, or agitation     DATA REVIEWED:   Latest Reference Range & Units 10/11/21 10:00  Sodium 135 - 145 mmol/L 142  Potassium 3.5 - 5.1 mmol/L 4.3  Chloride 98 - 111 mmol/L 106  CO2 22 - 32 mmol/L 27  Glucose 70 - 99 mg/dL 117 (H)  BUN 8 - 23 mg/dL 27 (H)  Creatinine 0.44 - 1.00 mg/dL 0.86  Calcium 8.9 - 10.3 mg/dL 9.2  Anion gap 5 - 15  9  GFR, Estimated >60 mL/min >60  (H): Data is abnormally high DXA 05/03/2020     T - score  Change from 2018  AP - 0.20 Up 6% *  RFN -0.7    Right total hip  -0.8 Up 4.6% *  LFN 1.6    Left total hip -0.4 Up 11.8% *        ASSESSMENT / PLAN / RECOMMENDATIONS:   Osteopenia:   -The patient has completed a total of 3 years on Prolia(Started prolia in 01/2017, with the last dose 09/2019) , the reason for treatment is because she met criteria for antiresorptive therapy due to at 10-year FRAX of > 3% at the hips. -She is intolerant to oral bisphosphonates , was on Evista in 2016. - Received reclast 05/11/2020 -Her bone density in 04/2020 showed significant improvement in her BMD -We will repeat DXA in 2024    Medications  Continue calcium/vitamin D 600 mg twice daily     Follow-up in 1 year or sooner pending lab results  Signed electronically by: Mack Guise, MD  [facility]  [facility] [address] [place],  [address] Phone: [phone] FAX: [phone]      CC: Kathyrn Lass, [place] [address] Phone: [phone]  Fax: [phone]   Return to Endocrinology clinic as below: Future Appointments  Date Time Provider [place]  11/14/2021  1:00 PM Romari Gasparro, Melanie Crazier, MD [facility] None  11/15/2021  2:15 PM Freddi Starr, MD [facility] None  11/29/2021 11:15 AM Constance Haw, MD [facility]  03/22/2022 11:30 AM Fenton, Doloris Hall, PA [facility] None

## 2021-11-15 ENCOUNTER — Ambulatory Visit: Payer: Medicare PPO | Admitting: Pulmonary Disease

## 2021-11-25 LAB — ACID FAST CULTURE WITH REFLEXED SENSITIVITIES (MYCOBACTERIA): Acid Fast Culture: NEGATIVE

## 2021-11-29 ENCOUNTER — Telehealth: Payer: Self-pay | Admitting: Cardiology

## 2021-11-29 ENCOUNTER — Ambulatory Visit: Payer: Medicare PPO | Attending: Cardiology | Admitting: Cardiology

## 2021-11-29 ENCOUNTER — Encounter: Payer: Self-pay | Admitting: Cardiology

## 2021-11-29 VITALS — BP 172/58 | HR 57 | Ht 63.0 in | Wt 142.8 lb

## 2021-11-29 DIAGNOSIS — I480 Paroxysmal atrial fibrillation: Secondary | ICD-10-CM | POA: Diagnosis not present

## 2021-11-29 DIAGNOSIS — I10 Essential (primary) hypertension: Secondary | ICD-10-CM

## 2021-11-29 DIAGNOSIS — D6869 Other thrombophilia: Secondary | ICD-10-CM

## 2021-11-29 DIAGNOSIS — I4819 Other persistent atrial fibrillation: Secondary | ICD-10-CM

## 2021-11-29 NOTE — Telephone Encounter (Signed)
Left message to call back  

## 2021-11-29 NOTE — Patient Instructions (Signed)
Medication Instructions:  Your physician recommends that you continue on your current medications as directed. Please refer to the Current Medication list given to you today.  *If you need a refill on your cardiac medications before your next appointment, please call your pharmacy*   Lab Work: None ordered   Testing/Procedures: None ordered   Follow-Up: At [facility], you and your health needs are our priority.  As part of our continuing mission to provide you with exceptional heart care, we have created designated Provider Care Teams.  These Care Teams include your primary Cardiologist (physician) and Advanced Practice Providers (APPs -  Physician Assistants and Nurse Practitioners) who all work together to provide you with the care you need, when you need it.  Your next appointment:   To be  determined  The format for your next appointment:   In Person  Provider:   Allegra Lai, MD    Thank you for choosing [facility]!!   Trinidad Curet, RN [phone]  Other Instructions  Please call and check on the cost of Tikosyn -- please let the office know if this will be affordable.  Dofetilide (Tikosyn) Admission  Prior to day of admission: Check with drug insurance company for cost of drug to ensure affordability --- Dofetilide 500 mcg twice a day.  GoodRx is an option if insurance copay is unaffordable.  A pharmacist will review all your medications for potential interactions with Tikosyn. If any medication changes are needed prior to admission we will be in touch with you.  If any new medications are started AFTER your admission date is set with [place] [phone]). Please notify our office immediately so your medication list can be updated and reviewed by our pharmacist again.  No Benadryl is allowed 3 days prior to admission.  Please ensure no missed doses of your anticoagulation (blood thinner) for 3 weeks prior to admission. If a dose is missed please  notify our office immediately.  Tikosyn initiation requires a 3 night/4 day hospital stay with constant telemetry monitoring. You will have an EKG after each dose of Tikosyn as well as daily lab draws. On day of admission: [facility] office visit on the morning of admission is needed for preliminary labs/ekg.  You may bring personal belongings/clothing with you to the hospital. Please leave your suitcase in the car until you arrive in admissions.  Time of admission is dependent on bed availability in the hospital. In some instances, you will be sent home until bed is available. Rarely admission can be delayed to the following day if hospital census prevents available beds.  If the drug does not convert you to normal rhythm a cardioversion after the 4th dose of Tikosyn.  Questions please call our office at [place]

## 2021-11-29 NOTE — Telephone Encounter (Signed)
Pt c/o medication issue:  1. Name of Medication: tikosyn  2. How are you currently taking this medication (dosage and times per day)?   3. Are you having a reaction (difficulty breathing--STAT)? no  4. What is your medication issue? Patient calling to speak with Sherri about having the observation for the tikosyn

## 2021-11-29 NOTE — Progress Notes (Signed)
Electrophysiology Office Note   Date:  11/29/2021   ID:  Anna Mathews, DOB Sep 03, 1940, MRN [id number]  PCP:  Kathyrn Lass, MD  Cardiologist:  Casandra Doffing Primary Electrophysiologist:  Melizza Kanode Meredith Leeds, MD    No chief complaint on file.     History of Present Illness: Anna Mathews is a 81 y.o. female who is being seen today for the evaluation of atrial fibrillation at the request of Ermalinda Barrios. Presenting today for electrophysiology evaluation.    She has a history of similar atrial fibrillation on flecainide, hypertension, hyperlipidemia.  She had a colonoscopy and was noted to be in atrial fibrillation.  She had symptoms of shortness of breath and fatigue.  Today, denies symptoms of palpitations, chest pain, shortness of breath, orthopnea, PND, lower extremity edema, claudication, dizziness, presyncope, syncope, bleeding, or neurologic sequela. The patient is tolerating medications without difficulties.  She met all by her pulmonologist that her flecainide could be affecting her lungs.  She would like get off the medication.  She is noted no episodes of atrial fibrillation since being on the higher dose of flecainide.   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   Hyperlipidemia    Hypertension    Osteopenia    Postmenopausal hormone replacement therapy 1989 - 07/2000   Past Surgical History:  Procedure Laterality Date   BREAST DUCTAL SYSTEM EXCISION Left 06/2002   duct ectasia with fibrosis - atypical lobular hyperplasia with micro calcifications - tamoxifen X 28 months then change to Evista 10/06   BREAST LUMPECTOMY WITH RADIOACTIVE SEED LOCALIZATION Right 01/27/2020   Procedure: RIGHT BREAST LUMPECTOMY WITH RADIOACTIVE SEED LOCALIZATION;  Surgeon: Coralie Keens, MD;  Location: [place];  Service: General;  Laterality: Right;   BREAST SURGERY Left 4/99   breast biopsy - fibrosis   BRONCHIAL BIOPSY  10/11/2021   Procedure: BRONCHIAL BIOPSIES;  Surgeon: Freddi Starr, MD;  Location: [place];  Service: Pulmonary;;   BRONCHIAL WASHINGS  10/11/2021   Procedure: BRONCHIAL WASHINGS;  Surgeon: Freddi Starr, MD;  Location: [place];  Service: Pulmonary;;   CARDIOVERSION N/A 07/16/2017   Procedure: CARDIOVERSION;  Surgeon: Jerline Pain, MD;  Location: [facility];  Service: Cardiovascular;  Laterality: N/A;   CARDIOVERSION N/A 10/20/2018   Procedure: CARDIOVERSION;  Surgeon: Buford Dresser, MD;  Location: [place];  Service: Cardiovascular;  Laterality: N/A;   CATARACT EXTRACTION W/ INTRAOCULAR LENS IMPLANT Right    CESAREAN SECTION     X 2   HYSTEROSCOPY  4/95   w/D&C   TEE WITHOUT CARDIOVERSION N/A 10/20/2018   Procedure: TRANSESOPHAGEAL ECHOCARDIOGRAM (TEE);  Surgeon: Buford Dresser, MD;  [facility];  Service: Cardiovascular;  Laterality: N/A;   VIDEO BRONCHOSCOPY N/A 10/11/2021   Procedure: VIDEO BRONCHOSCOPY WITH FLUORO;  Surgeon: Freddi Starr, MD;  Location[facility] END[facility]COPY;  Service: Pulmonary;  Laterality: N/A;     Current Outpatient Medications  Medication Sig Dispense Refill   albuterol (VENTOLIN HFA) 108 (90 Base) MCG/ACT inhaler Inhale 2 puffs into the lungs every 6 (six) hours as needed for wheezing or shortness of breath. 8 g 6   apixaban (ELIQUIS) 5 MG TABS tablet Take 1 tablet (5 mg total) by mouth 2 (two) times daily. 60 tablet  3   bisoprolol (ZEBETA) 5 MG tablet TAKE 1 TABLET (5 MG TOTAL) BY MOUTH DAILY. 90 tablet 1   busPIRone (BUSPAR) 15 MG tablet Take 7.5 mg by mouth 2 (two) times daily.     Calcium Carbonate-Vit D-Min (CALCIUM 1200 PO) Take 1,200 mg by mouth daily.     flecainide (TAMBOCOR) 150 MG tablet TAKE 1 TABLET BY MOUTH TWICE A DAY 180 tablet 1   furosemide (LASIX) 20  MG tablet TAKE 1 TABLET BY MOUTH EVERY DAY (Patient taking differently: Take 20 mg by mouth daily as needed for fluid.) 90 tablet 3   Omega-3 Fatty Acids (FISH OIL) 1200 MG CAPS Take 1,200 mg by mouth in the morning and at bedtime.     predniSONE (DELTASONE) 10 MG tablet Take 2 tablets (20 mg total) by mouth daily with breakfast. 64 tablet 1   sulfamethoxazole-trimethoprim (BACTRIM DS) 800-160 MG tablet Take 1 tablet by mouth 3 (three) times a week. 12 tablet 1   No current facility-administered medications for this visit.    Allergies:   Atorvastatin, Crab [shellfish allergy], Penicillins, Diltiazem, and Amlodipine   Social History:  The patient  reports that she quit smoking about 53 years ago. Her smoking use included cigarettes. She started smoking about 64 years ago. She has a 3.00 pack-year smoking history. She has never used smokeless tobacco. She reports that she does not currently use alcohol. She reports that she does not use drugs.   Family History:  [name] family history includes Breast cancer in her maternal grandmother; COPD in her father; Diabetes in her maternal aunt, maternal aunt, maternal grandfather, and son; Heart disease in her father; Hypertension in her mother; Kidney cancer in her brother; Rheum arthritis in her brother.   ROS:  Please see the history of present illness.   Otherwise, review of systems is positive for none.   All other systems are reviewed and negative.   PHYSICAL EXAM: VS:  BP (!) 176/82   Pulse (!) 57   Ht _0  (1.6 m)   Wt 142 lb 12.8 oz (64.8 kg)   LMP 01/23/1988 (Approximate)   SpO2 95%   BMI 25.30 kg/m  , BMI Body mass index is 25.3 kg/m. GEN: Well nourished, well developed, in no acute distress  HEENT: normal  Neck: no JVD, carotid bruits, or masses Cardiac: RRR; no murmurs, rubs, or gallops,no edema  Respiratory:  clear to auscultation bilaterally, normal work of breathing GI: soft, nontender, nondistended, + BS MS: no deformity  or atrophy  Skin: warm and dry Neuro:  Strength and sensation are intact Psych: euthymic mood, full affect  EKG:  EKG is ordered today. Personal review of the ekg ordered shows sinus rhythm   Recent Labs: 03/23/2021: NT-Pro BNP 1,089 07/05/2021: ALT 20 09/27/2021: Hemoglobin 12.7; Platelets 397.0 10/11/2021: BUN 27; Creatinine, Ser 0.86; Potassium 4.3; Sodium 142    Lipid Panel  No results found for: "CHOL", "TRIG", "HDL", "CHOLHDL", "VLDL", "LDLCALC", "LDLDIRECT"   Wt Readings from Last 3 Encounters:  11/29/21 142 lb 12.8 oz (64.8 kg)  11/03/21 137 lb 12.8 oz (62.5 kg)  10/20/21 137 lb 6.4 oz (62.3 kg)      Other studies Reviewed: Additional studies/ records that were reviewed today include: TTE 02/04/21 Review of the above records today demonstrates:   1. Left ventricular ejection fraction, by estimation, is 60 to 65%. The  left ventricle has normal function. The left ventricle has no regional  wall motion abnormalities. There is mild  asymmetric left ventricular  hypertrophy of the basal-septal segment.  Left ventricular diastolic parameters are indeterminate.   2. Right ventricular systolic function is normal. The right ventricular  size is normal. There is normal pulmonary artery systolic pressure. The  estimated right ventricular systolic pressure is 44.4 mmHg.   3. Left atrial size was moderately dilated.   4. The mitral valve is degenerative. Trivial mitral valve regurgitation.  Moderate to severe mitral annular calcification.   5. The aortic valve is tricuspid. There is mild calcification of the  aortic valve. There is mild thickening of the aortic valve. Aortic valve  regurgitation is not visualized. Aortic valve sclerosis/calcification is  present, without any evidence of  aortic stenosis.   6. The inferior vena cava is normal in size with greater than 50%  respiratory variability, suggesting right atrial pressure of 3 mmHg.     ASSESSMENT AND PLAN:  1.   Paroxysmal atrial fibrillation/flutter: Currently on Eliquis 5 mg twice daily, flecainide 150 mg twice daily.  CHA2DS2-VASc of 4.  Would prefer an alternative rhythm control strategy as she feels that the flecainide is affecting her lungs and making her more short of breath.  We discussed ablation versus medication management.  Due to the wait for ablation, she would prefer to start dofetilide.  2.  Hypertension: Elevated today.  She Noela Brothers check it at home and if it is averaging greater than 130/80 she Welda Azzarello call us and we Carynn Felling adjust medications.  3.  Hyperlipidemia: Continue Zocor per primary physician  4.  Secondary hypercoagulable state: Currently on Eliquis for atrial fibrillation as above.  Current medicines are reviewed at length with the patient today.   The patient does not have concerns regarding her medicines.  The following changes were made today: none  Labs/ tests ordered today include:  Orders Placed This Encounter  Procedures   EKG 12-Lead    Disposition:   FU with Danetra Glock 6 months  Signed, Savalas Monje Meredith Leeds, MD  11/29/2021 12:00 PM     [address] [place] [address] [phone] (office) [phone] (fax)

## 2021-11-30 NOTE — Telephone Encounter (Signed)
Left message to call back  

## 2021-12-04 ENCOUNTER — Telehealth: Payer: Self-pay | Admitting: Cardiology

## 2021-12-04 NOTE — Telephone Encounter (Signed)
Pt is not very excited about another medication d/t her sensitivity/issue with a lot of medications.   Ablation date held for 04/27/2022

## 2021-12-04 NOTE — Telephone Encounter (Signed)
See 11/8 telephone note

## 2021-12-04 NOTE — Telephone Encounter (Signed)
Durel Salts at 12/04/2021 10:36 AM  Status: Signed  Patient is returning phone call...please call back

## 2021-12-04 NOTE — Telephone Encounter (Signed)
Patient is returning phone call...please call back

## 2021-12-07 ENCOUNTER — Ambulatory Visit: Payer: Medicare PPO | Admitting: Pulmonary Disease

## 2021-12-07 ENCOUNTER — Encounter: Payer: Self-pay | Admitting: Pulmonary Disease

## 2021-12-07 ENCOUNTER — Ambulatory Visit (INDEPENDENT_AMBULATORY_CARE_PROVIDER_SITE_OTHER): Payer: Medicare PPO

## 2021-12-07 VITALS — BP 130/76 | HR 58 | Ht 63.0 in | Wt 142.0 lb

## 2021-12-07 DIAGNOSIS — J8489 Other specified interstitial pulmonary diseases: Secondary | ICD-10-CM

## 2021-12-07 DIAGNOSIS — R768 Other specified abnormal immunological findings in serum: Secondary | ICD-10-CM

## 2021-12-07 LAB — COMPREHENSIVE METABOLIC PANEL
ALT: 19 U/L (ref 0–35)
AST: 13 U/L (ref 0–37)
Albumin: 4 g/dL (ref 3.5–5.2)
Alkaline Phosphatase: 48 U/L (ref 39–117)
BUN: 32 mg/dL — ABNORMAL HIGH (ref 6–23)
CO2: 33 mEq/L — ABNORMAL HIGH (ref 19–32)
Calcium: 9.5 mg/dL (ref 8.4–10.5)
Chloride: 103 mEq/L (ref 96–112)
Creatinine, Ser: 1.04 mg/dL (ref 0.40–1.20)
GFR: 49.92 mL/min — ABNORMAL LOW (ref >60.00)
Glucose, Bld: 120 mg/dL — ABNORMAL HIGH (ref 70–99)
Potassium: 4 mEq/L (ref 3.5–5.1)
Sodium: 141 mEq/L (ref 135–145)
Total Bilirubin: 0.5 mg/dL (ref 0.2–1.2)
Total Protein: 6.4 g/dL (ref 6.0–8.3)

## 2021-12-07 LAB — CBC WITH DIFFERENTIAL/PLATELET
Basophils Absolute: 0 10*3/uL (ref 0.0–0.1)
Basophils Relative: 0.1 % (ref 0.0–3.0)
Eosinophils Absolute: 0 10*3/uL (ref 0.0–0.7)
Eosinophils Relative: 0.4 % (ref 0.0–5.0)
HCT: 41.8 % (ref 36.0–46.0)
Hemoglobin: 13.6 g/dL (ref 12.0–15.0)
Lymphocytes Relative: 12.7 % (ref 12.0–46.0)
Lymphs Abs: 1.1 10*3/uL (ref 0.7–4.0)
MCHC: 32.5 g/dL (ref 30.0–36.0)
MCV: 90.2 fl (ref 78.0–100.0)
Monocytes Absolute: 0.8 10*3/uL (ref 0.1–1.0)
Monocytes Relative: 8.9 % (ref 3.0–12.0)
Neutro Abs: 6.7 10*3/uL (ref 1.4–7.7)
Neutrophils Relative %: 77.9 % — ABNORMAL HIGH (ref 43.0–77.0)
Platelets: 260 10*3/uL (ref 150.0–400.0)
RBC: 4.64 Mil/uL (ref 3.87–5.11)
RDW: 15.8 % — ABNORMAL HIGH (ref 11.5–15.5)
WBC: 8.6 10*3/uL (ref 4.0–10.5)

## 2021-12-07 NOTE — Progress Notes (Signed)
Synopsis: Referred in April 2023 for cough by Yaakov Guthrie, MD  Subjective:   PATIENT ID: Anna Mathews GENDER: female DOB: October 25, 1940, MRN: [id number]  HPI  Chief Complaint  Patient presents with   Follow-up    States her breathing has been stable since last visit. Still taking Bactrim and prednisone.    Anna Mathews is an 81 year old woman, former smoker with atrial fibrillation, GERD, hiatal hernia, and hypertension who returns to pulmonary clinic for organizing pneumonia.   She was seen by Anna Barrow, Anna Mathews 09/27/21 for progressive dyspnea with HRCT chest 10/02/21 showing upper lobe prodominate irregular GGO and consolidative airspace opacities throught the lungs suggestive of developing fibrotic changes. She was taken for bronchoscopy with BAL and LUL transbronchial biopsies which showed organizing pneumonia. She was placed on 10m of prednisone daily and bactrim DS prophylaxis.   She is feeling somewhat better. She reports her skin has a burning sensation. Denies any rash.  OV 08/24/21 She has completed steroid taper at end of July. She feels her breathing is back to baseline. She has no active complaints at this time.  OV 07/12/21 She continues to feel much better. She is accompanied by her friend today.   She is currently on 364mof prednisone daily and bactrim DS 1 tab 3 days per week.   Chest radiograph at last visit showed improved infiltrates.  OV 06/07/21 She was started on high dose prednisone taper on 05/12/21 empirically for concern of organizing pneumonia vs pneumonitis related to hydralazine exposure.   She has been on 406mrednisone and is feeling much better. Her fatigue has resolved and she denies cough. She has mild dyspnea but overal much improved. She has started taking bactrim DS 3 times per week for pneumocystis prophylaxis.   OV 05/08/21 She reports having shortness of breath since the beginning of the year which progressed in mid to late February. She was seeing  her cardiology team and her blood pressure medications were being titrated at that time. There was concern she had drug induced rash secondary to amlodipine but it seems that the rash persisted despite switching off this medication. 1/13 for hypertensive urgency but no chest imaging performed at that time. She was given 43m32m IV hydralazine in the ED on 02/03/21.   She has been adjusting many different antihypertensive agents with the cardiology team. She was started on hydralazine 10mg56m on 2/7 then called the cardiology office 2/10 with reports of redness on her arms/legs with a burning sensation. The dose was reduced then ultimately stopped on 2/13.   She continued to have progressive dyspnea in early March. She was started on lasix at that time with no improvement. A chest x-ray 03/28/21 was obtained which showed which showed right lower lobe and left perihilar opacities concerning for pneumonia. She was treated with doxycyline without much improvement. She continued to feel weak with low energy. She was given levaquin by her primary care on 3/23 which did not help improve her symptoms. She had x-ray 3/28 which showed progressive multifocal infiltrates. She was then treated with doxycycline again with no improvement. X ray 4/4 showed improvement of opacities on right but persistent left sided opacities.   CT Chest 4/11 showed multiple ground glass opacities in both upper lobes and right lower lobe.   She continues to have dyspnea, dry cough and fatigue. She has intermittent nausea. She has lost 12-15lbs since last fall. She denies any joint pains and no other skin rashes.  She quit smoking in 1970. Denies any harmful occupational exposures. Her father had emphysema.   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   Hyperlipidemia    Hypertension    Osteopenia    Postmenopausal hormone replacement therapy 1989 - 07/2000     Family History  Problem Relation Age of Onset   Hypertension Mother    Heart disease Father    COPD Father    Diabetes Maternal Grandfather    Diabetes Son    Rheum arthritis Brother    Kidney cancer Brother    Breast cancer Maternal Grandmother    Diabetes Maternal Aunt    Diabetes Maternal Aunt      Social History   Socioeconomic History   Marital status: Widowed    Spouse name: Not on file   Number of children: 2   Years of education: Not on file   Highest education level: Not on file  Occupational History   Not on file  Tobacco Use   Smoking status: Former    Packs/day: 0.25    Years: 12.00    Total pack years: 3.00    Types: Cigarettes    Start date: 01/16/1957    Quit date: 01/23/1968    Years since quitting: 53.9   Smokeless tobacco: Never   Tobacco comments:    Former smoker 08/04/21  Vaping Use   Vaping Use: Never used  Substance and Sexual Activity   Alcohol use: Not Currently    Comment: social   Drug use: No   Sexual activity: Not Currently    Partners: Male    Birth control/protection: Post-menopausal  Other Topics Concern   Not on file  Social History Narrative   Not on file   Social Determinants of Health   Financial Resource Strain: Not on file  Food Insecurity: Not on file  Transportation Needs: Not on file  Physical Activity: Not on file  Stress: Not on file  Social Connections: Not on file  Intimate Partner Violence: Not on file     Allergies  Allergen Reactions   Atorvastatin Other (See Comments)    Other reaction(s): messed with her liver   Crab [Shellfish Allergy] Itching    Full body itching   Penicillins Other (See Comments)    REACTION: "ITCHING IN EYES" Has patient had a PCN reaction causing immediate rash, facial/tongue/throat swelling, SOB or lightheadedness with  hypotension: Unknown Has patient had a PCN reaction causing severe rash involving mucus membranes or skin necrosis: Unknown Has patient had a PCN reaction that required hospitalization: No Has patient had a PCN reaction occurring within the last 10 years: No If all of the above answers are "NO", then may proceed with Cephalosporin use.    Diltiazem Other (See Comments)    Rash, burning and itching   Amlodipine Itching    Purpura rash     Outpatient Medications Prior to Visit  Medication Sig Dispense Refill   albuterol (VENTOLIN HFA) 108 (90 Base) MCG/ACT inhaler Inhale 2 puffs into the lungs every 6 (six) hours as needed for wheezing or shortness of breath. 8 g 6   bisoprolol (ZEBETA) 5 MG tablet TAKE 1 TABLET (5 MG TOTAL) BY MOUTH DAILY. 90 tablet 1   busPIRone (BUSPAR) 15  MG tablet Take 7.5 mg by mouth 2 (two) times daily.     Calcium Carbonate-Vit D-Min (CALCIUM 1200 PO) Take 1,200 mg by mouth daily.     flecainide (TAMBOCOR) 150 MG tablet TAKE 1 TABLET BY MOUTH TWICE A DAY 180 tablet 1   furosemide (LASIX) 20 MG tablet TAKE 1 TABLET BY MOUTH EVERY DAY (Patient taking differently: Take 20 mg by mouth daily as needed for fluid.) 90 tablet 3   Omega-3 Fatty Acids (FISH OIL) 1200 MG CAPS Take 1,200 mg by mouth in the morning and at bedtime.     predniSONE (DELTASONE) 10 MG tablet Take 2 tablets (20 mg total) by mouth daily with breakfast. 64 tablet 1   sulfamethoxazole-trimethoprim (BACTRIM DS) 800-160 MG tablet Take 1 tablet by mouth 3 (three) times a week. 12 tablet 1   apixaban (ELIQUIS) 5 MG TABS tablet Take 1 tablet (5 mg total) by mouth 2 (two) times daily. 60 tablet 3   No facility-administered medications prior to visit.   Review of Systems  Constitutional:  Negative for chills, fever, malaise/fatigue and weight loss.  HENT:  Negative for congestion, sinus pain and sore throat.   Eyes: Negative.   Respiratory:  Negative for cough, hemoptysis, sputum production, shortness of  breath and wheezing.   Cardiovascular:  Negative for chest pain, palpitations, orthopnea, claudication and leg swelling.  Gastrointestinal:  Negative for abdominal pain, heartburn, nausea and vomiting.  Genitourinary: Negative.   Musculoskeletal:  Negative for joint pain and myalgias.  Skin:  Negative for rash.  Neurological:  Positive for sensory change. Negative for weakness.  Endo/Heme/Allergies: Negative.   Psychiatric/Behavioral: Negative.      Objective:   Vitals:   12/07/21 1305  BP: 130/76  Pulse: (!) 58  SpO2: 96%  Weight: 142 lb (64.4 kg)  Height: 5' 3" (1.6 m)   Physical Exam Constitutional:      General: She is not in acute distress.    Appearance: She is not ill-appearing.  HENT:     Head: Normocephalic and atraumatic.  Cardiovascular:     Rate and Rhythm: Normal rate and regular rhythm.     Pulses: Normal pulses.     Heart sounds: Normal heart sounds. No murmur heard. Pulmonary:     Effort: Pulmonary effort is normal.     Breath sounds: No wheezing, rhonchi or rales.  Musculoskeletal:     Right lower leg: No edema.     Left lower leg: No edema.  Skin:    General: Skin is warm and dry.  Neurological:     General: No focal deficit present.     Mental Status: She is alert.  Psychiatric:        Mood and Affect: Mood normal.        Behavior: Behavior normal.        Thought Content: Thought content normal.        Judgment: Judgment normal.    CBC    Component Value Date/Time   WBC 8.6 12/07/2021 1357   RBC 4.64 12/07/2021 1357   HGB 13.6 12/07/2021 1357   HGB 12.7 06/07/2020 1549   HCT 41.8 12/07/2021 1357   HCT 37.1 06/07/2020 1549   PLT 260.0 12/07/2021 1357   PLT 222 06/07/2020 1549   MCV 90.2 12/07/2021 1357   MCV 91 06/07/2020 1549   MCH 30.3 02/03/2021 1045   MCHC 32.5 12/07/2021 1357   RDW 15.8 (H) 12/07/2021 1357   RDW 12.2 06/07/2020 1549   LYMPHSABS  1.1 12/07/2021 1357   MONOABS 0.8 12/07/2021 1357   EOSABS 0.0 12/07/2021 1357    BASOSABS 0.0 12/07/2021 1357      Latest Ref Rng & Units 12/07/2021    1:57 PM 10/11/2021   10:00 AM 07/26/2021   10:11 AM  BMP  Glucose 70 - 99 mg/dL 120  117  105   BUN 6 - 23 mg/dL 32  27  41   Creatinine 0.40 - 1.20 mg/dL 1.04  0.86  1.00   Sodium 135 - 145 mEq/L 141  142  142   Potassium 3.5 - 5.1 mEq/L 4.0  4.3  4.0   Chloride 96 - 112 mEq/L 103  106  107   CO2 19 - 32 mEq/L 33  27    Calcium 8.4 - 10.5 mg/dL 9.5  9.2     Chest imaging: CXR 12/07/21 Near complete resolution of bilateral pulmonary opacities with possible mild residual postinflammatory scarring on the left. No acute findings.  HRCT Chest 10/02/21 1. Fluctuant bilateral, upper lobe predominant irregular, somewhat geographic ground-glass and consolidative airspace opacities throughout the lungs, on today's exam with a more dense, consolidative appearance when compared to prior examination dated 05/02/2021, with some suggestion of developing fibrotic change. Unusual findings are generally nonspecific and infectious or inflammatory, and particularly would be in keeping with reported history of drug reaction, general differential considerations with this appearance including eosinophilic pneumonia, organizing pneumonia, and atypical infection. 2. Lobular air trapping on expiratory phase imaging consistent with small airways disease. 3. Coronary artery disease.  CXR 06/07/21 Resolution of prior mixed interstitial and airspace disease, compatible with resolved pneumonia, with background of chronic changes.  CT Chest 05/02/21 Mediastinum/Nodes: 2.4 cm left thyroid nodule is noted. Esophagus is unremarkable. Mildly enlarged precarinal adenopathy is noted which most likely is reactive or inflammatory in etiology.   Lungs/Pleura: No pneumothorax or pleural effusion is noted. Multiple ground-glass opacities are noted in both upper lobes and to some degree the right lower lobe, which may represent  multifocal inflammation or sequela of prior infection. Bilateral lower lobe opacities are noted, right greater than left, concerning for pneumonia.  PFT:     No data to display          Labs:  Path:  Echo 02/04/21: LV EF 60-65%. RV size and systolic function is normal. LA size is moderately dilated.   Heart Catheterization:  Assessment & Plan:   Organizing pneumonia ([place]) - Plan: Comp Met (CMET), CBC with Differential, ANA, DG Chest 2 View, ANA, CBC with Differential, Comp Met (CMET)  Discussion: Namya Voges is an 81 year old woman, former smoker with atrial fibrillation, GERD, hiatal hernia, and hypertension who returns to pulmonary clinic for pneumonitis.   She was treated with high dose prednisone taper for suspected drug induced pneumonitis vs organizing pneumonia related to hydralazine exposure.  She started prednisone 50m daily on 05/12/21 and completed slow taper on 08/19/21. Her respiratory status had returned to baseline but then dyspnea and cough returned and HRCT chest 10/02/21 showed fluctuant upper lobe interstitial opacities. She was taken for bronchoscopy on 10/11/21 with transbronchial biopsies confirming organizing pneumonia. She was placed on 259mdaily with bactrim DS 3 days per week. We will taper her to 1590maily.   Chest radiograph today showed improvement in interstitial opacities. Labs show 1:80 previously. We will refer her to rheumatology for further evaluation.   Recommend sleep apnea testing given her hypertension and atrial fibrillation.  Follow up in 2 months.  Freda Jackson, MD [facility] Office: [phone]   Current Outpatient Medications:    albuterol (VENTOLIN HFA) 108 (90 Base) MCG/ACT inhaler, Inhale 2 puffs into the lungs every 6 (six) hours as needed for wheezing or shortness of breath., Disp: 8 g, Rfl: 6   bisoprolol (ZEBETA) 5 MG tablet, TAKE 1 TABLET (5 MG TOTAL) BY MOUTH DAILY., Disp: 90 tablet, Rfl: 1    busPIRone (BUSPAR) 15 MG tablet, Take 7.5 mg by mouth 2 (two) times daily., Disp: , Rfl:    Calcium Carbonate-Vit D-Min (CALCIUM 1200 PO), Take 1,200 mg by mouth daily., Disp: , Rfl:    flecainide (TAMBOCOR) 150 MG tablet, TAKE 1 TABLET BY MOUTH TWICE A DAY, Disp: 180 tablet, Rfl: 1   furosemide (LASIX) 20 MG tablet, TAKE 1 TABLET BY MOUTH EVERY DAY (Patient taking differently: Take 20 mg by mouth daily as needed for fluid.), Disp: 90 tablet, Rfl: 3   Omega-3 Fatty Acids (FISH OIL) 1200 MG CAPS, Take 1,200 mg by mouth in the morning and at bedtime., Disp: , Rfl:    predniSONE (DELTASONE) 10 MG tablet, Take 2 tablets (20 mg total) by mouth daily with breakfast., Disp: 64 tablet, Rfl: 1   sulfamethoxazole-trimethoprim (BACTRIM DS) 800-160 MG tablet, Take 1 tablet by mouth 3 (three) times a week., Disp: 12 tablet, Rfl: 1   apixaban (ELIQUIS) 5 MG TABS tablet, TAKE 1 TABLET BY MOUTH TWICE A DAY, Disp: 60 tablet, Rfl: 6

## 2021-12-07 NOTE — Patient Instructions (Signed)
Continue prednisone '20mg'$  daily   Continue Bactrim DS 1 tab 3 days per week  We will check labs and chest x-ray today  Follow up in 2 months.

## 2021-12-09 ENCOUNTER — Other Ambulatory Visit (HOSPITAL_COMMUNITY): Payer: Self-pay | Admitting: Physician Assistant

## 2021-12-10 LAB — ANTI-NUCLEAR AB-TITER (ANA TITER)
ANA TITER: 1:80 {titer} — ABNORMAL HIGH
ANA Titer 1: 1:40 {titer} — ABNORMAL HIGH

## 2021-12-10 LAB — ANA: Anti Nuclear Antibody (ANA): POSITIVE — AB

## 2021-12-15 ENCOUNTER — Telehealth: Payer: Self-pay | Admitting: Pulmonary Disease

## 2021-12-15 NOTE — Telephone Encounter (Signed)
Please let patient know her ANA testis positive with low titer.  We will refer her to rheumatology for further evaluation.  She can taper her prednisone dose to '15mg'$  daily and stop the bactrim.  Thanks, JD

## 2021-12-18 ENCOUNTER — Telehealth: Payer: Self-pay | Admitting: Pulmonary Disease

## 2021-12-18 DIAGNOSIS — R768 Other specified abnormal immunological findings in serum: Secondary | ICD-10-CM

## 2021-12-18 DIAGNOSIS — J849 Interstitial pulmonary disease, unspecified: Secondary | ICD-10-CM

## 2021-12-18 NOTE — Telephone Encounter (Signed)
Anna Starr, MD Physician Pulmonology   Telephone Encounter Signed   Creation Time: 12/15/2021  2:22 PM   Signed     Please let patient know her ANA testis positive with low titer.   We will refer her to rheumatology for further evaluation.   She can taper her prednisone dose to '15mg'$  daily and stop the bactrim.   Thanks, JD          Called and spoke with pt letting her know info per JD and she verbalized understanding. Nothing further needed.

## 2021-12-18 NOTE — Telephone Encounter (Signed)
See encounter from 11/27.

## 2021-12-21 ENCOUNTER — Other Ambulatory Visit: Payer: Self-pay | Admitting: Pulmonary Disease

## 2021-12-21 ENCOUNTER — Telehealth: Payer: Self-pay | Admitting: Cardiology

## 2021-12-21 DIAGNOSIS — J8489 Other specified interstitial pulmonary diseases: Secondary | ICD-10-CM

## 2021-12-21 NOTE — Telephone Encounter (Signed)
Patient is calling to talk with Dr. Curt Bears or nurse. Please call back

## 2021-12-21 NOTE — Telephone Encounter (Signed)
Pt called to update [place] that she was seeing [facility] next month. She will let me know what decision is made after seeing them.

## 2021-12-22 NOTE — Telephone Encounter (Signed)
Patient is following up requesting to speak directly with Venida Jarvis, RN. She states it is regarding an appointment at [facility].

## 2021-12-22 NOTE — Telephone Encounter (Signed)
Pt was wondering if she should go to both [facility] and [facility] for the consults. Pt aware that we discussed this yesterday and was advised it is her decision.  We only  referred her to [facility] and not sure where the [facility] referral came from. Pt states she prefers to stay with [facility] referral and will cancel the [facility] visit. She appreciates my follow up call

## 2022-01-01 DIAGNOSIS — J8489 Other specified interstitial pulmonary diseases: Secondary | ICD-10-CM | POA: Diagnosis not present

## 2022-01-01 DIAGNOSIS — D6869 Other thrombophilia: Secondary | ICD-10-CM | POA: Diagnosis not present

## 2022-01-01 DIAGNOSIS — Z1389 Encounter for screening for other disorder: Secondary | ICD-10-CM | POA: Diagnosis not present

## 2022-01-01 DIAGNOSIS — I700 Atherosclerosis of aorta: Secondary | ICD-10-CM | POA: Diagnosis not present

## 2022-01-01 DIAGNOSIS — I482 Chronic atrial fibrillation, unspecified: Secondary | ICD-10-CM | POA: Diagnosis not present

## 2022-01-01 DIAGNOSIS — Z6825 Body mass index (BMI) 25.0-25.9, adult: Secondary | ICD-10-CM | POA: Diagnosis not present

## 2022-01-01 DIAGNOSIS — R7303 Prediabetes: Secondary | ICD-10-CM | POA: Diagnosis not present

## 2022-01-01 DIAGNOSIS — Z0000 Encounter for general adult medical examination without abnormal findings: Secondary | ICD-10-CM | POA: Diagnosis not present

## 2022-01-13 ENCOUNTER — Other Ambulatory Visit (HOSPITAL_COMMUNITY): Payer: Self-pay | Admitting: Physician Assistant

## 2022-01-19 ENCOUNTER — Ambulatory Visit: Payer: Medicare PPO | Admitting: Internal Medicine

## 2022-01-23 ENCOUNTER — Encounter: Payer: Self-pay | Admitting: Internal Medicine

## 2022-01-23 ENCOUNTER — Ambulatory Visit: Payer: Medicare PPO | Admitting: Internal Medicine

## 2022-01-23 VITALS — BP 137/80 | HR 65 | Ht 63.0 in | Wt 143.0 lb

## 2022-01-23 DIAGNOSIS — M8580 Other specified disorders of bone density and structure, unspecified site: Secondary | ICD-10-CM | POA: Diagnosis not present

## 2022-01-23 MED ORDER — ALENDRONATE SODIUM 70 MG PO TABS: 70.0000 mg | ORAL_TABLET | ORAL | 3 refills | Status: DC

## 2022-01-23 NOTE — Progress Notes (Signed)
Name: Anna Mathews  MRN/ DOB: [id number], 07/13/1940    Age/ Sex: 82 y.o., female     PCP: Kathyrn Lass, MD   Reason for Endocrinology Evaluation: Osteoporosis     Initial Endocrinology Clinic Visit: 08/25/2018    PATIENT IDENTIFIER: Anna Mathews is a 82 y.o., female with a past medical history of Breast Ca ( S/P lumpectomy 01/27/2020) ,A.Fib, HTN , dyslipidemia ,and GERD . She has followed with [facility] since 8/3/2020for consultative assistance with management of her Osteoporosis.   HISTORICAL SUMMARY:   Pt was diagnosed with osteopenia:2013   Menarche at age : 50 Menopausal at age : 01/23/1988 Fracture Hx:  Finger fracture from a fall Hx of HRT: no FH of osteoporosis or hip fracture: no Prior Hx of anti-estrogenic therapy :Evista in 2016 Prior Hx of anti-resorptive therapy : Fosmax -worse GERD,Prolia started 01/2017, the second dose was 08/06/2018, 03/24/2019,10/01/2019  In our office she received  Prolia on 03/24/2019,  10/01/2019  Her insurance stopped covering [place] in 2022 and we opted to give her one dose of Reclast 05/11/2020  Reclast infusion 05/11/2020  Pt has been on anti-resorptive therapy due to a 10-yr FRAX of > 3.0 at the hip    THYROID HISTORY: She was noted with multinodular goiter on thyroid ultrasound dated 03/28/2021 with a 2.7 left thyroid lobe nodule. NONE of the nodules meet criteria for biopsy or follow-up  SUBJECTIVE:    Today (01/23/2022):  Anna Mathews is here for a follow up on Osteopenia.  She is accompanied by her daughter  As far as her osteoporosis goes, She was started on Prolia  in 2019 , and took last Prolia injection 09/2019, we had to switch to Reclast as the patient insurance did not cover Prolia anymore She received Reclast injection in early 2022 without any side effects    She sees oncology for history of breast cancer She was seen by pulmonary for interstitial lung disease 12/07/2021 on antibiotics and prednisone. 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   Hyperlipidemia    Hypertension    Osteopenia    Postmenopausal hormone replacement therapy 1989 - 07/2000   Past Surgical History:  Past Surgical History:  Procedure Laterality Date   BREAST DUCTAL SYSTEM EXCISION Left 06/2002   duct ectasia with fibrosis - atypical lobular hyperplasia with micro calcifications - tamoxifen X 28 months then change to Evista 10/06   BREAST LUMPECTOMY WITH RADIOACTIVE SEED LOCALIZATION Right 01/27/2020   Procedure: RIGHT BREAST LUMPECTOMY WITH RADIOACTIVE SEED LOCALIZATION;  Surgeon: Coralie Keens, MD;  Location: [place];  Service: General;  Laterality: Right;   BREAST SURGERY Left 4/99   breast biopsy - fibrosis   BRONCHIAL BIOPSY  10/11/2021   Procedure: BRONCHIAL BIOPSIES;  Surgeon: Freddi Starr, MD;  Location[facility] ENDOSCOPY;  Service: Pulmonary;;   BRONCHIAL WASHINGS  10/11/2021   Procedure: BRONCHIAL WASHINGS;  Surgeon: Erin Fulling,  Cheryle Horsfall, MD;  Location: [place];  Service: Pulmonary;;   CARDIOVERSION N/A 07/16/2017   Procedure: CARDIOVERSION;  Surgeon: Jerline Pain, MD;  Location: [facility] ENDOSCOPY;  Service: Cardiovascular;  Laterality: N/A;   CARDIOVERSION N/A 10/20/2018   Procedure: CARDIOVERSION;  Surgeon: Buford Dresser, MD;  Location: [place];  Service: Cardiovascular;  Laterality: N/A;   CATARACT EXTRACTION W/ INTRAOCULAR LENS IMPLANT Right    CESAREAN SECTION      X 2   HYSTEROSCOPY  4/95   w/D&C   TEE WITHOUT CARDIOVERSION N/A 10/20/2018   Procedure: TRANSESOPHAGEAL ECHOCARDIOGRAM (TEE);  Surgeon: Buford Dresser, MD;  Location: [facility] ENDOSCOPY;  Service: Cardiovascular;  Laterality: N/A;   VIDEO BRONCHOSCOPY N/A 10/11/2021   Procedure: VIDEO BRONCHOSCOPY WITH FLUORO;  Surgeon: Freddi Starr, MD;  Location: [facility] ENDOSCOPY;  Service: Pulmonary;  Laterality: N/A;   Social History:  reports that she quit smoking about 54 years ago. Her smoking use included cigarettes. She started smoking about 65 years ago. She has a 3.00 pack-year smoking history. She has never used smokeless tobacco. She reports that she does not currently use alcohol. She reports that she does not use drugs. Family History:  Family History  Problem Relation Age of Onset   Hypertension Mother    Heart disease Father    COPD Father    Diabetes Maternal Grandfather    Diabetes Son    Rheum arthritis Brother    Kidney cancer Brother    Breast cancer Maternal Grandmother    Diabetes Maternal Aunt    Diabetes Maternal Aunt      HOME MEDICATIONS: Allergies as of 01/23/2022       Reactions   Atorvastatin Other (See Comments)   Other reaction(s): messed with her liver   Crab [shellfish Allergy] Itching   Full body itching   Penicillins Other (See Comments)   REACTION: "ITCHING IN EYES" Has patient had a PCN reaction causing immediate rash, facial/tongue/throat swelling, SOB or lightheadedness with hypotension: Unknown Has patient had a PCN reaction causing severe rash involving mucus membranes or skin necrosis: Unknown Has patient had a PCN reaction that required hospitalization: No Has patient had a PCN reaction occurring within the last 10 years: No If all of the above answers are "NO", then may proceed with Cephalosporin use.   Diltiazem Other (See Comments)   Rash, burning and itching   Amlodipine Itching   Purpura rash        Medication List         Accurate as of January 23, 2022 11:42 AM. If you have any questions, ask your nurse or doctor.          STOP taking these medications    sulfamethoxazole-trimethoprim 800-160 MG tablet Commonly known as: BACTRIM DS Stopped by: Dorita Sciara, MD       TAKE these medications    albuterol 108 (90 Base) MCG/ACT inhaler Commonly known as: VENTOLIN HFA Inhale 2 puffs into the lungs every 6 (six) hours as needed for wheezing or shortness of breath.   bisoprolol 5 MG tablet Commonly known as: ZEBETA TAKE 1 TABLET (5 MG TOTAL) BY MOUTH DAILY.   busPIRone 15 MG tablet Commonly known as: BUSPAR Take 7.5 mg by mouth 2 (two) times daily.   CALCIUM 1200 PO Take 1,200 mg by mouth daily.   Eliquis 5 MG Tabs tablet Generic drug: apixaban TAKE 1 TABLET BY MOUTH TWICE A DAY   Fish Oil 1200 MG Caps Take 1,200 mg by mouth  in the morning and at bedtime.   flecainide 150 MG tablet Commonly known as: TAMBOCOR TAKE 1 TABLET BY MOUTH TWICE A DAY   furosemide 20 MG tablet Commonly known as: LASIX TAKE 1 TABLET BY MOUTH EVERY DAY What changed:  when to take this reasons to take this   Linzess 145 MCG Caps capsule Generic drug: linaclotide Take 145 mcg by mouth daily before breakfast.   predniSONE 10 MG tablet Commonly known as: DELTASONE TAKE 2 TABLETS BY MOUTH DAILY WITH BREAKFAST.   rosuvastatin 5 MG tablet Commonly known as: CRESTOR Take 5 mg by mouth daily.          OBJECTIVE:   PHYSICAL EXAM: VS: BP 137/80 (BP Location: Left Arm, Patient Position: Sitting, Cuff Size: Small)   Pulse 65   Ht _0  (1.6 m)   Wt 143 lb (64.9 kg)   LMP 01/23/1988 (Approximate)   SpO2 95%   BMI 25.33 kg/m   EXAM: General: Pt appears well and is in NAD  Neck: General: Supple without adenopathy. Thyroid: Thyroid size normal.  No goiter or nodules appreciated.  Lungs: Clear with good BS bilat with no rales, rhonchi, or wheezes  Heart: Auscultation: RRR.  Abdomen:  Normoactive bowel sounds, soft, nontender, without masses or organomegaly palpable  Extremities:  BL LE: No pretibial edema normal ROM and strength.  Skin: Hair: Texture and amount normal with gender appropriate distribution Skin Inspection: No rashes Skin Palpation: Skin temperature, texture, and thickness normal to palpation  Mental Status: Judgment, insight: Intact Orientation: Oriented to time, place, and person Mood and affect: No depression, anxiety, or agitation     DATA REVIEWED:   Latest Reference Range & Units 12/07/21 13:57  Sodium 135 - 145 mEq/L 141  Potassium 3.5 - 5.1 mEq/L 4.0  Chloride 96 - 112 mEq/L 103  CO2 19 - 32 mEq/L 33 (H)  Glucose 70 - 99 mg/dL 120 (H)  BUN 6 - 23 mg/dL 32 (H)  Creatinine 0.40 - 1.20 mg/dL 1.04  Calcium 8.4 - 10.5 mg/dL 9.5  Alkaline Phosphatase 39 - 117 U/L 48  Albumin 3.5 - 5.2 g/dL 4.0  AST 0 - 37 U/L 13  ALT 0 - 35 U/L 19  Total Protein 6.0 - 8.3 g/dL 6.4  Total Bilirubin 0.2 - 1.2 mg/dL 0.5  GFR >60.00 mL/min 49.92 (L)      DXA 05/03/2020     T - score  Change from 2018  AP - 0.20 Up 6% *  RFN -0.7    Right total hip  -0.8 Up 4.6% *  LFN 1.6    Left total hip -0.4 Up 11.8% *       ASSESSMENT / PLAN / RECOMMENDATIONS:   Osteopenia:   -The patient has completed a total of 3 years on Prolia(Started prolia in 01/2017, with the last dose 09/2019) , the reason for treatment is because she met criteria for antiresorptive therapy due to at 10-year FRAX of > 3% at the hips. -She is intolerant to oral bisphosphonates , has been on Evista in 2016. -Received reclast 05/11/2020 -Her bone density in 04/2020 showed significant improvement in her BMD -She has been on Prednisone chronically since 03/2021 . We discussed trying alendronate again , no recent GERD symptoms  -Will proceed with DXA 04/2022 _1   - Emphasized the importance of calcium intake     Medications  Start Alendronate 70 mg weekly  Continue calcium/vitamin D 600  mg twice daily   Follow-up in 1  year   Signed electronically by: Mack Guise, MD  [facility] Endocrinology  [facility] [address] [place], [place] [address] Phone: [phone] FAX: [phone]      CC: Kathyrn Lass, [place] [address] Phone: [phone]  Fax: [phone]   Return to Endocrinology clinic as below: Future Appointments  Date Time Provider [place]  02/07/2022  1:30 PM Freddi Starr, MD [facility] None  03/22/2022 11:30 AM Malka So R, PA [facility] None  04/02/2022  1:20 PM Collier Salina, MD [facility] None

## 2022-01-23 NOTE — Patient Instructions (Signed)
-   Continue Calcium tablets twice a day  - Start Fosamax 70 mg weekly

## 2022-01-24 DIAGNOSIS — Z853 Personal history of malignant neoplasm of breast: Secondary | ICD-10-CM | POA: Diagnosis not present

## 2022-01-24 DIAGNOSIS — R928 Other abnormal and inconclusive findings on diagnostic imaging of breast: Secondary | ICD-10-CM | POA: Diagnosis not present

## 2022-01-31 ENCOUNTER — Inpatient Hospital Stay: Admission: RE | Admit: 2022-01-31 | Payer: Medicare PPO | Source: Ambulatory Visit

## 2022-01-31 DIAGNOSIS — R001 Bradycardia, unspecified: Secondary | ICD-10-CM | POA: Diagnosis not present

## 2022-01-31 DIAGNOSIS — I480 Paroxysmal atrial fibrillation: Secondary | ICD-10-CM | POA: Diagnosis not present

## 2022-01-31 DIAGNOSIS — I4819 Other persistent atrial fibrillation: Secondary | ICD-10-CM | POA: Diagnosis not present

## 2022-01-31 DIAGNOSIS — Z7901 Long term (current) use of anticoagulants: Secondary | ICD-10-CM | POA: Diagnosis not present

## 2022-01-31 DIAGNOSIS — Z79899 Other long term (current) drug therapy: Secondary | ICD-10-CM | POA: Diagnosis not present

## 2022-01-31 DIAGNOSIS — Z5181 Encounter for therapeutic drug level monitoring: Secondary | ICD-10-CM | POA: Diagnosis not present

## 2022-02-05 ENCOUNTER — Ambulatory Visit (INDEPENDENT_AMBULATORY_CARE_PROVIDER_SITE_OTHER)
Admission: RE | Admit: 2022-02-05 | Discharge: 2022-02-05 | Disposition: A | Discharge status: Home / Self Care | Payer: Medicare PPO | Source: Ambulatory Visit | Attending: Internal Medicine | Admitting: Internal Medicine

## 2022-02-05 DIAGNOSIS — M8580 Other specified disorders of bone density and structure, unspecified site: Secondary | ICD-10-CM | POA: Diagnosis not present

## 2022-02-07 ENCOUNTER — Encounter: Payer: Self-pay | Admitting: Pulmonary Disease

## 2022-02-07 ENCOUNTER — Ambulatory Visit (INDEPENDENT_AMBULATORY_CARE_PROVIDER_SITE_OTHER): Payer: Medicare PPO | Admitting: Pulmonary Disease

## 2022-02-07 VITALS — BP 128/80 | HR 61 | Ht 63.0 in | Wt 144.0 lb

## 2022-02-07 DIAGNOSIS — J8489 Other specified interstitial pulmonary diseases: Secondary | ICD-10-CM

## 2022-02-07 NOTE — Progress Notes (Signed)
Synopsis: Referred in April 2023 for cough by Yaakov Guthrie, MD  Subjective:   PATIENT ID: Betsy Pries GENDER: female DOB: 02/26/1940, MRN: [id number]  HPI  Chief Complaint  Patient presents with   Follow-up    2 mo f/u    Takayla Baillie is an 82 year old woman, former smoker with atrial fibrillation, GERD, hiatal hernia, and hypertension who returns to pulmonary clinic for organizing pneumonia.   She has been taking 65m of prednisone daily since last visit. She reports her breathing has been stable since last visit. [facility]. Plan is to stop flecainide in the future.  OV 12/07/21 She was seen by BDerl Barrow NP 09/27/21 for progressive dyspnea with HRCT chest 10/02/21 showing upper lobe prodominate irregular GGO and consolidative airspace opacities throught the lungs suggestive of developing fibrotic changes. She was taken for bronchoscopy with BAL and LUL transbronchial biopsies which showed organizing pneumonia. She was placed on 285mof prednisone daily and bactrim DS prophylaxis.   She is feeling somewhat better. She reports her skin has a burning sensation. Denies any rash.  OV 08/24/21 She has completed steroid taper at end of July. She feels her breathing is back to baseline. She has no active complaints at this time.  OV 07/12/21 She continues to feel much better. She is accompanied by her friend today.   She is currently on 3064mf prednisone daily and bactrim DS 1 tab 3 days per week.   Chest radiograph at last visit showed improved infiltrates.  OV 06/07/21 She was started on high dose prednisone taper on 05/12/21 empirically for concern of organizing pneumonia vs pneumonitis related to hydralazine exposure.   She has been on 57m23mednisone and is feeling much better. Her fatigue has resolved and she denies cough. She has mild dyspnea but overal much improved. She has started taking bactrim DS 3  times per week for pneumocystis prophylaxis.   OV 05/08/21 She reports having shortness of breath since the beginning of the year which progressed in mid to late February. She was seeing her cardiology team and her blood pressure medications were being titrated at that time. There was concern she had drug induced rash secondary to amlodipine but it seems that the rash persisted despite switching off this medication. 1/13 for hypertensive urgency but no chest imaging performed at that time. She was given 15mg83mIV hydralazine in the ED on 02/03/21.   She has been adjusting many different antihypertensive agents with the cardiology team. She was started on hydralazine 10mg 17mon 2/7 then called the cardiology office 2/10 with reports of redness on her arms/legs with a burning sensation. The dose was reduced then ultimately stopped on 2/13.   She continued to have progressive dyspnea in early March. She was started on lasix at that time with no improvement. A chest x-ray 03/28/21 was obtained which showed which showed right lower lobe and left perihilar opacities concerning for pneumonia. She was treated with doxycyline without much improvement. She continued to feel weak with low energy. She was given levaquin by her primary care on 3/23 which did not help improve her symptoms. She had x-ray 3/28 which showed progressive multifocal infiltrates. She was then treated with doxycycline again with no improvement. X ray 4/4 showed improvement of opacities on right but persistent left sided opacities.   CT Chest 4/11 showed multiple ground glass opacities in both upper  lobes and right lower lobe.   She continues to have dyspnea, dry cough and fatigue. She has intermittent nausea. She has lost 12-15lbs since last fall. She denies any joint pains and no other skin rashes.   She quit smoking in 1970. Denies any harmful occupational exposures. Her father had emphysema.   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   Hyperlipidemia    Hypertension    Osteopenia    Postmenopausal hormone replacement therapy 1989 - 07/2000     Family History  Problem Relation Age of Onset   Hypertension Mother    Heart disease Father    COPD Father    Diabetes Maternal Grandfather    Diabetes Son    Rheum arthritis Brother    Kidney cancer Brother    Breast cancer Maternal Grandmother    Diabetes Maternal Aunt    Diabetes Maternal Aunt      Social History   Socioeconomic History   Marital status: Widowed    Spouse name: Not on file   Number of children: 2   Years of education: Not on file   Highest education level: Not on file  Occupational History   Not on file  Tobacco Use   Smoking status: Former    Packs/day: 0.25    Years: 12.00    Total pack years: 3.00    Types: Cigarettes    Start date: 01/16/1957    Quit date: 01/23/1968    Years since quitting: 54.0   Smokeless tobacco: Never   Tobacco comments:    Former smoker 08/04/21  Vaping Use   Vaping Use: Never used  Substance and Sexual Activity   Alcohol use: Not Currently    Comment: social   Drug use: No   Sexual activity: Not Currently    Partners: Male    Birth control/protection: Post-menopausal  Other Topics Concern   Not on file  Social History Narrative   Not on file   Social Determinants of Health   Financial Resource Strain: Not on file  Food Insecurity: Not on file  Transportation Needs: Not on file  Physical Activity: Not on file  Stress: Not on file  Social Connections: Not on file  Intimate Partner Violence: Not on file     Allergies  Allergen Reactions   Atorvastatin Other (See Comments)    Other reaction(s): messed with her liver   Crab [Shellfish Allergy] Itching    Full body itching    Penicillins Other (See Comments)    REACTION: "ITCHING IN EYES" Has patient had a PCN reaction causing immediate rash, facial/tongue/throat swelling, SOB or lightheadedness with hypotension: Unknown Has patient had a PCN reaction causing severe rash involving mucus membranes or skin necrosis: Unknown Has patient had a PCN reaction that required hospitalization: No Has patient had a PCN reaction occurring within the last 10 years: No If all of the above answers are "NO", then may proceed with Cephalosporin use.    Diltiazem Other (See Comments)    Rash, burning and itching   Amlodipine Itching    Purpura rash     Outpatient Medications Prior to Visit  Medication Sig Dispense Refill   albuterol (VENTOLIN HFA) 108 (90 Base) MCG/ACT inhaler Inhale 2 puffs into the lungs  every 6 (six) hours as needed for wheezing or shortness of breath. 8 g 6   alendronate (FOSAMAX) 70 MG tablet Take 1 tablet (70 mg total) by mouth every 7 (seven) days. Take with a full glass of water on an empty stomach. 12 tablet 3   apixaban (ELIQUIS) 5 MG TABS tablet TAKE 1 TABLET BY MOUTH TWICE A DAY 60 tablet 6   bisoprolol (ZEBETA) 5 MG tablet TAKE 1 TABLET (5 MG TOTAL) BY MOUTH DAILY. 90 tablet 1   busPIRone (BUSPAR) 15 MG tablet Take 7.5 mg by mouth 2 (two) times daily.     Calcium Carbonate-Vit D-Min (CALCIUM 1200 PO) Take 1,200 mg by mouth daily.     flecainide (TAMBOCOR) 150 MG tablet TAKE 1 TABLET BY MOUTH TWICE A DAY 180 tablet 1   LINZESS 145 MCG CAPS capsule Take 145 mcg by mouth daily before breakfast.     Omega-3 Fatty Acids (FISH OIL) 1200 MG CAPS Take 1,200 mg by mouth in the morning and at bedtime.     predniSONE (DELTASONE) 10 MG tablet TAKE 2 TABLETS BY MOUTH DAILY WITH BREAKFAST. 64 tablet 1   rosuvastatin (CRESTOR) 5 MG tablet Take 5 mg by mouth daily.     furosemide (LASIX) 20 MG tablet TAKE 1 TABLET BY MOUTH EVERY DAY (Patient taking differently: Take 20 mg by mouth daily as needed for fluid.) 90  tablet 3   No facility-administered medications prior to visit.   Review of Systems  Constitutional:  Negative for chills, fever, malaise/fatigue and weight loss.  HENT:  Negative for congestion, sinus pain and sore throat.   Eyes: Negative.   Respiratory:  Negative for cough, hemoptysis, sputum production, shortness of breath and wheezing.   Cardiovascular:  Negative for chest pain, palpitations, orthopnea, claudication and leg swelling.  Gastrointestinal:  Negative for abdominal pain, heartburn, nausea and vomiting.  Genitourinary: Negative.   Musculoskeletal:  Negative for joint pain and myalgias.  Skin:  Negative for rash.  Neurological:  Negative for weakness.  Endo/Heme/Allergies: Negative.   Psychiatric/Behavioral: Negative.      Objective:   Vitals:   02/07/22 1334  BP: 128/80  Pulse: 61  SpO2: 98%  Weight: 144 lb (65.3 kg)  Height: 5' 3"  (1.6 m)   Physical Exam Constitutional:      General: She is not in acute distress.    Appearance: She is not ill-appearing.  HENT:     Head: Normocephalic and atraumatic.  Cardiovascular:     Rate and Rhythm: Normal rate and regular rhythm.     Pulses: Normal pulses.     Heart sounds: Normal heart sounds. No murmur heard. Pulmonary:     Effort: Pulmonary effort is normal.     Breath sounds: No wheezing, rhonchi or rales.  Musculoskeletal:     Right lower leg: No edema.     Left lower leg: No edema.  Skin:    General: Skin is warm and dry.  Neurological:     General: No focal deficit present.     Mental Status: She is alert.  Psychiatric:        Mood and Affect: Mood normal.        Behavior: Behavior normal.        Thought Content: Thought content normal.        Judgment: Judgment normal.    CBC    Component Value Date/Time   WBC 8.6 12/07/2021 1357   RBC 4.64 12/07/2021 1357   HGB 13.6 12/07/2021  1357   HGB 12.7 06/07/2020 1549   HCT 41.8 12/07/2021 1357   HCT 37.1 06/07/2020 1549   PLT 260.0 12/07/2021 1357    PLT 222 06/07/2020 1549   MCV 90.2 12/07/2021 1357   MCV 91 06/07/2020 1549   MCH 30.3 02/03/2021 1045   MCHC 32.5 12/07/2021 1357   RDW 15.8 (H) 12/07/2021 1357   RDW 12.2 06/07/2020 1549   LYMPHSABS 1.1 12/07/2021 1357   MONOABS 0.8 12/07/2021 1357   EOSABS 0.0 12/07/2021 1357   BASOSABS 0.0 12/07/2021 1357      Latest Ref Rng & Units 12/07/2021    1:57 PM 10/11/2021   10:00 AM 07/26/2021   10:11 AM  BMP  Glucose 70 - 99 mg/dL 120  117  105   BUN 6 - 23 mg/dL 32  27  41   Creatinine 0.40 - 1.20 mg/dL 1.04  0.86  1.00   Sodium 135 - 145 mEq/L 141  142  142   Potassium 3.5 - 5.1 mEq/L 4.0  4.3  4.0   Chloride 96 - 112 mEq/L 103  106  107   CO2 19 - 32 mEq/L 33  27    Calcium 8.4 - 10.5 mg/dL 9.5  9.2     Chest imaging: CXR 12/07/21 Near complete resolution of bilateral pulmonary opacities with possible mild residual postinflammatory scarring on the left. No acute findings.  HRCT Chest 10/02/21 1. Fluctuant bilateral, upper lobe predominant irregular, somewhat geographic ground-glass and consolidative airspace opacities throughout the lungs, on today's exam with a more dense, consolidative appearance when compared to prior examination dated 05/02/2021, with some suggestion of developing fibrotic change. Unusual findings are generally nonspecific and infectious or inflammatory, and particularly would be in keeping with reported history of drug reaction, general differential considerations with this appearance including eosinophilic pneumonia, organizing pneumonia, and atypical infection. 2. Lobular air trapping on expiratory phase imaging consistent with small airways disease. 3. Coronary artery disease.  CXR 06/07/21 Resolution of prior mixed interstitial and airspace disease, compatible with resolved pneumonia, with background of chronic changes.  CT Chest 05/02/21 Mediastinum/Nodes: 2.4 cm left thyroid nodule is noted. Esophagus is unremarkable. Mildly enlarged  precarinal adenopathy is noted which most likely is reactive or inflammatory in etiology.   Lungs/Pleura: No pneumothorax or pleural effusion is noted. Multiple ground-glass opacities are noted in both upper lobes and to some degree the right lower lobe, which may represent multifocal inflammation or sequela of prior infection. Bilateral lower lobe opacities are noted, right greater than left, concerning for pneumonia.  PFT:     No data to display          Labs:  Path:  Echo 02/04/21: LV EF 60-65%. RV size and systolic function is normal. LA size is moderately dilated.   Heart Catheterization:  Assessment & Plan:   Organizing pneumonia ([place]) - Plan: CT CHEST HIGH RESOLUTION  Discussion: Rosabella Edgin is an 82 year old woman, former smoker with atrial fibrillation, GERD, hiatal hernia, and hypertension who returns to pulmonary clinic for organizing pneumonia.   She was treated with high dose prednisone taper for suspected drug induced pneumonitis vs organizing pneumonia related to hydralazine exposure vs flecainide use.  She started prednisone 50m daily on 05/12/21 and completed slow taper on 08/19/21. Her respiratory status had returned to baseline but then dyspnea and cough returned and HRCT chest 10/02/21 showed fluctuant upper lobe interstitial opacities. She was taken for bronchoscopy on 10/11/21 with transbronchial biopsies confirming organizing pneumonia. She  was placed on 4m daily with bactrim DS 3 days per week 9/26.   We will plan for rapid taper off prednisone over the next month.   Prednisone Taper: 161mdaily 1/18 to 1/25 1066maily 1/26 to 2/2 5mg9mily 2/3 to 2/10  Will order HRCT Chest to be done at end of January vs early February.  Follow up in 6 weeks   [facility] Office: [phone] Outpatient Medications:    albuterol (VENTOLIN HFA) 108 (90 Base) MCG/ACT inhaler, Inhale 2 puffs into the lungs  every 6 (six) hours as needed for wheezing or shortness of breath., Disp: 8 g, Rfl: 6   alendronate (FOSAMAX) 70 MG tablet, Take 1 tablet (70 mg total) by mouth every 7 (seven) days. Take with a full glass of water on an empty stomach., Disp: 12 tablet, Rfl: 3   apixaban (ELIQUIS) 5 MG TABS tablet, TAKE 1 TABLET BY MOUTH TWICE A DAY, Disp: 60 tablet, Rfl: 6   bisoprolol (ZEBETA) 5 MG tablet, TAKE 1 TABLET (5 MG TOTAL) BY MOUTH DAILY., Disp: 90 tablet, Rfl: 1   busPIRone (BUSPAR) 15 MG tablet, Take 7.5 mg by mouth 2 (two) times daily., Disp: , Rfl:    Calcium Carbonate-Vit D-Min (CALCIUM 1200 PO), Take 1,200 mg by mouth daily., Disp: , Rfl:    flecainide (TAMBOCOR) 150 MG tablet, TAKE 1 TABLET BY MOUTH TWICE A DAY, Disp: 180 tablet, Rfl: 1   LINZESS 145 MCG CAPS capsule, Take 145 mcg by mouth daily before breakfast., Disp: , Rfl:    Omega-3 Fatty Acids (FISH OIL) 1200 MG CAPS, Take 1,200 mg by mouth in the morning and at bedtime., Disp: , Rfl:    predniSONE (DELTASONE) 10 MG tablet, TAKE 2 TABLETS BY MOUTH DAILY WITH BREAKFAST., Disp: 64 tablet, Rfl: 1   rosuvastatin (CRESTOR) 5 MG tablet, Take 5 mg by mouth daily., Disp: , Rfl:

## 2022-02-07 NOTE — Patient Instructions (Addendum)
Prednisone Taper: '15mg'$  daily 1/18 to 1/25 '10mg'$  daily 1/26 to 2/2 '5mg'$  daily 2/3 to 2/10  We will schedule you for CT Chest scan late January or early February  Please call sooner if your shortness of breath returns before your follow up visit  Follow up in 6 weeks

## 2022-02-12 ENCOUNTER — Other Ambulatory Visit: Payer: Self-pay | Admitting: Physician Assistant

## 2022-02-15 ENCOUNTER — Telehealth: Payer: Self-pay | Admitting: Pulmonary Disease

## 2022-02-15 NOTE — Telephone Encounter (Signed)
PT called saying she is on her last 8 pills of Pred. She has Pneumonia.  Is it OK to have a Covid and flu shot now?   Please call to advise @ [phone]

## 2022-02-15 NOTE — Telephone Encounter (Signed)
Dr. Erin Fulling, please advise when you think it would be safe for pt to get the flu and covid vaccines.

## 2022-02-15 NOTE — Telephone Encounter (Signed)
She is safe to get flu and covid vaccines once she finishes the steroid taper.  Thanks, JD

## 2022-02-16 NOTE — Telephone Encounter (Signed)
Called and spoke to patient and informed her that she can safely get the covid and flu vaccine once she finishes her prednisone taper. She verbalized understanding. Nothing further needed

## 2022-02-17 ENCOUNTER — Other Ambulatory Visit: Payer: Self-pay | Admitting: Pulmonary Disease

## 2022-02-17 DIAGNOSIS — J8489 Other specified interstitial pulmonary diseases: Secondary | ICD-10-CM

## 2022-02-20 ENCOUNTER — Other Ambulatory Visit: Payer: Self-pay | Admitting: Pulmonary Disease

## 2022-02-20 DIAGNOSIS — J8489 Other specified interstitial pulmonary diseases: Secondary | ICD-10-CM

## 2022-02-23 ENCOUNTER — Ambulatory Visit (HOSPITAL_COMMUNITY)
Admission: RE | Admit: 2022-02-23 | Discharge: 2022-02-23 | Disposition: A | Discharge status: Home / Self Care | Payer: Medicare PPO | Source: Ambulatory Visit | Attending: Pulmonary Disease | Admitting: Pulmonary Disease

## 2022-02-23 DIAGNOSIS — M2578 Osteophyte, vertebrae: Secondary | ICD-10-CM | POA: Diagnosis not present

## 2022-02-23 DIAGNOSIS — I517 Cardiomegaly: Secondary | ICD-10-CM | POA: Diagnosis not present

## 2022-02-23 DIAGNOSIS — J8489 Other specified interstitial pulmonary diseases: Secondary | ICD-10-CM | POA: Diagnosis not present

## 2022-02-23 DIAGNOSIS — R918 Other nonspecific abnormal finding of lung field: Secondary | ICD-10-CM | POA: Diagnosis not present

## 2022-02-23 DIAGNOSIS — I2510 Atherosclerotic heart disease of native coronary artery without angina pectoris: Secondary | ICD-10-CM | POA: Diagnosis not present

## 2022-02-23 DIAGNOSIS — K449 Diaphragmatic hernia without obstruction or gangrene: Secondary | ICD-10-CM | POA: Diagnosis not present

## 2022-02-23 DIAGNOSIS — I700 Atherosclerosis of aorta: Secondary | ICD-10-CM | POA: Diagnosis not present

## 2022-02-23 DIAGNOSIS — J849 Interstitial pulmonary disease, unspecified: Secondary | ICD-10-CM | POA: Diagnosis not present

## 2022-02-27 DIAGNOSIS — I10 Essential (primary) hypertension: Secondary | ICD-10-CM | POA: Diagnosis not present

## 2022-02-27 DIAGNOSIS — R001 Bradycardia, unspecified: Secondary | ICD-10-CM | POA: Diagnosis not present

## 2022-02-27 DIAGNOSIS — I4819 Other persistent atrial fibrillation: Secondary | ICD-10-CM | POA: Diagnosis not present

## 2022-03-01 ENCOUNTER — Encounter (HOSPITAL_COMMUNITY): Payer: Self-pay | Admitting: *Deleted

## 2022-03-02 ENCOUNTER — Telehealth: Payer: Self-pay | Admitting: Pulmonary Disease

## 2022-03-02 NOTE — Telephone Encounter (Signed)
ATC X1 LVM for patient to call the office back

## 2022-03-05 NOTE — Telephone Encounter (Signed)
Called and spoke with patient. Patient stated that her breathing has improved and that she feels like it has gotten better. I advised patient that if she notices it getting worse again to give [place] a call back.  Nothing further needed.

## 2022-03-07 DIAGNOSIS — D1801 Hemangioma of skin and subcutaneous tissue: Secondary | ICD-10-CM | POA: Diagnosis not present

## 2022-03-07 DIAGNOSIS — L821 Other seborrheic keratosis: Secondary | ICD-10-CM | POA: Diagnosis not present

## 2022-03-16 DIAGNOSIS — Z7901 Long term (current) use of anticoagulants: Secondary | ICD-10-CM | POA: Diagnosis not present

## 2022-03-16 DIAGNOSIS — R001 Bradycardia, unspecified: Secondary | ICD-10-CM | POA: Diagnosis not present

## 2022-03-16 DIAGNOSIS — J8489 Other specified interstitial pulmonary diseases: Secondary | ICD-10-CM | POA: Diagnosis not present

## 2022-03-16 DIAGNOSIS — Z01818 Encounter for other preprocedural examination: Secondary | ICD-10-CM | POA: Diagnosis not present

## 2022-03-16 DIAGNOSIS — I10 Essential (primary) hypertension: Secondary | ICD-10-CM | POA: Diagnosis not present

## 2022-03-16 DIAGNOSIS — I4819 Other persistent atrial fibrillation: Secondary | ICD-10-CM | POA: Diagnosis not present

## 2022-03-19 ENCOUNTER — Ambulatory Visit: Payer: Medicare PPO | Admitting: Pulmonary Disease

## 2022-03-19 ENCOUNTER — Encounter: Payer: Self-pay | Admitting: Pulmonary Disease

## 2022-03-19 VITALS — BP 124/76 | HR 73 | Ht 63.0 in | Wt 144.8 lb

## 2022-03-19 DIAGNOSIS — J8489 Other specified interstitial pulmonary diseases: Secondary | ICD-10-CM

## 2022-03-19 NOTE — Patient Instructions (Addendum)
I am glad you have done well off the prednisone.  Your CT Chest scan shows near resolution of the inflammation of the lungs.   We will see you in 3 months with pulmonary function tests

## 2022-03-19 NOTE — Progress Notes (Signed)
Synopsis: Referred in April 2023 for cough by Anna Guthrie, MD  Subjective:   PATIENT ID: Anna Mathews GENDER: female DOB: Feb 13, 1940, MRN: [id number]  HPI  Chief Complaint  Patient presents with   Follow-up    92mof/u. States she has finished the prednisone and is feeling fine.    AHoni Zanardiis an 82year old woman, former smoker with atrial fibrillation, GERD, hiatal hernia, and hypertension who returns to pulmonary clinic for organizing pneumonia.   She completed prednisone on 2/10 and has been feeling well. No issues with shortness of breath, cough or wheezing.  HRCT Chest 02/23/2022 shows near resolution of inflammatory changes.  [facility]   OV 02/07/2022 She has been taking '20mg'$  of prednisone daily since last visit. She reports her breathing has been stable since last visit. [facility]. Plan is to stop flecainide in the future.  OV 12/07/21 She was seen by BDerl Barrow NP 09/27/21 for progressive dyspnea with HRCT chest 10/02/21 showing upper lobe prodominate irregular GGO and consolidative airspace opacities throught the lungs suggestive of developing fibrotic changes. She was taken for bronchoscopy with BAL and LUL transbronchial biopsies which showed organizing pneumonia. She was placed on '20mg'$  of prednisone daily and bactrim DS prophylaxis.   She is feeling somewhat better. She reports her skin has a burning sensation. Denies any rash.  OV 08/24/21 She has completed steroid taper at end of July. She feels her breathing is back to baseline. She has no active complaints at this time.  OV 07/12/21 She continues to feel much better. She is accompanied by her friend today.   She is currently on '30mg'$  of prednisone daily and bactrim DS 1 tab 3 days per week.   Chest radiograph at last visit showed improved infiltrates.  OV 06/07/21 She was started on high dose  prednisone taper on 05/12/21 empirically for concern of organizing pneumonia vs pneumonitis related to hydralazine exposure.   She has been on '40mg'$  prednisone and is feeling much better. Her fatigue has resolved and she denies cough. She has mild dyspnea but overal much improved. She has started taking bactrim DS 3 times per week for pneumocystis prophylaxis.   OV 05/08/21 She reports having shortness of breath since the beginning of the year which progressed in mid to late February. She was seeing her cardiology team and her blood pressure medications were being titrated at that time. There was concern she had drug induced rash secondary to amlodipine but it seems that the rash persisted despite switching off this medication. 1/13 for hypertensive urgency but no chest imaging performed at that time. She was given '15mg'$  of IV hydralazine in the ED on 02/03/21.   She has been adjusting many different antihypertensive agents with the cardiology team. She was started on hydralazine '10mg'$  BID on 2/7 then called the cardiology office 2/10 with reports of redness on her arms/legs with a burning sensation. The dose was reduced then ultimately stopped on 2/13.   She continued to have progressive dyspnea in early March. She was started on lasix at that time with no improvement. A chest x-ray 03/28/21 was obtained which showed which showed right lower lobe and left perihilar opacities concerning for pneumonia. She was treated with doxycyline without much improvement. She continued to feel weak with low energy. She was given levaquin by her primary care on 3/23 which  did not help improve her symptoms. She had x-ray 3/28 which showed progressive multifocal infiltrates. She was then treated with doxycycline again with no improvement. X ray 4/4 showed improvement of opacities on right but persistent left sided opacities.   CT Chest 4/11 showed multiple ground glass opacities in both upper lobes and right lower lobe.   She  continues to have dyspnea, dry cough and fatigue. She has intermittent nausea. She has lost 12-15lbs since last fall. She denies any joint pains and no other skin rashes.   She quit smoking in 1970. Denies any harmful occupational exposures. Her father had emphysema.   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   Hyperlipidemia    Hypertension    Osteopenia    Postmenopausal hormone replacement therapy 1989 - 07/2000     Family History  Problem Relation Age of Onset   Hypertension Mother    Heart disease Father    COPD Father    Diabetes Maternal Grandfather    Diabetes Son    Rheum arthritis Brother    Kidney cancer Brother    Breast cancer Maternal Grandmother    Diabetes Maternal Aunt    Diabetes Maternal Aunt      Social History   Socioeconomic History   Marital status: Widowed    Spouse name: Not on file   Number of children: 2   Years of education: Not on file   Highest education level: Not on file  Occupational History   Not on file  Tobacco Use   Smoking status: Former    Packs/day: 0.25    Years: 12.00    Total pack years: 3.00    Types: Cigarettes    Start date: 01/16/1957    Quit date: 01/23/1968    Years since quitting: 54.1   Smokeless tobacco: Never   Tobacco comments:    Former smoker 08/04/21  Vaping Use   Vaping Use: Never used  Substance and Sexual Activity   Alcohol use: Not Currently    Comment: social   Drug use: No   Sexual activity: Not Currently    Partners: Male    Birth control/protection: Post-menopausal  Other Topics Concern   Not on file  Social History Narrative   Not on file   Social Determinants of Health   Financial Resource Strain: Not on file  Food Insecurity: Not on file  Transportation Needs: Not on file   Physical Activity: Not on file  Stress: Not on file  Social Connections: Not on file  Intimate Partner Violence: Not on file     Allergies  Allergen Reactions   Atorvastatin Other (See Comments)    Other reaction(s): messed with her liver   Crab [Shellfish Allergy] Itching    Full body itching   Penicillins Other (See Comments)    REACTION: "ITCHING IN EYES" Has patient had a PCN reaction causing immediate rash, facial/tongue/throat swelling, SOB or lightheadedness with hypotension: Unknown Has patient had a PCN reaction causing severe rash involving mucus membranes or skin necrosis: Unknown Has patient had a PCN reaction that required hospitalization: No Has patient had a PCN reaction occurring within the last 10 years: No If all of the above answers are "NO", then may proceed with Cephalosporin use.  Diltiazem Other (See Comments)    Rash, burning and itching   Amlodipine Itching    Purpura rash     Outpatient Medications Prior to Visit  Medication Sig Dispense Refill   albuterol (VENTOLIN HFA) 108 (90 Base) MCG/ACT inhaler Inhale 2 puffs into the lungs every 6 (six) hours as needed for wheezing or shortness of breath. 8 g 6   alendronate (FOSAMAX) 70 MG tablet Take 1 tablet (70 mg total) by mouth every 7 (seven) days. Take with a full glass of water on an empty stomach. 12 tablet 3   apixaban (ELIQUIS) 5 MG TABS tablet TAKE 1 TABLET BY MOUTH TWICE A DAY 60 tablet 6   bisoprolol (ZEBETA) 5 MG tablet TAKE 1 TABLET (5 MG TOTAL) BY MOUTH DAILY. 90 tablet 1   busPIRone (BUSPAR) 15 MG tablet Take 7.5 mg by mouth 2 (two) times daily.     Calcium Carbonate-Vit D-Min (CALCIUM 1200 PO) Take 1,200 mg by mouth daily.     flecainide (TAMBOCOR) 150 MG tablet TAKE 1 TABLET BY MOUTH TWICE A DAY 180 tablet 1   rosuvastatin (CRESTOR) 5 MG tablet Take 5 mg by mouth daily.     LINZESS 145 MCG CAPS capsule Take 145 mcg by mouth daily before breakfast.     Omega-3 Fatty Acids (FISH OIL) 1200 MG  CAPS Take 1,200 mg by mouth in the morning and at bedtime.     predniSONE (DELTASONE) 10 MG tablet '15MG'$  DAILY 1/18 TO 1/25 THEN '10MG'$  DAILY 1/26 TO 2/2 AND THEN '5MG'$  DAILY 2/3 TO 2/10 40 tablet 0   No facility-administered medications prior to visit.   Review of Systems  Constitutional:  Negative for chills, fever, malaise/fatigue and weight loss.  HENT:  Negative for congestion, sinus pain and sore throat.   Eyes: Negative.   Respiratory:  Negative for cough, hemoptysis, sputum production, shortness of breath and wheezing.   Cardiovascular:  Negative for chest pain, palpitations, orthopnea, claudication and leg swelling.  Gastrointestinal:  Negative for abdominal pain, heartburn, nausea and vomiting.  Genitourinary: Negative.   Musculoskeletal:  Negative for joint pain and myalgias.  Skin:  Negative for rash.  Neurological:  Negative for weakness.  Endo/Heme/Allergies: Negative.   Psychiatric/Behavioral: Negative.      Objective:   Vitals:   03/19/22 1359  BP: 124/76  Pulse: 73  SpO2: 98%  Weight: 144 lb 12.8 oz (65.7 kg)  Height: '5\' 3"'$  (1.6 m)   Physical Exam Constitutional:      General: She is not in acute distress.    Appearance: She is not ill-appearing.  HENT:     Head: Normocephalic and atraumatic.  Cardiovascular:     Rate and Rhythm: Normal rate and regular rhythm.     Pulses: Normal pulses.     Heart sounds: Normal heart sounds. No murmur heard. Pulmonary:     Effort: Pulmonary effort is normal.     Breath sounds: No wheezing, rhonchi or rales.  Musculoskeletal:     Right lower leg: No edema.     Left lower leg: No edema.  Skin:    General: Skin is warm and dry.  Neurological:     General: No focal deficit present.     Mental Status: She is alert.    CBC    Component Value Date/Time   WBC 8.6 12/07/2021 1357   RBC 4.64 12/07/2021 1357   HGB 13.6 12/07/2021 1357   HGB 12.7 06/07/2020 1549   HCT 41.8 12/07/2021 1357  HCT 37.1 06/07/2020 1549   PLT  260.0 12/07/2021 1357   PLT 222 06/07/2020 1549   MCV 90.2 12/07/2021 1357   MCV 91 06/07/2020 1549   MCH 30.3 02/03/2021 1045   MCHC 32.5 12/07/2021 1357   RDW 15.8 (H) 12/07/2021 1357   RDW 12.2 06/07/2020 1549   LYMPHSABS 1.1 12/07/2021 1357   MONOABS 0.8 12/07/2021 1357   EOSABS 0.0 12/07/2021 1357   BASOSABS 0.0 12/07/2021 1357      Latest Ref Rng & Units 12/07/2021    1:57 PM 10/11/2021   10:00 AM 07/26/2021   10:11 AM  BMP  Glucose 70 - 99 mg/dL 120  117  105   BUN 6 - 23 mg/dL 32  27  41   Creatinine 0.40 - 1.20 mg/dL 1.04  0.86  1.00   Sodium 135 - 145 mEq/L 141  142  142   Potassium 3.5 - 5.1 mEq/L 4.0  4.3  4.0   Chloride 96 - 112 mEq/L 103  106  107   CO2 19 - 32 mEq/L 33  27    Calcium 8.4 - 10.5 mg/dL 9.5  9.2     Chest imaging: HRCT Chest 02/23/22 1. Almost completely resolved heterogeneous and ground-glass airspace opacity seen on prior examination, with a small focus of residual heterogeneous opacity of the anterior right upper lobe and in the peripheral left lung base. Findings are consistent with resolving infection or inflammation. 2. Criss Rosales appearing scarring of the medial right lower lobe overlying prominent disc osteophytes. 3. Cardiomegaly and coronary artery disease.  CXR 12/07/21 Near complete resolution of bilateral pulmonary opacities with possible mild residual postinflammatory scarring on the left. No acute findings.  HRCT Chest 10/02/21 1. Fluctuant bilateral, upper lobe predominant irregular, somewhat geographic ground-glass and consolidative airspace opacities throughout the lungs, on today's exam with a more dense, consolidative appearance when compared to prior examination dated 05/02/2021, with some suggestion of developing fibrotic change. Unusual findings are generally nonspecific and infectious or inflammatory, and particularly would be in keeping with reported history of drug reaction, general differential considerations with this  appearance including eosinophilic pneumonia, organizing pneumonia, and atypical infection. 2. Lobular air trapping on expiratory phase imaging consistent with small airways disease. 3. Coronary artery disease.  CXR 06/07/21 Resolution of prior mixed interstitial and airspace disease, compatible with resolved pneumonia, with background of chronic changes.  CT Chest 05/02/21 Mediastinum/Nodes: 2.4 cm left thyroid nodule is noted. Esophagus is unremarkable. Mildly enlarged precarinal adenopathy is noted which most likely is reactive or inflammatory in etiology.   Lungs/Pleura: No pneumothorax or pleural effusion is noted. Multiple ground-glass opacities are noted in both upper lobes and to some degree the right lower lobe, which may represent multifocal inflammation or sequela of prior infection. Bilateral lower lobe opacities are noted, right greater than left, concerning for pneumonia.  PFT:     No data to display          Labs:  Path:  Echo 02/04/21: LV EF 60-65%. RV size and systolic function is normal. LA size is moderately dilated.   Heart Catheterization:  Assessment & Plan:   Organizing pneumonia [facility]) - Plan: Pulmonary Function Test  Discussion: Anna Mathews is an 82 year old woman, former smoker with atrial fibrillation, GERD, hiatal hernia, and hypertension who returns to pulmonary clinic for organizing pneumonia.   She was treated with high dose prednisone taper for suspected drug induced pneumonitis vs organizing pneumonia related to hydralazine exposure vs flecainide use.  She started prednisone '40mg'$  daily on 05/12/21 and completed slow taper on 08/19/21. Her respiratory status had returned to baseline but then dyspnea and cough returned and HRCT chest 10/02/21 showed fluctuant upper lobe interstitial opacities. She was taken for bronchoscopy on 10/11/21 with transbronchial biopsies confirming organizing pneumonia. She was placed on '20mg'$  daily with bactrim DS 3  days per week 10/17/21 and tapered off steroids 03/03/22.   HRCT Chest 02/23/22 shows near resolution of ground glass opacities.  Follow up in 3 months with pulmonary function tests.  Freda Jackson, MD [facility] Office: [phone]   Current Outpatient Medications:    albuterol (VENTOLIN HFA) 108 (90 Base) MCG/ACT inhaler, Inhale 2 puffs into the lungs every 6 (six) hours as needed for wheezing or shortness of breath., Disp: 8 g, Rfl: 6   alendronate (FOSAMAX) 70 MG tablet, Take 1 tablet (70 mg total) by mouth every 7 (seven) days. Take with a full glass of water on an empty stomach., Disp: 12 tablet, Rfl: 3   apixaban (ELIQUIS) 5 MG TABS tablet, TAKE 1 TABLET BY MOUTH TWICE A DAY, Disp: 60 tablet, Rfl: 6   bisoprolol (ZEBETA) 5 MG tablet, TAKE 1 TABLET (5 MG TOTAL) BY MOUTH DAILY., Disp: 90 tablet, Rfl: 1   busPIRone (BUSPAR) 15 MG tablet, Take 7.5 mg by mouth 2 (two) times daily., Disp: , Rfl:    Calcium Carbonate-Vit D-Min (CALCIUM 1200 PO), Take 1,200 mg by mouth daily., Disp: , Rfl:    flecainide (TAMBOCOR) 150 MG tablet, TAKE 1 TABLET BY MOUTH TWICE A DAY, Disp: 180 tablet, Rfl: 1   rosuvastatin (CRESTOR) 5 MG tablet, Take 5 mg by mouth daily., Disp: , Rfl:

## 2022-03-20 ENCOUNTER — Other Ambulatory Visit: Payer: Self-pay | Admitting: Pulmonary Disease

## 2022-03-20 DIAGNOSIS — J8489 Other specified interstitial pulmonary diseases: Secondary | ICD-10-CM

## 2022-03-22 ENCOUNTER — Ambulatory Visit (HOSPITAL_COMMUNITY): Payer: Medicare PPO | Admitting: Physician Assistant

## 2022-03-22 DIAGNOSIS — R002 Palpitations: Secondary | ICD-10-CM | POA: Diagnosis not present

## 2022-03-22 DIAGNOSIS — I4819 Other persistent atrial fibrillation: Secondary | ICD-10-CM | POA: Diagnosis not present

## 2022-03-22 DIAGNOSIS — Z01810 Encounter for preprocedural cardiovascular examination: Secondary | ICD-10-CM | POA: Diagnosis not present

## 2022-03-22 DIAGNOSIS — I480 Paroxysmal atrial fibrillation: Secondary | ICD-10-CM | POA: Diagnosis not present

## 2022-03-22 DIAGNOSIS — Z7901 Long term (current) use of anticoagulants: Secondary | ICD-10-CM | POA: Diagnosis not present

## 2022-03-22 DIAGNOSIS — I10 Essential (primary) hypertension: Secondary | ICD-10-CM | POA: Diagnosis not present

## 2022-03-23 DIAGNOSIS — I4819 Other persistent atrial fibrillation: Secondary | ICD-10-CM | POA: Diagnosis not present

## 2022-03-23 DIAGNOSIS — Z23 Encounter for immunization: Secondary | ICD-10-CM | POA: Diagnosis not present

## 2022-03-23 DIAGNOSIS — R7303 Prediabetes: Secondary | ICD-10-CM | POA: Diagnosis not present

## 2022-03-23 DIAGNOSIS — Z87891 Personal history of nicotine dependence: Secondary | ICD-10-CM | POA: Diagnosis not present

## 2022-03-23 DIAGNOSIS — Z7901 Long term (current) use of anticoagulants: Secondary | ICD-10-CM | POA: Diagnosis not present

## 2022-03-23 DIAGNOSIS — Z79899 Other long term (current) drug therapy: Secondary | ICD-10-CM | POA: Diagnosis not present

## 2022-03-23 DIAGNOSIS — I119 Hypertensive heart disease without heart failure: Secondary | ICD-10-CM | POA: Diagnosis not present

## 2022-03-23 DIAGNOSIS — J8489 Other specified interstitial pulmonary diseases: Secondary | ICD-10-CM | POA: Diagnosis not present

## 2022-03-24 DIAGNOSIS — Z87891 Personal history of nicotine dependence: Secondary | ICD-10-CM | POA: Diagnosis not present

## 2022-03-24 DIAGNOSIS — Z79899 Other long term (current) drug therapy: Secondary | ICD-10-CM | POA: Diagnosis not present

## 2022-03-24 DIAGNOSIS — I4819 Other persistent atrial fibrillation: Secondary | ICD-10-CM | POA: Diagnosis not present

## 2022-03-24 DIAGNOSIS — I119 Hypertensive heart disease without heart failure: Secondary | ICD-10-CM | POA: Diagnosis not present

## 2022-03-24 DIAGNOSIS — Z23 Encounter for immunization: Secondary | ICD-10-CM | POA: Diagnosis not present

## 2022-03-24 DIAGNOSIS — R7303 Prediabetes: Secondary | ICD-10-CM | POA: Diagnosis not present

## 2022-03-24 DIAGNOSIS — J8489 Other specified interstitial pulmonary diseases: Secondary | ICD-10-CM | POA: Diagnosis not present

## 2022-03-24 DIAGNOSIS — Z7901 Long term (current) use of anticoagulants: Secondary | ICD-10-CM | POA: Diagnosis not present

## 2022-03-27 ENCOUNTER — Telehealth: Payer: Self-pay | Admitting: *Deleted

## 2022-03-27 NOTE — Telephone Encounter (Signed)
Followed up w/ pt who reports she had an ablation at [facility] last Friday, 3/1. Pt aware I will place a recall for August/sept for follow up with Dr. Curt Bears. Patient verbalized understanding and agreeable to plan.

## 2022-04-01 NOTE — Progress Notes (Deleted)
Office Visit Note  Patient: Anna Mathews             Date of Birth: 1940-03-08           MRN: [id number]             PCP: Kathyrn Lass, MD Referring: Freddi Starr, MD Visit Date: 04/02/2022 Occupation: '@GUAROCC'$ @  Subjective:  No chief complaint on file.   History of Present Illness: Anna Mathews is a 83 y.o. female here for evaluation of positive ANA checked in association with organizing pneumonia possible drug reaction to hydralazine treatment. ***   Labs reviewed ANA 1:40 speckled 1:80 DFS  Activities of Daily Living:  Patient reports morning stiffness for *** {minute/hour:19697}.   Patient {ACTIONS;DENIES/REPORTS:21021675::"Denies"} nocturnal pain.  Difficulty dressing/grooming: {ACTIONS;DENIES/REPORTS:21021675::"Denies"} Difficulty climbing stairs: {ACTIONS;DENIES/REPORTS:21021675::"Denies"} Difficulty getting out of chair: {ACTIONS;DENIES/REPORTS:21021675::"Denies"} Difficulty using hands for taps, buttons, cutlery, and/or writing: {ACTIONS;DENIES/REPORTS:21021675::"Denies"}  No Rheumatology ROS completed.   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   Diverticulitis 03/16/2021   Dysplastic nevus 03/16/2021   Gastro-esophageal reflux disease without esophagitis 03/16/2021   Hyperglycemia 03/16/2021   Loss of appetite 03/16/2021   Paresthesia 03/16/2021   Prediabetes 03/16/2021   Hypertensive urgency 02/04/2021   Labile blood pressure 02/04/2021   TIA (transient ischemic attack) 02/03/2021   Hypercalcemia 11/03/2020   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   Atypical atrial flutter ([place])    Chest pain 07/02/2017   Hyperlipidemia 07/01/2017   Chondromalacia of left patella  03/20/2017   Tear of lateral meniscus of knee 03/20/2017   Tear of medial meniscus of knee 03/20/2017   Arthritis of left knee 02/21/2017   Pain in left knee 02/21/2017   Anticoagulated 07/08/2015   Long term current use of anticoagulant therapy 06/09/2013   HTN (hypertension) 03/31/2013   Unspecified vitamin D deficiency 03/31/2013   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/31/2013   Osteopenia 03/31/2013   Paroxysmal atrial fibrillation ([place]) [id number]   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    Past Medical History:  Diagnosis Date   Anxiety    Atrial fibrillation ([place]) 09/2009   Breast cancer ([place])    Diverticulosis 09/2006   Dyspnea    GERD (gastroesophageal reflux disease)    Hiatal hernia    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   Hyperlipidemia    Hypertension    Osteopenia    Postmenopausal hormone replacement therapy 1989 - 07/2000    Family History  Problem Relation Age of Onset   Hypertension Mother    Heart disease Father    COPD Father    Diabetes Maternal Grandfather    Diabetes Son    Rheum arthritis Brother    Kidney cancer Brother    Breast cancer Maternal Grandmother    Diabetes Maternal Aunt    Diabetes Maternal Aunt    Past Surgical History:  Procedure Laterality Date   BREAST DUCTAL SYSTEM EXCISION Left 06/2002   duct ectasia with fibrosis - atypical lobular hyperplasia with micro calcifications - tamoxifen X 28 months then change to Evista 10/06   BREAST LUMPECTOMY WITH RADIOACTIVE SEED LOCALIZATION Right 01/27/2020   Procedure: RIGHT BREAST LUMPECTOMY WITH RADIOACTIVE SEED LOCALIZATION;  Surgeon: Coralie Keens,  MD;  Location: [place];  Service: General;  Laterality: Right;   BREAST SURGERY Left 4/99   breast biopsy - fibrosis   BRONCHIAL BIOPSY  10/11/2021   Procedure: BRONCHIAL BIOPSIES;  Surgeon: Freddi Starr, MD;  Location: [place];  Service: Pulmonary;;   BRONCHIAL WASHINGS  10/11/2021   Procedure:  BRONCHIAL WASHINGS;  Surgeon: Freddi Starr, MD;  Location: [place];  Service: Pulmonary;;   CARDIOVERSION N/A 07/16/2017   Procedure: CARDIOVERSION;  Surgeon: Jerline Pain, MD;  Location: [facility] ENDOSCOPY;  Service: Cardiovascular;  Laterality: N/A;   CARDIOVERSION N/A 10/20/2018   Procedure: CARDIOVERSION;  Surgeon: Buford Dresser, MD;  Location: [place];  Service: Cardiovascular;  Laterality: N/A;   CATARACT EXTRACTION W/ INTRAOCULAR LENS IMPLANT Right    CESAREAN SECTION     X 2   HYSTEROSCOPY  4/95   w/D&C   TEE WITHOUT CARDIOVERSION N/A 10/20/2018   Procedure: TRANSESOPHAGEAL ECHOCARDIOGRAM (TEE);  Surgeon: Buford Dresser, MD;  Location: [facility] ENDOSCOPY;  Service: Cardiovascular;  Laterality: N/A;   VIDEO BRONCHOSCOPY N/A 10/11/2021   Procedure: VIDEO BRONCHOSCOPY WITH FLUORO;  Surgeon: Freddi Starr, MD;  Location: [facility] ENDOSCOPY;  Service: Pulmonary;  Laterality: N/A;   Social History   Social History Narrative   Not on file   Immunization History  Administered Date(s) Administered   Influenza Split 11/03/2010, 10/23/2011, 09/24/2013, 11/21/2014, 11/09/2016, 11/23/2020   Influenza, High Dose Seasonal PF 11/07/2016, 10/18/2017, 09/22/2018   Influenza,inj,quad, With Preservative 11/07/2016   Influenza-Unspecified 11/03/2010, 10/23/2011, 09/24/2013, 11/21/2014, 11/07/2016   Moderna Covid-19 Vaccine Bivalent Booster 55yr & up 11/16/2020   Moderna Sars-Covid-2 Vaccination 02/04/2019, 03/04/2019, 11/05/2019, 06/22/2020   Pneumococcal Conjugate-13 11/17/2013   Pneumococcal Polysaccharide-23 01/08/2005   Td 03/12/2014   Tdap 08/16/2009   Zoster Recombinat (Shingrix) 05/13/2017, 12/22/2017   Zoster, Live 05/20/2006, 12/22/2017     Objective: Vital Signs: LMP 01/23/1988 (Approximate)    Physical Exam   Musculoskeletal Exam: ***  CDAI Exam: CDAI Score: -- Patient Global: --; Provider Global: -- Swollen: --; Tender: -- Joint Exam 04/02/2022   No  joint exam has been documented for this visit   There is currently no information documented on the homunculus. Go to the Rheumatology activity and complete the homunculus joint exam.  Investigation: No additional findings.  Imaging: No results found.  Recent Labs: Lab Results  Component Value Date   WBC 8.6 12/07/2021   HGB 13.6 12/07/2021   PLT 260.0 12/07/2021   NA 141 12/07/2021   K 4.0 12/07/2021   CL 103 12/07/2021   CO2 33 (H) 12/07/2021   GLUCOSE 120 (H) 12/07/2021   BUN 32 (H) 12/07/2021   CREATININE 1.04 12/07/2021   BILITOT 0.5 12/07/2021   ALKPHOS 48 12/07/2021   AST 13 12/07/2021   ALT 19 12/07/2021   PROT 6.4 12/07/2021   ALBUMIN 4.0 12/07/2021   CALCIUM 9.5 12/07/2021   GFRAA 66 10/14/2018    Speciality Comments: No specialty comments available.  Procedures:  No procedures performed Allergies: Atorvastatin, Crab [shellfish allergy], Penicillins, Diltiazem, and Amlodipine   Assessment / Plan:     Visit Diagnoses: No diagnosis found.  Orders: No orders of the defined types were placed in this encounter.  No orders of the defined types were placed in this encounter.   Face-to-face time spent with patient was *** minutes. Greater than 50% of time was spent in counseling and coordination of care.  Follow-Up Instructions: No follow-ups on file.   CCollier Salina MD  Note -  This record has been created using [employer].  Chart creation errors have been sought, but may not always  have been located. Such creation errors do not reflect on  the standard of medical care.

## 2022-04-02 ENCOUNTER — Encounter: Payer: Medicare PPO | Admitting: Internal Medicine

## 2022-04-20 ENCOUNTER — Other Ambulatory Visit (HOSPITAL_COMMUNITY): Payer: Self-pay | Admitting: Physician Assistant

## 2022-04-23 DIAGNOSIS — Z6825 Body mass index (BMI) 25.0-25.9, adult: Secondary | ICD-10-CM | POA: Diagnosis not present

## 2022-04-23 DIAGNOSIS — S76211A Strain of adductor muscle, fascia and tendon of right thigh, initial encounter: Secondary | ICD-10-CM | POA: Diagnosis not present

## 2022-05-21 ENCOUNTER — Ambulatory Visit (INDEPENDENT_AMBULATORY_CARE_PROVIDER_SITE_OTHER): Payer: Medicare PPO | Admitting: Pulmonary Disease

## 2022-05-21 ENCOUNTER — Encounter: Payer: Self-pay | Admitting: Pulmonary Disease

## 2022-05-21 ENCOUNTER — Ambulatory Visit: Payer: Medicare PPO | Admitting: Pulmonary Disease

## 2022-05-21 VITALS — BP 134/72 | HR 95 | Ht 63.5 in | Wt 138.0 lb

## 2022-05-21 DIAGNOSIS — J8489 Other specified interstitial pulmonary diseases: Secondary | ICD-10-CM | POA: Diagnosis not present

## 2022-05-21 DIAGNOSIS — I4819 Other persistent atrial fibrillation: Secondary | ICD-10-CM

## 2022-05-21 DIAGNOSIS — J849 Interstitial pulmonary disease, unspecified: Secondary | ICD-10-CM | POA: Diagnosis not present

## 2022-05-21 LAB — PULMONARY FUNCTION TEST
DL/VA % pred: 178 %
DL/VA: 7.25 ml/min/mmHg/L
DLCO cor % pred: 126 %
DLCO cor: 23.07 ml/min/mmHg
DLCO unc % pred: 119 %
DLCO unc: 21.93 ml/min/mmHg
FEF 25-75 Post: 1.43 L/sec
FEF 25-75 Pre: 1 L/sec
FEF2575-%Change-Post: 43 %
FEF2575-%Pred-Post: 116 %
FEF2575-%Pred-Pre: 81 %
FEV1-%Change-Post: 13 %
FEV1-%Pred-Post: 82 %
FEV1-%Pred-Pre: 72 %
FEV1-Post: 1.47 L
FEV1-Pre: 1.3 L
FEV1FVC-%Change-Post: 7 %
FEV1FVC-%Pred-Pre: 104 %
FEV6-%Change-Post: 5 %
FEV6-%Pred-Post: 79 %
FEV6-%Pred-Pre: 74 %
FEV6-Post: 1.8 L
FEV6-Pre: 1.7 L
FEV6FVC-%Change-Post: 0 %
FEV6FVC-%Pred-Post: 106 %
FEV6FVC-%Pred-Pre: 106 %
FVC-%Change-Post: 5 %
FVC-%Pred-Post: 74 %
FVC-%Pred-Pre: 70 %
FVC-Post: 1.8 L
FVC-Pre: 1.7 L
Post FEV1/FVC ratio: 82 %
Post FEV6/FVC ratio: 100 %
Pre FEV1/FVC ratio: 76 %
Pre FEV6/FVC Ratio: 100 %
RV % pred: 92 %
RV: 2.24 L
TLC % pred: 80 %
TLC: 3.98 L

## 2022-05-21 NOTE — Progress Notes (Signed)
Full PFT Performed Today  

## 2022-05-21 NOTE — Patient Instructions (Signed)
Full PFT Performed Today  

## 2022-05-21 NOTE — Progress Notes (Signed)
Synopsis: Referred in April 2023 for cough by Leodis Sias, MD  Subjective:   PATIENT ID: Anna Mathews GENDER: female DOB: [birth year], MRN: [id number]  HPI  Chief Complaint  Patient presents with   Follow-up    F/U after PFT. States she has doing well since last visit.    Anna Mathews is an 82 year old woman, former smoker with atrial fibrillation, GERD, hiatal hernia, and hypertension who returns to pulmonary clinic for organizing pneumonia.   She had ablation procedure done at [facility] and remained in atrial fibrillation.   PFTs show minimal obstructive airways disease and significant response to bronchodilators. FEV1/FVC ratio are within normal limits but her flow volume loop shows a degree of obstructive pattern.   She denies issues with cough, wheezing or shortness of breath. She has intermittent dizziness at rest.   OV 03/19/22 She completed prednisone on 2/10 and has been feeling well. No issues with shortness of breath, cough or wheezing.  HRCT Chest 02/23/2022 shows near resolution of inflammatory changes.  She has atrial fibrillation ablation coming up at [facility].   OV 02/07/2022 She has been taking 20mg  of prednisone daily since last visit. She reports her breathing has been stable since last visit. She met with EP at [facility] and is scheduled for an atrial fibrillation ablation in March. Plan is to stop flecainide in the future.  OV 12/07/21 She was seen by Buelah Manis, NP 09/27/21 for progressive dyspnea with HRCT chest 10/02/21 showing upper lobe prodominate irregular GGO and consolidative airspace opacities throught the lungs suggestive of developing fibrotic changes. She was taken for bronchoscopy with BAL and LUL transbronchial biopsies which showed organizing pneumonia. She was placed on 20mg  of prednisone daily and bactrim DS prophylaxis.   She is feeling somewhat better. She reports her skin has a burning sensation. Denies any rash.  OV 08/24/21 She has completed steroid taper  at end of July. She feels her breathing is back to baseline. She has no active complaints at this time.  OV 07/12/21 She continues to feel much better. She is accompanied by her friend today.   She is currently on 30mg  of prednisone daily and bactrim DS 1 tab 3 days per week.   Chest radiograph at last visit showed improved infiltrates.  OV 06/07/21 She was started on high dose prednisone taper on 05/12/21 empirically for concern of organizing pneumonia vs pneumonitis related to hydralazine exposure.   She has been on 40mg  prednisone and is feeling much better. Her fatigue has resolved and she denies cough. She has mild dyspnea but overal much improved. She has started taking bactrim DS 3 times per week for pneumocystis prophylaxis.   OV 05/08/21 She reports having shortness of breath since the beginning of the year which progressed in mid to late February. She was seeing her cardiology team and her blood pressure medications were being titrated at that time. There was concern she had drug induced rash secondary to amlodipine but it seems that the rash persisted despite switching off this medication. 1/13 for hypertensive urgency but no chest imaging performed at that time. She was given 15mg  of IV hydralazine in the ED on 02/03/21.   She has been adjusting many different antihypertensive agents with the cardiology team. She was started on hydralazine 10mg  BID on 2/7 then called the cardiology office 2/10 with reports of redness on her arms/legs with a burning sensation. The dose was reduced then ultimately stopped on 2/13.   She continued to have  progressive dyspnea in early March. She was started on lasix at that time with no improvement. A chest x-ray 03/28/21 was obtained which showed which showed right lower lobe and left perihilar opacities concerning for pneumonia. She was treated with doxycyline without much improvement. She continued to feel weak with low energy. She was given levaquin by her  primary care on 3/23 which did not help improve her symptoms. She had x-ray 3/28 which showed progressive multifocal infiltrates. She was then treated with doxycycline again with no improvement. X ray 4/4 showed improvement of opacities on right but persistent left sided opacities.   CT Chest 4/11 showed multiple ground glass opacities in both upper lobes and right lower lobe.   She continues to have dyspnea, dry cough and fatigue. She has intermittent nausea. She has lost 12-15lbs since last fall. She denies any joint pains and no other skin rashes.   She quit smoking in 1970. Denies any harmful occupational exposures. Her father had emphysema.   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   Hyperlipidemia    Hypertension    Osteopenia    Postmenopausal hormone replacement therapy 1989 - 07/2000     Family History  Problem Relation Age of Onset   Hypertension Mother    Heart disease Father    COPD Father    Diabetes Maternal Grandfather    Diabetes Son    Rheum arthritis Brother    Kidney cancer Brother    Breast cancer Maternal Grandmother    Diabetes Maternal Aunt    Diabetes Maternal Aunt      Social History   Socioeconomic History   Marital status: Widowed    Spouse name: Not on file   Number of children: 2   Years of education: Not on file   Highest education level: Not on file  Occupational History   Not on file  Tobacco Use   Smoking status: Former    Packs/day: 0.25    Years: 12.00    Additional pack years: 0.00    Total pack years: 3.00    Types: Cigarettes    Start date: 01/16/1957    Quit date: 01/23/1968    Years since quitting: 54.3   Smokeless tobacco: Never   Tobacco comments:    Former smoker 08/04/21  Vaping Use   Vaping Use: Never used   Substance and Sexual Activity   Alcohol use: Not Currently    Comment: social   Drug use: No   Sexual activity: Not Currently    Partners: Male    Birth control/protection: Post-menopausal  Other Topics Concern   Not on file  Social History Narrative   Not on file   Social Determinants of Health   Financial Resource Strain: Not on file  Food Insecurity: Not on file  Transportation Needs: Not on file  Physical Activity: Not on file  Stress: Not on file  Social Connections: Not on file  Intimate Partner Violence: Not on file     Allergies  Allergen Reactions   Atorvastatin Other (See Comments)    Other reaction(s): messed with her liver   Crab [Shellfish Allergy] Itching    Full body itching   Penicillins Other (See Comments)    REACTION: "ITCHING IN EYES" Has  patient had a PCN reaction causing immediate rash, facial/tongue/throat swelling, SOB or lightheadedness with hypotension: Unknown Has patient had a PCN reaction causing severe rash involving mucus membranes or skin necrosis: Unknown Has patient had a PCN reaction that required hospitalization: No Has patient had a PCN reaction occurring within the last 10 years: No If all of the above answers are "NO", then may proceed with Cephalosporin use.    Diltiazem Other (See Comments)    Rash, burning and itching   Amlodipine Itching    Purpura rash     Outpatient Medications Prior to Visit  Medication Sig Dispense Refill   apixaban (ELIQUIS) 5 MG TABS tablet TAKE 1 TABLET BY MOUTH TWICE A DAY 60 tablet 6   bisoprolol (ZEBETA) 5 MG tablet TAKE 1 TABLET (5 MG TOTAL) BY MOUTH DAILY. 90 tablet 2   busPIRone (BUSPAR) 15 MG tablet Take 7.5 mg by mouth 2 (two) times daily.     flecainide (TAMBOCOR) 150 MG tablet TAKE 1 TABLET BY MOUTH TWICE A DAY 180 tablet 1   rosuvastatin (CRESTOR) 5 MG tablet Take 5 mg by mouth daily.     albuterol (VENTOLIN HFA) 108 (90 Base) MCG/ACT inhaler Inhale 2 puffs into the lungs every 6 (six)  hours as needed for wheezing or shortness of breath. 8 g 6   alendronate (FOSAMAX) 70 MG tablet Take 1 tablet (70 mg total) by mouth every 7 (seven) days. Take with a full glass of water on an empty stomach. 12 tablet 3   Calcium Carbonate-Vit D-Min (CALCIUM 1200 PO) Take 1,200 mg by mouth daily.     No facility-administered medications prior to visit.   Review of Systems  Constitutional:  Negative for chills, fever, malaise/fatigue and weight loss.  HENT:  Negative for congestion, sinus pain and sore throat.   Eyes: Negative.   Respiratory:  Negative for cough, hemoptysis, sputum production, shortness of breath and wheezing.   Cardiovascular:  Negative for chest pain, palpitations, orthopnea, claudication and leg swelling.  Gastrointestinal:  Negative for abdominal pain, heartburn, nausea and vomiting.  Genitourinary: Negative.   Musculoskeletal:  Negative for joint pain and myalgias.  Skin:  Negative for rash.  Neurological:  Negative for weakness.  Endo/Heme/Allergies: Negative.   Psychiatric/Behavioral: Negative.      Objective:   Vitals:   05/21/22 1021  BP: 134/72  Pulse: 95  SpO2: 98%  Weight: 138 lb (62.6 kg)  Height: 5' 3.5" (1.613 m)   Physical Exam Constitutional:      General: She is not in acute distress.    Appearance: She is not ill-appearing.  HENT:     Head: Normocephalic and atraumatic.  Cardiovascular:     Rate and Rhythm: Normal rate and regular rhythm.     Pulses: Normal pulses.     Heart sounds: Normal heart sounds. No murmur heard. Pulmonary:     Effort: Pulmonary effort is normal.     Breath sounds: No wheezing, rhonchi or rales.  Musculoskeletal:     Right lower leg: No edema.     Left lower leg: No edema.  Skin:    General: Skin is warm and dry.  Neurological:     General: No focal deficit present.     Mental Status: She is alert.    CBC    Component Value Date/Time   WBC 8.6 12/07/2021 1357   RBC 4.64 12/07/2021 1357   HGB 13.6  12/07/2021 1357   HGB 12.7 06/07/2020 1549   HCT 41.8  12/07/2021 1357   HCT 37.1 06/07/2020 1549   PLT 260.0 12/07/2021 1357   PLT 222 06/07/2020 1549   MCV 90.2 12/07/2021 1357   MCV 91 06/07/2020 1549   MCH 30.3 02/03/2021 1045   MCHC 32.5 12/07/2021 1357   RDW 15.8 (H) 12/07/2021 1357   RDW 12.2 06/07/2020 1549   LYMPHSABS 1.1 12/07/2021 1357   MONOABS 0.8 12/07/2021 1357   EOSABS 0.0 12/07/2021 1357   BASOSABS 0.0 12/07/2021 1357      Latest Ref Rng & Units 12/07/2021    1:57 PM 10/11/2021   10:00 AM 07/26/2021   10:11 AM  BMP  Glucose 70 - 99 mg/dL 960  454  098   BUN 6 - 23 mg/dL 32  27  41   Creatinine 0.40 - 1.20 mg/dL 1.19  1.47  8.29   Sodium 135 - 145 mEq/L 141  142  142   Potassium 3.5 - 5.1 mEq/L 4.0  4.3  4.0   Chloride 96 - 112 mEq/L 103  106  107   CO2 19 - 32 mEq/L 33  27    Calcium 8.4 - 10.5 mg/dL 9.5  9.2     Chest imaging: HRCT Chest 02/23/22 1. Almost completely resolved heterogeneous and ground-glass airspace opacity seen on prior examination, with a small focus of residual heterogeneous opacity of the anterior right upper lobe and in the peripheral left lung base. Findings are consistent with resolving infection or inflammation. 2. Parke Simmers appearing scarring of the medial right lower lobe overlying prominent disc osteophytes. 3. Cardiomegaly and coronary artery disease.  CXR 12/07/21 Near complete resolution of bilateral pulmonary opacities with possible mild residual postinflammatory scarring on the left. No acute findings.  HRCT Chest 10/02/21 1. Fluctuant bilateral, upper lobe predominant irregular, somewhat geographic ground-glass and consolidative airspace opacities throughout the lungs, on today's exam with a more dense, consolidative appearance when compared to prior examination dated 05/02/2021, with some suggestion of developing fibrotic change. Unusual findings are generally nonspecific and infectious or inflammatory, and particularly  would be in keeping with reported history of drug reaction, general differential considerations with this appearance including eosinophilic pneumonia, organizing pneumonia, and atypical infection. 2. Lobular air trapping on expiratory phase imaging consistent with small airways disease. 3. Coronary artery disease.  CXR 06/07/21 Resolution of prior mixed interstitial and airspace disease, compatible with resolved pneumonia, with background of chronic changes.  CT Chest 05/02/21 Mediastinum/Nodes: 2.4 cm left thyroid nodule is noted. Esophagus is unremarkable. Mildly enlarged precarinal adenopathy is noted which most likely is reactive or inflammatory in etiology.   Lungs/Pleura: No pneumothorax or pleural effusion is noted. Multiple ground-glass opacities are noted in both upper lobes and to some degree the right lower lobe, which may represent multifocal inflammation or sequela of prior infection. Bilateral lower lobe opacities are noted, right greater than left, concerning for pneumonia.  PFT:    Latest Ref Rng & Units 05/21/2022    8:35 AM  PFT Results  FVC-Pre L 1.70   FVC-Predicted Pre % 70   FVC-Post L 1.80   FVC-Predicted Post % 74   Pre FEV1/FVC % % 76   Post FEV1/FCV % % 82   FEV1-Pre L 1.30   FEV1-Predicted Pre % 72   FEV1-Post L 1.47   DLCO uncorrected ml/min/mmHg 21.93   DLCO UNC% % 119   DLCO corrected ml/min/mmHg 23.07   DLCO COR %Predicted % 126   DLVA Predicted % 178   TLC L 3.98   TLC %  Predicted % 80   RV % Predicted % 92   PFTs show mild obstruction based on flow volume loop with significant bronchodilator response  Labs:  Path:  Echo 02/04/21: LV EF 60-65%. RV size and systolic function is normal. LA size is moderately dilated.   Heart Catheterization:  Assessment & Plan:   No diagnosis found.  Discussion: Anna Mathews is an 82 year old woman, former smoker with atrial fibrillation, GERD, hiatal hernia, and hypertension who returns to  pulmonary clinic for organizing pneumonia.   She was treated with high dose prednisone taper for suspected drug induced pneumonitis vs organizing pneumonia related to hydralazine exposure vs flecainide use.  She started prednisone 40mg  daily on 05/12/21 and completed slow taper on 08/19/21. Her respiratory status had returned to baseline but then dyspnea and cough returned and HRCT chest 10/02/21 showed fluctuant upper lobe interstitial opacities. She was taken for bronchoscopy on 10/11/21 with transbronchial biopsies confirming organizing pneumonia. She was placed on 20mg  daily with bactrim DS 3 days per week 10/17/21 and tapered off steroids 03/03/22. HRCT Chest 02/23/22 shows near resolution of ground glass opacities.  She has been doing well since last visit. PFTs show mild obstruction based on flow volume loop pattern. She is to use albuterol inhaler as needed.  We wills schedule her for home sleep study for persistent atrial fibrillation.  Follow up in 6 months  Melody Comas, MD [facility] Office: [phone]   Current Outpatient Medications:    apixaban (ELIQUIS) 5 MG TABS tablet, TAKE 1 TABLET BY MOUTH TWICE A DAY, Disp: 60 tablet, Rfl: 6   bisoprolol (ZEBETA) 5 MG tablet, TAKE 1 TABLET (5 MG TOTAL) BY MOUTH DAILY., Disp: 90 tablet, Rfl: 2   busPIRone (BUSPAR) 15 MG tablet, Take 7.5 mg by mouth 2 (two) times daily., Disp: , Rfl:    flecainide (TAMBOCOR) 150 MG tablet, TAKE 1 TABLET BY MOUTH TWICE A DAY, Disp: 180 tablet, Rfl: 1   rosuvastatin (CRESTOR) 5 MG tablet, Take 5 mg by mouth daily., Disp: , Rfl:

## 2022-05-21 NOTE — Patient Instructions (Signed)
Your breathing tests show mild obstructive pattern  You can use albuterol inhaler 1-2 puffs every 4-6 hours as needed  We will schedule you for a home sleep study given your persistent atrial fibrillation  Follow up in 6 months

## 2022-05-23 DIAGNOSIS — I4819 Other persistent atrial fibrillation: Secondary | ICD-10-CM | POA: Diagnosis not present

## 2022-05-23 DIAGNOSIS — Z5181 Encounter for therapeutic drug level monitoring: Secondary | ICD-10-CM | POA: Diagnosis not present

## 2022-05-23 DIAGNOSIS — I4892 Unspecified atrial flutter: Secondary | ICD-10-CM | POA: Diagnosis not present

## 2022-05-23 DIAGNOSIS — Z79899 Other long term (current) drug therapy: Secondary | ICD-10-CM | POA: Diagnosis not present

## 2022-05-23 DIAGNOSIS — Z7901 Long term (current) use of anticoagulants: Secondary | ICD-10-CM | POA: Diagnosis not present

## 2022-05-25 DIAGNOSIS — E782 Mixed hyperlipidemia: Secondary | ICD-10-CM | POA: Diagnosis not present

## 2022-05-25 DIAGNOSIS — R7303 Prediabetes: Secondary | ICD-10-CM | POA: Diagnosis not present

## 2022-05-29 DIAGNOSIS — I482 Chronic atrial fibrillation, unspecified: Secondary | ICD-10-CM | POA: Diagnosis not present

## 2022-05-29 DIAGNOSIS — I10 Essential (primary) hypertension: Secondary | ICD-10-CM | POA: Diagnosis not present

## 2022-05-29 DIAGNOSIS — E782 Mixed hyperlipidemia: Secondary | ICD-10-CM | POA: Diagnosis not present

## 2022-05-29 DIAGNOSIS — D6869 Other thrombophilia: Secondary | ICD-10-CM | POA: Diagnosis not present

## 2022-05-29 DIAGNOSIS — I700 Atherosclerosis of aorta: Secondary | ICD-10-CM | POA: Diagnosis not present

## 2022-05-29 DIAGNOSIS — Z7901 Long term (current) use of anticoagulants: Secondary | ICD-10-CM | POA: Diagnosis not present

## 2022-05-29 DIAGNOSIS — E119 Type 2 diabetes mellitus without complications: Secondary | ICD-10-CM | POA: Diagnosis not present

## 2022-06-06 ENCOUNTER — Ambulatory Visit (HOSPITAL_COMMUNITY)
Admission: RE | Admit: 2022-06-06 | Discharge: 2022-06-06 | Disposition: A | Discharge status: Home / Self Care | Payer: Medicare PPO | Source: Ambulatory Visit | Attending: Physician Assistant | Admitting: Physician Assistant

## 2022-06-06 DIAGNOSIS — I480 Paroxysmal atrial fibrillation: Secondary | ICD-10-CM | POA: Diagnosis not present

## 2022-06-06 NOTE — Progress Notes (Signed)
Patient scheduled for DCCV tomorrow at [facility], felt she was back in SR yesterday and wanted to confirm on ECG.  ECG today shows:  SR, 1st degree AV block, IVCD Vent. rate 58 BPM PR interval 226 ms QRS duration 130 ms QT/QTcB 458/449 ms  Patient in SR, she will reach out to her providers at [facility] for next steps.

## 2022-06-24 DIAGNOSIS — G4730 Sleep apnea, unspecified: Secondary | ICD-10-CM | POA: Diagnosis not present

## 2022-06-25 ENCOUNTER — Other Ambulatory Visit: Payer: Self-pay | Admitting: Pulmonary Disease

## 2022-06-25 ENCOUNTER — Other Ambulatory Visit (HOSPITAL_COMMUNITY): Payer: Self-pay | Admitting: Physician Assistant

## 2022-06-25 DIAGNOSIS — J8489 Other specified interstitial pulmonary diseases: Secondary | ICD-10-CM

## 2022-06-26 NOTE — Telephone Encounter (Signed)
Prescription refill request for Eliquis received. Indication:afib Last office visit:5/24 Scr:1.0  3/24 Age: 82 Weight:62.6  kg  Prescription refilled

## 2022-07-14 ENCOUNTER — Ambulatory Visit (INDEPENDENT_AMBULATORY_CARE_PROVIDER_SITE_OTHER): Payer: Medicare PPO

## 2022-07-14 ENCOUNTER — Telehealth: Payer: Self-pay | Admitting: Pulmonary Disease

## 2022-07-14 DIAGNOSIS — G4733 Obstructive sleep apnea (adult) (pediatric): Secondary | ICD-10-CM | POA: Diagnosis not present

## 2022-07-14 DIAGNOSIS — I4819 Other persistent atrial fibrillation: Secondary | ICD-10-CM

## 2022-07-14 NOTE — Telephone Encounter (Signed)
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  CPAP therapy if there is significant daytime sleepiness or other comorbidities including history of CVA or cardiac disease-patient does have a history of atrial fibrillation, will recommend CPAP therapy.  -If CPAP is chosen as an option of treatment auto titrating CPAP with a pressure setting of 5-15 will be appropriate  2.  Watchful waiting with emphasis on weight loss measures, sleep position modification to optimize lateral sleep, elevating the head of the bed by about 30 degrees may also help.  3.  An oral device may be fashioned for the treatment of mild sleep disordered breathing, will involve referral to dentist.   Follow-up as previously scheduled

## 2022-07-16 DIAGNOSIS — Z6823 Body mass index (BMI) 23.0-23.9, adult: Secondary | ICD-10-CM | POA: Diagnosis not present

## 2022-07-16 DIAGNOSIS — R202 Paresthesia of skin: Secondary | ICD-10-CM | POA: Diagnosis not present

## 2022-07-16 DIAGNOSIS — F411 Generalized anxiety disorder: Secondary | ICD-10-CM | POA: Diagnosis not present

## 2022-07-16 DIAGNOSIS — I482 Chronic atrial fibrillation, unspecified: Secondary | ICD-10-CM | POA: Diagnosis not present

## 2022-07-23 NOTE — Telephone Encounter (Signed)
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  CPAP therapy if there is significant daytime sleepiness or other comorbidities including history of CVA or cardiac disease-patient does have a history of atrial fibrillation, will recommend CPAP therapy.   -If CPAP is chosen as an option of treatment auto titrating CPAP with a pressure setting of 5-15 will be appropriate   2.  Watchful waiting with emphasis on weight loss measures, sleep position modification to optimize lateral sleep, elevating the head of the bed by about 30 degrees may also help.   3.  An oral device may be fashioned for the treatment of mild sleep disordered breathing, will involve referral to dentist.     Follow-up as previously scheduled   Patient did make a f/u with Dr.Dewald .  Patient's voice was understanding.Nothing else further needed at this time

## 2022-07-23 NOTE — Telephone Encounter (Signed)
ATC x1 LVM for patient to call  our office back regarding sleep study result's.  

## 2022-07-27 DIAGNOSIS — I4892 Unspecified atrial flutter: Secondary | ICD-10-CM | POA: Diagnosis not present

## 2022-07-27 DIAGNOSIS — I4819 Other persistent atrial fibrillation: Secondary | ICD-10-CM | POA: Diagnosis not present

## 2022-07-27 DIAGNOSIS — Z79899 Other long term (current) drug therapy: Secondary | ICD-10-CM | POA: Diagnosis not present

## 2022-07-27 DIAGNOSIS — Z5181 Encounter for therapeutic drug level monitoring: Secondary | ICD-10-CM | POA: Diagnosis not present

## 2022-08-10 ENCOUNTER — Ambulatory Visit (HOSPITAL_COMMUNITY)
Admission: RE | Admit: 2022-08-10 | Discharge: 2022-08-10 | Disposition: A | Discharge status: Home / Self Care | Payer: Medicare PPO | Source: Ambulatory Visit | Attending: Internal Medicine | Admitting: Internal Medicine

## 2022-08-10 ENCOUNTER — Other Ambulatory Visit: Payer: Self-pay

## 2022-08-10 VITALS — BP 140/82 | HR 87 | Ht 63.5 in | Wt 130.0 lb

## 2022-08-10 DIAGNOSIS — Z7901 Long term (current) use of anticoagulants: Secondary | ICD-10-CM | POA: Diagnosis not present

## 2022-08-10 DIAGNOSIS — Z8249 Family history of ischemic heart disease and other diseases of the circulatory system: Secondary | ICD-10-CM | POA: Diagnosis not present

## 2022-08-10 DIAGNOSIS — I4819 Other persistent atrial fibrillation: Secondary | ICD-10-CM | POA: Insufficient documentation

## 2022-08-10 DIAGNOSIS — D6869 Other thrombophilia: Secondary | ICD-10-CM | POA: Diagnosis not present

## 2022-08-10 DIAGNOSIS — Z79899 Other long term (current) drug therapy: Secondary | ICD-10-CM | POA: Insufficient documentation

## 2022-08-10 DIAGNOSIS — I484 Atypical atrial flutter: Secondary | ICD-10-CM | POA: Diagnosis not present

## 2022-08-10 DIAGNOSIS — I10 Essential (primary) hypertension: Secondary | ICD-10-CM | POA: Diagnosis not present

## 2022-08-10 DIAGNOSIS — J849 Interstitial pulmonary disease, unspecified: Secondary | ICD-10-CM | POA: Diagnosis not present

## 2022-08-10 DIAGNOSIS — E785 Hyperlipidemia, unspecified: Secondary | ICD-10-CM | POA: Insufficient documentation

## 2022-08-10 DIAGNOSIS — I4892 Unspecified atrial flutter: Secondary | ICD-10-CM | POA: Insufficient documentation

## 2022-08-10 LAB — BASIC METABOLIC PANEL
Anion gap: 10 (ref 5–15)
BUN: 34 mg/dL — ABNORMAL HIGH (ref 8–23)
CO2: 24 mmol/L (ref 22–32)
Calcium: 9.3 mg/dL (ref 8.9–10.3)
Chloride: 103 mmol/L (ref 98–111)
Creatinine, Ser: 0.85 mg/dL (ref 0.44–1.00)
GFR, Estimated: >60 mL/min (ref >60)
Glucose, Bld: 129 mg/dL — ABNORMAL HIGH (ref 70–99)
Potassium: 3.9 mmol/L (ref 3.5–5.1)
Sodium: 137 mmol/L (ref 135–145)

## 2022-08-10 LAB — CBC
HCT: 43.4 % (ref 36.0–46.0)
Hemoglobin: 13.6 g/dL (ref 12.0–15.0)
MCH: 29.4 pg (ref 26.0–34.0)
MCHC: 31.3 g/dL (ref 30.0–36.0)
MCV: 93.7 fL (ref 80.0–100.0)
Platelets: 203 10*3/uL (ref 150–400)
RBC: 4.63 MIL/uL (ref 3.87–5.11)
RDW: 13.8 % (ref 11.5–15.5)
WBC: 5.6 10*3/uL (ref 4.0–10.5)
nRBC: 0 % (ref 0.0–0.2)

## 2022-08-10 NOTE — Patient Instructions (Addendum)
Cardioversion scheduled for: Friday, August 9th   - Arrive at the [facility] and go to admitting at 830am   - Do not eat or drink anything after midnight the night prior to your procedure.   - Take all your morning medication (except diabetic medications) with a sip of water prior to arrival.  - You will not be able to drive home after your procedure.    - Do NOT miss any doses of your blood thinner - if you should miss a dose please notify our office immediately.   - If you feel as if you go back into normal rhythm prior to scheduled cardioversion, please notify our office immediately.   If your procedure is canceled in the cardioversion suite you will be charged a cancellation fee.

## 2022-08-10 NOTE — Progress Notes (Addendum)
Primary Care Physician: Sigmund Hazel, MD Primary Cardiologist: Dr Eldridge Dace Primary Electrophysiologist: Dr Elberta Fortis Referring Physician: Otilio Saber PA   Anna Mathews is a 82 y.o. female with a history of HTN, HLD, ILD, atrial flutter, atrial fibrillation who presents for follow up in the [facility]. Patient is on Eliquis for a CHADS2VASC score of 4. She has been maintained on flecainide for rhythm control. She was seen 07/13/21 and found to be in atrial flutter. She presented for DCCV on 07/26/21 but had converted back to SR so the procedure was cancelled. Prior to the onset of her flutter, she had pneumonia and was on a steroid taper. Patient developed itching all over her body after starting diltiazem. This was discontinued and she was started on bisoprolol.   On follow up today, patient reports that she has done well from an afib standpoint since her last visit. However, she continues to have issues with her lungs/pneumonia. 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 She is currently on antibiotic and steroid for pneumonia. No bleeding issues on anticoagulation.   On follow up 08/10/22, patient is currently in atrial flutter. Seen by [facility] EP on 7/5 and appeared to be in atrial flutter per notes. It was noted that patient was accidentally on 150 mg BID for the last couple of weeks. She was reduced down to her prior dose of 75 mg BID. She feels intermittent palpitations when out of rhythm. Patient states she missed a dose of Eliquis last week and would like to count from yesterday for scheduling DCCV.  Today, she denies symptoms of palpitations, chest pain, orthopnea, PND, presyncope, syncope, snoring, daytime somnolence, bleeding, or neurologic sequela. The patient is tolerating medications without difficulties and is otherwise without complaint today.    Atrial Fibrillation Risk Factors:  she  does not have symptoms or diagnosis of sleep apnea. she does not have a history of rheumatic fever.   she has a BMI of Body mass index is 22.67 kg/m.Marland Kitchen Filed Weights   08/10/22 0939  Weight: 59 kg     Family History  Problem Relation Age of Onset   Hypertension Mother    Heart disease Father    COPD Father    Diabetes Maternal Grandfather    Diabetes Son    Rheum arthritis Brother    Kidney cancer Brother    Breast cancer Maternal Grandmother    Diabetes Maternal Aunt    Diabetes Maternal Aunt     Atrial Fibrillation Management history:  Previous antiarrhythmic drugs: flecainide 75 mg BID Previous cardioversions: 2019, 2020 Previous ablations: 03/23/22 ([facility]) CHADS2VASC score: 4 Anticoagulation history: Eliquis   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   Hyperlipidemia    Hypertension    Osteopenia    Postmenopausal hormone replacement therapy 1989 - 07/2000   Past Surgical History:  Procedure Laterality Date   BREAST DUCTAL SYSTEM EXCISION Left 06/2002   duct ectasia with fibrosis - atypical lobular hyperplasia with micro calcifications - tamoxifen X 28 months then change to Evista 10/06   BREAST LUMPECTOMY WITH RADIOACTIVE SEED LOCALIZATION Right 01/27/2020   Procedure: RIGHT BREAST LUMPECTOMY WITH RADIOACTIVE SEED LOCALIZATION;  Surgeon: Abigail Miyamoto, MD;  Location: [facility] OR;  Service: General;  Laterality: Right;   BREAST SURGERY Left 4/99   breast biopsy - fibrosis   BRONCHIAL BIOPSY  10/11/2021   Procedure: BRONCHIAL BIOPSIES;  Surgeon: Martina Sinner, MD;  Location: [facility] ENDOSCOPY;  Service: Pulmonary;;   BRONCHIAL WASHINGS  10/11/2021   Procedure: BRONCHIAL WASHINGS;  Surgeon: Martina Sinner, MD;  Location: [facility] ENDOSCOPY;  Service: Pulmonary;;    CARDIOVERSION N/A 07/16/2017   Procedure: CARDIOVERSION;  Surgeon: Jake Bathe, MD;  Location: [facility] ENDOSCOPY;  Service: Cardiovascular;  Laterality: N/A;   CARDIOVERSION N/A 10/20/2018   Procedure: CARDIOVERSION;  Surgeon: Jodelle Red, MD;  Location: [facility] ENDOSCOPY;  Service: Cardiovascular;  Laterality: N/A;   CATARACT EXTRACTION W/ INTRAOCULAR LENS IMPLANT Right    CESAREAN SECTION     X 2   HYSTEROSCOPY  4/95   w/D&C   TEE WITHOUT CARDIOVERSION N/A 10/20/2018   Procedure: TRANSESOPHAGEAL ECHOCARDIOGRAM (TEE);  Surgeon: Jodelle Red, MD;  Location: [facility] ENDOSCOPY;  Service: Cardiovascular;  Laterality: N/A;   VIDEO BRONCHOSCOPY N/A 10/11/2021   Procedure: VIDEO BRONCHOSCOPY WITH FLUORO;  Surgeon: Martina Sinner, MD;  Location: [facility] ENDOSCOPY;  Service: Pulmonary;  Laterality: N/A;    Current Outpatient Medications  Medication Sig Dispense Refill   bisoprolol (ZEBETA) 5 MG tablet TAKE 1 TABLET (5 MG TOTAL) BY MOUTH DAILY. 90 tablet 2   busPIRone (BUSPAR) 15 MG tablet Take 7.5 mg by mouth 2 (two) times daily.     ELIQUIS 5 MG TABS tablet TAKE 1 TABLET BY MOUTH TWICE A DAY 60 tablet 6   flecainide (TAMBOCOR) 150 MG tablet TAKE 1 TABLET BY MOUTH TWICE A DAY (Patient taking differently: Take 75 mg by mouth 2 (two) times daily.) 180 tablet 1   rosuvastatin (CRESTOR) 5 MG tablet Take 5 mg by mouth daily.     No current facility-administered medications for this encounter.    Allergies  Allergen Reactions   Atorvastatin Other (See Comments)    Other reaction(s): messed with her liver   Crab [Shellfish Allergy] Itching    Full body itching   Penicillins Other (See Comments)    REACTION: "ITCHING IN EYES" Has patient had a PCN reaction causing immediate rash, facial/tongue/throat swelling, SOB or lightheadedness with hypotension: Unknown Has patient had a PCN reaction causing severe rash involving mucus membranes or skin necrosis: Unknown Has patient had a PCN reaction  that required hospitalization: No Has patient had a PCN reaction occurring within the last 10 years: No If all of the above answers are "NO", then may proceed with Cephalosporin use.    Diltiazem Other (See Comments)    Rash, burning and itching   Amlodipine Itching    Purpura rash   ROS- All systems are reviewed and negative except as per the HPI above.  Physical Exam: Vitals:   08/10/22 0939  BP: (!) 140/82  Pulse: 87  Weight: 59 kg  Height: 5' 3.5" (1.613 m)    GEN- The patient is well appearing, alert and oriented x 3 today.   Neck - no JVD or carotid bruit noted Lungs- Clear to ausculation bilaterally, normal work of breathing Heart- Irregular rate and rhythm, no murmurs, rubs or gallops, PMI not laterally displaced Extremities- no clubbing, cyanosis, or edema Skin - no rash or ecchymosis noted   Wt Readings from Last 3 Encounters:  08/10/22 59 kg  05/21/22 62.6 kg  03/19/22 65.7 kg    EKG today demonstrates  Atrial flutter with variable AV block Nonspecific  intraventricular conduction delay HR 87 PR * QRS 118 ms QT/Qtc 426/512 ms  Echo 02/04/21 demonstrated  1. Left ventricular ejection fraction, by estimation, is 60 to 65%. The  left ventricle has normal function. The left ventricle has no regional  wall motion abnormalities. There is mild asymmetric left ventricular  hypertrophy of the basal-septal segment. Left ventricular diastolic parameters are indeterminate.   2. Right ventricular systolic function is normal. The right ventricular  size is normal. There is normal pulmonary artery systolic pressure. The  estimated right ventricular systolic pressure is 26.4 mmHg.   3. Left atrial size was moderately dilated.   4. The mitral valve is degenerative. Trivial mitral valve regurgitation.  Moderate to severe mitral annular calcification.   5. The aortic valve is tricuspid. There is mild calcification of the  aortic valve. There is mild thickening of the  aortic valve. Aortic valve  regurgitation is not visualized. Aortic valve sclerosis/calcification is  present, without any evidence of aortic stenosis.   6. The inferior vena cava is normal in size with greater than 50%  respiratory variability, suggesting right atrial pressure of 3 mmHg.   Comparison(s): No significant change from prior study.   Epic records are reviewed at length today  CHA2DS2-VASc Score =   4 [name] score is based upon:  Age HTN Gender      ASSESSMENT AND PLAN: 1. Persistent Atrial Fibrillation/atrial flutter [name] CHA2DS2-VASc score is  4, indicating a  4.8% annual risk of stroke.   Diltiazem discontinued due to adverse reaction (itching) S/p Afib ablation 03/23/22 at [facility].  She is in rate controlled atrial flutter. Schedule DCCV 3 weeks from yesterday. Labs today. Continue bisoprolol 5 mg daily Continue Eliquis 5 mg BID.  Informed Consent   Shared Decision Making/Informed Consent The risks (stroke, cardiac arrhythmias rarely resulting in the need for a temporary or permanent pacemaker, skin irritation or burns and complications associated with conscious sedation including aspiration, arrhythmia, respiratory failure and death), benefits (restoration of normal sinus rhythm) and alternatives of a direct current cardioversion were explained in detail to Ms. Niu and she agrees to proceed.       Going forward, if she has ERAF can consider Tikosyn.   2. Secondary Hypercoagulable State (ICD10:  D68.69) The patient is at significant risk for stroke/thromboembolism based upon her CHA2DS2-VASc Score of 4  .  Continue Apixaban (Eliquis).   3. HTN Stable, no changes today.   Follow up 1-2 weeks after DCCV.   Lake Bells, PA-C Af[facility] [address] [place], [place] [address] [phone] 08/10/2022 9:53 AM

## 2022-08-14 DIAGNOSIS — D1801 Hemangioma of skin and subcutaneous tissue: Secondary | ICD-10-CM | POA: Diagnosis not present

## 2022-08-14 DIAGNOSIS — L298 Other pruritus: Secondary | ICD-10-CM | POA: Diagnosis not present

## 2022-08-14 DIAGNOSIS — I8312 Varicose veins of left lower extremity with inflammation: Secondary | ICD-10-CM | POA: Diagnosis not present

## 2022-08-14 DIAGNOSIS — I872 Venous insufficiency (chronic) (peripheral): Secondary | ICD-10-CM | POA: Diagnosis not present

## 2022-08-14 DIAGNOSIS — L218 Other seborrheic dermatitis: Secondary | ICD-10-CM | POA: Diagnosis not present

## 2022-08-14 DIAGNOSIS — I8311 Varicose veins of right lower extremity with inflammation: Secondary | ICD-10-CM | POA: Diagnosis not present

## 2022-08-16 DIAGNOSIS — Z6823 Body mass index (BMI) 23.0-23.9, adult: Secondary | ICD-10-CM | POA: Diagnosis not present

## 2022-08-16 DIAGNOSIS — F411 Generalized anxiety disorder: Secondary | ICD-10-CM | POA: Diagnosis not present

## 2022-08-16 DIAGNOSIS — J849 Interstitial pulmonary disease, unspecified: Secondary | ICD-10-CM | POA: Diagnosis not present

## 2022-08-16 DIAGNOSIS — R42 Dizziness and giddiness: Secondary | ICD-10-CM | POA: Diagnosis not present

## 2022-08-17 ENCOUNTER — Other Ambulatory Visit (HOSPITAL_COMMUNITY): Payer: Self-pay | Admitting: *Deleted

## 2022-08-24 ENCOUNTER — Ambulatory Visit (HOSPITAL_COMMUNITY): Payer: Medicare PPO | Admitting: Internal Medicine

## 2022-08-28 ENCOUNTER — Other Ambulatory Visit (HOSPITAL_COMMUNITY): Payer: Self-pay | Admitting: *Deleted

## 2022-09-03 ENCOUNTER — Encounter: Payer: Self-pay | Admitting: Pulmonary Disease

## 2022-09-03 ENCOUNTER — Ambulatory Visit: Payer: Medicare PPO | Admitting: Pulmonary Disease

## 2022-09-03 VITALS — BP 122/66 | HR 65 | Ht 63.5 in | Wt 133.4 lb

## 2022-09-03 DIAGNOSIS — G4733 Obstructive sleep apnea (adult) (pediatric): Secondary | ICD-10-CM | POA: Diagnosis not present

## 2022-09-03 NOTE — Progress Notes (Signed)
Synopsis: Referred in April 2023 for cough by Leodis Sias, MD  Subjective:   PATIENT ID: Anna Mathews GENDER: female DOB: 1940/02/24, MRN: [id number]  HPI  Chief Complaint  Patient presents with   Follow-up    Doing well. Here to discuss Sleep study results   Anna Mathews is an 82 year old woman, former smoker with atrial fibrillation, GERD, hiatal hernia, and hypertension who returns to pulmonary clinic for organizing pneumonia.   She has been doing well since last visit. Her home sleep study showed mild sleep apnea with AHI of 12. She is amenable to starting CPAP therapy as we discussed this can add to her atrial fibrillation burden.   OV 05/21/22 She had ablation procedure done at [facility] and remained in atrial fibrillation.   PFTs show minimal obstructive airways disease and significant response to bronchodilators. FEV1/FVC ratio are within normal limits but her flow volume loop shows a degree of obstructive pattern.   She denies issues with cough, wheezing or shortness of breath. She has intermittent dizziness at rest.   OV 03/19/22 She completed prednisone on 2/10 and has been feeling well. No issues with shortness of breath, cough or wheezing.  HRCT Chest 02/23/2022 shows near resolution of inflammatory changes.  She has atrial fibrillation ablation coming up at [facility].   OV 02/07/2022 She has been taking 20mg  of prednisone daily since last visit. She reports her breathing has been stable since last visit. She met with EP at [facility] and is scheduled for an atrial fibrillation ablation in March. Plan is to stop flecainide in the future.  OV 12/07/21 She was seen by Buelah Manis, NP 09/27/21 for progressive dyspnea with HRCT chest 10/02/21 showing upper lobe prodominate irregular GGO and consolidative airspace opacities throught the lungs suggestive of developing fibrotic changes. She was taken for bronchoscopy with BAL and LUL transbronchial biopsies which showed organizing pneumonia. She  was placed on 20mg  of prednisone daily and bactrim DS prophylaxis.   She is feeling somewhat better. She reports her skin has a burning sensation. Denies any rash.  OV 08/24/21 She has completed steroid taper at end of July. She feels her breathing is back to baseline. She has no active complaints at this time.  OV 07/12/21 She continues to feel much better. She is accompanied by her friend today.   She is currently on 30mg  of prednisone daily and bactrim DS 1 tab 3 days per week.   Chest radiograph at last visit showed improved infiltrates.  OV 06/07/21 She was started on high dose prednisone taper on 05/12/21 empirically for concern of organizing pneumonia vs pneumonitis related to hydralazine exposure.   She has been on 40mg  prednisone and is feeling much better. Her fatigue has resolved and she denies cough. She has mild dyspnea but overal much improved. She has started taking bactrim DS 3 times per week for pneumocystis prophylaxis.   OV 05/08/21 She reports having shortness of breath since the beginning of the year which progressed in mid to late February. She was seeing her cardiology team and her blood pressure medications were being titrated at that time. There was concern she had drug induced rash secondary to amlodipine but it seems that the rash persisted despite switching off this medication. 1/13 for hypertensive urgency but no chest imaging performed at that time. She was given 15mg  of IV hydralazine in the ED on 02/03/21.   She has been adjusting many different antihypertensive agents with the cardiology team. She was started  on hydralazine 10mg  BID on 2/7 then called the cardiology office 2/10 with reports of redness on her arms/legs with a burning sensation. The dose was reduced then ultimately stopped on 2/13.   She continued to have progressive dyspnea in early March. She was started on lasix at that time with no improvement. A chest x-ray 03/28/21 was obtained which showed which  showed right lower lobe and left perihilar opacities concerning for pneumonia. She was treated with doxycyline without much improvement. She continued to feel weak with low energy. She was given levaquin by her primary care on 3/23 which did not help improve her symptoms. She had x-ray 3/28 which showed progressive multifocal infiltrates. She was then treated with doxycycline again with no improvement. X ray 4/4 showed improvement of opacities on right but persistent left sided opacities.   CT Chest 4/11 showed multiple ground glass opacities in both upper lobes and right lower lobe.   She continues to have dyspnea, dry cough and fatigue. She has intermittent nausea. She has lost 12-15lbs since last fall. She denies any joint pains and no other skin rashes.   She quit smoking in 1970. Denies any harmful occupational exposures. Her father had emphysema.   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   Hyperlipidemia    Hypertension    Osteopenia    Postmenopausal hormone replacement therapy 1989 - 07/2000     Family History  Problem Relation Age of Onset   Hypertension Mother    Heart disease Father    COPD Father    Diabetes Maternal Grandfather    Diabetes Son    Rheum arthritis Brother    Kidney cancer Brother    Breast cancer Maternal Grandmother    Diabetes Maternal Aunt    Diabetes Maternal Aunt      Social History   Socioeconomic History   Marital status: Widowed    Spouse name: Not on file   Number of children: 2   Years of education: Not on file   Highest education level: Not on file  Occupational History   Not on file  Tobacco Use   Smoking status: Former    Current packs/day: 0.00    Average packs/day: 0.3 packs/day for 12.0 years (3.0 ttl pk-yrs)     Types: Cigarettes    Start date: 01/16/1957    Quit date: 01/23/1968    Years since quitting: 54.6   Smokeless tobacco: Never   Tobacco comments:    Former smoker 08/04/21  Vaping Use   Vaping status: Never Used  Substance and Sexual Activity   Alcohol use: Not Currently    Comment: social   Drug use: No   Sexual activity: Not Currently    Partners: Male    Birth control/protection: Post-menopausal  Other Topics Concern   Not on file  Social History Narrative   Not on file   Social Determinants of Health   Financial Resource Strain: Low Risk  (03/24/2022)   Received from [facility], [facility]   Overall Financial Resource Strain (CARDIA)    Difficulty of Paying Living Expenses: Not hard at all  Food Insecurity: No Food Insecurity (03/24/2022)   Received from [facility], [facility]  Hunger Vital Sign    Worried About Running Out of Food in the Last Year: Never true    Ran Out of Food in the Last Year: Never true  Transportation Needs: Unknown (03/24/2022)   Received from [facility], [facility]   [facility] - Transportation    In the past 12 months, has lack of transportation kept you from medical appointments or from getting medications?: No    Lack of Transportation (Non-Medical): Not on file  Physical Activity: Not on file  Stress: Not on file  Social Connections: Not on file  Intimate Partner Violence: Not on file     Allergies  Allergen Reactions   Atorvastatin Other (See Comments)    messed with her liver   Penicillins Other (See Comments)    Itching in the eyes    Amlodipine Itching    Purpura rash   Diltiazem Itching and Rash    burning      Outpatient Medications Prior to Visit  Medication Sig Dispense Refill   bisoprolol (ZEBETA) 5 MG tablet TAKE 1 TABLET (5 MG TOTAL) BY MOUTH DAILY. 90 tablet 2   busPIRone (BUSPAR) 15 MG tablet Take 15 mg by mouth  2 (two) times daily.     ELIQUIS 5 MG TABS tablet TAKE 1 TABLET BY MOUTH TWICE A DAY 60 tablet 6   flecainide (TAMBOCOR) 150 MG tablet TAKE 1 TABLET BY MOUTH TWICE A DAY (Patient taking differently: Take 75 mg by mouth 2 (two) times daily.) 180 tablet 1   rosuvastatin (CRESTOR) 5 MG tablet Take 5 mg by mouth daily.     No facility-administered medications prior to visit.   Review of Systems  Constitutional:  Negative for chills, fever, malaise/fatigue and weight loss.  HENT:  Negative for congestion, sinus pain and sore throat.   Eyes: Negative.   Respiratory:  Negative for cough, hemoptysis, sputum production, shortness of breath and wheezing.   Cardiovascular:  Negative for chest pain, palpitations, orthopnea, claudication and leg swelling.  Gastrointestinal:  Negative for abdominal pain, heartburn, nausea and vomiting.  Genitourinary: Negative.   Musculoskeletal:  Negative for joint pain and myalgias.  Skin:  Negative for rash.  Neurological:  Negative for weakness.  Endo/Heme/Allergies: Negative.   Psychiatric/Behavioral: Negative.      Objective:   Vitals:   09/03/22 1445  BP: 122/66  Pulse: 65  SpO2: 94%  Weight: 133 lb 6.4 oz (60.5 kg)  Height: 5' 3.5" (1.613 m)    Physical Exam Constitutional:      General: She is not in acute distress.    Appearance: She is not ill-appearing.  HENT:     Head: Normocephalic and atraumatic.  Cardiovascular:     Rate and Rhythm: Normal rate and regular rhythm.     Pulses: Normal pulses.     Heart sounds: Normal heart sounds. No murmur heard. Pulmonary:     Effort: Pulmonary effort is normal.     Breath sounds: No wheezing, rhonchi or rales.  Musculoskeletal:     Right lower leg: No edema.     Left lower leg: No edema.  Skin:    General: Skin is warm and dry.  Neurological:     General: No focal deficit present.     Mental Status: She is alert.    CBC    Component Value Date/Time   WBC 5.6 08/10/2022 0955   RBC 4.63  08/10/2022 0955   HGB 13.6 08/10/2022 0955   HGB 12.7  06/07/2020 1549   HCT 43.4 08/10/2022 0955   HCT 37.1 06/07/2020 1549   PLT 203 08/10/2022 0955   PLT 222 06/07/2020 1549   MCV 93.7 08/10/2022 0955   MCV 91 06/07/2020 1549   MCH 29.4 08/10/2022 0955   MCHC 31.3 08/10/2022 0955   RDW 13.8 08/10/2022 0955   RDW 12.2 06/07/2020 1549   LYMPHSABS 1.1 12/07/2021 1357   MONOABS 0.8 12/07/2021 1357   EOSABS 0.0 12/07/2021 1357   BASOSABS 0.0 12/07/2021 1357      Latest Ref Rng & Units 08/10/2022    9:55 AM 12/07/2021    1:57 PM 10/11/2021   10:00 AM  BMP  Glucose 70 - 99 mg/dL 161  096  045   BUN 8 - 23 mg/dL 34  32  27   Creatinine 0.44 - 1.00 mg/dL 4.09  8.11  9.14   Sodium 135 - 145 mmol/L 137  141  142   Potassium 3.5 - 5.1 mmol/L 3.9  4.0  4.3   Chloride 98 - 111 mmol/L 103  103  106   CO2 22 - 32 mmol/L 24  33  27   Calcium 8.9 - 10.3 mg/dL 9.3  9.5  9.2    Chest imaging: HRCT Chest 02/23/22 1. Almost completely resolved heterogeneous and ground-glass airspace opacity seen on prior examination, with a small focus of residual heterogeneous opacity of the anterior right upper lobe and in the peripheral left lung base. Findings are consistent with resolving infection or inflammation. 2. Parke Simmers appearing scarring of the medial right lower lobe overlying prominent disc osteophytes. 3. Cardiomegaly and coronary artery disease.  CXR 12/07/21 Near complete resolution of bilateral pulmonary opacities with possible mild residual postinflammatory scarring on the left. No acute findings.  HRCT Chest 10/02/21 1. Fluctuant bilateral, upper lobe predominant irregular, somewhat geographic ground-glass and consolidative airspace opacities throughout the lungs, on today's exam with a more dense, consolidative appearance when compared to prior examination dated 05/02/2021, with some suggestion of developing fibrotic change. Unusual findings are generally nonspecific and infectious  or inflammatory, and particularly would be in keeping with reported history of drug reaction, general differential considerations with this appearance including eosinophilic pneumonia, organizing pneumonia, and atypical infection. 2. Lobular air trapping on expiratory phase imaging consistent with small airways disease. 3. Coronary artery disease.  CXR 06/07/21 Resolution of prior mixed interstitial and airspace disease, compatible with resolved pneumonia, with background of chronic changes.  CT Chest 05/02/21 Mediastinum/Nodes: 2.4 cm left thyroid nodule is noted. Esophagus is unremarkable. Mildly enlarged precarinal adenopathy is noted which most likely is reactive or inflammatory in etiology.   Lungs/Pleura: No pneumothorax or pleural effusion is noted. Multiple ground-glass opacities are noted in both upper lobes and to some degree the right lower lobe, which may represent multifocal inflammation or sequela of prior infection. Bilateral lower lobe opacities are noted, right greater than left, concerning for pneumonia.  PFT:    Latest Ref Rng & Units 05/21/2022    8:35 AM  PFT Results  FVC-Pre L 1.70   FVC-Predicted Pre % 70   FVC-Post L 1.80   FVC-Predicted Post % 74   Pre FEV1/FVC % % 76   Post FEV1/FCV % % 82   FEV1-Pre L 1.30   FEV1-Predicted Pre % 72   FEV1-Post L 1.47   DLCO uncorrected ml/min/mmHg 21.93   DLCO UNC% % 119   DLCO corrected ml/min/mmHg 23.07   DLCO COR %Predicted % 126   DLVA Predicted % 178  TLC L 3.98   TLC % Predicted % 80   RV % Predicted % 92   PFTs show mild obstruction based on flow volume loop with significant bronchodilator response  Labs:  Path:  Echo 02/04/21: LV EF 60-65%. RV size and systolic function is normal. LA size is moderately dilated.   Heart Catheterization:  Assessment & Plan:   OSA (obstructive sleep apnea) - Plan: AMB REFERRAL FOR DME  Discussion: Anna Mathews is an 82 year old woman, former smoker with  atrial fibrillation, GERD, hiatal hernia, and hypertension who returns to pulmonary clinic for organizing pneumonia.   She was treated with high dose prednisone taper for suspected drug induced pneumonitis vs organizing pneumonia related to hydralazine exposure vs flecainide use.  She started prednisone 40mg  daily on 05/12/21 and completed slow taper on 08/19/21. Her respiratory status had returned to baseline but then dyspnea and cough returned and HRCT chest 10/02/21 showed fluctuant upper lobe interstitial opacities. She was taken for bronchoscopy on 10/11/21 with transbronchial biopsies confirming organizing pneumonia. She was placed on 20mg  daily with bactrim DS 3 days per week 10/17/21 and tapered off steroids 03/03/22. HRCT Chest 02/23/22 shows near resolution of ground glass opacities.  She has been doing well since last visit. PFTs show mild obstruction based on flow volume loop pattern. She is to use albuterol inhaler as needed.  Home sleep study shows mild obstructive sleep apnea with AHI of 12. We will order CPAP machine with humidification, auto-titrating 5-15cmH2O and full face mask fitting session.  She is to schedule CPAP compliance visit 1 month after she starts therapy.  Follow up in 6 months  Melody Comas, MD [facility] Office: [phone]   Current Outpatient Medications:    bisoprolol (ZEBETA) 5 MG tablet, TAKE 1 TABLET (5 MG TOTAL) BY MOUTH DAILY., Disp: 90 tablet, Rfl: 2   busPIRone (BUSPAR) 15 MG tablet, Take 15 mg by mouth 2 (two) times daily., Disp: , Rfl:    ELIQUIS 5 MG TABS tablet, TAKE 1 TABLET BY MOUTH TWICE A DAY, Disp: 60 tablet, Rfl: 6   flecainide (TAMBOCOR) 150 MG tablet, TAKE 1 TABLET BY MOUTH TWICE A DAY (Patient taking differently: Take 75 mg by mouth 2 (two) times daily.), Disp: 180 tablet, Rfl: 1   rosuvastatin (CRESTOR) 5 MG tablet, Take 5 mg by mouth daily., Disp: , Rfl:    acetaminophen (TYLENOL) 500 MG tablet, Take 500 mg by  mouth every 6 (six) hours as needed for headache or moderate pain., Disp: , Rfl:    b complex vitamins capsule, Take 1 capsule by mouth daily., Disp: , Rfl:    Calcium Citrate (CITRACAL PO), Take 2 tablets by mouth 2 (two) times daily., Disp: , Rfl:    Clobetasol Propionate 0.05 % shampoo, Apply 1 Application topically daily as needed (itching)., Disp: , Rfl:    escitalopram (LEXAPRO) 5 MG tablet, Take 5 mg by mouth daily., Disp: , Rfl:    loperamide (IMODIUM) 1 MG/5ML solution, Take 1 mg by mouth as needed for diarrhea or loose stools., Disp: , Rfl:    OVER THE COUNTER MEDICATION, Take 1 tablet by mouth 2 (two) times daily. MagMind supplement, Disp: , Rfl:    Polyethyl Glycol-Propyl Glycol (LUBRICATING EYE DROPS OP), Place 1 drop into both eyes daily as needed (dry eyes)., Disp: , Rfl:

## 2022-09-03 NOTE — Patient Instructions (Addendum)
You have mild sleep apnea.   We will order you a CPAP machine with humidification, auto-titrating 5-15cmH2O and full face mask fitting session  Message our office once you start using your CPAP machine to schedule a compliance visit 1 month after starting therapy  Follow up in 6 months

## 2022-09-05 DIAGNOSIS — Z853 Personal history of malignant neoplasm of breast: Secondary | ICD-10-CM | POA: Diagnosis not present

## 2022-09-05 DIAGNOSIS — N63 Unspecified lump in unspecified breast: Secondary | ICD-10-CM | POA: Diagnosis not present

## 2022-09-05 DIAGNOSIS — N644 Mastodynia: Secondary | ICD-10-CM | POA: Diagnosis not present

## 2022-09-05 DIAGNOSIS — R92321 Mammographic fibroglandular density, right breast: Secondary | ICD-10-CM | POA: Diagnosis not present

## 2022-09-06 ENCOUNTER — Other Ambulatory Visit: Payer: Self-pay

## 2022-09-06 DIAGNOSIS — D241 Benign neoplasm of right breast: Secondary | ICD-10-CM | POA: Diagnosis not present

## 2022-09-06 DIAGNOSIS — R928 Other abnormal and inconclusive findings on diagnostic imaging of breast: Secondary | ICD-10-CM | POA: Diagnosis not present

## 2022-09-06 DIAGNOSIS — N6031 Fibrosclerosis of right breast: Secondary | ICD-10-CM | POA: Diagnosis not present

## 2022-09-07 ENCOUNTER — Ambulatory Visit (HOSPITAL_COMMUNITY): Payer: Medicare PPO | Admitting: Internal Medicine

## 2022-09-09 ENCOUNTER — Encounter: Payer: Self-pay | Admitting: Pulmonary Disease

## 2022-09-10 ENCOUNTER — Other Ambulatory Visit (HOSPITAL_COMMUNITY): Payer: Self-pay | Admitting: *Deleted

## 2022-09-12 DIAGNOSIS — H2512 Age-related nuclear cataract, left eye: Secondary | ICD-10-CM | POA: Diagnosis not present

## 2022-09-14 ENCOUNTER — Ambulatory Visit (HOSPITAL_COMMUNITY): Payer: Medicare PPO | Admitting: Internal Medicine

## 2022-09-24 IMAGING — US US THYROID
1 series · 13 of 25 positions shown · non-contrast
Comparison: None.

CLINICAL DATA: left lobe>right

EXAM:
THYROID ULTRASOUND
TECHNIQUE: Ultrasound examination of the thyroid gland and adjacent soft
tissues was performed.

[Series 1: us thyroid · 0.06mm/px · 13 of 46 slices shown]
[im 1/46]
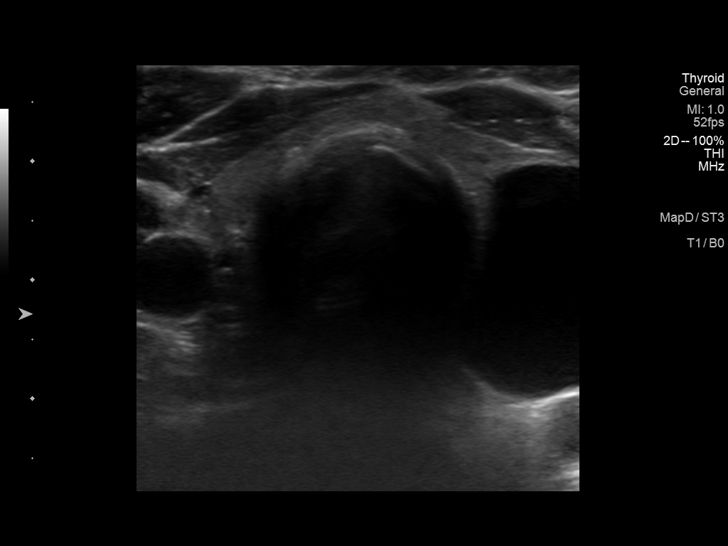
[im 4/46]
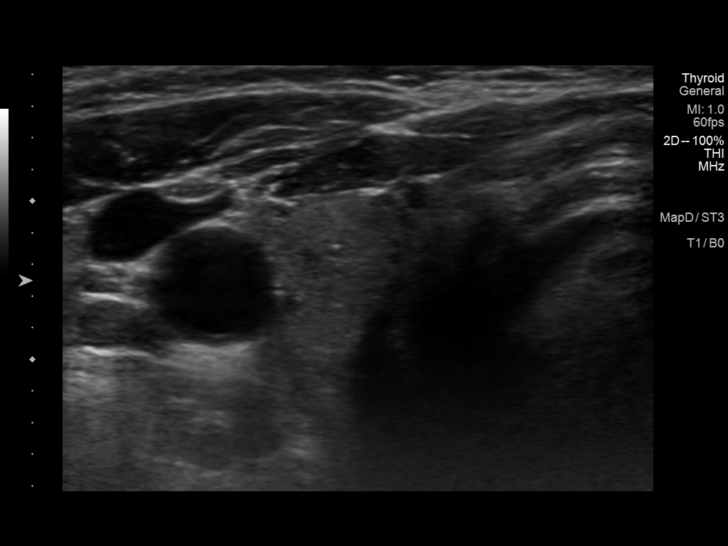
[im 8/46]
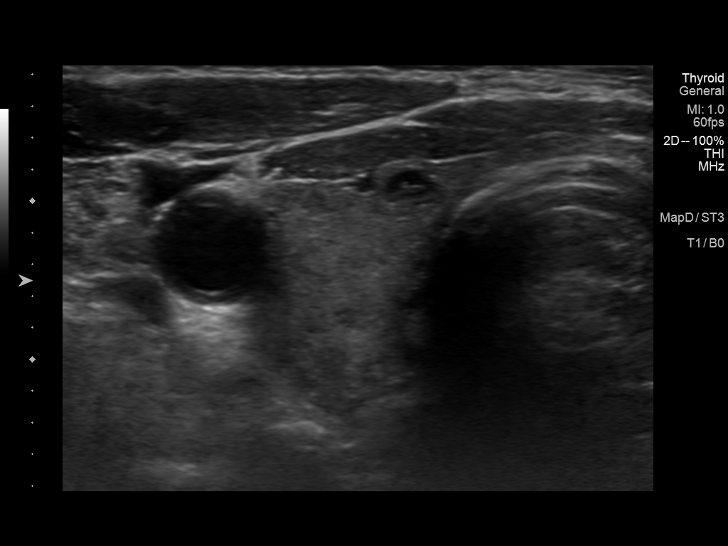
[im 12/46]
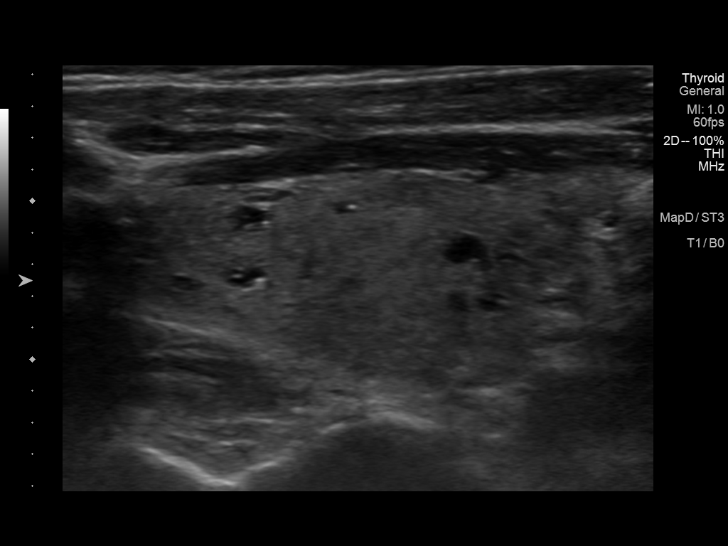
[im 16/46]
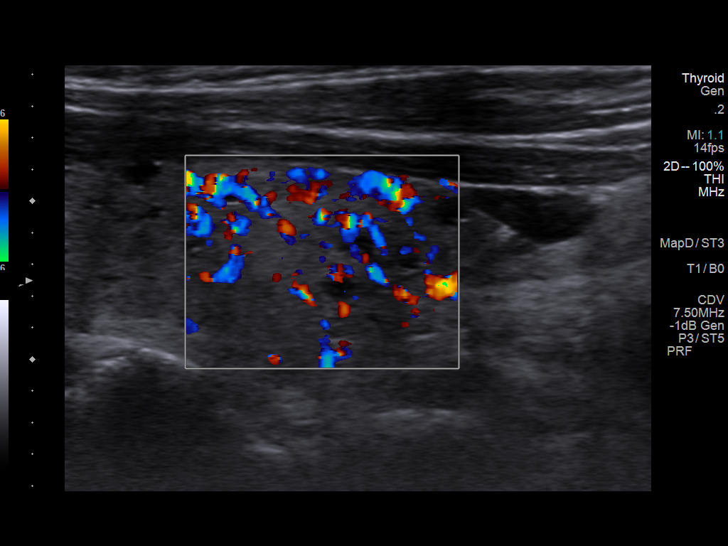
[im 19/46]
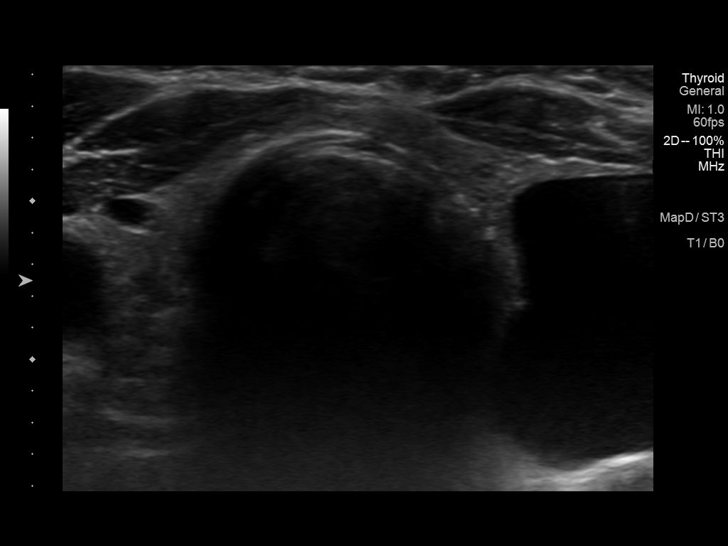
[im 23/46]
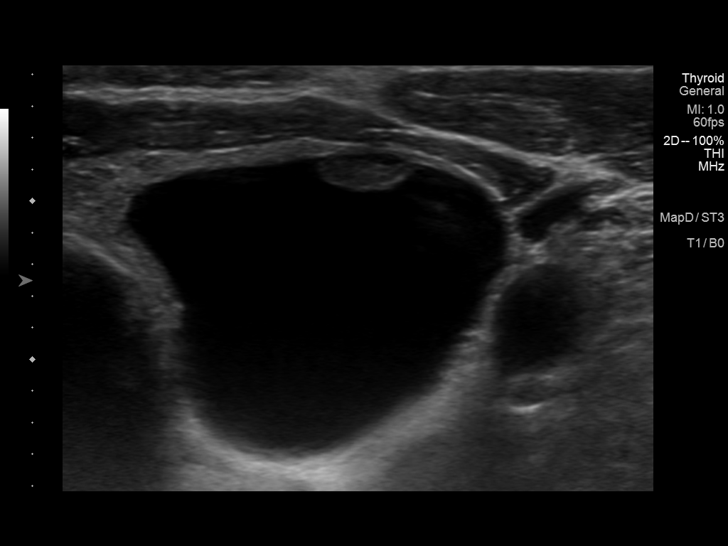
[im 27/46]
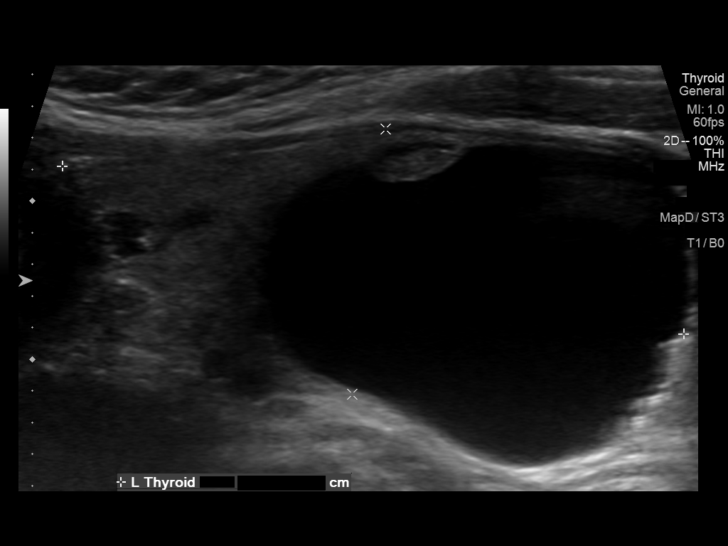
[im 31/46]
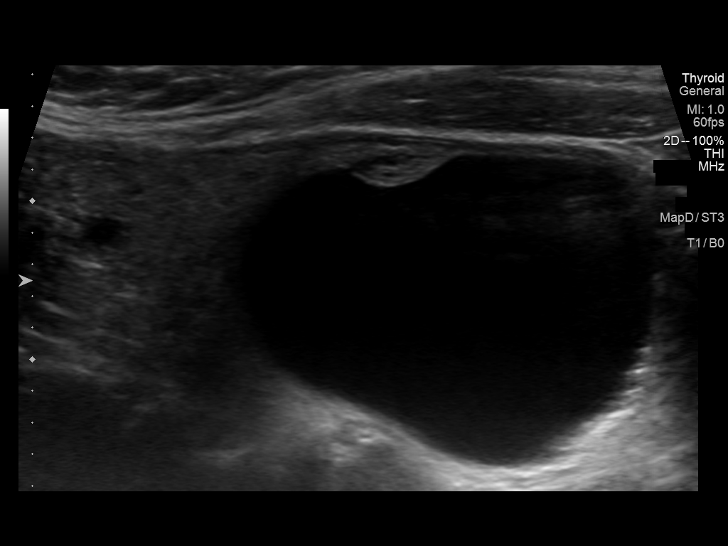
[im 34/46]
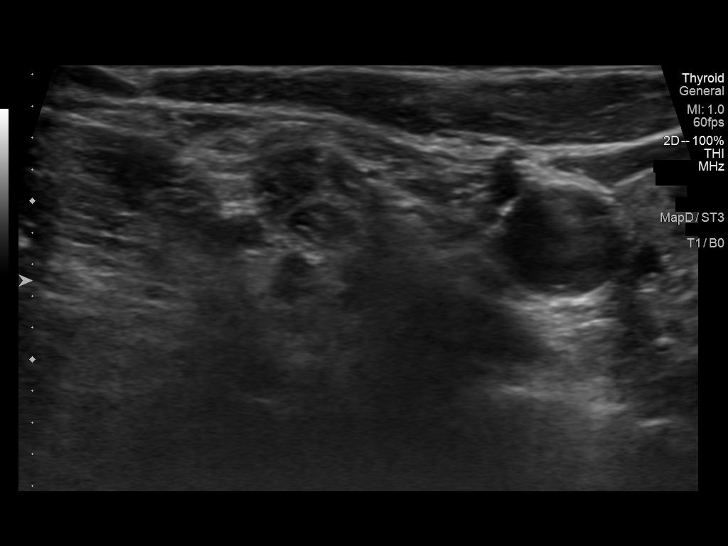
[im 38/46]
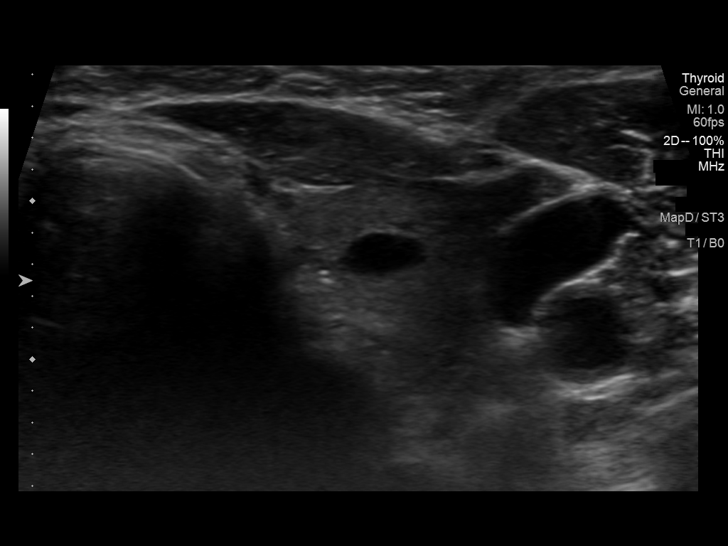
[im 42/46]
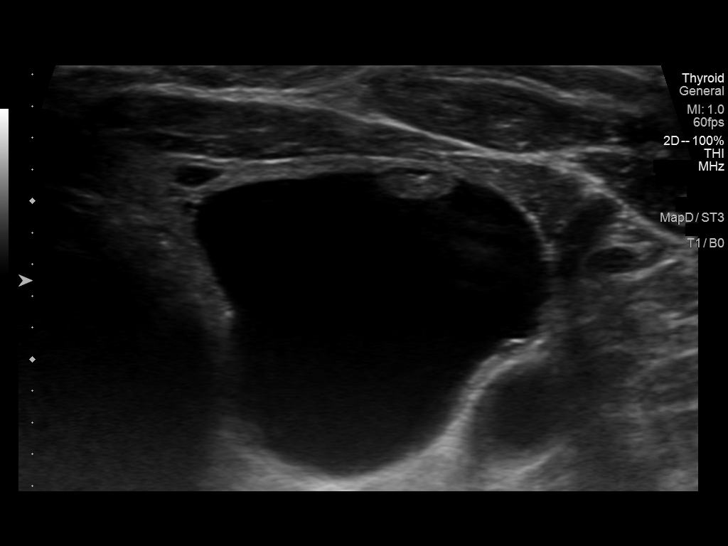
[im 46/46]
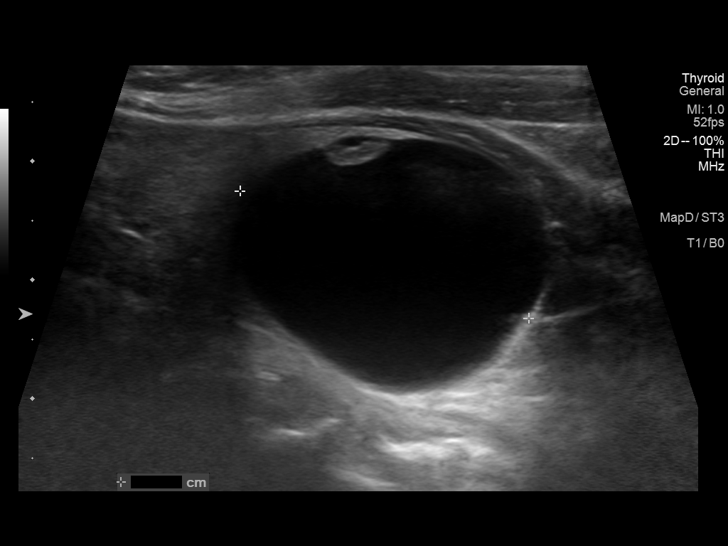

[13 of 25 positions shown; findings below may reference images not displayed]

FINDINGS: Parenchymal Echotexture: Mildly heterogenous

Isthmus: 0.3 cm

Right lobe: 3.6 x 1.4 x 0.9 cm

Left lobe: 4.1 x 1.7 x 1.8 cm

_________________________________________________________

Estimated total number of nodules >/= 1 cm: 1

Number of spongiform nodules >/=  2 cm not described below (TR1): 0

Number of mixed cystic and solid nodules >/= 1.5 cm not described
below (TR2): 0

_________________________________________________________

Nodule labeled 2 in the left thyroid lobe is a large cystic/nearly
completely cystic nodule that measures 2.7 x 2.1 x 2.0 cm. This
nodule does NOT meet TI-RADS criteria for biopsy or dedicated
follow-up.

Additional small subcentimeter cystic nodules are seen scattered
throughout the thyroid gland, none of which meet criteria for
further dedicated follow-up or biopsy.
IMPRESSION: Normal size of the thyroid gland with a number of cystic nodules,
most of which are small and none of which meet criteria for
dedicated follow-up or biopsy. Of note, the largest of these cystic
nodules measures 2.7 cm in the left thyroid lobe, which can be
aspirated if symptomatic.

The above is in keeping with the ACR TI-RADS recommendations - [HOSPITAL] 5319;[DATE].

## 2022-09-24 IMAGING — DX DG CHEST 2V
2 series · 2 of 2 positions shown · non-contrast
Comparison: 10/27/2007

CLINICAL DATA: Shortness of breath

EXAM:
CHEST - 2 VIEW

[dg chest 2 view (1 of 2)]
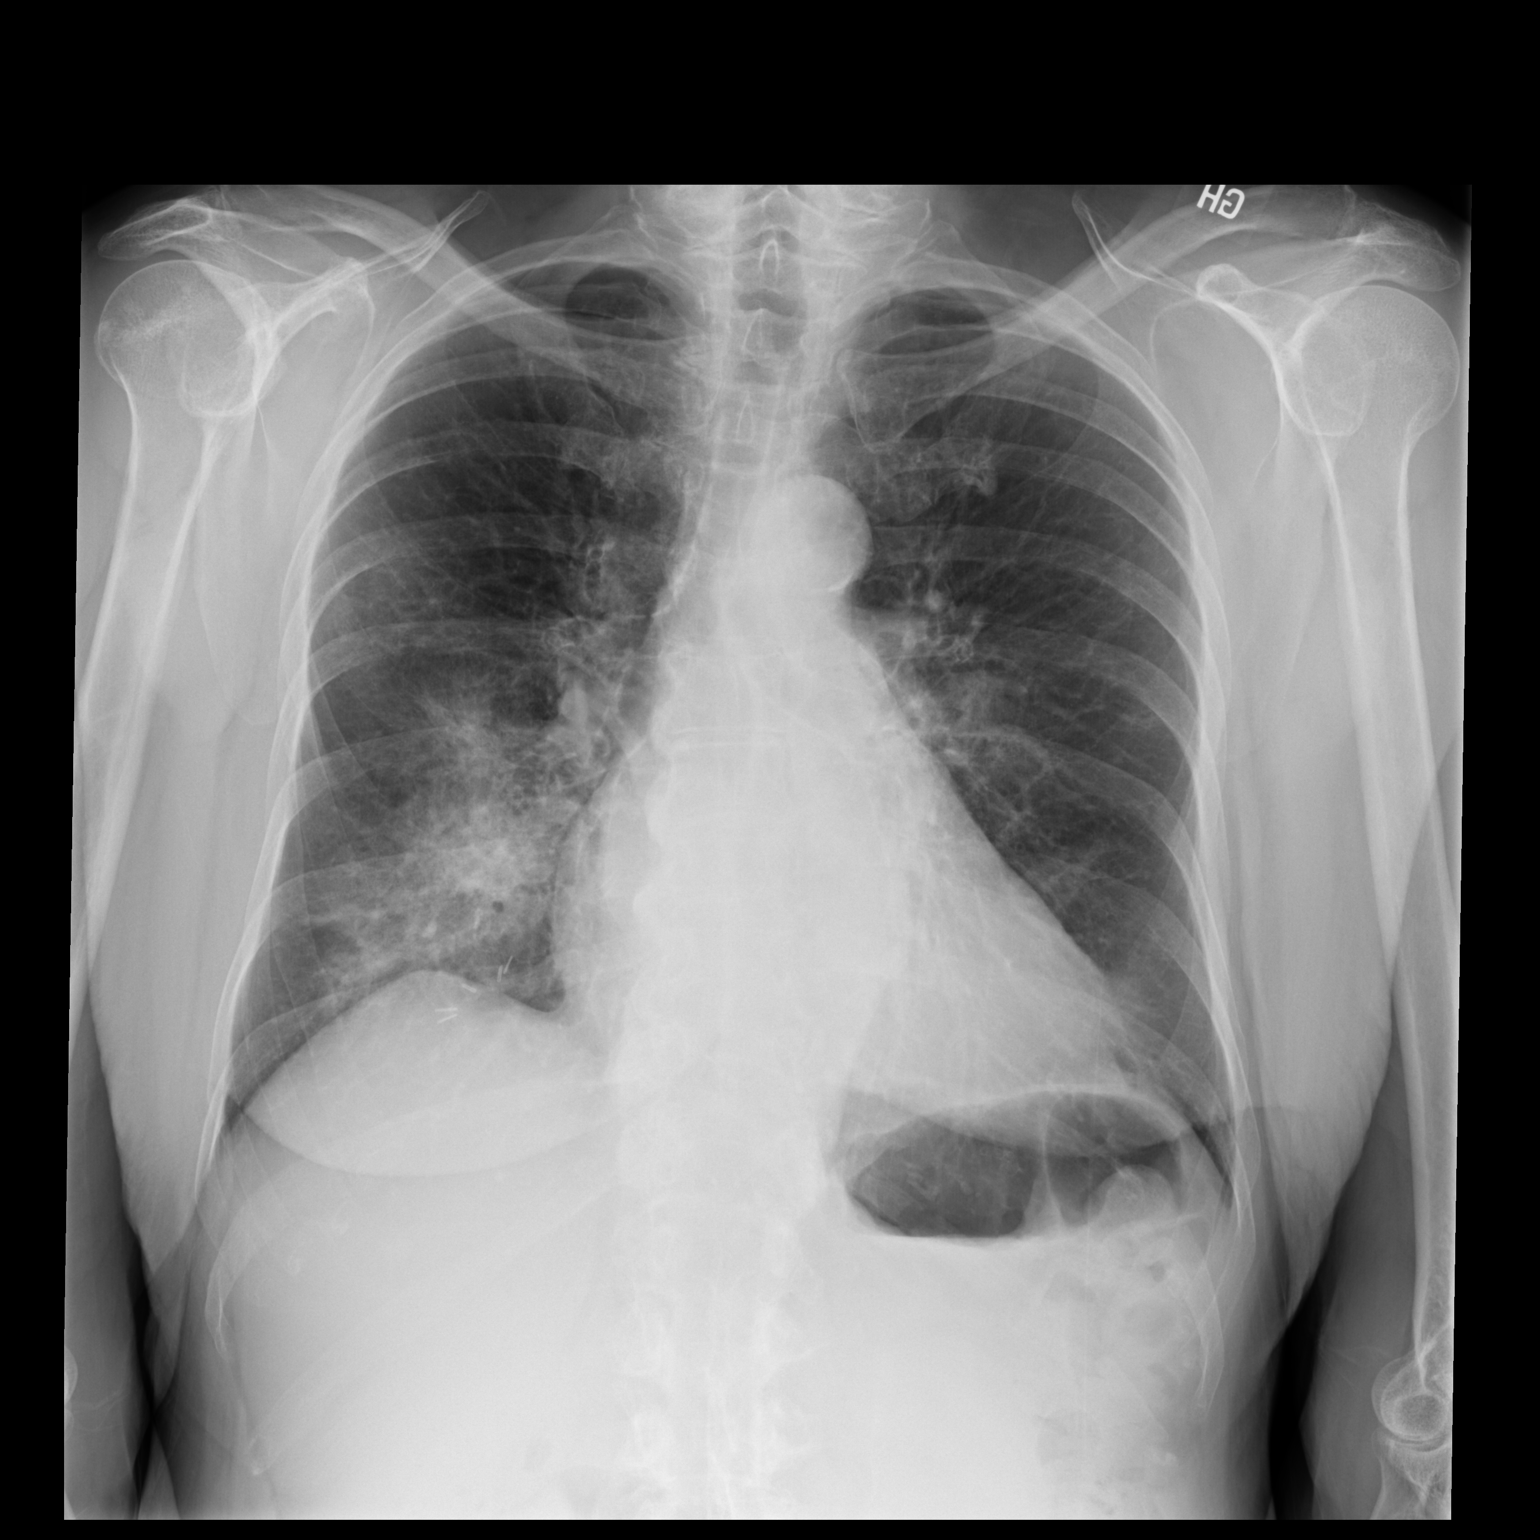

[dg chest 2 view (2 of 2)]
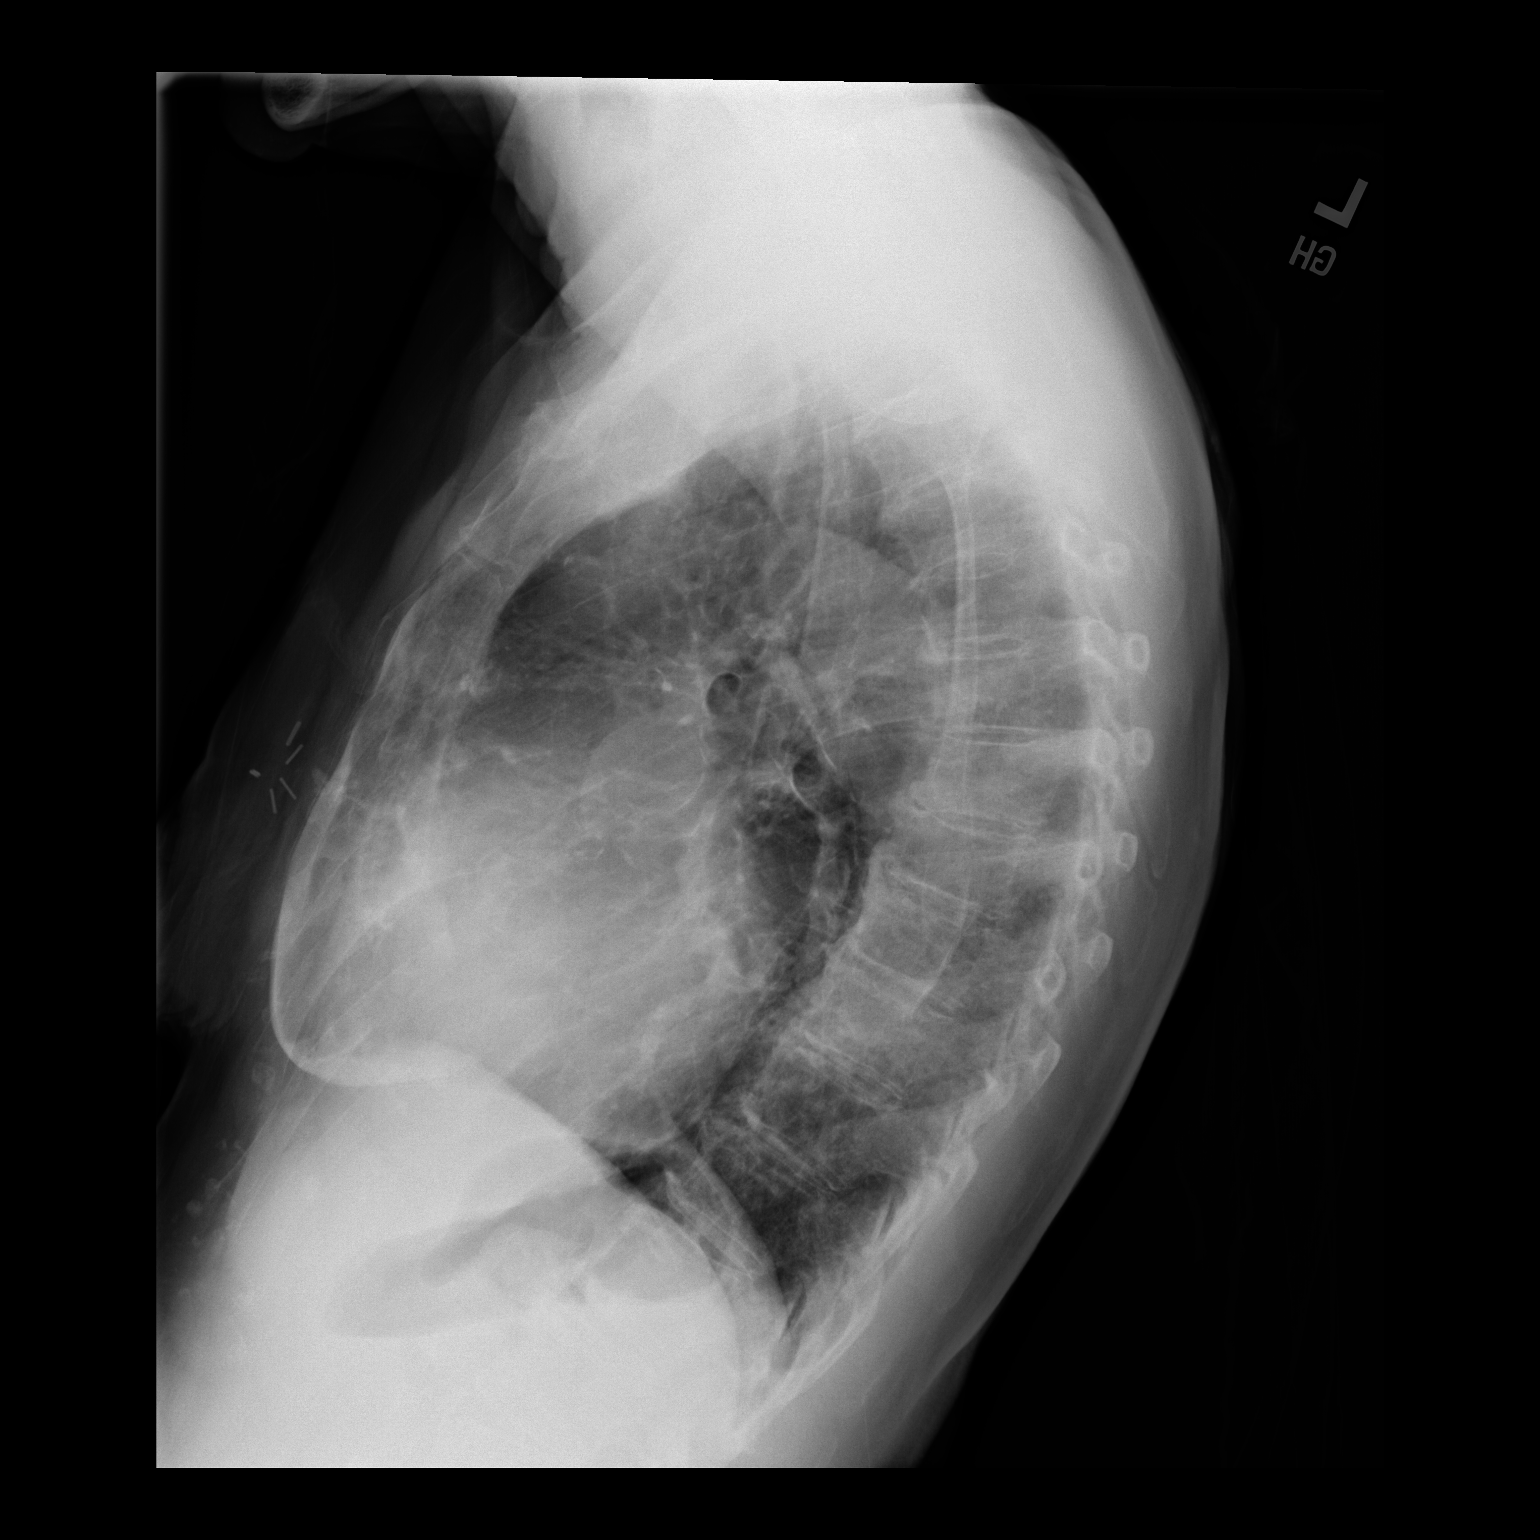

[2 of 2 positions shown; findings below may reference images not displayed]

FINDINGS: Consolidation in the right lower lobe and left perihilar region
concerning for pneumonia. Heart is normal size. No effusions or
pneumothorax. No acute bony abnormality. Mediastinal contours within
normal limits.
IMPRESSION: Right lower lobe and left perihilar consolidation concerning for
pneumonia.

## 2022-10-15 IMAGING — CR DG CHEST 2V
2 series · 2 of 2 positions shown · non-contrast
Comparison: 03/28/2021, 10/27/2007

CLINICAL DATA: 80-year-old female with a history of pneumonia and
persisting symptoms

EXAM:
CHEST - 2 VIEW

[w chest pa]
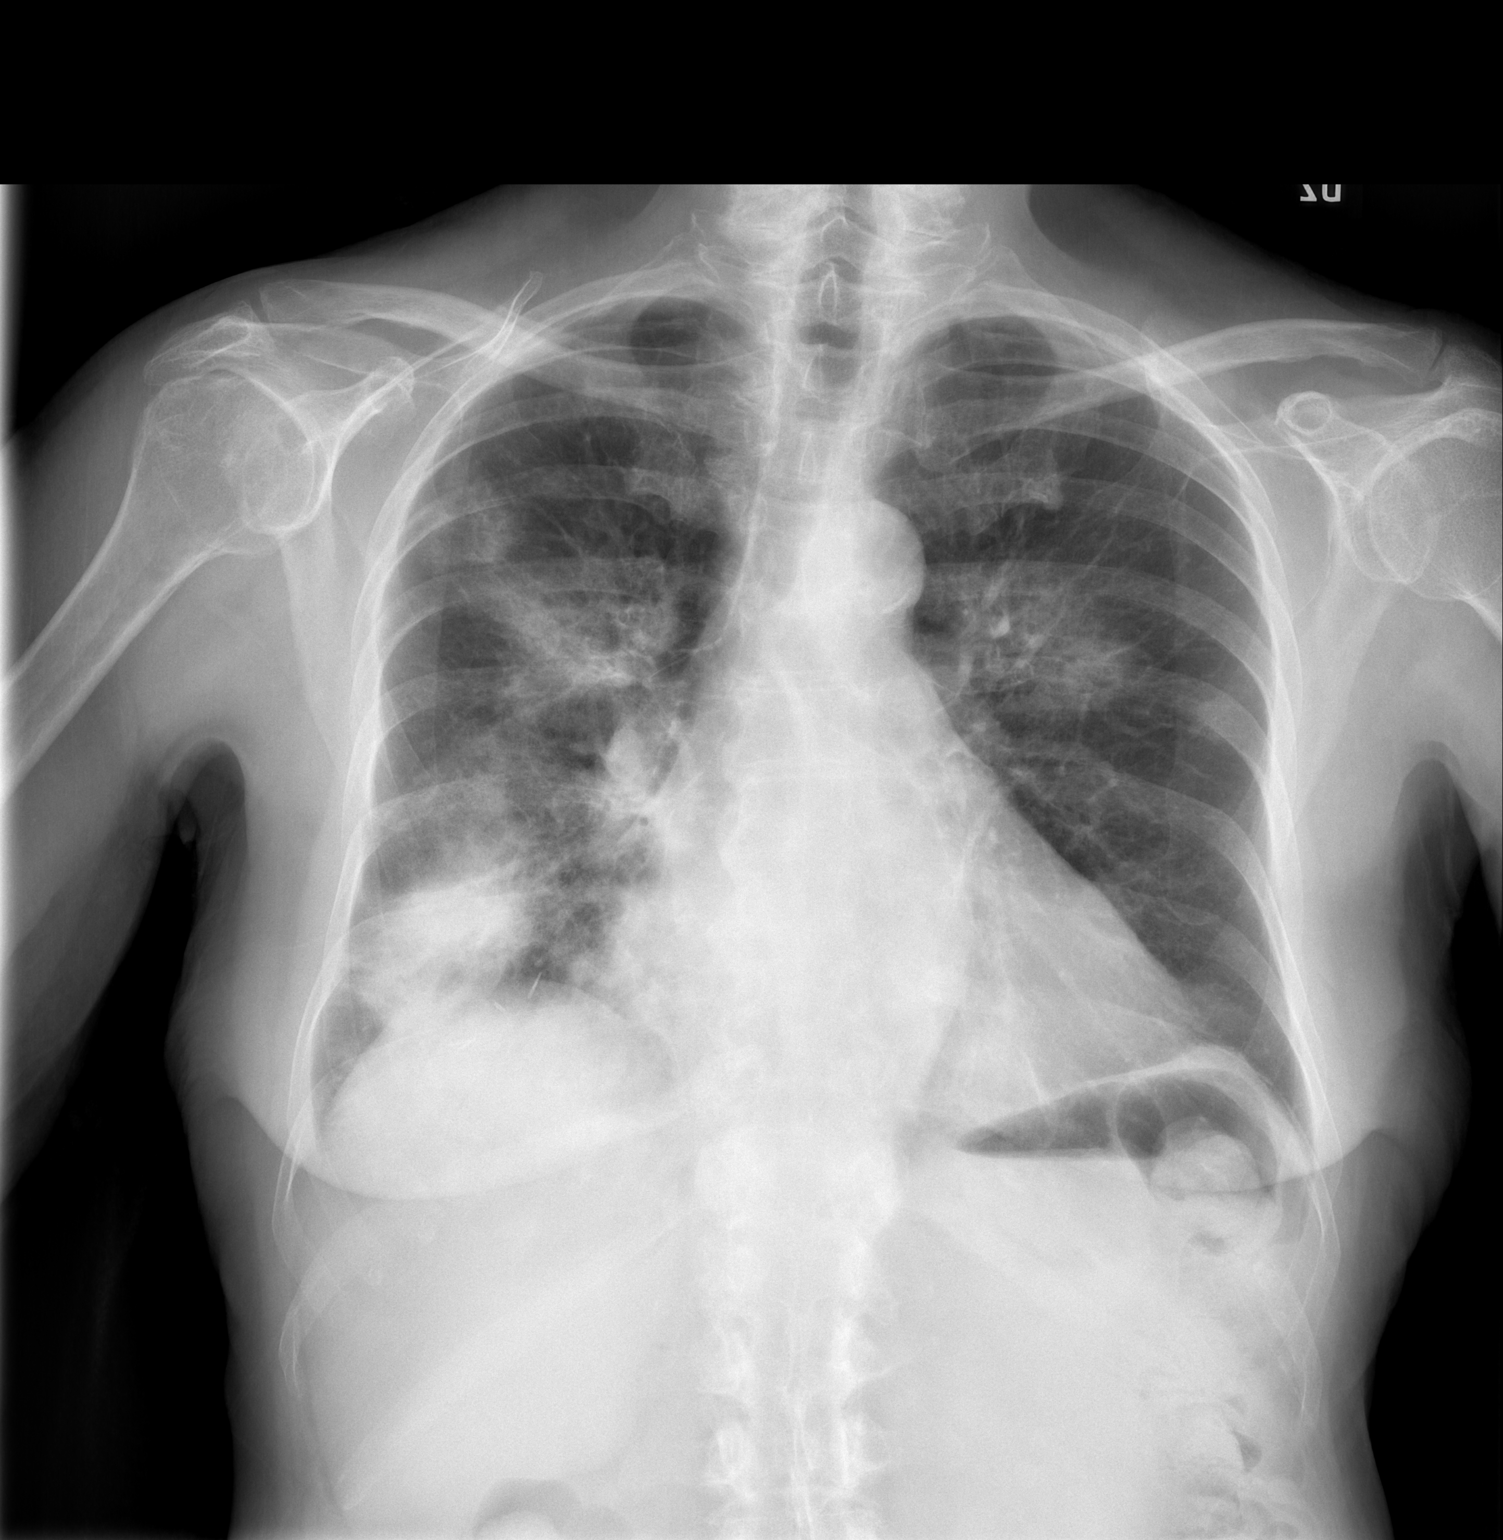

[w chest lat]
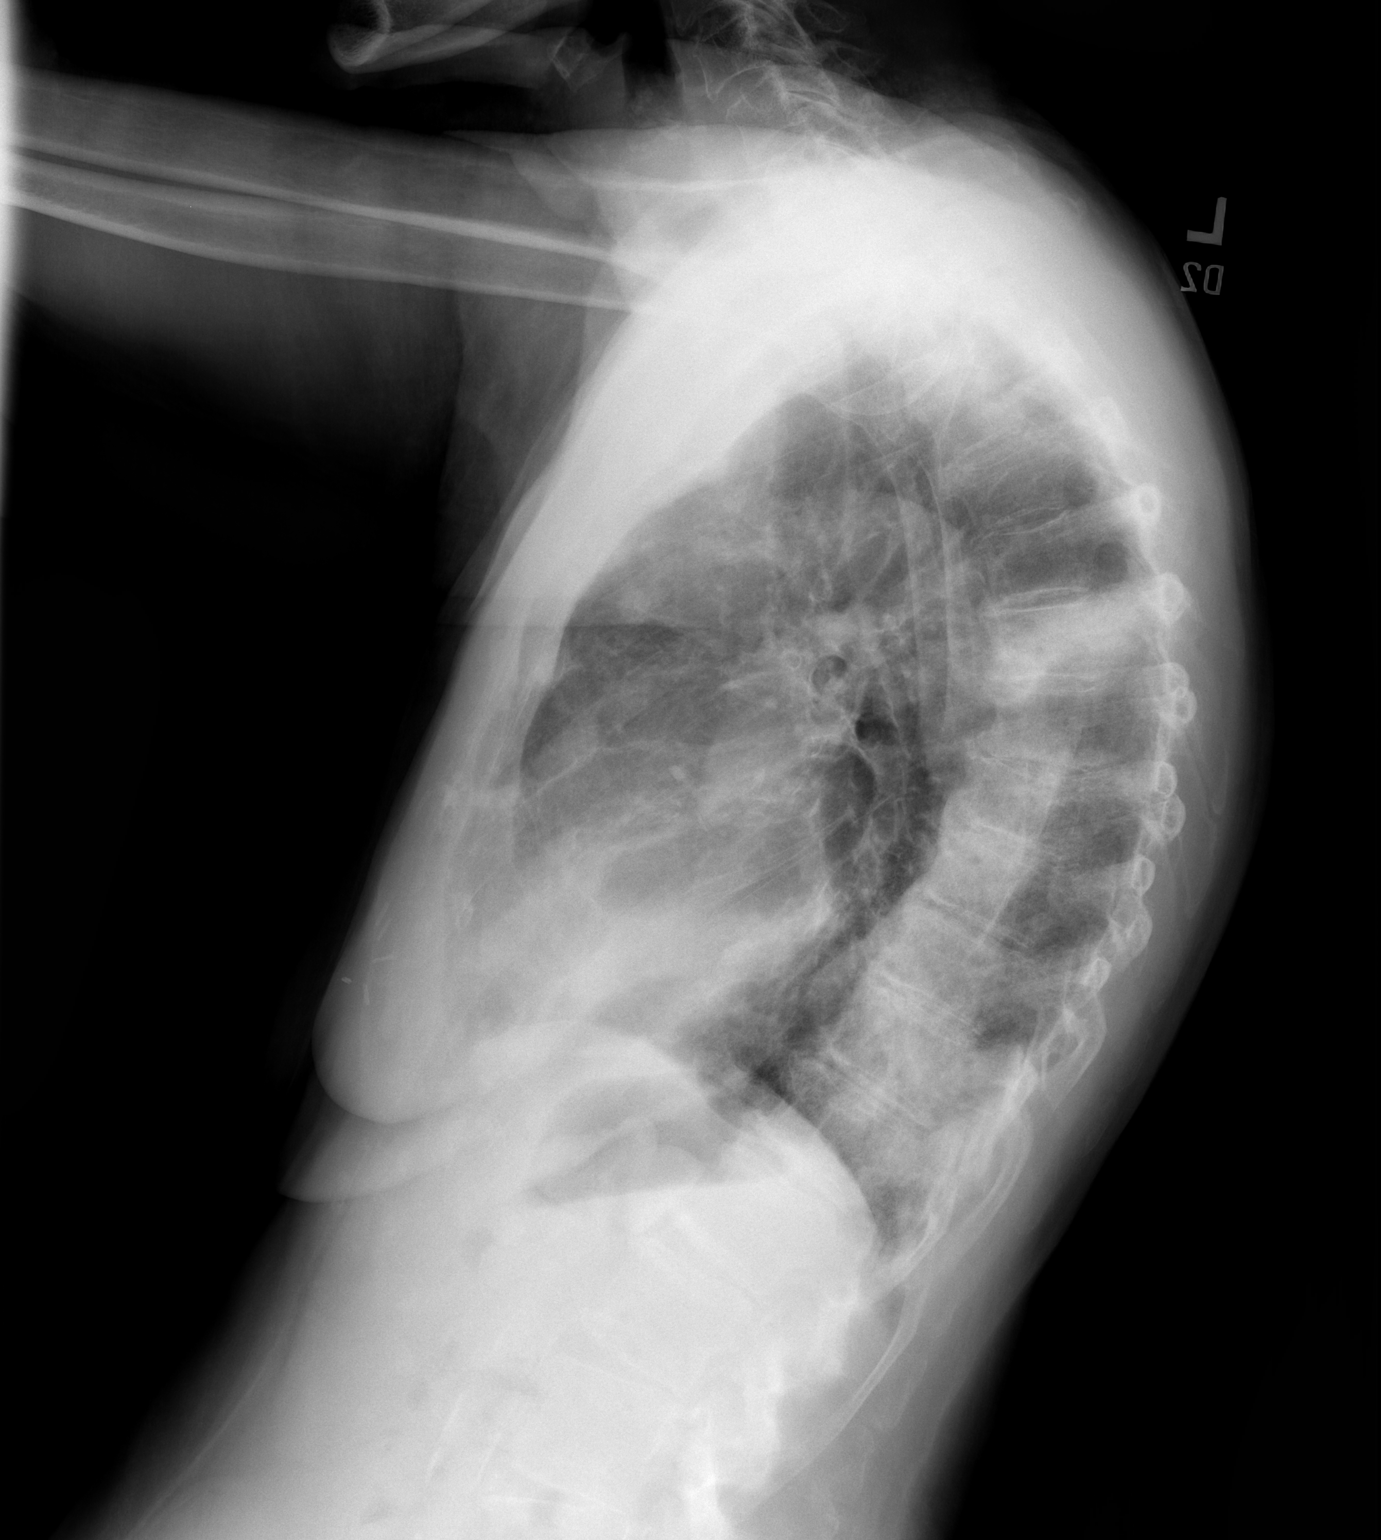

[2 of 2 positions shown; findings below may reference images not displayed]

FINDINGS: Cardiomediastinal silhouette unchanged in size and contour.

Persisting interstitial and airspace opacity in the right lower
lung, with increased density compared to the prior plain film. New
airspace opacity in the right upper lung and within the left
suprahilar region. No pneumothorax. No pleural effusion.

Degenerative changes spine.  No displaced fracture.
IMPRESSION: Progressing multifocal airspace disease, most likely worsening
pneumonia given the time frame. Followup PA and lateral chest X-ray
is recommended in 3-4 weeks following therapy to assure resolution.
Alternatively, chest CT may be useful

## 2022-10-18 ENCOUNTER — Encounter (HOSPITAL_COMMUNITY): Payer: Self-pay | Admitting: Physician Assistant

## 2022-10-18 ENCOUNTER — Ambulatory Visit (HOSPITAL_COMMUNITY)
Admission: RE | Admit: 2022-10-18 | Discharge: 2022-10-18 | Disposition: A | Discharge status: Home / Self Care | Payer: Medicare PPO | Source: Ambulatory Visit | Attending: Physician Assistant | Admitting: Physician Assistant

## 2022-10-18 VITALS — BP 108/74 | HR 59 | Ht 63.5 in | Wt 136.8 lb

## 2022-10-18 DIAGNOSIS — I4892 Unspecified atrial flutter: Secondary | ICD-10-CM

## 2022-10-18 DIAGNOSIS — G4733 Obstructive sleep apnea (adult) (pediatric): Secondary | ICD-10-CM | POA: Insufficient documentation

## 2022-10-18 DIAGNOSIS — D6869 Other thrombophilia: Secondary | ICD-10-CM | POA: Insufficient documentation

## 2022-10-18 DIAGNOSIS — I10 Essential (primary) hypertension: Secondary | ICD-10-CM | POA: Diagnosis not present

## 2022-10-18 DIAGNOSIS — R9431 Abnormal electrocardiogram [ECG] [EKG]: Secondary | ICD-10-CM | POA: Diagnosis not present

## 2022-10-18 DIAGNOSIS — I4819 Other persistent atrial fibrillation: Secondary | ICD-10-CM

## 2022-10-18 LAB — BASIC METABOLIC PANEL
Anion gap: 9 (ref 5–15)
BUN: 30 mg/dL — ABNORMAL HIGH (ref 8–23)
CO2: 24 mmol/L (ref 22–32)
Calcium: 8.9 mg/dL (ref 8.9–10.3)
Chloride: 105 mmol/L (ref 98–111)
Creatinine, Ser: 1.04 mg/dL — ABNORMAL HIGH (ref 0.44–1.00)
GFR, Estimated: 53 mL/min — ABNORMAL LOW (ref >60)
Glucose, Bld: 147 mg/dL — ABNORMAL HIGH (ref 70–99)
Potassium: 4.3 mmol/L (ref 3.5–5.1)
Sodium: 138 mmol/L (ref 135–145)

## 2022-10-18 LAB — CBC
HCT: 42.8 % (ref 36.0–46.0)
Hemoglobin: 13.7 g/dL (ref 12.0–15.0)
MCH: 30.7 pg (ref 26.0–34.0)
MCHC: 32 g/dL (ref 30.0–36.0)
MCV: 96 fL (ref 80.0–100.0)
Platelets: 205 10*3/uL (ref 150–400)
RBC: 4.46 MIL/uL (ref 3.87–5.11)
RDW: 13 % (ref 11.5–15.5)
WBC: 7.4 10*3/uL (ref 4.0–10.5)
nRBC: 0 % (ref 0.0–0.2)

## 2022-10-18 NOTE — Patient Instructions (Addendum)
Cardioversion scheduled for: Monday, September 30th   - Arrive at the [facility] and go to admitting at [facility] not eat or drink anything after midnight the night prior to your procedure.   - Take all your morning medication (except diabetic medications) with a sip of water prior to arrival.  - You will not be able to drive home after your procedure.    - Do NOT miss any doses of your blood thinner - if you should miss a dose please notify our office immediately.   - If you feel as if you go back into normal rhythm prior to scheduled cardioversion, please notify our office immediately.   If your procedure is canceled in the cardioversion suite you will be charged a cancellation fee.

## 2022-10-18 NOTE — Progress Notes (Signed)
Primary Care Physician: [facility] [place] Primary Cardiologist: Dr Eldridge Dace Primary Electrophysiologist: Dr Elberta Fortis Referring Physician: Otilio Saber PA   Anna Mathews is a 82 y.o. female with a history of HTN, HLD, ILD, atrial flutter, atrial fibrillation who presents for follow up in the [facility]. Patient is on Eliquis for a CHADS2VASC score of 4. She has been maintained on flecainide for rhythm control. She was seen 07/13/21 and found to be in atrial flutter. She presented for DCCV on 07/26/21 but had converted back to SR so the procedure was cancelled. Prior to the onset of her flutter, she had pneumonia and was on a steroid taper. Patient developed itching all over her body after starting diltiazem. This was discontinued and she was started on bisoprolol.   Seen by [facility] EP on 07/27/22 and appeared to be in atrial flutter per notes. She was set up for DCCV but had to rescheduled due to missed doses of anticoagulation and travel.  On follow up today, patient remains in rate controlled atrial flutter. She is mostly cardiac unaware but does get dizzy at times. She denies any missed doses of anticoagulation in the past 3 weeks.   Today, she denies symptoms of palpitations, chest pain, orthopnea, PND, presyncope, syncope, snoring, daytime somnolence, bleeding, or neurologic sequela. The patient is tolerating medications without difficulties and is otherwise without complaint today.    Atrial Fibrillation Risk Factors:  she does not have symptoms or diagnosis of sleep apnea. she does not have a history of rheumatic fever.   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     alendronate (FOSAMAX) 70 MG tablet Take 70 mg by mouth once a week.     b complex vitamins capsule Take 1 capsule by mouth daily.     bisoprolol (ZEBETA) 5 MG tablet TAKE 1 TABLET (5 MG TOTAL) BY MOUTH DAILY. 90 tablet 2   busPIRone (BUSPAR) 15 MG tablet Take 15 mg by mouth 2 (two) times daily.     Calcium Citrate (CITRACAL PO) Take 2 tablets by mouth 2 (two) times daily.     Clobetasol Propionate 0.05 % shampoo Apply 1 Application topically daily as needed (itching).     ELIQUIS 5 MG TABS tablet TAKE 1 TABLET BY MOUTH TWICE A DAY 60 tablet 6   escitalopram (LEXAPRO) 5 MG tablet Take 5 mg by mouth daily.     flecainide (TAMBOCOR) 150 MG tablet TAKE 1 TABLET BY MOUTH TWICE A DAY (Patient taking differently: Take 75 mg by mouth 2 (two) times daily.) 180 tablet 1   loperamide (IMODIUM) 1 MG/5ML solution Take 1 mg by mouth as needed for diarrhea or loose stools.     OVER THE COUNTER MEDICATION Take 1 tablet by  mouth 2 (two) times daily. MagMind supplement     Polyethyl Glycol-Propyl Glycol (LUBRICATING EYE DROPS OP) Place 1 drop into both eyes daily as needed (dry eyes).     rosuvastatin (CRESTOR) 5 MG tablet Take 5 mg by mouth daily.     No current facility-administered medications for this encounter.    Allergies  Allergen Reactions   Atorvastatin Other (See Comments)    messed with her liver   Penicillins Other (See Comments)    Itching in the eyes    Amlodipine Itching    Purpura rash   Diltiazem Itching and Rash    burning    ROS- All systems  are reviewed and negative except as per the HPI above.  Physical Exam: Vitals:   10/18/22 1329  BP: 108/74  Pulse: (!) 59  Weight: 62.1 kg  Height: 5' 3.5" (1.613 m)    GEN: Well nourished, well developed in no acute distress NECK: No JVD; No carotid bruits CARDIAC: Irregularly irregular rate and rhythm, no murmurs, rubs, gallops RESPIRATORY:  Clear to auscultation without rales, wheezing or rhonchi  ABDOMEN: Soft, non-tender, non-distended EXTREMITIES:  No edema; No deformity    Wt Readings from Last 3 Encounters:  10/18/22 62.1 kg  09/03/22 60.5 kg  08/10/22 59 kg    EKG today demonstrates  Atrial flutter with variable block Vent. rate 59 BPM PR interval * ms QRS duration 110 ms QT/QTcB 422/417 ms   Echo 02/04/21 demonstrated  1. Left ventricular ejection fraction, by estimation, is 60 to 65%. The  left ventricle has normal function. The left ventricle has no regional  wall motion abnormalities. There is mild asymmetric left ventricular  hypertrophy of the basal-septal segment. Left ventricular diastolic parameters are indeterminate.   2. Right ventricular systolic function is normal. The right ventricular  size is normal. There is normal pulmonary artery systolic pressure. The  estimated right ventricular systolic pressure is 26.4 mmHg.   3. Left atrial size was moderately dilated.   4. The mitral valve is degenerative. Trivial mitral valve regurgitation.  Moderate to severe mitral annular calcification.   5. The aortic valve is tricuspid. There is mild calcification of the  aortic valve. There is mild thickening of the aortic valve. Aortic valve  regurgitation is not visualized. Aortic valve sclerosis/calcification is  present, without any evidence of aortic stenosis.   6. The inferior vena cava is normal in size with greater than 50%  respiratory variability, suggesting right atrial pressure of 3 mmHg.   Comparison(s): No significant change from prior study.    Epic records are reviewed at length today  CHA2DS2-VASc Score = 4  [name] score is based upon: CHF History: 0 HTN History: 1 Diabetes History: 0 Stroke History: 0 Vascular Disease History: 0 Age Score: 2 Gender Score: 1        ASSESSMENT AND PLAN: Persistent Atrial Fibrillation/atrial flutter [name] CHA2DS2-VASc score is 4, indicating a 4.8% annual risk of stroke.   Diltiazem discontinued due to adverse reaction (itching) S/p Afib ablation 03/23/22 at [facility]. She remains in rate controlled atrial flutter.  Will arrange for DCCV.  Continue bisoprolol 5 mg daily Continue flecainide 75 mg BID Continue Eliquis 5 mg BID, she denies any missed doses in the past 3 weeks.  Going forward, if she does not feel any different in SR, she may opt for rate control only.   Secondary Hypercoagulable State (ICD10:  D68.69) The patient is at significant risk for stroke/thromboembolism based upon her  CHA2DS2-VASc Score of  4.  Continue Apixaban (Eliquis).   HTN Stable on current regimen  OSA  She is waiting to receive her CPAP machine.  The importance of adequate treatment of sleep apnea was discussed today in order to improve our ability to maintain sinus rhythm long term.    Follow up in the AF clinic post DCCV.    Informed Consent   Shared Decision Making/Informed Consent The risks (stroke, cardiac arrhythmias rarely resulting in the need for a temporary or permanent pacemaker, skin irritation or burns and complications associated with conscious sedation including aspiration, arrhythmia, respiratory failure and death), benefits (restoration of normal sinus rhythm) and alternatives of a direct current cardioversion were explained in detail to Ms. Ziebell and she agrees to proceed.     Jorja Loa PA-C [facility] [address] [place], [place] [address] [phone] 10/18/2022 1:43 PM

## 2022-10-18 NOTE — H&P (View-Only) (Signed)
Primary Care Physician: [facility] [place] Primary Cardiologist: Dr Eldridge Dace Primary Electrophysiologist: Dr Elberta Fortis Referring Physician: Otilio Saber PA   Anna Mathews is a 82 y.o. female with a history of HTN, HLD, ILD, atrial flutter, atrial fibrillation who presents for follow up in the [facility]. Patient is on Eliquis for a CHADS2VASC score of 4. She has been maintained on flecainide for rhythm control. She was seen 07/13/21 and found to be in atrial flutter. She presented for DCCV on 07/26/21 but had converted back to SR so the procedure was cancelled. Prior to the onset of her flutter, she had pneumonia and was on a steroid taper. Patient developed itching all over her body after starting diltiazem. This was discontinued and she was started on bisoprolol.   Seen by [facility] EP on 07/27/22 and appeared to be in atrial flutter per notes. She was set up for DCCV but had to rescheduled due to missed doses of anticoagulation and travel.  On follow up today, patient remains in rate controlled atrial flutter. She is mostly cardiac unaware but does get dizzy at times. She denies any missed doses of anticoagulation in the past 3 weeks.   Today, she denies symptoms of palpitations, chest pain, orthopnea, PND, presyncope, syncope, snoring, daytime somnolence, bleeding, or neurologic sequela. The patient is tolerating medications without difficulties and is otherwise without complaint today.    Atrial Fibrillation Risk Factors:  she does not have symptoms or diagnosis of sleep apnea. she does not have a history of rheumatic fever.   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     alendronate (FOSAMAX) 70 MG tablet Take 70 mg by mouth once a week.     b complex vitamins capsule Take 1 capsule by mouth daily.     bisoprolol (ZEBETA) 5 MG tablet TAKE 1 TABLET (5 MG TOTAL) BY MOUTH DAILY. 90 tablet 2   busPIRone (BUSPAR) 15 MG tablet Take 15 mg by mouth 2 (two) times daily.     Calcium Citrate (CITRACAL PO) Take 2 tablets by mouth 2 (two) times daily.     Clobetasol Propionate 0.05 % shampoo Apply 1 Application topically daily as needed (itching).     ELIQUIS 5 MG TABS tablet TAKE 1 TABLET BY MOUTH TWICE A DAY 60 tablet 6   escitalopram (LEXAPRO) 5 MG tablet Take 5 mg by mouth daily.     flecainide (TAMBOCOR) 150 MG tablet TAKE 1 TABLET BY MOUTH TWICE A DAY (Patient taking differently: Take 75 mg by mouth 2 (two) times daily.) 180 tablet 1   loperamide (IMODIUM) 1 MG/5ML solution Take 1 mg by mouth as needed for diarrhea or loose stools.     OVER THE COUNTER MEDICATION Take 1 tablet by  mouth 2 (two) times daily. MagMind supplement     Polyethyl Glycol-Propyl Glycol (LUBRICATING EYE DROPS OP) Place 1 drop into both eyes daily as needed (dry eyes).     rosuvastatin (CRESTOR) 5 MG tablet Take 5 mg by mouth daily.     No current facility-administered medications for this encounter.    Allergies  Allergen Reactions   Atorvastatin Other (See Comments)    messed with her liver   Penicillins Other (See Comments)    Itching in the eyes    Amlodipine Itching    Purpura rash   Diltiazem Itching and Rash    burning    ROS- All systems  are reviewed and negative except as per the HPI above.  Physical Exam: Vitals:   10/18/22 1329  BP: 108/74  Pulse: (!) 59  Weight: 62.1 kg  Height: 5' 3.5" (1.613 m)    GEN: Well nourished, well developed in no acute distress NECK: No JVD; No carotid bruits CARDIAC: Irregularly irregular rate and rhythm, no murmurs, rubs, gallops RESPIRATORY:  Clear to auscultation without rales, wheezing or rhonchi  ABDOMEN: Soft, non-tender, non-distended EXTREMITIES:  No edema; No deformity    Wt Readings from Last 3 Encounters:  10/18/22 62.1 kg  09/03/22 60.5 kg  08/10/22 59 kg    EKG today demonstrates  Atrial flutter with variable block Vent. rate 59 BPM PR interval * ms QRS duration 110 ms QT/QTcB 422/417 ms   Echo 02/04/21 demonstrated  1. Left ventricular ejection fraction, by estimation, is 60 to 65%. The  left ventricle has normal function. The left ventricle has no regional  wall motion abnormalities. There is mild asymmetric left ventricular  hypertrophy of the basal-septal segment. Left ventricular diastolic parameters are indeterminate.   2. Right ventricular systolic function is normal. The right ventricular  size is normal. There is normal pulmonary artery systolic pressure. The  estimated right ventricular systolic pressure is 26.4 mmHg.   3. Left atrial size was moderately dilated.   4. The mitral valve is degenerative. Trivial mitral valve regurgitation.  Moderate to severe mitral annular calcification.   5. The aortic valve is tricuspid. There is mild calcification of the  aortic valve. There is mild thickening of the aortic valve. Aortic valve  regurgitation is not visualized. Aortic valve sclerosis/calcification is  present, without any evidence of aortic stenosis.   6. The inferior vena cava is normal in size with greater than 50%  respiratory variability, suggesting right atrial pressure of 3 mmHg.   Comparison(s): No significant change from prior study.    Epic records are reviewed at length today  CHA2DS2-VASc Score = 4  [name] score is based upon: CHF History: 0 HTN History: 1 Diabetes History: 0 Stroke History: 0 Vascular Disease History: 0 Age Score: 2 Gender Score: 1        ASSESSMENT AND PLAN: Persistent Atrial Fibrillation/atrial flutter [name] CHA2DS2-VASc score is 4, indicating a 4.8% annual risk of stroke.   Diltiazem discontinued due to adverse reaction (itching) S/p Afib ablation 03/23/22 at [facility]. She remains in rate controlled atrial flutter.  Will arrange for DCCV.  Continue bisoprolol 5 mg daily Continue flecainide 75 mg BID Continue Eliquis 5 mg BID, she denies any missed doses in the past 3 weeks.  Going forward, if she does not feel any different in SR, she may opt for rate control only.   Secondary Hypercoagulable State (ICD10:  D68.69) The patient is at significant risk for stroke/thromboembolism based upon her  CHA2DS2-VASc Score of  4.  Continue Apixaban (Eliquis).   HTN Stable on current regimen  OSA  She is waiting to receive her CPAP machine.  The importance of adequate treatment of sleep apnea was discussed today in order to improve our ability to maintain sinus rhythm long term.    Follow up in the AF clinic post DCCV.    Informed Consent   Shared Decision Making/Informed Consent The risks (stroke, cardiac arrhythmias rarely resulting in the need for a temporary or permanent pacemaker, skin irritation or burns and complications associated with conscious sedation including aspiration, arrhythmia, respiratory failure and death), benefits (restoration of normal sinus rhythm) and alternatives of a direct current cardioversion were explained in detail to Ms. Ziebell and she agrees to proceed.     Jorja Loa PA-C [facility] [address] [place], [place] [address] [phone] 10/18/2022 1:43 PM

## 2022-10-19 NOTE — Progress Notes (Signed)
Unable to reach patient about procedure, but was able to leave a detailed message. Stated that the patient needed to arrive at the hospital at 0800 , remain NPO after 0000, needs to have a ride home and a responsible adult to stay with them for 24 hours after the procedure. Instructed the patient to call back if they had any questions.

## 2022-10-21 NOTE — Anesthesia Preprocedure Evaluation (Signed)
Anesthesia Evaluation  Patient identified by MRN, date of birth, ID band Patient awake    Reviewed: Allergy & Precautions, NPO status , Patient's Chart, lab work & pertinent test results, reviewed documented beta blocker date and time   History of Anesthesia Complications Negative for: history of anesthetic complications  Airway Mallampati: II  TM Distance: >3 FB Neck ROM: Full    Dental  (+) Dental Advisory Given   Pulmonary former smoker   Pulmonary exam normal        Cardiovascular hypertension, Pt. on medications and Pt. on home beta blockers + dysrhythmias Atrial Fibrillation  Rhythm:Irregular Rate:Normal   '23 TTE - EF 60 to 65%. There is mild asymmetric left ventricular hypertrophy of the basal-septal segment. Left atrial size was moderately dilated. Trivial MR.     Neuro/Psych  PSYCHIATRIC DISORDERS Anxiety     TIA   GI/Hepatic Neg liver ROS, hiatal hernia,GERD  Controlled,,  Endo/Other  negative endocrine ROS    Renal/GU negative Renal ROS     Musculoskeletal  (+) Arthritis ,    Abdominal   Peds  Hematology  On eliquis    Anesthesia Other Findings   Reproductive/Obstetrics                             Anesthesia Physical Anesthesia Plan  ASA: 3  Anesthesia Plan: General   Post-op Pain Management: Minimal or no pain anticipated   Induction: Intravenous  PONV Risk Score and Plan: 3 and Treatment may vary due to age or medical condition and Propofol infusion  Airway Management Planned: Natural Airway and Mask  Additional Equipment: None  Intra-op Plan:   Post-operative Plan:   Informed Consent: I have reviewed the patients History and Physical, chart, labs and discussed the procedure including the risks, benefits and alternatives for the proposed anesthesia with the patient or authorized representative who has indicated his/her understanding and acceptance.        Plan Discussed with: CRNA and Anesthesiologist  Anesthesia Plan Comments:         Anesthesia Quick Evaluation

## 2022-10-22 ENCOUNTER — Ambulatory Visit (HOSPITAL_COMMUNITY)
Admission: RE | Admit: 2022-10-22 | Discharge: 2022-10-22 | Disposition: A | Discharge status: Home / Self Care | Payer: Medicare PPO | Attending: Cardiovascular Disease | Admitting: Cardiovascular Disease

## 2022-10-22 ENCOUNTER — Ambulatory Visit (HOSPITAL_COMMUNITY): Payer: Self-pay | Admitting: Anesthesiology

## 2022-10-22 ENCOUNTER — Other Ambulatory Visit: Payer: Self-pay

## 2022-10-22 ENCOUNTER — Ambulatory Visit (HOSPITAL_BASED_OUTPATIENT_CLINIC_OR_DEPARTMENT_OTHER): Payer: Medicare PPO | Admitting: Anesthesiology

## 2022-10-22 ENCOUNTER — Encounter (HOSPITAL_COMMUNITY)
Admission: RE | Disposition: A | Discharge status: Home / Self Care | Payer: Self-pay | Source: Home / Self Care | Attending: Cardiovascular Disease

## 2022-10-22 DIAGNOSIS — Z7901 Long term (current) use of anticoagulants: Secondary | ICD-10-CM | POA: Diagnosis not present

## 2022-10-22 DIAGNOSIS — I4819 Other persistent atrial fibrillation: Secondary | ICD-10-CM | POA: Diagnosis not present

## 2022-10-22 DIAGNOSIS — I4891 Unspecified atrial fibrillation: Secondary | ICD-10-CM

## 2022-10-22 DIAGNOSIS — D6869 Other thrombophilia: Secondary | ICD-10-CM | POA: Insufficient documentation

## 2022-10-22 DIAGNOSIS — J849 Interstitial pulmonary disease, unspecified: Secondary | ICD-10-CM | POA: Insufficient documentation

## 2022-10-22 DIAGNOSIS — E785 Hyperlipidemia, unspecified: Secondary | ICD-10-CM | POA: Diagnosis not present

## 2022-10-22 DIAGNOSIS — I10 Essential (primary) hypertension: Secondary | ICD-10-CM | POA: Diagnosis not present

## 2022-10-22 DIAGNOSIS — I4892 Unspecified atrial flutter: Secondary | ICD-10-CM | POA: Insufficient documentation

## 2022-10-22 DIAGNOSIS — G4733 Obstructive sleep apnea (adult) (pediatric): Secondary | ICD-10-CM | POA: Insufficient documentation

## 2022-10-22 DIAGNOSIS — Z87891 Personal history of nicotine dependence: Secondary | ICD-10-CM | POA: Diagnosis not present

## 2022-10-22 DIAGNOSIS — Z79899 Other long term (current) drug therapy: Secondary | ICD-10-CM | POA: Insufficient documentation

## 2022-10-22 DIAGNOSIS — I484 Atypical atrial flutter: Secondary | ICD-10-CM

## 2022-10-22 HISTORY — PX: CARDIOVERSION: SHX1299

## 2022-10-22 SURGERY — CARDIOVERSION
Anesthesia: General

## 2022-10-22 MED ORDER — LIDOCAINE 2% (20 MG/ML) 5 ML SYRINGE
INTRAMUSCULAR | Status: DC | PRN
  Administered 2022-10-22: 30 mg via INTRAVENOUS

## 2022-10-22 MED ORDER — SODIUM CHLORIDE 0.9 % IV SOLN
INTRAVENOUS | Status: DC
  Administered 2022-10-22: 20 mL/h via INTRAVENOUS

## 2022-10-22 MED ORDER — PROPOFOL 10 MG/ML IV BOLUS
INTRAVENOUS | Status: DC | PRN
  Administered 2022-10-22: 50 mg via INTRAVENOUS

## 2022-10-22 SURGICAL SUPPLY — 1 items: ELECT DEFIB PAD ADLT CADENCE (PAD) ×1 IMPLANT

## 2022-10-22 NOTE — Anesthesia Postprocedure Evaluation (Signed)
Anesthesia Post Note  Patient: Anna Mathews  Procedure(s) Performed: CARDIOVERSION     Patient location during evaluation: PACU Anesthesia Type: General Level of consciousness: awake and alert Pain management: pain level controlled Vital Signs Assessment: post-procedure vital signs reviewed and stable Respiratory status: spontaneous breathing, nonlabored ventilation and respiratory function stable Cardiovascular status: stable and blood pressure returned to baseline Anesthetic complications: no   No notable events documented.  Last Vitals:  Vitals:   10/22/22 1050 10/22/22 1055  BP:  (!) 166/75  Pulse: (!) 47 (!) 50  Resp: 15 13  Temp: (!) 36 C   SpO2: 96% 97%    Last Pain:  Vitals:   10/22/22 1050  TempSrc: Temporal  PainSc: 0-No pain                 Beryle Lathe

## 2022-10-22 NOTE — Interval H&P Note (Signed)
History and Physical Interval Note:  10/22/2022 9:38 AM  Anna Mathews  has presented today for surgery, with the diagnosis of AFIB.  The various methods of treatment have been discussed with the patient and family. After consideration of risks, benefits and other options for treatment, the patient has consented to  Procedure(s): CARDIOVERSION (N/A) as a surgical intervention.  [name] history has been reviewed, patient examined, no change in status, stable for surgery.  I have reviewed the patient's chart and labs.  Questions were answered to the patient's satisfaction.     Charlton Haws

## 2022-10-22 NOTE — Transfer of Care (Signed)
Immediate Anesthesia Transfer of Care Note  Patient: Anna Mathews  Procedure(s) Performed: CARDIOVERSION  Patient Location: PACU and Cath Lab  Anesthesia Type:MAC  Level of Consciousness: drowsy, patient cooperative, and responds to stimulation  Airway & Oxygen Therapy: Patient Spontanous Breathing and Patient connected to face mask oxygen  Post-op Assessment: Report given to RN and Post -op Vital signs reviewed and stable  Post vital signs: Reviewed and stable  Last Vitals:  Vitals Value Taken Time  BP    Temp    Pulse 56 10/22/22 1021  Resp 17 10/22/22 1021  SpO2 94 % 10/22/22 1021  Vitals shown include unfiled device data.  Last Pain:  Vitals:   10/22/22 0943  TempSrc: Temporal         Complications: No notable events documented.

## 2022-10-22 NOTE — Discharge Instructions (Signed)
 Electrical Cardioversion Electrical cardioversion is the delivery of a jolt of electricity to restore a normal rhythm to the heart. A rhythm that is too fast or is not regular (arrhythmia) keeps the heart from pumping blood well. There is also another type of cardioversion called a chemical (pharmacologic) cardioversion. This is when your health care provider gives you one or more medicines to bring back your regular heart rhythm. Electrical cardioversion is done as a scheduled procedure for arrhythmiasthat are not life-threatening. Electrical cardioversion may also be done in an emergency for sudden life-threatening arrhythmias. Tell a health care provider about: Any allergies you have. All medicines you are taking, including vitamins, herbs, eye drops, creams, and over-the-counter medicines. Any problems you or family members have had with sedatives or anesthesia. Any bleeding problems you have. Any surgeries you have had, including a pacemaker, defibrillator, or other implanted device. Any medical conditions you have. Whether you are pregnant or may be pregnant. What are the risks? Your provider will talk with you about risks. These include: Allergic reactions to medicines. Irritation to the skin on your chest or back where the sticky pads (electrodes) or paddles were put during electrical cardioversion. A blood clot that breaks free and travels to other parts of your body, such as your brain. Return of a worse abnormal heart rhythm that will need to be treated with medicines, a pacemaker, or an implantable cardioverter defibrillator (ICD). What happens before the procedure? Medicines Your provider may give you: Blood-thinning medicines (anticoagulants) so your blood does not clot as easily. If your provider gives you this medicine, you may need to take it for 4 weeks before the procedure. Medicines to help stabilize your heart rate and rhythm. Ask your provider about: Changing or stopping  your regular medicines. These include any diabetes medicines or blood thinners you take. Taking medicines such as aspirin and ibuprofen. These medicines can thin your blood. Do not take them unless your provider tells you to. Taking over-the-counter medicines, vitamins, herbs, and supplements. General instructions Follow instructions from your provider about what you may eat and drink. Do not put any lotions, powders, or ointments on your chest and back for 24 hours before the procedure. They can cause problems with the electrodes or paddles used to deliver electricity to your heart. Do not wear jewelry as this can interfere with delivering electricity to your heart. If you will be going home right after the procedure, plan to have a responsible adult: Take you home from the hospital or clinic. You will not be allowed to drive. Care for you for the time you are told. Tests You may have an exam or testing. This may include: Blood labs. A transesophageal echocardiogram (TEE). What happens during the procedure?     An IV will be inserted into one of your veins. You will be given a sedative. This helps you relax. Electrodes or metal paddles will be placed on your chest. They may be placed in one of these ways: One placed on your right chest, the other on the left ribs. One placed on your chest and the other on your back. An electrical shock will be delivered. The shock briefly stops (resets) your heart rhythm. Your provider will check to see if your heart rhythm is now normal. Some people need only one shock. Some need more to restore a normal heart rhythm. The procedure may vary among providers and hospitals. What happens after the procedure? Your blood pressure, heart rate, breathing rate, and blood oxygen  level will be monitored until you leave the hospital or clinic. Your heart rhythm will be watched to make sure it does not change. This information is not intended to replace advice  given to you by your health care provider. Make sure you discuss any questions you have with your health care provider. Document Revised: 08/31/2021 Document Reviewed: 08/31/2021 Elsevier Patient Education  2024 [employer].

## 2022-10-22 NOTE — CV Procedure (Signed)
DCC: Anesthesia: Propofol On Rx eliquis with no missed doses  DCC x 1 150 J biphasic Converted from afib rate 111 to NSR rate 58 bpm  No immediate neurologic sequelae  Charlton Haws MD [facility]

## 2022-10-22 NOTE — Interval H&P Note (Signed)
History and Physical Interval Note:  10/22/2022 10:25 AM  Anna Mathews  has presented today for surgery, with the diagnosis of AFIB.  The various methods of treatment have been discussed with the patient and family. After consideration of risks, benefits and other options for treatment, the patient has consented to  Procedure(s): CARDIOVERSION (N/A) as a surgical intervention.  [name] history has been reviewed, patient examined, no change in status, stable for surgery.  I have reviewed the patient's chart and labs.  Questions were answered to the patient's satisfaction.     Charlton Haws

## 2022-10-23 ENCOUNTER — Encounter (HOSPITAL_COMMUNITY): Payer: Self-pay | Admitting: Cardiovascular Disease

## 2022-10-26 DIAGNOSIS — M1611 Unilateral primary osteoarthritis, right hip: Secondary | ICD-10-CM | POA: Diagnosis not present

## 2022-10-31 DIAGNOSIS — Z8679 Personal history of other diseases of the circulatory system: Secondary | ICD-10-CM | POA: Diagnosis not present

## 2022-10-31 DIAGNOSIS — Z79899 Other long term (current) drug therapy: Secondary | ICD-10-CM | POA: Diagnosis not present

## 2022-10-31 DIAGNOSIS — I499 Cardiac arrhythmia, unspecified: Secondary | ICD-10-CM | POA: Diagnosis not present

## 2022-10-31 DIAGNOSIS — I4819 Other persistent atrial fibrillation: Secondary | ICD-10-CM | POA: Diagnosis not present

## 2022-10-31 DIAGNOSIS — I4892 Unspecified atrial flutter: Secondary | ICD-10-CM | POA: Diagnosis not present

## 2022-10-31 DIAGNOSIS — Z5181 Encounter for therapeutic drug level monitoring: Secondary | ICD-10-CM | POA: Diagnosis not present

## 2022-11-07 ENCOUNTER — Ambulatory Visit (HOSPITAL_COMMUNITY): Payer: Medicare PPO | Admitting: Physician Assistant

## 2022-11-12 DIAGNOSIS — Z7901 Long term (current) use of anticoagulants: Secondary | ICD-10-CM | POA: Diagnosis not present

## 2022-11-12 DIAGNOSIS — R001 Bradycardia, unspecified: Secondary | ICD-10-CM | POA: Diagnosis not present

## 2022-11-12 DIAGNOSIS — Z87891 Personal history of nicotine dependence: Secondary | ICD-10-CM | POA: Diagnosis not present

## 2022-11-12 DIAGNOSIS — I10 Essential (primary) hypertension: Secondary | ICD-10-CM | POA: Diagnosis not present

## 2022-11-12 DIAGNOSIS — I4819 Other persistent atrial fibrillation: Secondary | ICD-10-CM | POA: Diagnosis not present

## 2022-11-12 DIAGNOSIS — I517 Cardiomegaly: Secondary | ICD-10-CM | POA: Diagnosis not present

## 2022-11-23 DIAGNOSIS — M1611 Unilateral primary osteoarthritis, right hip: Secondary | ICD-10-CM | POA: Diagnosis not present

## 2022-12-21 ENCOUNTER — Other Ambulatory Visit: Payer: Self-pay | Admitting: Internal Medicine

## 2022-12-28 DIAGNOSIS — M1611 Unilateral primary osteoarthritis, right hip: Secondary | ICD-10-CM | POA: Diagnosis not present

## 2023-01-03 DIAGNOSIS — Z23 Encounter for immunization: Secondary | ICD-10-CM | POA: Diagnosis not present

## 2023-01-03 DIAGNOSIS — Z9181 History of falling: Secondary | ICD-10-CM | POA: Diagnosis not present

## 2023-01-03 DIAGNOSIS — Z0000 Encounter for general adult medical examination without abnormal findings: Secondary | ICD-10-CM | POA: Diagnosis not present

## 2023-01-03 DIAGNOSIS — Z1331 Encounter for screening for depression: Secondary | ICD-10-CM | POA: Diagnosis not present

## 2023-01-03 DIAGNOSIS — Z6826 Body mass index (BMI) 26.0-26.9, adult: Secondary | ICD-10-CM | POA: Diagnosis not present

## 2023-01-07 DIAGNOSIS — Z23 Encounter for immunization: Secondary | ICD-10-CM | POA: Diagnosis not present

## 2023-01-07 DIAGNOSIS — D241 Benign neoplasm of right breast: Secondary | ICD-10-CM | POA: Diagnosis not present

## 2023-01-07 DIAGNOSIS — I482 Chronic atrial fibrillation, unspecified: Secondary | ICD-10-CM | POA: Diagnosis not present

## 2023-01-07 DIAGNOSIS — E119 Type 2 diabetes mellitus without complications: Secondary | ICD-10-CM | POA: Diagnosis not present

## 2023-01-07 DIAGNOSIS — N6312 Unspecified lump in the right breast, upper inner quadrant: Secondary | ICD-10-CM | POA: Diagnosis not present

## 2023-01-07 DIAGNOSIS — E782 Mixed hyperlipidemia: Secondary | ICD-10-CM | POA: Diagnosis not present

## 2023-01-07 DIAGNOSIS — N6311 Unspecified lump in the right breast, upper outer quadrant: Secondary | ICD-10-CM | POA: Diagnosis not present

## 2023-01-07 DIAGNOSIS — I10 Essential (primary) hypertension: Secondary | ICD-10-CM | POA: Diagnosis not present

## 2023-01-07 DIAGNOSIS — F411 Generalized anxiety disorder: Secondary | ICD-10-CM | POA: Diagnosis not present

## 2023-01-09 ENCOUNTER — Ambulatory Visit: Payer: Medicare PPO | Attending: Cardiovascular Disease | Admitting: Cardiovascular Disease

## 2023-01-09 ENCOUNTER — Encounter: Payer: Self-pay | Admitting: Cardiovascular Disease

## 2023-01-09 VITALS — BP 122/72 | HR 87 | Ht 63.5 in | Wt 146.4 lb

## 2023-01-09 DIAGNOSIS — I700 Atherosclerosis of aorta: Secondary | ICD-10-CM

## 2023-01-09 DIAGNOSIS — E7800 Pure hypercholesterolemia, unspecified: Secondary | ICD-10-CM | POA: Diagnosis not present

## 2023-01-09 DIAGNOSIS — G4733 Obstructive sleep apnea (adult) (pediatric): Secondary | ICD-10-CM

## 2023-01-09 DIAGNOSIS — R7303 Prediabetes: Secondary | ICD-10-CM

## 2023-01-09 DIAGNOSIS — I4819 Other persistent atrial fibrillation: Secondary | ICD-10-CM | POA: Diagnosis not present

## 2023-01-09 DIAGNOSIS — I10 Essential (primary) hypertension: Secondary | ICD-10-CM

## 2023-01-09 NOTE — Progress Notes (Signed)
Cardiology Office Note:    Date:  01/09/2023 she had another cardioversion 11/12/2022 at [facility] with Dr. Teena Dunk  ID:  Anna Mathews, DOB 10/05/1940, MRN [id number]  PCP:  [facility] Providers Cardiologist:  Lance Muss, MD Electrophysiologist:  Will Jorja Loa, MD     Referring MD: [facility]   Chief Complaint  Patient presents with   Atrial Fibrillation    History of Present Illness:    Anna Mathews is a 82 y.o. female with a hx of paroxysmal and persistent atrial flutter and atrial fibrillation, here to establish follow-up after Dr. Hoyle Barr departure.  Additional medical problems include hypertension, hypercholesterolemia, prediabetes and recently diagnosed mild obstructive sleep apnea, aortic atherosclerosis.  She generally feels well, although she does have problems with dizziness for the first few hours after she takes her bisoprolol.  If she has to drive if she will take the medication later in the day, once she returns home because of this.  She is tolerating the arrhythmia reasonably well.  She has had multiple cardioversions, most recently:  - she was scheduled for cardioversion in our institution in July 2023 but when she came in for the procedure she had spontaneously converted to normal rhythm - she saw Dr. Elberta Fortis in November 2023 and he recommended an ablation; due to the backlog in scheduling, he also recommended an initial hospitalization for dofetilide antiarrhythmic therapy(this did not occur) - she had an ablation (PVI) performed at [facility] 03/23/2022 (Dr. Macon Large) so she was hospital, when she returned for a follow-up there in July she was back in atrial fibrillation.   - she was scheduled for cardioversion at [facility] in July, but this was canceled due to poor compliance with anticoagulation. - she then underwent a successful cardioversion on 10/22/2022 at [facility] (single 150 J shock).    - she saw Dr. Macon Large at [facility] on 10/31/2022 and he recommended discontinuing the flecainide and hospitalization for dofetilide loading.  When she presented for dofetilide loading the decision was made to try Multaq instead. - she had another successful cardioversion at [facility] 11/12/2022 and it appears that she was on Multaq, although flecainide was still on her list as well.   She has stopped taking her antiarrhythmic because this caused intolerable dizziness.  She is now only taking bisoprolol 5 mg once daily.  For the most part she feels well.  She does not have shortness of breath or chest pain at rest or with activity.  She has not had any problems with lower extremity edema, focal neurological events or any bleeding issues on Eliquis anticoagulation.  She does not have a history of snoring and she denies daytime hypersomnolence.  She has been diagnosed with sleep apnea (mild AHI 12/h, Dr. Francine Graven), but has not yet started using her CPAP equipment.  She does not have much in the way of structural heart problems.  Most recent echo showed normal LVEF and no evidence of significant LVH.  The left atrium is moderately dilated (end-systolic diameter 4.5 cm, end-systolic volume index 38.8 mL/m).  There are no significant valvular abnormalities other than mitral annulus calcification aortic valve sclerosis.  CHA2DS2-VASc score is 42 (age, HTN, gender).  Her blood pressure was high earlier this week when she saw another physician, but today her blood pressure control is excellent.  She also has been metabolic control with HDL 60, LDL 80 and mildly elevated glucose levels (hemoglobin A1c 6.3%, prediabetes range).  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   Hyperlipidemia    Hypertension     Osteopenia    Postmenopausal hormone replacement therapy 1989 - 07/2000    Past Surgical History:  Procedure Laterality Date   BREAST DUCTAL SYSTEM EXCISION Left 06/2002   duct ectasia with fibrosis - atypical lobular hyperplasia with micro calcifications - tamoxifen X 28 months then change to Evista 10/06   BREAST LUMPECTOMY WITH RADIOACTIVE SEED LOCALIZATION Right 01/27/2020   Procedure: RIGHT BREAST LUMPECTOMY WITH RADIOACTIVE SEED LOCALIZATION;  Surgeon: Abigail Miyamoto, MD;  Location: [facility] OR;  Service: General;  Laterality: Right;   BREAST SURGERY Left 4/99   breast biopsy - fibrosis   BRONCHIAL BIOPSY  10/11/2021   Procedure: BRONCHIAL BIOPSIES;  Surgeon: Martina Sinner, MD;  Location: [facility] ENDOSCOPY;  Service: Pulmonary;;   BRONCHIAL WASHINGS  10/11/2021   Procedure: BRONCHIAL WASHINGS;  Surgeon: Martina Sinner, MD;  Location: [facility] ENDOSCOPY;  Service: Pulmonary;;   CARDIOVERSION N/A 07/16/2017   Procedure: CARDIOVERSION;  Surgeon: Jake Bathe, MD;  Location: [facility] ENDOSCOPY;  Service: Cardiovascular;  Laterality: N/A;   CARDIOVERSION N/A 10/20/2018   Procedure: CARDIOVERSION;  Surgeon: Jodelle Red, MD;  Location: [facility] ENDOSCOPY;  Service: Cardiovascular;  Laterality: N/A;   CARDIOVERSION N/A 10/22/2022   Procedure: CARDIOVERSION;  Surgeon: Wendall Stade, MD;  Location: [facility] INVASIVE CV LAB;  Service: Cardiovascular;  Laterality: N/A;   CATARACT EXTRACTION W/ INTRAOCULAR LENS IMPLANT Right    CESAREAN SECTION     X 2   HYSTEROSCOPY  4/95   w/D&C   TEE WITHOUT CARDIOVERSION N/A 10/20/2018   Procedure: TRANSESOPHAGEAL ECHOCARDIOGRAM (TEE);  Surgeon: Jodelle Red, MD;  Location: [facility] ENDOSCOPY;  Service: Cardiovascular;  Laterality: N/A;   VIDEO BRONCHOSCOPY N/A 10/11/2021   Procedure: VIDEO BRONCHOSCOPY WITH FLUORO;  Surgeon: Martina Sinner, MD;  Location: [facility] ENDOSCOPY;  Service: Pulmonary;  Laterality: N/A;    Current Medications: Current Meds  Medication Sig    acetaminophen (TYLENOL) 500 MG tablet Take 500 mg by mouth every 6 (six) hours as needed for headache or moderate pain.   b complex vitamins capsule Take 1 capsule by mouth daily.   bisoprolol (ZEBETA) 5 MG tablet TAKE 1 TABLET (5 MG TOTAL) BY MOUTH DAILY.   busPIRone (BUSPAR) 15 MG tablet Take 15 mg by mouth 2 (two) times daily.   Calcium Citrate (CITRACAL PO) Take 2 tablets by mouth 2 (two) times daily.   dronedarone (MULTAQ) 400 MG tablet Take 400 mg by mouth 2 (two) times daily with a meal.   ELIQUIS 5 MG TABS tablet TAKE 1 TABLET BY MOUTH TWICE A DAY   escitalopram (LEXAPRO) 5 MG tablet Take 5 mg by mouth daily.   loperamide (IMODIUM) 1 MG/5ML solution Take 1 mg by mouth as needed for diarrhea or loose stools.   Polyethyl Glycol-Propyl Glycol (LUBRICATING EYE DROPS OP) Place 1 drop into both eyes daily as needed (dry eyes).   rosuvastatin (CRESTOR) 5 MG tablet Take 5 mg by mouth daily.     Allergies:   Atorvastatin, Penicillins, Amlodipine, and Diltiazem   Social History   Socioeconomic History   Marital status: Widowed    Spouse name: Not on file   Number of children: 2  Years of education: Not on file   Highest education level: Not on file  Occupational History   Not on file  Tobacco Use   Smoking status: Former    Current packs/day: 0.00    Average packs/day: 0.3 packs/day for 12.0 years (3.0 ttl pk-yrs)    Types: Cigarettes    Start date: 01/16/1957    Quit date: 01/23/1968    Years since quitting: 55.0   Smokeless tobacco: Never   Tobacco comments:    Former smoker 08/04/21  Vaping Use   Vaping status: Never Used  Substance and Sexual Activity   Alcohol use: Not Currently    Comment: social   Drug use: No   Sexual activity: Not Currently    Partners: Male    Birth control/protection: Post-menopausal  Other Topics Concern   Not on file  Social History Narrative   Not on file   Social Drivers of Health   Financial Resource Strain: Low Risk  (03/24/2022)    Received from [facility], [facility]   Overall Financial Resource Strain (CARDIA)    Difficulty of Paying Living Expenses: Not hard at all  Food Insecurity: No Food Insecurity (03/24/2022)   Received from [facility], [facility]   Hunger Vital Sign    Worried About Running Out of Food in the Last Year: Never true    Ran Out of Food in the Last Year: Never true  Transportation Needs: Unknown (03/24/2022)   Received from [facility], [facility]   [facility] - Transportation    In the past 12 months, has lack of transportation kept you from medical appointments or from getting medications?: No    Lack of Transportation (Non-Medical): Not on file  Physical Activity: Not on file  Stress: Not on file  Social Connections: Not on file     Family History: [name] family history includes Breast cancer in her maternal grandmother; COPD in her father; Diabetes in her maternal aunt, maternal aunt, maternal grandfather, and son; Heart disease in her father; Hypertension in her mother; Kidney cancer in her brother; Rheum arthritis in her brother.  ROS:   Please see the history of present illness.     All other systems reviewed and are negative.  EKGs/Labs/Other Studies Reviewed:    The following studies were reviewed today:  EKG Interpretation Date/Time:  Wednesday January 09 2023 10:30:10 EST Ventricular Rate:  87 PR Interval:    QRS Duration:  94 QT Interval:  370 QTC Calculation: 445 R Axis:   44  Text Interpretation: Atrial fibrillation When compared with ECG of 22-Oct-2022 10:37, Atrial fibrillation has replaced Sinus rhythm Vent. rate has increased BY  36 BPM Questionable change in QRS duration Confirmed by Vickey Boak [phone]) on 01/09/2023 10:47:20 AM    Recent Labs: 10/18/2022: BUN 30; Creatinine, Ser 1.04; Hemoglobin 13.7; Platelets 205; Potassium 4.3; Sodium 138   Recent Lipid Panel No results found for: "CHOL", "TRIG", "HDL", "CHOLHDL", "VLDL", "LDLCALC", "LDLDIRECT"  05/25/2022 cholesterol 153, HDL 60, LDL 80, triglycerides 64, hemoglobin A1c 6.3% Most recent TSH that I can find is from 2022 and was normal at 3.71  Risk Assessment/Calculations:    CHA2DS2-VASc Score = 4   This indicates a 4.8% annual risk of stroke. [name] score is based upon: CHF History: 0 HTN History: 1 Diabetes History: 0 Stroke History: 0 Vascular Disease History: 0 Age Score: 2 Gender Score: 1  Physical Exam:    VS:  BP 122/72 (BP Location: Left Arm, Patient Position: Sitting, Cuff Size: Normal)   Pulse 87   Ht 5' 3.5" (1.613 m)   Wt 146 lb 6.4 oz (66.4 kg)   LMP 01/23/1988 (Approximate)   SpO2 96%   BMI 25.53 kg/m     Wt Readings from Last 3 Encounters:  01/09/23 146 lb 6.4 oz (66.4 kg)  10/22/22 136 lb 14.5 oz (62.1 kg)  10/18/22 136 lb 12.8 oz (62.1 kg)     GEN:  Well nourished, well developed in no acute distress HEENT: Normal NECK: No JVD; No carotid bruits LYMPHATICS: No lymphadenopathy CARDIAC: Irregular rhythm, no murmurs, rubs, gallops RESPIRATORY:  Clear to auscultation without rales, wheezing or rhonchi  ABDOMEN: Soft, non-tender, non-distended MUSCULOSKELETAL:  No edema; No deformity  SKIN: Warm and dry NEUROLOGIC:  Alert and oriented x 3 PSYCHIATRIC:  Normal affect   ASSESSMENT:    1. Persistent atrial fibrillation (HCC)   2. Essential hypertension   3. OSA (obstructive sleep apnea)   4. Hypercholesterolemia   5. Prediabetes   6. Aortic atherosclerosis (HCC)    PLAN:    In order of problems listed above:  Afib: She is fully anticoagulated and has good ventricular rate control.  She has had an ablation and has apparently failed 2 different antiarrhythmics, flecainide and Multaq.  There is attempted ablation for dofetilide loading, but the [facility] electrophysiologist on staff decided this was not a good  choice for her Jardiance.  Options including another ablation procedure, amiodarone antiarrhythmic therapy, or simply treating this as longstanding persistent atrial fibrillation.  Would discuss this with her electrophysiologist at [facility].  Will send him a message.  HTN: Appears to be well-controlled on the current medications.  She has a history of variable blood pressure and has had numerous intolerances to antihypertensive medications (rash with amlodipine, rash with quinapril, dizziness with chlorthalidone).  Doing reasonably well on bisoprolol.  Will continue this, although she is having some occasional dizziness. OSA: Need to figure out why she is not yet using CPAP. HLP: She has evidence of aortic atherosclerosis and coronary calcifications, but no clinically established CAD or PAD.  Lipid parameters are in acceptable range on rosuvastatin.  Continue. Prediabetes: Diet controlled. Possible early cognitive issues: This was my first interaction with Anna Mathews.  She seems surprisingly unfamiliar with the names of many of the medications that she has been prescribed and had some difficulty remaining  names of previous medical providers.  It is hard to draw any conclusions from our brief interaction today, but I would be on the look out for any other signs of memory impairment, since this would interfere with medication compliance and would have an impact on the future therapeutic plans.           Medication Adjustments/Labs and Tests Ordered: Current medicines are reviewed at length with the patient today.  Concerns regarding medicines are outlined above.  Orders Placed This Encounter  Procedures   EKG 12-Lead   No orders of the defined types were placed in this encounter.   Patient Instructions  Medication Instructions:  No changes *If you need a refill on your cardiac medications before your next appointment, please call your pharmacy*  Follow-Up: At [facility], you and  your health needs are our priority.  As part of our continuing mission to provide you with exceptional heart care, we have created designated Provider Care Teams.  These Care Teams include your primary  Cardiologist (physician) and Advanced Practice Providers (APPs -  Physician Assistants and Nurse Practitioners) who all work together to provide you with the care you need, when you need it.  We recommend signing up for the patient portal called "MyChart".  Sign up information is provided on this After Visit Summary.  MyChart is used to connect with patients for Virtual Visits (Telemedicine).  Patients are able to view lab/test results, encounter notes, upcoming appointments, etc.  Non-urgent messages can be sent to your provider as well.   To learn more about what you can do with MyChart, go to [email or web address].    Your next appointment:   1 year(s)  Provider:   Dr Royann Shivers         Signed, Thurmon Fair, MD  01/09/2023 11:47 AM    [facility]

## 2023-01-09 NOTE — Addendum Note (Signed)
Addended by: Thurmon Fair on: 01/09/2023 12:01 PM   Modules accepted: Orders

## 2023-01-09 NOTE — Patient Instructions (Signed)

## 2023-01-18 DIAGNOSIS — N390 Urinary tract infection, site not specified: Secondary | ICD-10-CM | POA: Diagnosis not present

## 2023-02-08 ENCOUNTER — Other Ambulatory Visit (HOSPITAL_COMMUNITY): Payer: Self-pay | Admitting: Cardiology

## 2023-02-08 ENCOUNTER — Other Ambulatory Visit (HOSPITAL_COMMUNITY): Payer: Self-pay | Admitting: Physician Assistant

## 2023-02-08 ENCOUNTER — Other Ambulatory Visit: Payer: Self-pay | Admitting: Primary Care

## 2023-02-08 ENCOUNTER — Other Ambulatory Visit: Payer: Self-pay | Admitting: Pulmonary Disease

## 2023-02-08 DIAGNOSIS — J8489 Other specified interstitial pulmonary diseases: Secondary | ICD-10-CM

## 2023-02-28 ENCOUNTER — Encounter: Payer: Self-pay | Admitting: Cardiovascular Disease

## 2023-02-28 NOTE — Telephone Encounter (Signed)
 Error

## 2023-03-03 ENCOUNTER — Other Ambulatory Visit (HOSPITAL_COMMUNITY): Payer: Self-pay | Admitting: Cardiology

## 2023-03-13 ENCOUNTER — Other Ambulatory Visit (HOSPITAL_COMMUNITY): Payer: Self-pay | Admitting: Cardiology

## 2023-03-14 NOTE — Telephone Encounter (Signed)
This is a A-fib clinic pt being seen for Dr. Elberta Fortis. Please address

## 2023-03-15 ENCOUNTER — Telehealth: Payer: Self-pay

## 2023-03-15 NOTE — Telephone Encounter (Signed)
   Pre-operative Risk Assessment    Patient Name: Anna Mathews  DOB: 07-08-1940 MRN: [id number]   Date of last office visit: 01/09/23 Date of next office visit: n/a   Request for Surgical Clearance    Procedure:   Right total hip arthroplasty   Date of Surgery:  Clearance 04/02/23                                 Surgeon:  Dr. Durene Romans   Surgeon's Group or Practice Name:  [facility]  Phone number:  [phone] Fax number:  [phone]   Type of Clearance Requested:   - Medical  - Pharmacy:  Hold Apixaban (Eliquis) Not indicated    Type of Anesthesia:  Spinal   Additional requests/questions:    Vance Peper   03/15/2023, 3:13 PM

## 2023-03-15 NOTE — Telephone Encounter (Signed)
 Patient has been scheduled for telephone visit.

## 2023-03-15 NOTE — Telephone Encounter (Signed)
   Name: Anna Mathews  DOB: 1940-08-13  MRN: [id number]  Primary Cardiologist: Lance Muss, MD   Preoperative team, please contact this patient and set up a phone call appointment for further preoperative risk assessment. Please obtain consent and complete medication review. Thank you for your help. Last seen by Dr. Royann Shivers on 01/09/2023  I confirm that guidance regarding antiplatelet and oral anticoagulation therapy has been completed and, if necessary, noted below.  Per office protocol, patient can hold Eliquis for 3 days prior to procedure.   Patient will not need bridging with Lovenox (enoxaparin) around procedure.  I also confirmed the patient resides in the state of [place]. As per [facility] Medical Board telemedicine laws, the patient must reside in the state in which the provider is licensed.   Joni Reining, NP 03/15/2023, 4:16 PM [facility]

## 2023-03-15 NOTE — Telephone Encounter (Signed)
Patient with diagnosis of atrial fibrillation on Eliquis for anticoagulation.    Procedure:   Right total hip arthroplasty    Date of Surgery:  Clearance 04/02/23   CHA2DS2-VASc Score = 4   This indicates a 4.8% annual risk of stroke. [name] score is based upon: CHF History: 0 HTN History: 1 Diabetes History: 0 Stroke History: 0 Vascular Disease History: 0 Age Score: 2 Gender Score: 1    CrCl 44 Platelet count 231  Per office protocol, patient can hold Eliquis for 3 days prior to procedure.   Patient will not need bridging with Lovenox (enoxaparin) around procedure.  **This guidance is not considered finalized until pre-operative APP has relayed final recommendations.**

## 2023-03-15 NOTE — Telephone Encounter (Signed)
 Patient has been scheduled for telephone visit med rec and consent done

## 2023-03-15 NOTE — Telephone Encounter (Signed)
Pharmacy please advise on holding Eliquis prior to right total hip arthroplasty scheduled for 04/02/2023.   Thank you! Samara Deist

## 2023-03-17 ENCOUNTER — Other Ambulatory Visit: Payer: Self-pay | Admitting: Internal Medicine

## 2023-03-18 NOTE — Progress Notes (Signed)
 Anesthesia Review:  PCP: Cardiologist : Croituri- LOV 01/09/23  K Lawrence clearance 03/15/23   PPM/ ICD: Device Orders: Rep Notified:  Chest x-ray : CT chest- 02/23/22  EKG : 01/09/23  Cardioversion- 10/22/22  EP Study- 10/22/22  Echo : 02/04/21  Stress test: Cardiac Cath :   Activity level:  Sleep Study/ CPAP : Fasting Blood Sugar :      / Checks Blood Sugar -- times a day:    Blood Thinner/ Instructions /Last Dose: ASA / Instructions/ Last Dose :    Eliquis

## 2023-03-18 NOTE — Patient Instructions (Signed)
 SURGICAL WAITING ROOM VISITATION  Patients having surgery or a procedure may have no more than 2 support people in the waiting area - these visitors may rotate.    Children under the age of 69 must have an adult with them who is not the patient.  Due to an increase in RSV and influenza rates and associated hospitalizations, children ages 22 and under may not visit patients in [facility] hospitals.  Visitors with respiratory illnesses are discouraged from visiting and should remain at home.  If the patient needs to stay at the hospital during part of their recovery, the visitor guidelines for inpatient rooms apply. Pre-op nurse will coordinate an appropriate time for 1 support person to accompany patient in pre-op.  This support person may not rotate.    Please refer to the [facility] website for the visitor guidelines for Inpatients (after your surgery is over and you are in a regular room).       Your procedure is scheduled on:  04/02/2023    Report to [facility] Main Entrance    Report to admitting at   0600AM   Call this number if you have problems the morning of surgery [phone]   Do not eat food :After Midnight.   After Midnight you may have the following liquids until ___ 0530___ AM DAY OF SURGERY  Water Non-Citrus Juices (without pulp, NO RED-Apple, White grape, White cranberry) Black Coffee (NO MILK/CREAM OR CREAMERS, sugar ok)  Clear Tea (NO MILK/CREAM OR CREAMERS, sugar ok) regular and decaf                             Plain Jell-O (NO RED)                                           Fruit ices (not with fruit pulp, NO RED)                                     Popsicles (NO RED)                                                               Sports drinks like Gatorade (NO RED)                   The day of surgery:  Drink ONE (1) Pre-Surgery Clear Ensure or G2 at  0530AM   ( have completed by ) the morning of surgery. Drink in one sitting. Do not sip.   This drink was given to you during your hospital  pre-op appointment visit. Nothing else to drink after completing the  Pre-Surgery Clear Ensure or G2.          If you have questions, please contact your surgeon's office.       Oral Hygiene is also important to reduce your risk of infection.  Remember - BRUSH YOUR TEETH THE MORNING OF SURGERY WITH YOUR REGULAR TOOTHPASTE  DENTURES WILL BE REMOVED PRIOR TO SURGERY PLEASE DO NOT APPLY "Poly grip" OR ADHESIVES!!!   Do NOT smoke after Midnight   Stop all vitamins and herbal supplements 7 days before surgery.   Take these medicines the morning of surgery with A SIP OF WATER:  Bisoprolol, buspar, Multaq   DO NOT TAKE ANY ORAL DIABETIC MEDICATIONS DAY OF YOUR SURGERY  Bring CPAP mask and tubing day of surgery.                              You may not have any metal on your body including hair pins, jewelry, and body piercing             Do not wear make-up, lotions, powders, perfumes/cologne, or deodorant  Do not wear nail polish including gel and S&S, artificial/acrylic nails, or any other type of covering on natural nails including finger and toenails. If you have artificial nails, gel coating, etc. that needs to be removed by a nail salon please have this removed prior to surgery or surgery may need to be canceled/ delayed if the surgeon/ anesthesia feels like they are unable to be safely monitored.   Do not shave  48 hours prior to surgery.               Men may shave face and neck.   Do not bring valuables to the hospital. [facility] IS NOT             RESPONSIBLE   FOR VALUABLES.   Contacts, glasses, dentures or bridgework may not be worn into surgery.   Bring small overnight bag day of surgery.   DO NOT BRING YOUR HOME MEDICATIONS TO THE HOSPITAL. PHARMACY WILL DISPENSE MEDICATIONS LISTED ON YOUR MEDICATION LIST TO YOU DURING YOUR ADMISSION IN THE HOSPITAL!    Patients discharged on  the day of surgery will not be allowed to drive home.  Someone NEEDS to stay with you for the first 24 hours after anesthesia.   Special Instructions: Bring a copy of your healthcare power of attorney and living will documents the day of surgery if you haven't scanned them before.              Please read over the following fact sheets you were given: IF YOU HAVE QUESTIONS ABOUT YOUR PRE-OP INSTRUCTIONS PLEASE CALL [phone]   If you received a COVID test during your pre-op visit  it is requested that you wear a mask when out in public, stay away from anyone that may not be feeling well and notify your surgeon if you develop symptoms. If you test positive for Covid or have been in contact with anyone that has tested positive in the last 10 days please notify you surgeon.      Pre-operative 5 CHG Bath Instructions   You can play a key role in reducing the risk of infection after surgery. Your skin needs to be as free of germs as possible. You can reduce the number of germs on your skin by washing with CHG (chlorhexidine gluconate) soap before surgery. CHG is an antiseptic soap that kills germs and continues to kill germs even after washing.   DO NOT use if you have an allergy to chlorhexidine/CHG or antibacterial soaps. If your skin becomes reddened or irritated, stop using the CHG and notify one of our  RNs at [phone].   Please shower with the CHG soap starting 4 days before surgery using the following schedule:     Please keep in mind the following:  DO NOT shave, including legs and underarms, starting the day of your first shower.   You may shave your face at any point before/day of surgery.  Place clean sheets on your bed the day you start using CHG soap. Use a clean washcloth (not used since being washed) for each shower. DO NOT sleep with pets once you start using the CHG.   CHG Shower Instructions:  If you choose to wash your hair and private area, wash first with your  normal shampoo/soap.  After you use shampoo/soap, rinse your hair and body thoroughly to remove shampoo/soap residue.  Turn the water OFF and apply about 3 tablespoons (45 ml) of CHG soap to a CLEAN washcloth.  Apply CHG soap ONLY FROM YOUR NECK DOWN TO YOUR TOES (washing for 3-5 minutes)  DO NOT use CHG soap on face, private areas, open wounds, or sores.  Pay special attention to the area where your surgery is being performed.  If you are having back surgery, having someone wash your back for you may be helpful. Wait 2 minutes after CHG soap is applied, then you may rinse off the CHG soap.  Pat dry with a clean towel  Put on clean clothes/pajamas   If you choose to wear lotion, please use ONLY the CHG-compatible lotions on the back of this paper.     Additional instructions for the day of surgery: DO NOT APPLY any lotions, deodorants, cologne, or perfumes.   Put on clean/comfortable clothes.  Brush your teeth.  Ask your nurse before applying any prescription medications to the skin.      CHG Compatible Lotions   Aveeno Moisturizing lotion  Cetaphil Moisturizing Cream  Cetaphil Moisturizing Lotion  Clairol Herbal Essence Moisturizing Lotion, Dry Skin  Clairol Herbal Essence Moisturizing Lotion, Extra Dry Skin  Clairol Herbal Essence Moisturizing Lotion, Normal Skin  Curel Age Defying Therapeutic Moisturizing Lotion with Alpha Hydroxy  Curel Extreme Care Body Lotion  Curel Soothing Hands Moisturizing Hand Lotion  Curel Therapeutic Moisturizing Cream, Fragrance-Free  Curel Therapeutic Moisturizing Lotion, Fragrance-Free  Curel Therapeutic Moisturizing Lotion, Original Formula  Eucerin Daily Replenishing Lotion  Eucerin Dry Skin Therapy Plus Alpha Hydroxy Crme  Eucerin Dry Skin Therapy Plus Alpha Hydroxy Lotion  Eucerin Original Crme  Eucerin Original Lotion  Eucerin Plus Crme Eucerin Plus Lotion  Eucerin TriLipid Replenishing Lotion  Keri Anti-Bacterial Hand Lotion  Keri  Deep Conditioning Original Lotion Dry Skin Formula Softly Scented  Keri Deep Conditioning Original Lotion, Fragrance Free Sensitive Skin Formula  Keri Lotion Fast Absorbing Fragrance Free Sensitive Skin Formula  Keri Lotion Fast Absorbing Softly Scented Dry Skin Formula  Keri Original Lotion  Keri Skin Renewal Lotion Keri Silky Smooth Lotion  Keri Silky Smooth Sensitive Skin Lotion  Nivea Body Creamy Conditioning Oil  Nivea Body Extra Enriched Teacher, adult education Moisturizing Lotion Nivea Crme  Nivea Skin Firming Lotion  NutraDerm 30 Skin Lotion  NutraDerm Skin Lotion  NutraDerm Therapeutic Skin Cream  NutraDerm Therapeutic Skin Lotion  ProShield Protective Hand Cream  Provon moisturizing lotion

## 2023-03-20 ENCOUNTER — Encounter (HOSPITAL_COMMUNITY)
Admission: RE | Admit: 2023-03-20 | Discharge: 2023-03-20 | Disposition: A | Discharge status: Home / Self Care | Payer: Medicare PPO | Source: Ambulatory Visit | Attending: Orthopedic Surgery

## 2023-03-20 ENCOUNTER — Other Ambulatory Visit: Payer: Self-pay

## 2023-03-20 ENCOUNTER — Encounter (HOSPITAL_COMMUNITY): Payer: Self-pay

## 2023-03-20 VITALS — BP 120/97 | HR 94 | Temp 98.8°F | Resp 16 | Ht 63.5 in | Wt 141.0 lb

## 2023-03-20 DIAGNOSIS — Z01812 Encounter for preprocedural laboratory examination: Secondary | ICD-10-CM | POA: Insufficient documentation

## 2023-03-20 DIAGNOSIS — Z79899 Other long term (current) drug therapy: Secondary | ICD-10-CM | POA: Insufficient documentation

## 2023-03-20 DIAGNOSIS — M1611 Unilateral primary osteoarthritis, right hip: Secondary | ICD-10-CM | POA: Insufficient documentation

## 2023-03-20 DIAGNOSIS — Z01818 Encounter for other preprocedural examination: Secondary | ICD-10-CM

## 2023-03-20 HISTORY — DX: Unspecified osteoarthritis, unspecified site: M19.90

## 2023-03-20 LAB — TYPE AND SCREEN
ABO/RH(D): A POS
Antibody Screen: NEGATIVE

## 2023-03-20 LAB — BASIC METABOLIC PANEL WITH GFR
Anion gap: 10 (ref 5–15)
BUN: 32 mg/dL — ABNORMAL HIGH (ref 8–23)
CO2: 25 mmol/L (ref 22–32)
Calcium: 9.6 mg/dL (ref 8.9–10.3)
Chloride: 107 mmol/L (ref 98–111)
Creatinine, Ser: 0.95 mg/dL (ref 0.44–1.00)
GFR, Estimated: 59 mL/min — ABNORMAL LOW
Glucose, Bld: 126 mg/dL — ABNORMAL HIGH (ref 70–99)
Potassium: 4.3 mmol/L (ref 3.5–5.1)
Sodium: 142 mmol/L (ref 135–145)

## 2023-03-20 LAB — CBC
HCT: 45.1 % (ref 36.0–46.0)
Hemoglobin: 14.3 g/dL (ref 12.0–15.0)
MCH: 29.8 pg (ref 26.0–34.0)
MCHC: 31.7 g/dL (ref 30.0–36.0)
MCV: 94 fL (ref 80.0–100.0)
Platelets: 209 10*3/uL (ref 150–400)
RBC: 4.8 MIL/uL (ref 3.87–5.11)
RDW: 13 % (ref 11.5–15.5)
WBC: 7.6 10*3/uL (ref 4.0–10.5)
nRBC: 0 % (ref 0.0–0.2)

## 2023-03-20 LAB — SURGICAL PCR SCREEN
MRSA, PCR: NEGATIVE
Staphylococcus aureus: NEGATIVE

## 2023-03-26 NOTE — H&P (Signed)
 TOTAL HIP ADMISSION H&P  Patient is admitted for right total hip arthroplasty.  Therapy Plans: HEP Disposition: Home with comfort keepers Planned DVT Prophylaxis: Eliquis 5 BID DME needed: walker PCP: Dr. Ladona Horns - Cardio: Dr. Royann Shivers - TXA: IV Allergies: NKDA per patient Anesthesia Concerns: none BMI: 25.9 Last HgbA1c: Not diabetic   Other: - staying overnight - eliquis - a fib - No hx of VTE or cancer - tramadol/oxycodone, robaxin, tylenol - Likely some memory impairment, although she recalls some things very well  Subjective:  Chief Complaint: right hip pain  HPI: Anna Mathews, 83 y.o. female, has a history of pain and functional disability in the right hip(s) due to arthritis and patient has failed non-surgical conservative treatments for greater than 12 weeks to include NSAID's and/or analgesics and activity modification.  Onset of symptoms was gradual starting 2 years ago with gradually worsening course since that time.The patient noted no past surgery on the right hip(s).  Patient currently rates pain in the right hip at 8 out of 10 with activity. Patient has worsening of pain with activity and weight bearing, pain that interfers with activities of daily living, and pain with passive range of motion. Patient has evidence of joint space narrowing by imaging studies. This condition presents safety issues increasing the risk of falls.  There is no current active infection.  Patient Active Problem List   Diagnosis Date Noted   ILD (interstitial lung disease) (HCC) 10/11/2021   Abnormal CT of the chest 10/10/2021   Hypercoagulable state due to persistent atrial fibrillation (HCC) 08/04/2021   Alopecia 03/16/2021   Breast cancer in situ 03/16/2021   Carpal tunnel syndrome 03/16/2021   Diverticulitis 03/16/2021   Dysplastic nevus 03/16/2021   Gastro-esophageal reflux disease without esophagitis 03/16/2021   Hyperglycemia 03/16/2021   Loss of appetite 03/16/2021    Paresthesia 03/16/2021   Prediabetes 03/16/2021   Hypertensive urgency 02/04/2021   Labile blood pressure 02/04/2021   TIA (transient ischemic attack) 02/03/2021   Hypercalcemia 11/03/2020   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   Atypical atrial flutter (HCC)    Chest pain 07/02/2017   Hyperlipidemia 07/01/2017   Chondromalacia of left patella 03/20/2017   Tear of lateral meniscus of knee 03/20/2017   Tear of medial meniscus of knee 03/20/2017   Arthritis of left knee 02/21/2017   Pain in left knee 02/21/2017   Anticoagulated 07/08/2015   Long term current use of anticoagulant therapy 06/09/2013   HTN (hypertension) 03/31/2013   Vitamin D deficiency 03/31/2013   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/31/2013   Osteopenia 03/31/2013   Paroxysmal atrial fibrillation (HCC) 11/06/2012   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   Hyperlipidemia    Hypertension    off of blood pressure meds x 2 months- taken off by DR Deboraha Sprang on [address]   Osteopenia    Postmenopausal hormone replacement therapy 1989 - 07/2000    Past Surgical History:  Procedure Laterality Date   BREAST DUCTAL SYSTEM EXCISION Left 06/2002   duct ectasia with fibrosis - atypical lobular hyperplasia with micro calcifications - tamoxifen X 28 months then change to Evista 10/06   BREAST LUMPECTOMY WITH RADIOACTIVE SEED  LOCALIZATION Right 01/27/2020   Procedure: RIGHT BREAST LUMPECTOMY WITH RADIOACTIVE SEED LOCALIZATION;  Surgeon: Abigail Miyamoto, MD;  Location: [facility] OR;  Service: General;  Laterality: Right;   BREAST SURGERY Left 4/99   breast biopsy - fibrosis   BRONCHIAL BIOPSY  10/11/2021   Procedure: BRONCHIAL BIOPSIES;   Surgeon: Martina Sinner, MD;  Location: [facility] ENDOSCOPY;  Service: Pulmonary;;   BRONCHIAL WASHINGS  10/11/2021   Procedure: BRONCHIAL WASHINGS;  Surgeon: Martina Sinner, MD;  Location[facility] ENDOSCOPY;  Service: Pulmonary;;   CARDIOVERSION N/A 07/16/2017   Procedure: CARDIOVERSION;  Surgeon: Jake Bathe, MD;  Location: [facility] ENDOSCOPY;  Service: Cardiovascular;  Laterality: N/A;   CARDIOVERSION N/A 10/20/2018   Procedure: CARDIOVERSION;  Surgeon: Jodelle Red, MD;  Location: [facility] ENDOSCOPY;  Service: Cardiovascular;  Laterality: N/A;   CARDIOVERSION N/A 10/22/2022   Procedure: CARDIOVERSION;  Surgeon: Wendall Stade, MD;  Location: [facility] INVASIVE CV LAB;  Service: Cardiovascular;  Laterality: N/A;   CATARACT EXTRACTION W/ INTRAOCULAR LENS IMPLANT Right    CESAREAN SECTION     X 2   HYSTEROSCOPY  4/95   w/D&C   TEE WITHOUT CARDIOVERSION N/A 10/20/2018   Procedure: TRANSESOPHAGEAL ECHOCARDIOGRAM (TEE);  Surgeon: Jodelle Red, MD;  Location: [facility] ENDOSCOPY;  Service: Cardiovascular;  Laterality: N/A;   VIDEO BRONCHOSCOPY N/A 10/11/2021   Procedure: VIDEO BRONCHOSCOPY WITH FLUORO;  Surgeon: Martina Sinner, MD;  Location: [facility] ENDOSCOPY;  Service: Pulmonary;  Laterality: N/A;    No current facility-administered medications for this encounter.   Current Outpatient Medications  Medication Sig Dispense Refill Last Dose/Taking   acetaminophen (TYLENOL) 500 MG tablet Take 500 mg by mouth every 6 (six) hours as needed for headache or moderate pain.   Taking As Needed   bisoprolol (ZEBETA) 5 MG tablet TAKE 1 TABLET (5 MG TOTAL) BY MOUTH DAILY. 90 tablet 1 Taking   busPIRone (BUSPAR) 15 MG tablet Take 7.5 mg by mouth 2 (two) times daily.   Taking   Calcium Citrate (CITRACAL PO) Take 2 tablets by mouth 2 (two) times daily.   Taking   diltiazem (CARDIZEM) 30 MG tablet Take 30 mg by mouth every 6 (six) hours as needed (AFIB).   Taking As Needed   dronedarone (MULTAQ) 400 MG tablet Take 400  mg by mouth 2 (two) times daily.   Taking   ELIQUIS 5 MG TABS tablet TAKE 1 TABLET BY MOUTH TWICE A DAY 60 tablet 6 Taking   OVER THE COUNTER MEDICATION Take 1 tablet by mouth 2 (two) times daily. Total Beets   Taking   Polyethyl Glycol-Propyl Glycol (LUBRICATING EYE DROPS OP) Place 1 drop into both eyes daily as needed (dry eyes).   Taking As Needed   rosuvastatin (CRESTOR) 5 MG tablet Take 5 mg by mouth daily.   Taking   Allergies  Allergen Reactions   Atorvastatin Other (See Comments)    messed with her liver   Penicillins Other (See Comments)    Itching in the eyes   Ok to take    Amlodipine Itching    Purpura rash   Diltiazem Itching and Rash    burning     Social History   Tobacco Use   Smoking status: Former    Current packs/day: 0.00    Average packs/day: 0.3 packs/day for 12.0 years (3.0 ttl pk-yrs)    Types: Cigarettes    Start date: 01/16/1957    Quit date: 01/23/1968    Years since quitting: 55.2   Smokeless tobacco:  Never   Tobacco comments:    Former smoker 08/04/21  Substance Use Topics   Alcohol use: Never    Family History  Problem Relation Age of Onset   Hypertension Mother    Heart disease Father    COPD Father    Diabetes Maternal Grandfather    Diabetes Son    Rheum arthritis Brother    Kidney cancer Brother    Breast cancer Maternal Grandmother    Diabetes Maternal Aunt    Diabetes Maternal Aunt      Review of Systems  Constitutional:  Negative for chills and fever.  Respiratory:  Negative for cough and shortness of breath.   Cardiovascular:  Negative for chest pain.  Gastrointestinal:  Negative for nausea and vomiting.  Musculoskeletal:  Positive for arthralgias.     Objective:  Physical Exam Constitutional:      Appearance: Normal appearance. She is normal weight.  HENT:     Head: Normocephalic.  Neurological:     Mental Status: She is alert.   Right Hip: Pain with attempts at passive ROM   Vital signs in last 24 hours:     Labs:   Estimated body mass index is 24.59 kg/m as calculated from the following:   Height as of 03/20/23: 5' 3.5" (1.613 m).   Weight as of 03/20/23: 64 kg.   Imaging Review Plain radiographs demonstrate severe degenerative joint disease of the right hip(s). The bone quality appears to be adequate for age and reported activity level.      Assessment/Plan:  End stage arthritis, right hip(s)  The patient history, physical examination, clinical judgement of the provider and imaging studies are consistent with end stage degenerative joint disease of the right hip(s) and total hip arthroplasty is deemed medically necessary. The treatment options including medical management, injection therapy, arthroscopy and arthroplasty were discussed at length. The risks and benefits of total hip arthroplasty were presented and reviewed. The risks due to aseptic loosening, infection, stiffness, dislocation/subluxation,  thromboembolic complications and other imponderables were discussed.  The patient acknowledged the explanation, agreed to proceed with the plan and consent was signed. Patient is being admitted for inpatient treatment for surgery, pain control, PT, OT, prophylactic antibiotics, VTE prophylaxis, progressive ambulation and ADL's and discharge planning.The patient is planning to be discharged  home.   Rosalene Billings, PA-C Orthopedic Surgery [facility] [place] [phone]

## 2023-03-27 NOTE — Progress Notes (Unsigned)
 Virtual Visit via Telephone Note   Because of Anna Mathews co-morbid illnesses, she is at least at moderate risk for complications without adequate follow up.  This format is felt to be most appropriate for this patient at this time.  Due to technical limitations with video connection Web designer), today's appointment will be conducted as an audio only telehealth visit, and Anna Mathews verbally agreed to proceed in this manner.   All issues noted in this document were discussed and addressed.  No physical exam could be performed with this format.  Evaluation Performed:  Preoperative cardiovascular risk assessment _____________   Date:  03/27/2023   Patient ID:  Anna Mathews, DOB 08/04/1940, MRN [id number] Patient Location:  Home Provider location:   Office  Primary Care Provider:  [facility] [place] Primary Cardiologist:  Lance Muss, MD  Chief Complaint / Patient Profile   83 y.o. y/o female with a h/o paroxysmal AF, HLD, HTN, OSA, aortic atherosclerosis who is pending right total hip arthroplasty and presents today for telephonic preoperative cardiovascular risk assessment.  History of Present Illness    Anna Mathews is a 83 y.o. female who presents via audio/video conferencing for a telehealth visit today.  Pt was last seen in cardiology clinic on 01/09/2023 by Dr. Royann Shivers.  At that time Anna Mathews was doing well with excellent blood pressure control and no new cardiac concerns or complaints.  The patient is now pending procedure as outlined above. Since her last visit, she has been doing well with no new cardiac complaints.  She is active and able to complete greater than 4 METS of activity without difficulty.  She denies chest pain, shortness of breath, lower extremity edema, fatigue, palpitations, melena, hematuria, hemoptysis, diaphoresis, weakness, presyncope, syncope, orthopnea, and PND.   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   Hyperlipidemia    Hypertension    off of blood pressure meds x 2 months- taken off by DR Deboraha Sprang on [address]   Osteopenia    Postmenopausal hormone replacement therapy 1989 - 07/2000   Past Surgical History:  Procedure Laterality Date   BREAST DUCTAL SYSTEM EXCISION Left 06/2002   duct ectasia with fibrosis - atypical lobular hyperplasia with micro calcifications - tamoxifen X 28 months then change to Evista 10/06   BREAST LUMPECTOMY WITH RADIOACTIVE SEED LOCALIZATION Right 01/27/2020   Procedure: RIGHT BREAST LUMPECTOMY WITH RADIOACTIVE SEED LOCALIZATION;  Surgeon: Abigail Miyamoto, MD;  Location: [facility] OR;  Service: General;  Laterality: Right;   BREAST SURGERY Left 4/99   breast biopsy - fibrosis   BRONCHIAL BIOPSY  10/11/2021   Procedure: BRONCHIAL BIOPSIES;  Surgeon: Martina Sinner, MD;  Location: [facility] ENDOSCOPY;  Service: Pulmonary;;   BRONCHIAL WASHINGS  10/11/2021   Procedure: BRONCHIAL WASHINGS;  Surgeon: Martina Sinner, MD;  Location: [facility] ENDOSCOPY;  Service: Pulmonary;;   CARDIOVERSION N/A 07/16/2017   Procedure: CARDIOVERSION;  Surgeon: Jake Bathe, MD;  Location: [facility] ENDOSCOPY;  Service: Cardiovascular;  Laterality: N/A;   CARDIOVERSION N/A 10/20/2018   Procedure: CARDIOVERSION;  Surgeon: Jodelle Red, MD;  Location: [facility] ENDOSCOPY;  Service: Cardiovascular;  Laterality: N/A;   CARDIOVERSION N/A 10/22/2022   Procedure: CARDIOVERSION;  Surgeon: Eden Emms,  Noralyn Pick, MD;  Location: [facility] INVASIVE CV LAB;  Service: Cardiovascular;  Laterality: N/A;   CATARACT EXTRACTION W/ INTRAOCULAR LENS IMPLANT Right    CESAREAN SECTION     X 2   HYSTEROSCOPY  4/95   w/D&C   TEE WITHOUT CARDIOVERSION N/A 10/20/2018   Procedure: TRANSESOPHAGEAL ECHOCARDIOGRAM (TEE);   Surgeon: Jodelle Red, MD;  Location: [facility] ENDOSCOPY;  Service: Cardiovascular;  Laterality: N/A;   VIDEO BRONCHOSCOPY N/A 10/11/2021   Procedure: VIDEO BRONCHOSCOPY WITH FLUORO;  Surgeon: Martina Sinner, MD;  Location: [facility] ENDOSCOPY;  Service: Pulmonary;  Laterality: N/A;    Allergies  Allergies  Allergen Reactions   Atorvastatin Other (See Comments)    messed with her liver   Penicillins Other (See Comments)    Itching in the eyes   Ok to take    Amlodipine Itching    Purpura rash   Diltiazem Itching and Rash    burning     Home Medications    Prior to Admission medications   Medication Sig Start Date End Date Taking? Authorizing Provider  acetaminophen (TYLENOL) 500 MG tablet Take 500 mg by mouth every 6 (six) hours as needed for headache or moderate pain.    [provider]  bisoprolol (ZEBETA) 5 MG tablet TAKE 1 TABLET (5 MG TOTAL) BY MOUTH DAILY. 03/14/23   Fenton, Clint R, PA  busPIRone (BUSPAR) 15 MG tablet Take 7.5 mg by mouth 2 (two) times daily. 09/16/21   [provider]  Calcium Citrate (CITRACAL PO) Take 2 tablets by mouth 2 (two) times daily.    [provider]  diltiazem (CARDIZEM) 30 MG tablet Take 30 mg by mouth every 6 (six) hours as needed (AFIB). 02/04/23   [provider]  dronedarone (MULTAQ) 400 MG tablet Take 400 mg by mouth 2 (two) times daily.    [provider]  ELIQUIS 5 MG TABS tablet TAKE 1 TABLET BY MOUTH TWICE A DAY 02/08/23   Fenton, Clint R, PA  OVER THE COUNTER MEDICATION Take 1 tablet by mouth 2 (two) times daily. Total Beets    [provider]  Polyethyl Glycol-Propyl Glycol (LUBRICATING EYE DROPS OP) Place 1 drop into both eyes daily as needed (dry eyes).    [provider]  rosuvastatin (CRESTOR) 5 MG tablet Take 5 mg by mouth daily. 01/05/22   [provider]    Physical Exam    Vital Signs:  Anna Mathews does not have vital signs available for review  today.  Given telephonic nature of communication, physical exam is limited. AAOx3. NAD. Normal affect.  Speech and respirations are unlabored.  Accessory Clinical Findings    None  Assessment & Plan    1.  Preoperative Cardiovascular Risk Assessment: -Patient's RCRI score is 0.9%  The patient affirms she has been doing well without any new cardiac symptoms. They are able to achieve 7 METS without cardiac limitations. Therefore, based on ACC/AHA guidelines, the patient would be at acceptable risk for the planned procedure without further cardiovascular testing. The patient was advised that if she develops new symptoms prior to surgery to contact our office to arrange for a follow-up visit, and she verbalized understanding.   The patient was advised that if she develops new symptoms prior to surgery to contact our office to arrange for a follow-up visit, and she verbalized understanding.  Per protocol patient can hold Eliquis 3 days prior to procedure  A copy of this note will be routed to requesting  Careers adviser.  Time:   Today, I have spent 8 minutes with the patient with telehealth technology discussing medical history, symptoms, and management plan.     Napoleon Form, Leodis Rains, NP  03/27/2023, 2:53 PM

## 2023-03-28 ENCOUNTER — Telehealth: Payer: Self-pay | Admitting: Nurse Practitioner

## 2023-03-28 ENCOUNTER — Ambulatory Visit: Payer: Medicare PPO | Attending: Nurse Practitioner

## 2023-03-28 DIAGNOSIS — Z01810 Encounter for preprocedural cardiovascular examination: Secondary | ICD-10-CM

## 2023-03-28 NOTE — Progress Notes (Addendum)
 Anesthesia Chart Review   Case: [id number] Date/Time: 04/02/23 0815   Procedure: ARTHROPLASTY, HIP, TOTAL, ANTERIOR APPROACH (Right: Hip)   Anesthesia type: Spinal   Pre-op diagnosis: Right hip osteoarthritis   Location: [facility] ROOM 10 / [facility]S   Surgeons: Durene Romans, MD       DISCUSSION:83 y.o. former smoker with h/o HTN, atrial fibrillation, breast cancer, right hip OA scheduled for above procedure 04/02/2023 with Dr. Durene Romans.   Per cardiology preoperative evaluation 03/28/2023, "-Patient's RCRI score is 0.9%   The patient affirms she has been doing well without any new cardiac symptoms. They are able to achieve 7 METS without cardiac limitations. Therefore, based on ACC/AHA guidelines, the patient would be at acceptable risk for the planned procedure without further cardiovascular testing. The patient was advised that if she develops new symptoms prior to surgery to contact our office to arrange for a follow-up visit, and she verbalized understanding.    The patient was advised that if she develops new symptoms prior to surgery to contact our office to arrange for a follow-up visit, and she verbalized understanding.   Per protocol patient can hold Eliquis 3 days prior to procedure"  Pt reports her last dose of Eliquis will be PM dose on 03/29/2023.   VS: BP (!) 120/97   Pulse 94   Temp 37.1 C (Oral)   Resp 16   Ht 5' 3.5" (1.613 m)   Wt 64 kg   LMP 01/23/1988 (Approximate)   SpO2 97%   BMI 24.59 kg/m   PROVIDERS: [facility] [place]   LABS: Labs reviewed: Acceptable for surgery. (all labs ordered are listed, but only abnormal results are displayed)  Labs Reviewed  BASIC METABOLIC PANEL - Abnormal; Notable for the following components:      Result Value   Glucose, Bld 126 (*)    BUN 32 (*)    GFR, Estimated 59 (*)    All other components within normal limits  SURGICAL PCR SCREEN  CBC  TYPE AND SCREEN     IMAGES:   EKG:   CV: Echo  02/04/2021  1. Left ventricular ejection fraction, by estimation, is 60 to 65%. The  left ventricle has normal function. The left ventricle has no regional  wall motion abnormalities. There is mild asymmetric left ventricular  hypertrophy of the basal-septal segment.  Left ventricular diastolic parameters are indeterminate.   2. Right ventricular systolic function is normal. The right ventricular  size is normal. There is normal pulmonary artery systolic pressure. The  estimated right ventricular systolic pressure is 26.4 mmHg.   3. Left atrial size was moderately dilated.   4. The mitral valve is degenerative. Trivial mitral valve regurgitation.  Moderate to severe mitral annular calcification.   5. The aortic valve is tricuspid. There is mild calcification of the  aortic valve. There is mild thickening of the aortic valve. Aortic valve  regurgitation is not visualized. Aortic valve sclerosis/calcification is  present, without any evidence of  aortic stenosis.   6. The inferior vena cava is normal in size with greater than 50%  respiratory variability, suggesting right atrial pressure of 3 mmHg.   Comparison(s): No significant change from prior study.  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  Hyperlipidemia    Hypertension    off of blood pressure meds x 2 months- taken off by DR Deboraha Sprang on [address]   Osteopenia    Postmenopausal hormone replacement therapy 1989 - 07/2000    Past Surgical History:  Procedure Laterality Date   BREAST DUCTAL SYSTEM EXCISION Left 06/2002   duct ectasia with fibrosis - atypical lobular hyperplasia with micro calcifications - tamoxifen X 28 months then change to Evista 10/06   BREAST LUMPECTOMY WITH RADIOACTIVE SEED LOCALIZATION Right 01/27/2020    Procedure: RIGHT BREAST LUMPECTOMY WITH RADIOACTIVE SEED LOCALIZATION;  Surgeon: Abigail Miyamoto, MD;  Location: [facility] OR;  Service: General;  Laterality: Right;   BREAST SURGERY Left 4/99   breast biopsy - fibrosis   BRONCHIAL BIOPSY  10/11/2021   Procedure: BRONCHIAL BIOPSIES;  Surgeon: Martina Sinner, MD;  Location: [facility] ENDOSCOPY;  Service: Pulmonary;;   BRONCHIAL WASHINGS  10/11/2021   Procedure: BRONCHIAL WASHINGS;  Surgeon: Martina Sinner, MD;  Location: [facility] ENDOSCOPY;  Service: Pulmonary;;   CARDIOVERSION N/A 07/16/2017   Procedure: CARDIOVERSION;  Surgeon: Jake Bathe, MD;  Location: [facility] ENDOSCOPY;  Service: Cardiovascular;  Laterality: N/A;   CARDIOVERSION N/A 10/20/2018   Procedure: CARDIOVERSION;  Surgeon: Jodelle Red, MD;  Location: [facility] ENDOSCOPY;  Service: Cardiovascular;  Laterality: N/A;   CARDIOVERSION N/A 10/22/2022   Procedure: CARDIOVERSION;  Surgeon: Wendall Stade, MD;  Location: [facility] INVASIVE CV LAB;  Service: Cardiovascular;  Laterality: N/A;   CATARACT EXTRACTION W/ INTRAOCULAR LENS IMPLANT Right    CESAREAN SECTION     X 2   HYSTEROSCOPY  4/95   w/D&C   TEE WITHOUT CARDIOVERSION N/A 10/20/2018   Procedure: TRANSESOPHAGEAL ECHOCARDIOGRAM (TEE);  Surgeon: Jodelle Red, MD;  Location: [facility] ENDOSCOPY;  Service: Cardiovascular;  Laterality: N/A;   VIDEO BRONCHOSCOPY N/A 10/11/2021   Procedure: VIDEO BRONCHOSCOPY WITH FLUORO;  Surgeon: Martina Sinner, MD;  Location: [facility] ENDOSCOPY;  Service: Pulmonary;  Laterality: N/A;    MEDICATIONS:  acetaminophen (TYLENOL) 500 MG tablet   bisoprolol (ZEBETA) 5 MG tablet   busPIRone (BUSPAR) 15 MG tablet   Calcium Citrate (CITRACAL PO)   diltiazem (CARDIZEM) 30 MG tablet   dronedarone (MULTAQ) 400 MG tablet   ELIQUIS 5 MG TABS tablet   OVER THE COUNTER MEDICATION   Polyethyl Glycol-Propyl Glycol (LUBRICATING EYE DROPS OP)   rosuvastatin (CRESTOR) 5 MG tablet   No current facility-administered medications  for this encounter.   Jodell Cipro Ward, PA-C [facility] [phone]

## 2023-03-28 NOTE — Telephone Encounter (Signed)
 Call placed this morning to complete preoperative clearance televisit for patient's upcoming hip replacement procedure.  Patient was not available at designated time of 10:00 AM and alternate contact was called and was unable to contact patient as well.  Detailed voicemail was left on patient's cell with instructions to call back at earliest convenience.  Robin Searing, NP

## 2023-03-29 NOTE — Anesthesia Preprocedure Evaluation (Addendum)
 Anesthesia Evaluation  Patient identified by MRN, date of birth, ID band Patient awake    Reviewed: Allergy & Precautions, NPO status , Patient's Chart, lab work & pertinent test results, reviewed documented beta blocker date and time   History of Anesthesia Complications Negative for: history of anesthetic complications  Airway Mallampati: I  TM Distance: >3 FB Neck ROM: Full    Dental  (+) Dental Advisory Given, Teeth Intact   Pulmonary former smoker   Pulmonary exam normal        Cardiovascular hypertension, Pt. on home beta blockers and Pt. on medications Normal cardiovascular exam+ dysrhythmias Atrial Fibrillation    '23 TTE - EF 60 to 65%. There is mild asymmetric left ventricular hypertrophy of the basal-septal segment. LA size was moderately dilated. Trivial MR.     Neuro/Psych TIA negative psych ROS   GI/Hepatic Neg liver ROS, hiatal hernia,GERD  Controlled,,  Endo/Other  negative endocrine ROS    Renal/GU negative Renal ROS     Musculoskeletal  (+) Arthritis ,    Abdominal   Peds  Hematology  On eliquis    Anesthesia Other Findings   Reproductive/Obstetrics  Breast cancer                               Anesthesia Physical Anesthesia Plan  ASA: 3  Anesthesia Plan: Spinal   Post-op Pain Management: Tylenol PO (pre-op)*   Induction:   PONV Risk Score and Plan: 2 and Treatment may vary due to age or medical condition and Propofol infusion  Airway Management Planned: Natural Airway and Simple Face Mask  Additional Equipment: None  Intra-op Plan:   Post-operative Plan:   Informed Consent: I have reviewed the patients History and Physical, chart, labs and discussed the procedure including the risks, benefits and alternatives for the proposed anesthesia with the patient or authorized representative who has indicated his/her understanding and acceptance.       Plan  Discussed with: CRNA and Anesthesiologist  Anesthesia Plan Comments: (See PAT note 03/20/2023)        Anesthesia Quick Evaluation

## 2023-04-02 ENCOUNTER — Other Ambulatory Visit: Payer: Self-pay

## 2023-04-02 ENCOUNTER — Ambulatory Visit (HOSPITAL_COMMUNITY): Payer: Self-pay | Admitting: Physician Assistant

## 2023-04-02 ENCOUNTER — Ambulatory Visit (HOSPITAL_COMMUNITY)

## 2023-04-02 ENCOUNTER — Encounter (HOSPITAL_COMMUNITY): Payer: Self-pay | Admitting: Orthopedic Surgery

## 2023-04-02 ENCOUNTER — Ambulatory Visit (HOSPITAL_BASED_OUTPATIENT_CLINIC_OR_DEPARTMENT_OTHER): Payer: Self-pay | Admitting: Physician Assistant

## 2023-04-02 ENCOUNTER — Encounter (HOSPITAL_COMMUNITY)
Admission: RE | Disposition: A | Discharge status: Home / Self Care | Payer: Self-pay | Source: Home / Self Care | Attending: Orthopedic Surgery

## 2023-04-02 ENCOUNTER — Observation Stay (HOSPITAL_COMMUNITY)
Admission: RE | Admit: 2023-04-02 | Discharge: 2023-04-03 | Disposition: A | Discharge status: Home / Self Care | Payer: Medicare PPO | Attending: Orthopedic Surgery | Admitting: Orthopedic Surgery

## 2023-04-02 ENCOUNTER — Observation Stay (HOSPITAL_COMMUNITY)

## 2023-04-02 DIAGNOSIS — Z853 Personal history of malignant neoplasm of breast: Secondary | ICD-10-CM | POA: Diagnosis not present

## 2023-04-02 DIAGNOSIS — I10 Essential (primary) hypertension: Secondary | ICD-10-CM | POA: Insufficient documentation

## 2023-04-02 DIAGNOSIS — I4891 Unspecified atrial fibrillation: Secondary | ICD-10-CM | POA: Diagnosis not present

## 2023-04-02 DIAGNOSIS — M1611 Unilateral primary osteoarthritis, right hip: Principal | ICD-10-CM | POA: Insufficient documentation

## 2023-04-02 DIAGNOSIS — Z8673 Personal history of transient ischemic attack (TIA), and cerebral infarction without residual deficits: Secondary | ICD-10-CM | POA: Insufficient documentation

## 2023-04-02 DIAGNOSIS — Z79899 Other long term (current) drug therapy: Secondary | ICD-10-CM | POA: Insufficient documentation

## 2023-04-02 DIAGNOSIS — Z7901 Long term (current) use of anticoagulants: Secondary | ICD-10-CM | POA: Insufficient documentation

## 2023-04-02 DIAGNOSIS — Z96641 Presence of right artificial hip joint: Secondary | ICD-10-CM

## 2023-04-02 DIAGNOSIS — Z87891 Personal history of nicotine dependence: Secondary | ICD-10-CM | POA: Insufficient documentation

## 2023-04-02 DIAGNOSIS — I4819 Other persistent atrial fibrillation: Secondary | ICD-10-CM | POA: Insufficient documentation

## 2023-04-02 DIAGNOSIS — Z01818 Encounter for other preprocedural examination: Secondary | ICD-10-CM

## 2023-04-02 HISTORY — PX: TOTAL HIP ARTHROPLASTY: SHX124

## 2023-04-02 LAB — ABO/RH: ABO/RH(D): A POS

## 2023-04-02 SURGERY — ARTHROPLASTY, HIP, TOTAL, ANTERIOR APPROACH
Anesthesia: Spinal | Site: Hip | Laterality: Right

## 2023-04-02 MED ORDER — BUSPIRONE HCL 5 MG PO TABS
7.5000 mg | ORAL_TABLET | Freq: Two times a day (BID) | ORAL | Status: DC
  Administered 2023-04-02 – 2023-04-03 (×3): 7.5 mg via ORAL
  Filled 2023-04-02 (×3): qty 2

## 2023-04-02 MED ORDER — SENNA 8.6 MG PO TABS
2.0000 | ORAL_TABLET | Freq: Every day | ORAL | Status: DC
  Administered 2023-04-02: 17.2 mg via ORAL
  Filled 2023-04-02: qty 2

## 2023-04-02 MED ORDER — POLYETHYLENE GLYCOL 3350 17 G PO PACK
17.0000 g | PACK | Freq: Two times a day (BID) | ORAL | Status: DC
  Administered 2023-04-02 – 2023-04-03 (×2): 17 g via ORAL
  Filled 2023-04-02 (×3): qty 1

## 2023-04-02 MED ORDER — ONDANSETRON HCL 4 MG/2ML IJ SOLN
INTRAMUSCULAR | Status: DC | PRN
  Administered 2023-04-02: 4 mg via INTRAVENOUS

## 2023-04-02 MED ORDER — LIDOCAINE HCL (PF) 2 % IJ SOLN
INTRAMUSCULAR | Status: DC | PRN
  Administered 2023-04-02: 40 mg via INTRADERMAL

## 2023-04-02 MED ORDER — ORAL CARE MOUTH RINSE: 15.0000 mL | Freq: Once | OROMUCOSAL | Status: AC

## 2023-04-02 MED ORDER — ONDANSETRON HCL 4 MG PO TABS: 4.0000 mg | ORAL_TABLET | Freq: Four times a day (QID) | ORAL | Status: DC | PRN

## 2023-04-02 MED ORDER — KETOROLAC TROMETHAMINE 30 MG/ML IJ SOLN
INTRAMUSCULAR | Status: DC | PRN
  Administered 2023-04-02: 30 mg

## 2023-04-02 MED ORDER — FENTANYL CITRATE (PF) 100 MCG/2ML IJ SOLN
INTRAMUSCULAR | Status: DC | PRN
  Administered 2023-04-02: 50 ug via INTRAVENOUS

## 2023-04-02 MED ORDER — PROPOFOL 500 MG/50ML IV EMUL
INTRAVENOUS | Status: DC | PRN
Start: 2023-04-02 — End: 2023-04-02
  Administered 2023-04-02: 60 ug/kg/min via INTRAVENOUS

## 2023-04-02 MED ORDER — PHENOL 1.4 % MT LIQD: 1.0000 | OROMUCOSAL | Status: DC | PRN

## 2023-04-02 MED ORDER — TRANEXAMIC ACID-NACL 1000-0.7 MG/100ML-% IV SOLN
1000.0000 mg | INTRAVENOUS | Status: AC
  Administered 2023-04-02: 1000 mg via INTRAVENOUS
  Filled 2023-04-02: qty 100

## 2023-04-02 MED ORDER — PROPOFOL 1000 MG/100ML IV EMUL
INTRAVENOUS | Status: AC
  Filled 2023-04-02: qty 100

## 2023-04-02 MED ORDER — PHENYLEPHRINE 80 MCG/ML (10ML) SYRINGE FOR IV PUSH (FOR BLOOD PRESSURE SUPPORT)
PREFILLED_SYRINGE | INTRAVENOUS | Status: DC | PRN
  Administered 2023-04-02 (×2): 80 ug via INTRAVENOUS

## 2023-04-02 MED ORDER — TRANEXAMIC ACID-NACL 1000-0.7 MG/100ML-% IV SOLN
1000.0000 mg | Freq: Once | INTRAVENOUS | Status: AC
  Administered 2023-04-02: 1000 mg via INTRAVENOUS
  Filled 2023-04-02: qty 100

## 2023-04-02 MED ORDER — OXYCODONE HCL 5 MG PO TABS
2.5000 mg | ORAL_TABLET | ORAL | Status: DC | PRN
  Administered 2023-04-02 – 2023-04-03 (×3): 5 mg via ORAL
  Filled 2023-04-02 (×4): qty 1

## 2023-04-02 MED ORDER — BUPIVACAINE-EPINEPHRINE (PF) 0.25% -1:200000 IJ SOLN
INTRAMUSCULAR | Status: AC
  Filled 2023-04-02: qty 30

## 2023-04-02 MED ORDER — METHOCARBAMOL 1000 MG/10ML IJ SOLN: 500.0000 mg | Freq: Four times a day (QID) | INTRAMUSCULAR | Status: DC | PRN

## 2023-04-02 MED ORDER — LACTATED RINGERS IV SOLN: INTRAVENOUS | Status: DC

## 2023-04-02 MED ORDER — PHENYLEPHRINE HCL-NACL 20-0.9 MG/250ML-% IV SOLN
INTRAVENOUS | Status: DC | PRN
Start: 2023-04-02 — End: 2023-04-02
  Administered 2023-04-02: 30 ug/min via INTRAVENOUS

## 2023-04-02 MED ORDER — PROPOFOL 10 MG/ML IV BOLUS
INTRAVENOUS | Status: AC
  Filled 2023-04-02: qty 20

## 2023-04-02 MED ORDER — DRONEDARONE HCL 400 MG PO TABS
400.0000 mg | ORAL_TABLET | Freq: Two times a day (BID) | ORAL | Status: DC
  Administered 2023-04-02 – 2023-04-03 (×3): 400 mg via ORAL
  Filled 2023-04-02 (×3): qty 1

## 2023-04-02 MED ORDER — SODIUM CHLORIDE 0.9% FLUSH: 3.0000 mL | Freq: Two times a day (BID) | INTRAVENOUS | Status: DC

## 2023-04-02 MED ORDER — BISOPROLOL FUMARATE 5 MG PO TABS
5.0000 mg | ORAL_TABLET | Freq: Once | ORAL | Status: AC
  Administered 2023-04-02: 5 mg via ORAL
  Filled 2023-04-02: qty 1

## 2023-04-02 MED ORDER — BUPIVACAINE-EPINEPHRINE (PF) 0.25% -1:200000 IJ SOLN
INTRAMUSCULAR | Status: DC | PRN
  Administered 2023-04-02: 30 mL via PERINEURAL

## 2023-04-02 MED ORDER — FENTANYL CITRATE (PF) 100 MCG/2ML IJ SOLN
INTRAMUSCULAR | Status: AC
  Filled 2023-04-02: qty 2

## 2023-04-02 MED ORDER — DIPHENHYDRAMINE HCL 12.5 MG/5ML PO ELIX: 12.5000 mg | ORAL_SOLUTION | ORAL | Status: DC | PRN

## 2023-04-02 MED ORDER — DEXAMETHASONE SODIUM PHOSPHATE 10 MG/ML IJ SOLN
INTRAMUSCULAR | Status: AC
  Filled 2023-04-02: qty 1

## 2023-04-02 MED ORDER — ONDANSETRON HCL 4 MG/2ML IJ SOLN
INTRAMUSCULAR | Status: AC
  Filled 2023-04-02: qty 2

## 2023-04-02 MED ORDER — METHOCARBAMOL 500 MG PO TABS
500.0000 mg | ORAL_TABLET | Freq: Four times a day (QID) | ORAL | Status: DC | PRN
  Administered 2023-04-02 – 2023-04-03 (×4): 500 mg via ORAL
  Filled 2023-04-02 (×4): qty 1

## 2023-04-02 MED ORDER — SODIUM CHLORIDE 0.9% FLUSH
3.0000 mL | Freq: Two times a day (BID) | INTRAVENOUS | Status: DC
  Administered 2023-04-03: 10 mL via INTRAVENOUS

## 2023-04-02 MED ORDER — STERILE WATER FOR IRRIGATION IR SOLN
Status: DC | PRN
  Administered 2023-04-02: 1000 mL

## 2023-04-02 MED ORDER — DEXAMETHASONE SODIUM PHOSPHATE 10 MG/ML IJ SOLN
10.0000 mg | Freq: Once | INTRAMUSCULAR | Status: AC
  Administered 2023-04-03: 10 mg via INTRAVENOUS
  Filled 2023-04-02: qty 1

## 2023-04-02 MED ORDER — METOCLOPRAMIDE HCL 5 MG/ML IJ SOLN: 5.0000 mg | Freq: Three times a day (TID) | INTRAMUSCULAR | Status: DC | PRN

## 2023-04-02 MED ORDER — BUPIVACAINE IN DEXTROSE 0.75-8.25 % IT SOLN
INTRATHECAL | Status: DC | PRN
  Administered 2023-04-02: 1.6 mL via INTRATHECAL

## 2023-04-02 MED ORDER — ONDANSETRON HCL 4 MG/2ML IJ SOLN: 4.0000 mg | Freq: Once | INTRAMUSCULAR | Status: DC | PRN

## 2023-04-02 MED ORDER — LIDOCAINE HCL (PF) 2 % IJ SOLN
INTRAMUSCULAR | Status: AC
Start: 2023-04-02 — End: ?
  Filled 2023-04-02: qty 5

## 2023-04-02 MED ORDER — OXYCODONE HCL 5 MG/5ML PO SOLN: 5.0000 mg | Freq: Once | ORAL | Status: DC | PRN

## 2023-04-02 MED ORDER — ROSUVASTATIN CALCIUM 5 MG PO TABS
5.0000 mg | ORAL_TABLET | Freq: Every day | ORAL | Status: DC
  Administered 2023-04-02 – 2023-04-03 (×2): 5 mg via ORAL
  Filled 2023-04-02 (×2): qty 1

## 2023-04-02 MED ORDER — CEFAZOLIN SODIUM-DEXTROSE 2-4 GM/100ML-% IV SOLN
2.0000 g | Freq: Four times a day (QID) | INTRAVENOUS | Status: AC
  Administered 2023-04-02 (×2): 2 g via INTRAVENOUS
  Filled 2023-04-02 (×2): qty 100

## 2023-04-02 MED ORDER — POVIDONE-IODINE 10 % EX SWAB: 2.0000 | Freq: Once | CUTANEOUS | Status: DC

## 2023-04-02 MED ORDER — ONDANSETRON HCL 4 MG/2ML IJ SOLN: 4.0000 mg | Freq: Four times a day (QID) | INTRAMUSCULAR | Status: DC | PRN

## 2023-04-02 MED ORDER — DEXAMETHASONE SODIUM PHOSPHATE 10 MG/ML IJ SOLN: 8.0000 mg | Freq: Once | INTRAMUSCULAR | Status: DC

## 2023-04-02 MED ORDER — PROPOFOL 10 MG/ML IV BOLUS
INTRAVENOUS | Status: DC | PRN
  Administered 2023-04-02: 30 mg via INTRAVENOUS

## 2023-04-02 MED ORDER — APIXABAN 5 MG PO TABS
5.0000 mg | ORAL_TABLET | Freq: Two times a day (BID) | ORAL | Status: DC
  Administered 2023-04-03: 5 mg via ORAL
  Filled 2023-04-02: qty 1

## 2023-04-02 MED ORDER — KETOROLAC TROMETHAMINE 30 MG/ML IJ SOLN
INTRAMUSCULAR | Status: AC
  Filled 2023-04-02: qty 1

## 2023-04-02 MED ORDER — MENTHOL 3 MG MT LOZG: 1.0000 | LOZENGE | OROMUCOSAL | Status: DC | PRN

## 2023-04-02 MED ORDER — SODIUM CHLORIDE 0.9% FLUSH: 3.0000 mL | INTRAVENOUS | Status: DC | PRN

## 2023-04-02 MED ORDER — SODIUM CHLORIDE (PF) 0.9 % IJ SOLN
INTRAMUSCULAR | Status: DC | PRN
  Administered 2023-04-02: 30 mL

## 2023-04-02 MED ORDER — ALUM & MAG HYDROXIDE-SIMETH 200-200-20 MG/5ML PO SUSP: 30.0000 mL | ORAL | Status: DC | PRN

## 2023-04-02 MED ORDER — METOCLOPRAMIDE HCL 5 MG PO TABS: 5.0000 mg | ORAL_TABLET | Freq: Three times a day (TID) | ORAL | Status: DC | PRN

## 2023-04-02 MED ORDER — CHLORHEXIDINE GLUCONATE 0.12 % MT SOLN
15.0000 mL | Freq: Once | OROMUCOSAL | Status: AC
  Administered 2023-04-02: 15 mL via OROMUCOSAL

## 2023-04-02 MED ORDER — CEFAZOLIN SODIUM-DEXTROSE 2-4 GM/100ML-% IV SOLN
2.0000 g | INTRAVENOUS | Status: AC
  Administered 2023-04-02: 2 g via INTRAVENOUS
  Filled 2023-04-02: qty 100

## 2023-04-02 MED ORDER — BISACODYL 10 MG RE SUPP: 10.0000 mg | Freq: Every day | RECTAL | Status: DC | PRN

## 2023-04-02 MED ORDER — HYDROMORPHONE HCL 1 MG/ML IJ SOLN
0.5000 mg | INTRAMUSCULAR | Status: DC | PRN
  Filled 2023-04-02: qty 0.5

## 2023-04-02 MED ORDER — OXYCODONE HCL 5 MG PO TABS: 5.0000 mg | ORAL_TABLET | Freq: Once | ORAL | Status: DC | PRN

## 2023-04-02 MED ORDER — FENTANYL CITRATE PF 50 MCG/ML IJ SOSY: 25.0000 ug | PREFILLED_SYRINGE | INTRAMUSCULAR | Status: DC | PRN

## 2023-04-02 MED ORDER — ACETAMINOPHEN 500 MG PO TABS
1000.0000 mg | ORAL_TABLET | Freq: Once | ORAL | Status: AC
  Administered 2023-04-02: 1000 mg via ORAL
  Filled 2023-04-02: qty 2

## 2023-04-02 MED ORDER — 0.9 % SODIUM CHLORIDE (POUR BTL) OPTIME
TOPICAL | Status: DC | PRN
  Administered 2023-04-02: 1000 mL

## 2023-04-02 MED ORDER — SODIUM CHLORIDE (PF) 0.9 % IJ SOLN
INTRAMUSCULAR | Status: AC
  Filled 2023-04-02: qty 10

## 2023-04-02 MED ORDER — TRAMADOL HCL 50 MG PO TABS
50.0000 mg | ORAL_TABLET | Freq: Four times a day (QID) | ORAL | Status: DC | PRN
Start: 2023-04-02 — End: 2023-04-03

## 2023-04-02 MED ORDER — ACETAMINOPHEN 500 MG PO TABS
1000.0000 mg | ORAL_TABLET | Freq: Four times a day (QID) | ORAL | Status: DC
Start: 2023-04-02 — End: 2023-04-06
  Administered 2023-04-02 – 2023-04-03 (×4): 1000 mg via ORAL
  Filled 2023-04-02 (×5): qty 2

## 2023-04-02 MED ORDER — BISOPROLOL FUMARATE 5 MG PO TABS
5.0000 mg | ORAL_TABLET | Freq: Every day | ORAL | Status: DC
  Filled 2023-04-02: qty 1

## 2023-04-02 SURGICAL SUPPLY — 42 items
BAG COUNTER SPONGE SURGICOUNT (BAG) IMPLANT
BAG ZIPLOCK 12X15 (MISCELLANEOUS) IMPLANT
BLADE SAG 18X100X1.27 (BLADE) ×1 IMPLANT
COVER PERINEAL POST (MISCELLANEOUS) ×1 IMPLANT
COVER SURGICAL LIGHT HANDLE (MISCELLANEOUS) ×1 IMPLANT
CUP ACET PINNACLE SECTR 50MM (Hips) IMPLANT
DERMABOND ADVANCED .7 DNX12 (GAUZE/BANDAGES/DRESSINGS) ×1 IMPLANT
DRAPE FOOT SWITCH (DRAPES) ×1 IMPLANT
DRAPE STERI IOBAN 125X83 (DRAPES) ×1 IMPLANT
DRAPE U-SHAPE 47X51 STRL (DRAPES) ×2 IMPLANT
DRESSING AQUACEL AG SP 3.5X10 (GAUZE/BANDAGES/DRESSINGS) ×1 IMPLANT
DRSG AQUACEL AG ADV 3.5X10 (GAUZE/BANDAGES/DRESSINGS) IMPLANT
DRSG AQUACEL AG SP 3.5X10 (GAUZE/BANDAGES/DRESSINGS) ×1 IMPLANT
DURAPREP 26ML APPLICATOR (WOUND CARE) ×1 IMPLANT
ELECT REM PT RETURN 15FT ADLT (MISCELLANEOUS) ×1 IMPLANT
FEM STEM 12/14 TAPER SZ 4 HIP (Orthopedic Implant) ×1 IMPLANT
FEMORAL STEM 12/14 TPR SZ4 HIP (Orthopedic Implant) IMPLANT
GLOVE BIO SURGEON STRL SZ 6 (GLOVE) ×1 IMPLANT
GLOVE BIOGEL PI IND STRL 6.5 (GLOVE) ×1 IMPLANT
GLOVE BIOGEL PI IND STRL 7.5 (GLOVE) ×1 IMPLANT
GLOVE ORTHO TXT STRL SZ7.5 (GLOVE) ×2 IMPLANT
GOWN STRL REUS W/ TWL LRG LVL3 (GOWN DISPOSABLE) ×2 IMPLANT
HEAD FEM STD 32X+5 STRL (Hips) IMPLANT
HOLDER FOLEY CATH W/STRAP (MISCELLANEOUS) ×1 IMPLANT
KIT TURNOVER KIT A (KITS) IMPLANT
LINER ACET PNNCL PLUS4 NEUTRAL (Hips) IMPLANT
MANIFOLD NEPTUNE II (INSTRUMENTS) ×1 IMPLANT
NDL SAFETY ECLIPSE 18X1.5 (NEEDLE) IMPLANT
PACK ANTERIOR HIP CUSTOM (KITS) ×1 IMPLANT
PINNACLE PLUS 4 NEUTRAL (Hips) ×1 IMPLANT
PINNACLE SECTOR CUP 50MM (Hips) ×1 IMPLANT
SCREW 6.5MMX30MM (Screw) IMPLANT
SUT MNCRL AB 4-0 PS2 18 (SUTURE) ×1 IMPLANT
SUT STRATAFIX 0 PDS 27 VIOLET (SUTURE) ×1 IMPLANT
SUT VIC AB 1 CT1 36 (SUTURE) ×3 IMPLANT
SUT VIC AB 2-0 CT1 TAPERPNT 27 (SUTURE) ×2 IMPLANT
SUTURE STRATFX 0 PDS 27 VIOLET (SUTURE) ×1 IMPLANT
SYR 3ML LL SCALE MARK (SYRINGE) IMPLANT
TOWEL GREEN STERILE FF (TOWEL DISPOSABLE) ×1 IMPLANT
TRAY FOLEY MTR SLVR 16FR STAT (SET/KITS/TRAYS/PACK) ×1 IMPLANT
TUBE SUCTION HIGH CAP CLEAR NV (SUCTIONS) ×1 IMPLANT
WATER STERILE IRR 1000ML POUR (IV SOLUTION) ×1 IMPLANT

## 2023-04-02 NOTE — Care Plan (Signed)
 Ortho Bundle Case Management Note  Patient Details  Name: Anna Mathews MRN: [id number] Date of Birth: [birth year]   RT THA on 04-02-23  DCP:  Home with [employer] x 48 hrs p/o DME:  RW ordered through [employer] PT:  HEP                   DME Arranged:  Walker rolling DME Agency:  [employer]  HH Arranged:    HH Agency:  NA  Additional Comments: Please contact me with any questions of if this plan should need to change.  Aida Raider, Case Manager [facility]  [phone] 04/02/2023, 9:17 AM

## 2023-04-02 NOTE — Anesthesia Procedure Notes (Signed)
 Spinal  Patient location during procedure: OR Start time: 04/02/2023 8:41 AM Reason for block: surgical anesthesia Staffing Performed: resident/CRNA  Anesthesiologist: Beryle Lathe, MD Resident/CRNA: Sindy Guadeloupe, CRNA Performed by: Sindy Guadeloupe, CRNA Authorized by: Beryle Lathe, MD   Preanesthetic Checklist Completed: patient identified, IV checked, site marked, risks and benefits discussed, surgical consent, monitors and equipment checked, pre-op evaluation and timeout performed Spinal Block Patient position: sitting Prep: DuraPrep and site prepped and draped Patient monitoring: continuous pulse ox, blood pressure, cardiac monitor and heart rate Approach: midline Location: L3-4 Injection technique: single-shot Needle Needle type: Pencan  Needle gauge: 24 G Needle length: 10 cm Assessment Events: CSF return Additional Notes Pt placed in sitting position, spinal kit expiration date checked and verified, timeout performed by Dr Mal Amabile, + CSF, - heme, pt tolerated well. Dr Mal Amabile present and supervising throughout SAB placement.

## 2023-04-02 NOTE — Evaluation (Signed)
 Physical Therapy Evaluation Patient Details Name: Anna Mathews MRN: [id number] DOB: 23-Oct-1940 Today's Date: 04/02/2023  History of Present Illness  Pt is 83 yo female admitted 04/02/23 for R anterior THA. Pt with hx including but not limited to ILD, pre diabetic, HTN, Labile BP, TIA, arthritis, osteopenia, pAFIB, breast CA  Clinical Impression  Pt is s/p THA resulting in the deficits listed below (see PT Problem List). At baseline, pt lives alone and is independent.  She has arranged for 24 hr support at d/c and has borrowed a RW.  Today, pt reports pain at 8/10 but no signs of severe pain and was premedicated. She needed light min A for transfers and ambulated 55' with RW and CGA.  Pt expected to progress well with therapy.  Reports plan for return home with HEP, no further therapy scheduled.  Pt will benefit from acute skilled PT to increase their independence and safety with mobility to facilitate discharge.          If plan is discharge home, recommend the following: A little help with walking and/or transfers;A little help with bathing/dressing/bathroom;Assistance with cooking/housework;Help with stairs or ramp for entrance   Can travel by private vehicle        Equipment Recommendations None recommended by PT  Recommendations for Other Services       Functional Status Assessment Patient has had a recent decline in their functional status and demonstrates the ability to make significant improvements in function in a reasonable and predictable amount of time.     Precautions / Restrictions Precautions Precautions: Fall Restrictions RLE Weight Bearing Per Provider Order: Weight bearing as tolerated      Mobility  Bed Mobility Overal bed mobility: Needs Assistance Bed Mobility: Supine to Sit     Supine to sit: Min assist          Transfers Overall transfer level: Needs assistance Equipment used: Rolling walker (2 wheels) Transfers: Sit to/from Stand Sit to Stand:  Contact guard assist           General transfer comment: Cues for hand placement and R LE managment    Ambulation/Gait Ambulation/Gait assistance: Contact guard assist Gait Distance (Feet): 50 Feet Assistive device: Rolling walker (2 wheels) Gait Pattern/deviations: Decreased stride length, Step-to pattern Gait velocity: decreased     General Gait Details: cues for RW proximity and sequencing with good carryover  Stairs            Wheelchair Mobility     Tilt Bed    Modified Rankin (Stroke Patients Only)       Balance Overall balance assessment: Needs assistance Sitting-balance support: No upper extremity supported Sitting balance-Leahy Scale: Good     Standing balance support: Bilateral upper extremity supported, Reliant on assistive device for balance Standing balance-Leahy Scale: Poor Standing balance comment: steady with RW                             Pertinent Vitals/Pain Pain Assessment Pain Assessment: 0-10 Pain Score: 8  Pain Location: R hip Pain Descriptors / Indicators: Discomfort Pain Intervention(s): Limited activity within patient's tolerance, Monitored during session, Premedicated before session, Repositioned, Ice applied, Other (comment) (Pt smiling, no signs of severe pain)    Home Living Family/patient expects to be discharged to:: Private residence Living Arrangements: Alone Available Help at Discharge: Neighbor;Family;Available 24 hours/day (Daughter has set up PCA assist for a few days after surgery) Type of Home: Other(Comment) (condo)  Home Access: Stairs to enter Entrance Stairs-Rails: None Entrance Stairs-Number of Steps: 1 curb and 1 threshold   Home Layout: One level Home Equipment: Cane - single point Additional Comments: has borrowed RW    Prior Function Prior Level of Function : Independent/Modified Independent             Mobility Comments: Was using cane but otherwise independent with community  ambulation; likes to be active ADLs Comments: independent adls and iadls     Extremity/Trunk Assessment   Upper Extremity Assessment Upper Extremity Assessment: Overall WFL for tasks assessed    Lower Extremity Assessment Lower Extremity Assessment: LLE deficits/detail;RLE deficits/detail RLE Deficits / Details: Expected post op changes; ROM WFL; MMT: ankle 5/5, knee 3/5 not further tested, hip 1/5 LLE Deficits / Details: ROM WFL; MMT 5/5    Cervical / Trunk Assessment Cervical / Trunk Assessment: Normal  Communication        Cognition Arousal: Alert Behavior During Therapy: WFL for tasks assessed/performed   PT - Cognitive impairments: No apparent impairments                                 Cueing       General Comments General comments (skin integrity, edema, etc.): VSS    Exercises Total Joint Exercises Ankle Circles/Pumps: AROM, Both, 10 reps, Supine   Assessment/Plan    PT Assessment Patient needs continued PT services  PT Problem List Decreased strength;Pain;Decreased range of motion;Decreased activity tolerance;Decreased knowledge of use of DME;Decreased balance;Decreased mobility       PT Treatment Interventions DME instruction;Therapeutic exercise;Gait training;Stair training;Functional mobility training;Therapeutic activities;Patient/family education;Modalities    PT Goals (Current goals can be found in the Care Plan section)  Acute Rehab PT Goals Patient Stated Goal: return home PT Goal Formulation: With patient/family Time For Goal Achievement: 04/16/23 Potential to Achieve Goals: Good    Frequency 7X/week     Co-evaluation               AM-PAC PT "6 Clicks" Mobility  Outcome Measure Help needed turning from your back to your side while in a flat bed without using bedrails?: A Little Help needed moving from lying on your back to sitting on the side of a flat bed without using bedrails?: A Little Help needed moving to and  from a bed to a chair (including a wheelchair)?: A Little Help needed standing up from a chair using your arms (e.g., wheelchair or bedside chair)?: A Little Help needed to walk in hospital room?: A Little Help needed climbing 3-5 steps with a railing? : A Lot 6 Click Score: 17    End of Session Equipment Utilized During Treatment: Gait belt Activity Tolerance: Patient tolerated treatment well Patient left: with chair alarm set;in chair;with call bell/phone within reach;with SCD's reapplied Nurse Communication: Mobility status PT Visit Diagnosis: Other abnormalities of gait and mobility (R26.89);Muscle weakness (generalized) (M62.81)    Time: 1610-9604 PT Time Calculation (min) (ACUTE ONLY): 25 min   Charges:   PT Evaluation $PT Eval Low Complexity: 1 Low PT Treatments $Gait Training: 8-22 mins PT General Charges $$ ACUTE PT VISIT: 1 Visit         Anise Salvo, PT Acute Rehab [facility] [phone]   Rayetta Humphrey 04/02/2023, 3:17 PM

## 2023-04-02 NOTE — Op Note (Signed)
 NAME:  [name]                ACCOUNT NO.: [id number]      MEDICAL RECORD NO.: [id number]      FACILITY:  [facility]      PHYSICIAN:  Shelda Pal  DATE OF BIRTH:  18-Jun-1940     DATE OF PROCEDURE:  04/02/2023                                 OPERATIVE REPORT         PREOPERATIVE DIAGNOSIS: Right  hip osteoarthritis.      POSTOPERATIVE DIAGNOSIS:  Right hip osteoarthritis.      PROCEDURE:  Right total hip replacement through an anterior approach   utilizing DePuy THR system, component size 50 mm pinnacle cup, a size 32+4 neutral   Altrex liner, a size 4 Hi Actis stem with a 32+5 Articuleze metal head ball.      SURGEON:  Madlyn Frankel. Charlann Boxer, M.D.      ASSISTANT:  Rosalene Billings, PA-C     ANESTHESIA:  Spinal.      SPECIMENS:  None.      COMPLICATIONS:  None.      BLOOD LOSS:  200 cc     DRAINS:  None.      INDICATION OF THE PROCEDURE:  Anna Mathews is a 83 y.o. female who had   presented to office for evaluation of right hip pain.  Radiographs revealed   progressive degenerative changes with bone-on-bone   articulation of the  hip joint, including subchondral cystic changes and osteophytes.  The patient had painful limited range of   motion significantly affecting their overall quality of life and function.  The patient was failing to    respond to conservative measures including medications and/or injections and activity modification and at this point was ready   to proceed with more definitive measures.  Consent was obtained for   benefit of pain relief.  Specific risks of infection, DVT, component   failure, dislocation, neurovascular injury, and need for revision surgery were reviewed in the office.     PROCEDURE IN DETAIL:  The patient was brought to operative theater.   Once adequate anesthesia, preoperative antibiotics, 2 gm of Ancef, 1 gm of Tranexamic Acid, and 10 mg of Decadron were administered, the patient was positioned supine on the  [employer] table.  Once the patient was safely positioned with adequate padding of boney prominences we predraped out the hip, and used fluoroscopy to confirm orientation of the pelvis.      The right hip was then prepped and draped from proximal iliac crest to   mid thigh with a shower curtain technique.      Time-out was performed identifying the patient, planned procedure, and the appropriate extremity.     An incision was then made 2 cm lateral to the   anterior superior iliac spine extending over the orientation of the   tensor fascia lata muscle and sharp dissection was carried down to the   fascia of the muscle.      The fascia was then incised.  The muscle belly was identified and swept   laterally and retractor placed along the superior neck.  Following   cauterization of the circumflex vessels and removing some pericapsular   fat, a second cobra retractor was placed on the inferior neck.  A T-capsulotomy was made along the line of the   superior neck to the trochanteric fossa, then extended proximally and   distally.  Tag sutures were placed and the retractors were then placed   intracapsular.  We then identified the trochanteric fossa and   orientation of my neck cut and then made a neck osteotomy with the femur on traction.  The femoral   head was removed without difficulty or complication.  Traction was let   off and retractors were placed posterior and anterior around the   acetabulum.      The labrum and foveal tissue were debrided.  I began reaming with a 45 mm   reamer and reamed up to 49 mm reamer with good bony bed preparation and a 50 mm  cup was chosen.  The final 50 mm Pinnacle cup was then impacted under fluoroscopy to confirm the depth of penetration and orientation with respect to   Abduction and forward flexion.  A screw was placed into the ilium followed by the hole eliminator.  The final   32+4 neutral Altrex liner was impacted with good visualized rim fit.  The  cup was positioned anatomically within the acetabular portion of the pelvis.      At this point, the femur was rolled to 100 degrees.  Further capsule was   released off the inferior aspect of the femoral neck.  I then   released the superior capsule proximally.  With the leg in a neutral position the hook was placed laterally   along the femur under the vastus lateralis origin and elevated manually and then held in position using the hook attachment on the bed.  The leg was then extended and adducted with the leg rolled to 100   degrees of external rotation.  Retractors were placed along the medial calcar and posteriorly over the greater trochanter.  Once the proximal femur was fully   exposed, I used a box osteotome to set orientation.  I then began   broaching with the starting chili pepper broach and passed this by hand and then broached up to 4.  With the 4 broach in place I chose a high offset neck and did several trial reductions.  The offset was appropriate, leg lengths   appeared to be equal best matched with the +5 head ball trial confirmed radiographically.   Given these findings, I went ahead and dislocated the hip, repositioned all   retractors and positioned the right hip in the extended and abducted position.  The final 4 Hi Actis stem was   chosen and it was impacted down to the level of neck cut.  Based on this   and the trial reductions, a final 32+5 Articuleze metal head ball was chosen and   impacted onto a clean and dry trunnion, and the hip was reduced.  The   hip had been irrigated throughout the case again at this point.  I did   reapproximate the superior capsular leaflet to the anterior leaflet   using #1 Vicryl.  The fascia of the   tensor fascia lata muscle was then reapproximated using #1 Vicryl and #0 Stratafix sutures.  The   remaining wound was closed with 2-0 Vicryl and running 4-0 Monocryl.   The hip was cleaned, dried, and dressed sterilely using Dermabond and    Aquacel dressing.  The patient was then brought   to recovery room in stable condition tolerating the procedure well.    Rosalene Billings,  PA-C was present for the entirety of the case involved from   preoperative positioning, perioperative retractor management, general   facilitation of the case, as well as primary wound closure as assistant.            Madlyn Frankel Charlann Boxer, M.D.        04/02/2023 7:02 AM

## 2023-04-02 NOTE — Interval H&P Note (Signed)
 History and Physical Interval Note:  04/02/2023 7:02 AM  Anna Mathews  has presented today for surgery, with the diagnosis of Right hip osteoarthritis.  The various methods of treatment have been discussed with the patient and family. After consideration of risks, benefits and other options for treatment, the patient has consented to  Procedure(s): ARTHROPLASTY, HIP, TOTAL, ANTERIOR APPROACH (Right) as a surgical intervention.  [name] history has been reviewed, patient examined, no change in status, stable for surgery.  I have reviewed the patient's chart and labs.  Questions were answered to the patient's satisfaction.     Shelda Pal

## 2023-04-02 NOTE — Anesthesia Postprocedure Evaluation (Signed)
 Anesthesia Post Note  Patient: Anna Mathews  Procedure(s) Performed: ARTHROPLASTY, HIP, TOTAL, ANTERIOR APPROACH (Right: Hip)     Patient location during evaluation: PACU Anesthesia Type: Spinal Level of consciousness: awake and alert Pain management: pain level controlled Vital Signs Assessment: post-procedure vital signs reviewed and stable Respiratory status: spontaneous breathing and respiratory function stable Cardiovascular status: blood pressure returned to baseline and stable Postop Assessment: spinal receding and no apparent nausea or vomiting Anesthetic complications: no   No notable events documented.  Last Vitals:  Vitals:   04/02/23 1130 04/02/23 1234  BP: (!) 143/70 (!) 149/77  Pulse: (!) 49 67  Resp: 13 18  Temp:  36.6 C  SpO2: 100% 99%                  Beryle Lathe

## 2023-04-02 NOTE — Anesthesia Procedure Notes (Signed)
 Procedure Name: MAC Date/Time: 04/02/2023 8:35 AM  Performed by: Sindy Guadeloupe, CRNAPre-anesthesia Checklist: Patient identified, Emergency Drugs available, Suction available, Patient being monitored and Timeout performed Oxygen Delivery Method: Simple face mask Placement Confirmation: positive ETCO2

## 2023-04-02 NOTE — Discharge Instructions (Signed)

## 2023-04-02 NOTE — Transfer of Care (Signed)
 Immediate Anesthesia Transfer of Care Note  Patient: Anna Mathews  Procedure(s) Performed: ARTHROPLASTY, HIP, TOTAL, ANTERIOR APPROACH (Right: Hip)  Patient Location: PACU  Anesthesia Type:Spinal  Level of Consciousness: awake, drowsy, and patient cooperative  Airway & Oxygen Therapy: Patient Spontanous Breathing and Patient connected to face mask oxygen  Post-op Assessment: Report given to RN and Post -op Vital signs reviewed and stable  Post vital signs: Reviewed and stable  Last Vitals:  Vitals Value Taken Time  BP 99/38 04/02/23 1008  Temp    Pulse 62 04/02/23 1010  Resp 13 04/02/23 1010  SpO2 98 % 04/02/23 1010  Vitals shown include unfiled device data.  Last Pain:  Vitals:   04/02/23 0708  TempSrc:   PainSc: 2          Complications: No notable events documented.

## 2023-04-03 ENCOUNTER — Encounter (HOSPITAL_COMMUNITY): Payer: Self-pay | Admitting: Orthopedic Surgery

## 2023-04-03 ENCOUNTER — Other Ambulatory Visit (HOSPITAL_COMMUNITY): Payer: Self-pay

## 2023-04-03 DIAGNOSIS — M1611 Unilateral primary osteoarthritis, right hip: Secondary | ICD-10-CM | POA: Diagnosis not present

## 2023-04-03 LAB — CBC
HCT: 36.1 % (ref 36.0–46.0)
Hemoglobin: 11.1 g/dL — ABNORMAL LOW (ref 12.0–15.0)
MCH: 29.6 pg (ref 26.0–34.0)
MCHC: 30.7 g/dL (ref 30.0–36.0)
MCV: 96.3 fL (ref 80.0–100.0)
Platelets: 163 10*3/uL (ref 150–400)
RBC: 3.75 MIL/uL — ABNORMAL LOW (ref 3.87–5.11)
RDW: 13.4 % (ref 11.5–15.5)
WBC: 7.9 10*3/uL (ref 4.0–10.5)
nRBC: 0 % (ref 0.0–0.2)

## 2023-04-03 LAB — BASIC METABOLIC PANEL
Anion gap: 7 (ref 5–15)
BUN: 35 mg/dL — ABNORMAL HIGH (ref 8–23)
CO2: 23 mmol/L (ref 22–32)
Calcium: 8.4 mg/dL — ABNORMAL LOW (ref 8.9–10.3)
Chloride: 109 mmol/L (ref 98–111)
Creatinine, Ser: 0.91 mg/dL (ref 0.44–1.00)
GFR, Estimated: >60 mL/min (ref >60)
Glucose, Bld: 133 mg/dL — ABNORMAL HIGH (ref 70–99)
Potassium: 4.4 mmol/L (ref 3.5–5.1)
Sodium: 139 mmol/L (ref 135–145)

## 2023-04-03 MED ORDER — ORAL CARE MOUTH RINSE: 15.0000 mL | OROMUCOSAL | Status: DC | PRN

## 2023-04-03 MED ORDER — POLYETHYLENE GLYCOL 3350 17 GM/SCOOP PO POWD
17.0000 g | Freq: Two times a day (BID) | ORAL | 0 refills | Status: AC
  Filled 2023-04-03: qty 238, 7d supply, fill #0

## 2023-04-03 MED ORDER — SENNA 8.6 MG PO TABS
2.0000 | ORAL_TABLET | Freq: Every day | ORAL | 0 refills | Status: AC
  Filled 2023-04-03: qty 28, 14d supply, fill #0

## 2023-04-03 NOTE — TOC Transition Note (Signed)
 Transition of Care [facility]) - Discharge Note   Patient Details  Name: Anna Mathews MRN: [id number] Date of Birth: [birth year]  [facility] CM/SW Contact:  Howell Rucks, RN Phone Number: 04/03/2023, 10:51 AM   Clinical Narrative:   Met with pt at bedside to review dc therapy and home DME needs, pt confirmed HEP, [employer] x 48 hours, [employer] to provide RW. No TOC needs.     Final next level of care: Home/Self Care Barriers to Discharge: No Barriers Identified   Patient Goals and CMS Choice Patient states their goals for this hospitalization and ongoing recovery are:: return home          Discharge Placement                       Discharge Plan and Services Additional resources added to the After Visit Summary for                  DME Arranged: Walker rolling DME Agency: [employer]         HH Agency: NA        Social Drivers of Health (SDOH) Interventions SDOH Screenings   Food Insecurity: No Food Insecurity (04/02/2023)  Housing: Low Risk  (04/02/2023)  Transportation Needs: No Transportation Needs (04/02/2023)  Utilities: Not At Risk (04/02/2023)  Financial Resource Strain: Low Risk  (03/24/2022)   Received from [facility], [facility]  Social Connections: Moderately Integrated (04/02/2023)  Tobacco Use: Medium Risk (04/02/2023)     Readmission Risk Interventions     No data to display

## 2023-04-03 NOTE — Progress Notes (Signed)
 Physical Therapy Treatment Patient Details Name: Anna Mathews MRN: [id number] DOB: [birth year] Today's Date: 04/03/2023   History of Present Illness Pt is 83 yo female admitted 04/02/23 for R anterior THA. Pt with hx including but not limited to ILD, pre diabetic, HTN, Labile BP, TIA, arthritis, osteopenia, pAFIB, breast CA    PT Comments  Pt progressing with therapy today.  She did need min A with supine/sit and some repeated cues for transfer techniques and exercises.  Pt ambulating 37' with RW and CGA.  Plan to f/u in afternoon to further advance mobility and HEP.  Pt likely will progress to level that is safe to return home today with supervision.    If plan is discharge home, recommend the following: A little help with walking and/or transfers;A little help with bathing/dressing/bathroom;Assistance with cooking/housework;Help with stairs or ramp for entrance   Can travel by private vehicle        Equipment Recommendations  None recommended by PT    Recommendations for Other Services       Precautions / Restrictions Precautions Precautions: Fall Restrictions RLE Weight Bearing Per Provider Order: Weight bearing as tolerated     Mobility  Bed Mobility Overal bed mobility: Needs Assistance Bed Mobility: Supine to Sit     Supine to sit: Min assist     General bed mobility comments: increased time, assist for R LE and to lift trunk    Transfers Overall transfer level: Needs assistance Equipment used: Rolling walker (2 wheels) Transfers: Sit to/from Stand Sit to Stand: Contact guard assist           General transfer comment: Cues for hand placement and R LE managment    Ambulation/Gait Ambulation/Gait assistance: Contact guard assist Gait Distance (Feet): 80 Feet Assistive device: Rolling walker (2 wheels) Gait Pattern/deviations: Step-to pattern Gait velocity: decreased     General Gait Details: cues for RW proximity   Stairs              Wheelchair Mobility     Tilt Bed    Modified Rankin (Stroke Patients Only)       Balance Overall balance assessment: Needs assistance Sitting-balance support: No upper extremity supported Sitting balance-Leahy Scale: Good     Standing balance support: Bilateral upper extremity supported, Reliant on assistive device for balance Standing balance-Leahy Scale: Poor Standing balance comment: steady with RW                            Communication    Cognition Arousal: Alert Behavior During Therapy: WFL for tasks assessed/performed   PT - Cognitive impairments: Memory                       PT - Cognition Comments: Did require some repetition on transfer techniques and exercises        Cueing    Exercises Total Joint Exercises Ankle Circles/Pumps: AROM, Both, 10 reps, Supine Quad Sets: AROM, Both, 10 reps, Supine Heel Slides: AAROM, Right, 10 reps, Supine Hip ABduction/ADduction: AAROM, Right, 10 reps, Supine Long Arc Quad: AROM, Right, 10 reps, Seated Other Exercises Other Exercises: Educated on AAROM techniques as needed    General Comments        Pertinent Vitals/Pain Pain Assessment Pain Assessment: 0-10 Pain Score: 1  Pain Location: R hip Pain Descriptors / Indicators: Discomfort Pain Intervention(s): Limited activity within patient's tolerance, Monitored during session, Premedicated before session, Repositioned  Home Living                          Prior Function            PT Goals (current goals can now be found in the care plan section) Progress towards PT goals: Progressing toward goals    Frequency    7X/week      PT Plan      Co-evaluation              AM-PAC PT "6 Clicks" Mobility   Outcome Measure  Help needed turning from your back to your side while in a flat bed without using bedrails?: A Little Help needed moving from lying on your back to sitting on the side of a flat bed without  using bedrails?: A Little Help needed moving to and from a bed to a chair (including a wheelchair)?: A Little Help needed standing up from a chair using your arms (e.g., wheelchair or bedside chair)?: A Little Help needed to walk in hospital room?: A Little Help needed climbing 3-5 steps with a railing? : A Lot 6 Click Score: 17    End of Session Equipment Utilized During Treatment: Gait belt Activity Tolerance: Patient tolerated treatment well Patient left: with chair alarm set;in chair;with call bell/phone within reach;with SCD's reapplied Nurse Communication: Mobility status PT Visit Diagnosis: Other abnormalities of gait and mobility (R26.89);Muscle weakness (generalized) (M62.81)     Time: 1020-1049 PT Time Calculation (min) (ACUTE ONLY): 29 min  Charges:    $Gait Training: 8-22 mins $Therapeutic Exercise: 8-22 mins PT General Charges $$ ACUTE PT VISIT: 1 Visit                     Anise Salvo, PT Acute Rehab Services [facility] [phone]    Rayetta Humphrey 04/03/2023, 2:08 PM

## 2023-04-03 NOTE — Plan of Care (Signed)

## 2023-04-03 NOTE — Progress Notes (Signed)
 Medications delivered from [facility] outpatient pharmacy by this RN

## 2023-04-03 NOTE — Care Management Obs Status (Signed)
 MEDICARE OBSERVATION STATUS NOTIFICATION   Patient Details  Name: Anna Mathews MRN: [id number] Date of Birth: 1940/02/02   Medicare Observation Status Notification Given:  Yes    Howell Rucks, RN 04/03/2023, 9:32 AM

## 2023-04-03 NOTE — Progress Notes (Addendum)
 Physical Therapy Treatment Patient Details Name: Anna Mathews MRN: [id number] DOB: 20-Mar-1940 Today's Date: 04/03/2023   History of Present Illness Pt is 83 yo female admitted 04/02/23 for R anterior THA. Pt with hx including but not limited to ILD, pre diabetic, HTN, Labile BP, TIA, arthritis, osteopenia, pAFIB, breast CA    PT Comments  Pt is POD # 1 and is progressing well.  She was able to increase gait to 120' with supervision and does not have steps at home.  Went through [employer] written HEP and provided further written instruction as needed.  Pt providing teach back and demonstrating exercises.  Daughter was present later - checked in and daughter/pt with no further questions.  Pt demonstrates safe gait & transfers in order to return home from PT perspective once discharged by MD.  While in hospital, will continue to benefit from PT for skilled therapy to advance mobility and exercises.       If plan is discharge home, recommend the following: A little help with walking and/or transfers;A little help with bathing/dressing/bathroom;Assistance with cooking/housework;Help with stairs or ramp for entrance   Can travel by private vehicle        Equipment Recommendations  None recommended by PT    Recommendations for Other Services       Precautions / Restrictions Precautions Precautions: Fall Restrictions RLE Weight Bearing Per Provider Order: Weight bearing as tolerated     Mobility  Bed Mobility Overal bed mobility: Needs Assistance Bed Mobility: Supine to Sit     Supine to sit: Min assist     General bed mobility comments: In chair; started to practice supine/sit but pt reports sleeps in chair with ottoman    Transfers Overall transfer level: Needs assistance Equipment used: Rolling walker (2 wheels) Transfers: Sit to/from Stand Sit to Stand: Supervision           General transfer comment: STS x 4 during session, good carry over for hand placement and lowering  self controlled    Ambulation/Gait Ambulation/Gait assistance: Supervision Gait Distance (Feet): 120 Feet Assistive device: Rolling walker (2 wheels) Gait Pattern/deviations: Step-to pattern, Step-through pattern Gait velocity: decreased     General Gait Details: Good recall of RW proximity; progressed from step to to step through pattern   Stairs             Wheelchair Mobility     Tilt Bed    Modified Rankin (Stroke Patients Only)       Balance Overall balance assessment: Needs assistance Sitting-balance support: No upper extremity supported Sitting balance-Leahy Scale: Good     Standing balance support: Bilateral upper extremity supported, Reliant on assistive device for balance, No upper extremity supported Standing balance-Leahy Scale: Fair Standing balance comment: Ambulating with RW but can stand without UE support                            Communication    Cognition Arousal: Alert Behavior During Therapy: WFL for tasks assessed/performed   PT - Cognitive impairments: Memory                       PT - Cognition Comments: Improvement with transfer technique but did need some repetition for exercise instruction/progression - provided written HEP        Cueing    Exercises Total Joint Exercises Ankle Circles/Pumps: AROM, Both, 10 reps, Supine Quad Sets: AROM, Both, 10 reps, Supine Heel  Slides: AAROM, Right, 10 reps, Supine Hip ABduction/ADduction: AAROM, Right, 10 reps, Supine Long Arc Quad: AROM, Right, 10 reps, Seated Other Exercises Other Exercises: Standing holding counter AROM 5 reps, R LE: hip flex, hip ext, hip abd, hamstring curl    General Comments   Educated on safe ice use, no pivots, car transfers, and TED hose during day. Also, encouraged walking every 1-2 hours during day. Educated on HEP with focus on mobility the first weeks. Discussed doing exercises within pain control and if pain increasing could decreased  ROM, reps, and stop exercises as needed. Encouraged to perform ankle pumps frequently for blood flow.  Pt did ask if she would get further therapy in future - discussed MD protocol and that typically pt's with hip replacements progress well with HEP and walking, but if still concerned could check with MD at followup.     Pertinent Vitals/Pain Pain Assessment Pain Assessment: 0-10 Pain Score: 1  Pain Location: R hip Pain Descriptors / Indicators: Discomfort Pain Intervention(s): Limited activity within patient's tolerance, Monitored during session, Repositioned, Ice applied    Home Living                          Prior Function            PT Goals (current goals can now be found in the care plan section) Progress towards PT goals: Progressing toward goals    Frequency    7X/week      PT Plan      Co-evaluation              AM-PAC PT "6 Clicks" Mobility   Outcome Measure  Help needed turning from your back to your side while in a flat bed without using bedrails?: A Little Help needed moving from lying on your back to sitting on the side of a flat bed without using bedrails?: A Little Help needed moving to and from a bed to a chair (including a wheelchair)?: A Little Help needed standing up from a chair using your arms (e.g., wheelchair or bedside chair)?: A Little Help needed to walk in hospital room?: A Little Help needed climbing 3-5 steps with a railing? : A Little 6 Click Score: 18    End of Session Equipment Utilized During Treatment: Gait belt Activity Tolerance: Patient tolerated treatment well Patient left: with chair alarm set;in chair;with call bell/phone within reach Nurse Communication: Mobility status PT Visit Diagnosis: Other abnormalities of gait and mobility (R26.89);Muscle weakness (generalized) (M62.81)     Time: 0981-1914 PT Time Calculation (min) (ACUTE ONLY): 26 min  Charges:    $Gait Training: 8-22 mins $Therapeutic  Exercise: 8-22 mins PT General Charges $$ ACUTE PT VISIT: 1 Visit                     Anise Salvo, PT Acute Rehab [facility] [phone]    Rayetta Humphrey 04/03/2023, 3:34 PM

## 2023-04-03 NOTE — Progress Notes (Addendum)
   Subjective: 1 Day Post-Op Procedure(s) (LRB): ARTHROPLASTY, HIP, TOTAL, ANTERIOR APPROACH (Right) Patient reports pain as mild.   Patient seen in rounds for Dr. Charlann Boxer. Patient is resting in bed on exam this morning. No acute events overnight. Foley catheter removed. Patient ambulated 50 feet with PT yesterday. We will start therapy today.   Objective: Vital signs in last 24 hours: Temp:  [96.2 F (35.7 C)-98 F (36.7 C)] 97.7 F (36.5 C) (03/12 0515) Pulse Rate:  [34-103] 83 (03/12 0515) Resp:  [13-18] 16 (03/12 0515) BP: (94-149)/(38-86) 118/72 (03/12 0515) SpO2:  [92 %-100 %] 96 % (03/12 0515)  Intake/Output from previous day:  Intake/Output Summary (Last 24 hours) at 04/03/2023 0901 Last data filed at 04/03/2023 1610 Gross per 24 hour  Intake 1183.3 ml  Output 1260 ml  Net -76.7 ml     Intake/Output this shift: No intake/output data recorded.  Labs: Recent Labs    04/03/23 0354  HGB 11.1*   Recent Labs    04/03/23 0354  WBC 7.9  RBC 3.75*  HCT 36.1  PLT 163   Recent Labs    04/03/23 0354  NA 139  K 4.4  CL 109  CO2 23  BUN 35*  CREATININE 0.91  GLUCOSE 133*  CALCIUM 8.4*   No results for input(s): "LABPT", "INR" in the last 72 hours.  Exam: General - Patient is Alert and Appropriate Extremity - Neurologically intact Sensation intact distally Intact pulses distally Dorsiflexion/Plantar flexion intact Dressing - dressing C/D/I Motor Function - intact, moving foot and toes well on exam.   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   Weight as of this encounter: 64 kg. Advance diet Up with therapy D/C IV fluids  DVT Prophylaxis - Eliquis Weight bearing as tolerated.  Hgb stable at 11 this AM  Dr. Charlann Boxer sent tramadol and methocarbamol ahead of time, patient picked up on 3/7  Plan is to go Home after hospital stay. Plan for discharge today after meeting goals with therapy. Follow up in the office in 2 weeks.   Rosalene Billings, PA-C Orthopedic Surgery [phone] 04/03/2023, 9:01 AM

## 2023-04-11 NOTE — Discharge Summary (Signed)
 Patient ID: Anna Mathews MRN: [id number] DOB/AGE: November 23, 1940 83 y.o.  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 female who was admitted 04/02/2023 for operative treatment ofS/P total right hip arthroplasty. Patient has severe unremitting pain that affects sleep, daily activities, and work/hobbies. After pre-op clearance the patient was taken to the operating room on 04/02/2023 and underwent  Procedure(s): ARTHROPLASTY, HIP, TOTAL, ANTERIOR APPROACH.    Patient was given perioperative antibiotics:  Anti-infectives (From admission, onward)    Start     Dose/Rate Route Frequency Ordered Stop   04/02/23 1500  ceFAZolin (ANCEF) IVPB 2g/100 mL premix        2 g 200 mL/hr over 30 Minutes Intravenous Every 6 hours 04/02/23 1241 04/02/23 2258   04/02/23 0700  ceFAZolin (ANCEF) IVPB 2g/100 mL premix        2 g 200 mL/hr over 30 Minutes Intravenous On call to O.R. 04/02/23 2952 04/02/23 0855        Patient was given sequential compression devices, early ambulation, and chemoprophylaxis to prevent DVT. Patient worked with PT and was meeting their goals regarding safe ambulation and transfers.  Patient benefited maximally from hospital stay and  there were no complications.    Recent vital signs: No data found.   Recent laboratory studies: No results for input(s): "WBC", "HGB", "HCT", "PLT", "NA", "K", "CL", "CO2", "BUN", "CREATININE", "GLUCOSE", "INR", "CALCIUM" in the last 72 hours.  Invalid input(s): "PT", "2"   Discharge Medications:   Allergies as of 04/03/2023       Reactions   Atorvastatin Other (See Comments)   messed with her liver   Penicillins Other (See Comments)   Itching in the eyes   Ok to take    Amlodipine Itching   Purpura rash   Diltiazem Itching, Rash   burning         Medication List     TAKE these medications    acetaminophen 500 MG tablet Commonly known as: TYLENOL Take 500 mg by mouth every 6 (six) hours as needed for headache or moderate pain.   bisoprolol 5 MG tablet Commonly known as: ZEBETA TAKE 1 TABLET (5 MG TOTAL) BY MOUTH DAILY.   busPIRone 15 MG tablet Commonly known as: BUSPAR Take 7.5 mg by mouth 2 (two) times daily.   CITRACAL PO Take 2 tablets by mouth 2 (two) times daily.   diltiazem 30 MG tablet Commonly known as: CARDIZEM Take 30 mg by mouth every 6 (six) hours as needed (AFIB).   dronedarone 400 MG tablet Commonly known as: MULTAQ Take 400 mg by mouth 2 (two) times daily.   Eliquis 5 MG Tabs tablet Generic drug: apixaban TAKE 1 TABLET BY MOUTH TWICE A DAY  LUBRICATING EYE DROPS OP Place 1 drop into both eyes daily as needed (dry eyes).   OVER THE COUNTER MEDICATION Take 1 tablet by mouth 2 (two) times daily. Total Beets   polyethylene glycol powder 17 GM/SCOOP powder Commonly known as: GLYCOLAX/MIRALAX Dissolve 17 g in liquid and take by mouth 2 (two) times daily.   rosuvastatin 5 MG tablet Commonly known as: CRESTOR Take 5 mg by mouth daily.   senna 8.6 MG Tabs tablet Commonly known as: SENOKOT Take 2 tablets (17.2 mg total) by mouth at bedtime for 14 days.               Discharge Care Instructions  (From admission, onward)            Start     Ordered   04/03/23 0000  Change dressing       Comments: Maintain surgical dressing until follow up in the clinic. If the edges start to pull up, may reinforce with tape. If the dressing is no longer working, may remove and cover with gauze and tape, but must keep the area dry and clean.  Call with any questions or concerns.   04/03/23 0905            Diagnostic Studies: DG Pelvis Portable Result Date: 04/02/2023 CLINICAL DATA:  Status post hip arthroplasty. EXAM: PORTABLE PELVIS 1-2 VIEWS COMPARISON:  None Available. FINDINGS: Right hip arthroplasty in expected alignment. No periprosthetic lucency or fracture. Recent postsurgical change includes air and edema in the soft tissues. IMPRESSION: Right hip arthroplasty without immediate postoperative complication. Electronically Signed   By: Narda Rutherford M.D.   On: 04/02/2023 11:05   DG HIP UNILAT WITH PELVIS 1V RIGHT Result Date: 04/02/2023 CLINICAL DATA:  Elective surgery. EXAM: DG HIP (WITH OR WITHOUT PELVIS) 1V RIGHT COMPARISON:  None Available. FINDINGS: Two fluoroscopic spot views of the pelvis and right hip obtained in the operating room. Images during hip arthroplasty. Fluoroscopy time 11 seconds. Dose 1.3 mGy. IMPRESSION: Intraoperative fluoroscopy during right hip arthroplasty. Electronically Signed   By: Narda Rutherford M.D.   On: 04/02/2023 11:04   DG C-Arm 1-60 Min-No Report Result Date: 04/02/2023 Fluoroscopy was utilized by the requesting physician.  No radiographic interpretation.    Disposition: Discharge disposition: 01-Home or Self Care       Discharge Instructions     Call MD / Call 911   Complete by: As directed    If you experience chest pain or shortness of breath, CALL 911 and be transported to the hospital emergency room.  If you develope a fever above 101 F, pus (white drainage) or increased drainage or redness at the wound, or calf pain, call your surgeon's office.   Change dressing    Complete by: As directed    Maintain surgical dressing until follow up in the clinic. If the edges start to pull up, may reinforce with tape. If the dressing is no longer working, may remove and cover with gauze and tape, but must keep the area dry and clean.  Call with any questions or concerns.   Constipation Prevention   Complete by: As directed    Drink plenty of fluids.  Prune juice may be helpful.  You may use a stool softener, such as Colace (over the counter) 100 mg twice a day.  Use MiraLax (over the counter) for constipation as needed.   Diet - low sodium heart healthy   Complete by: As directed    Increase activity slowly as  tolerated   Complete by: As directed    Weight bearing as tolerated with assist device (walker, cane, etc) as directed, use it as long as suggested by your surgeon or therapist, typically at least 4-6 weeks.   Post-operative opioid taper instructions:   Complete by: As directed    POST-OPERATIVE OPIOID TAPER INSTRUCTIONS: It is important to wean off of your opioid medication as soon as possible. If you do not need pain medication after your surgery it is ok to stop day one. Opioids include: Codeine, Hydrocodone(Norco, Vicodin), Oxycodone(Percocet, oxycontin) and hydromorphone amongst others.  Long term and even short term use of opiods can cause: Increased pain response Dependence Constipation Depression Respiratory depression And more.  Withdrawal symptoms can include Flu like symptoms Nausea, vomiting And more Techniques to manage these symptoms Hydrate well Eat regular healthy meals Stay active Use relaxation techniques(deep breathing, meditating, yoga) Do Not substitute Alcohol to help with tapering If you have been on opioids for less than two weeks and do not have pain than it is ok to stop all together.  Plan to wean off of opioids This plan should start within one week post op of your joint replacement. Maintain the same interval or time  between taking each dose and first decrease the dose.  Cut the total daily intake of opioids by one tablet each day Next start to increase the time between doses. The last dose that should be eliminated is the evening dose.      TED hose   Complete by: As directed    Use stockings (TED hose) for 2 weeks on both leg(s).  You may remove them at night for sleeping.        Follow-up Information     Durene Romans, MD. Go on 04/17/2023.   Specialty: Orthopedic Surgery Why: You have a post op appt on Wednesday 04/17/23 at 3:15pm Contact information: [address] [place] [address] [phone]                  Signed: Cassandria Anger 04/11/2023, 9:14 AM

## 2023-04-13 ENCOUNTER — Other Ambulatory Visit: Payer: Self-pay | Admitting: Internal Medicine

## 2023-04-30 ENCOUNTER — Other Ambulatory Visit (HOSPITAL_COMMUNITY): Payer: Self-pay

## 2023-08-02 ENCOUNTER — Other Ambulatory Visit (HOSPITAL_COMMUNITY): Payer: Self-pay | Admitting: Physician Assistant

## 2023-08-02 ENCOUNTER — Other Ambulatory Visit: Payer: Self-pay | Admitting: Internal Medicine

## 2023-08-27 DIAGNOSIS — Z961 Presence of intraocular lens: Secondary | ICD-10-CM | POA: Diagnosis not present

## 2023-08-27 DIAGNOSIS — H25812 Combined forms of age-related cataract, left eye: Secondary | ICD-10-CM | POA: Diagnosis not present

## 2023-08-27 DIAGNOSIS — H2512 Age-related nuclear cataract, left eye: Secondary | ICD-10-CM | POA: Diagnosis not present

## 2023-08-29 DIAGNOSIS — G5603 Carpal tunnel syndrome, bilateral upper limbs: Secondary | ICD-10-CM | POA: Diagnosis not present

## 2023-08-29 DIAGNOSIS — M79642 Pain in left hand: Secondary | ICD-10-CM | POA: Diagnosis not present

## 2023-08-29 DIAGNOSIS — M79641 Pain in right hand: Secondary | ICD-10-CM | POA: Diagnosis not present

## 2023-09-30 ENCOUNTER — Encounter: Payer: Self-pay | Admitting: Neurology

## 2023-09-30 ENCOUNTER — Ambulatory Visit: Admitting: Neurology

## 2023-09-30 ENCOUNTER — Telehealth: Payer: Self-pay | Admitting: Neurology

## 2023-09-30 VITALS — BP 170/99 | HR 76 | Ht 63.0 in | Wt 146.5 lb

## 2023-09-30 DIAGNOSIS — G3184 Mild cognitive impairment, so stated: Secondary | ICD-10-CM | POA: Diagnosis not present

## 2023-09-30 NOTE — Patient Instructions (Signed)
 Will obtain dementia labs including B12, TSH and ATN  Continue your other medications  Continue to follow up with PCP  Return in a year or sooner if worse    There are well-accepted and sensible ways to reduce risk for Alzheimers disease and other degenerative brain disorders .  Exercise Daily Walk A daily 20 minute walk should be part of your routine. Disease related apathy can be a significant roadblock to exercise and the only way to overcome this is to make it a daily routine and perhaps have a reward at the end (something your loved one loves to eat or drink perhaps) or a personal trainer coming to the home can also be very useful. Most importantly, the patient is much more likely to exercise if the caregiver / spouse does it with him/her. In general a structured, repetitive schedule is best.  General Health: Any diseases which effect your body will effect your brain such as a pneumonia, urinary infection, blood clot, heart attack or stroke. Keep contact with your primary care doctor for regular follow ups.  Sleep. A good nights sleep is healthy for the brain. Seven hours is recommended. If you have insomnia or poor sleep habits we can give you some instructions. If you have sleep apnea wear your mask.  Diet: Eating a heart healthy diet is also a good idea; fish and poultry instead of red meat, nuts (mostly non-peanuts), vegetables, fruits, olive oil or canola oil (instead of butter), minimal salt (use other spices to flavor foods), whole grain rice, bread, cereal and pasta and wine in moderation.Research is now showing that the MIND diet, which is a combination of The Mediterranean diet and the DASH diet, is beneficial for cognitive processing and longevity. Information about this diet can be found in The MIND Diet, a book by Annitta Feeling, MS, RDN, and online at [employer]  Finances, Power of [address] and Advance Directives: You should consider putting legal  safeguards in place with regard to financial and medical decision making. While the spouse always has power of attorney for medical and financial issues in the absence of any form, you should consider what you want in case the spouse / caregiver is no longer around or capable of making decisions.

## 2023-09-30 NOTE — Telephone Encounter (Signed)
 Order sent to [facility] to schedule. [phone]

## 2023-09-30 NOTE — Progress Notes (Signed)
 [facility]  PATIENT: Anna Mathews DOB: 10/27/1940  REQUESTING CLINICIAN: Claudene Lacks, MD HISTORY FROM: Patient and daughter  REASON FOR VISIT: Memory loss    HISTORICAL  CHIEF COMPLAINT:  Chief Complaint  Patient presents with   New Patient (Initial Visit)    Pt in room 13. Daughter Anna Mathews in room. Paper referral for Mild cognitive impairment, MOCA 20/30. Short term memory. Pt lives alone in [place]. MOCA:24    HISTORY OF PRESENT ILLNESS:  Discussed the use of AI scribe software for clinical note transcription with the patient, who gave verbal consent to proceed.  Anna Mathews is an 83 year old female with history of atrial fibrillation, hypertension, hyperlipidemia, GERD and anxiety who presents with memory concerns and cognitive changes. She is accompanied by her daughter.   Her daughter is concerned about her memory, but the patient herself is not worried. She has difficulty remembering names, such as her grandson's roommate's name, which she was told multiple times. She requires assistance with paying bills due to confusion, although she has not been late on payments except once. There was an incident where she almost gave money away after a phone call, which was stopped by the bank. Friends have also noticed her forgetfulness, such as not recalling where she ate dinner the previous night. She sometimes repeats stories and has good and bad days regarding her memory.  She lives alone in a condo and recently lost her dog, [name], which was put down about a year ago, although she initially stated it was more recent (a month ago). 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 Her daughter reports that she sometimes does not eat until the afternoon, which is concerning due to her medication needs.  She has had issues with sequencing events, such as losing her phone and not recalling the  sequence of events leading to its loss. She has not had any recent accidents while driving but has not driven on the highway in three years. She experiences dizziness, which she attributes to her medication.  Her sleep pattern is irregular; she sometimes sleeps on the sofa or in a chair and has difficulty falling asleep. She occasionally takes naps during the day. She has not had a sleep study and has no history of sleep apnea, seizures, stroke, traumatic brain injury, or depression. She is on Buspar  for anxiety related to her mosquito concerns.  She has a history of high blood pressure but has not taken medication for it recently as it was previously discontinued. She has a blood pressure cuff at home but does not regularly check her blood pressure. There is no known family history of dementia.  She underwent hip surgery in March 2025 after initially thinking she had pulled a muscle. She reports feeling fine post-surgery but still experiences some discomfort. She tries to walk daily for exercise.    TBI:   No past history of TBI Stroke:   no past history of stroke Seizures:   no past history of seizures Sleep:   no history of sleep apnea.   Mood: anxiety and is on medication  Family history of Dementia: Denies  Functional status: independent in all ADLs and IADLs Patient lives alone. Cooking: no issues Cleaning: no issues  Shopping: no issues Bathing: no issues Toileting: no issues  Driving: no issues  Bills: Daughter took over  Medications: no issues  Ever left the stove  on by accident?: Denies Forget how to use items around the house?: denies Getting lost going to familiar places?: denies Forgetting loved ones names?: yes Word finding difficulty? Denies  Sleep: not good    OTHER MEDICAL CONDITIONS: Hypertension, Hyperlipidemia, GERD, Atrial fibrillation    REVIEW OF SYSTEMS: Full 14 system review of systems performed and negative with exception of: As noted in the HPI    ALLERGIES: Allergies  Allergen Reactions   Atorvastatin Other (See Comments)    messed with her liver   Penicillins Other (See Comments)    Itching in the eyes   Ok to take    Amlodipine  Itching    Purpura rash   Diltiazem  Itching and Rash    burning     HOME MEDICATIONS: Outpatient Medications Prior to Visit  Medication Sig Dispense Refill   acetaminophen  (TYLENOL ) 500 MG tablet Take 500 mg by mouth every 6 (six) hours as needed for headache or moderate pain.     bisoprolol  (ZEBETA ) 5 MG tablet TAKE 1 TABLET (5 MG TOTAL) BY MOUTH DAILY. 30 tablet 0   busPIRone  (BUSPAR ) 15 MG tablet Take 7.5 mg by mouth 2 (two) times daily.     Calcium  Citrate (CITRACAL PO) Take 2 tablets by mouth 2 (two) times daily.     diltiazem  (CARDIZEM ) 30 MG tablet Take 30 mg by mouth every 6 (six) hours as needed (AFIB).     dronedarone  (MULTAQ ) 400 MG tablet Take 400 mg by mouth 2 (two) times daily.     ELIQUIS  5 MG TABS tablet TAKE 1 TABLET BY MOUTH TWICE A DAY 60 tablet 6   OVER THE COUNTER MEDICATION Take 1 tablet by mouth 2 (two) times daily. Total Beets     Polyethyl Glycol-Propyl Glycol (LUBRICATING EYE DROPS OP) Place 1 drop into both eyes daily as needed (dry eyes).     polyethylene glycol powder (GLYCOLAX /MIRALAX ) 17 GM/SCOOP powder Dissolve 17 g in liquid and take by mouth 2 (two) times daily. 238 g 0   rosuvastatin  (CRESTOR ) 5 MG tablet Take 5 mg by mouth daily.     No facility-administered medications prior to visit.    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   Hyperlipidemia    Hypertension    off of blood pressure meds x 2 months- taken off by DR Margarete on [address]   Osteopenia    Postmenopausal hormone replacement therapy 1989 - 07/2000    PAST  SURGICAL HISTORY: Past Surgical History:  Procedure Laterality Date   BREAST DUCTAL SYSTEM EXCISION Left 06/2002   duct ectasia with fibrosis - atypical lobular hyperplasia with micro calcifications - tamoxifen X 28 months then change to Evista  10/06   BREAST LUMPECTOMY WITH RADIOACTIVE SEED LOCALIZATION Right 01/27/2020   Procedure: RIGHT BREAST LUMPECTOMY WITH RADIOACTIVE SEED LOCALIZATION;  Surgeon: Vernetta Berg, MD;  Location[facility];  Service: General;  Laterality: Right;   BREAST SURGERY Left 4/99   breast biopsy - fibrosis   BRONCHIAL BIOPSY  10/11/2021   Procedure: BRONCHIAL BIOPSIES;  Surgeon: Kara Dorn NOVAK, MD;  Location: [facility] ENDOSCOPY;  Service: Pulmonary;;   BRONCHIAL WASHINGS  10/11/2021   Procedure: BRONCHIAL WASHINGS;  Surgeon: Kara Dorn NOVAK, MD;  Location: [facility] ENDOSCOPY;  Service: Pulmonary;;  CARDIOVERSION N/A 07/16/2017   Procedure: CARDIOVERSION;  Surgeon: Jeffrie Oneil BROCKS, MD;  Location: [facility] ENDOSCOPY;  Service: Cardiovascular;  Laterality: N/A;   CARDIOVERSION N/A 10/20/2018   Procedure: CARDIOVERSION;  Surgeon: Lonni Slain, MD;  Location: [facility] ENDOSCOPY;  Service: Cardiovascular;  Laterality: N/A;   CARDIOVERSION N/A 10/22/2022   Procedure: CARDIOVERSION;  Surgeon: Delford Maude BROCKS, MD;  Location: [facility] INVASIVE CV LAB;  Service: Cardiovascular;  Laterality: N/A;   CATARACT EXTRACTION W/ INTRAOCULAR LENS IMPLANT Right    CESAREAN SECTION     X 2   HYSTEROSCOPY  4/95   w/D&C   TEE WITHOUT CARDIOVERSION N/A 10/20/2018   Procedure: TRANSESOPHAGEAL ECHOCARDIOGRAM (TEE);  Surgeon: Lonni Slain, MD;  Location: [facility] ENDOSCOPY;  Service: Cardiovascular;  Laterality: N/A;   TOTAL HIP ARTHROPLASTY Right 04/02/2023   Procedure: ARTHROPLASTY, HIP, TOTAL, ANTERIOR APPROACH;  Surgeon: Ernie Cough, MD;  Location: [facility] ORS;  Service: Orthopedics;  Laterality: Right;   VIDEO BRONCHOSCOPY N/A 10/11/2021   Procedure: VIDEO BRONCHOSCOPY WITH FLUORO;  Surgeon: Kara Dorn NOVAK, MD;  Location: [facility] ENDOSCOPY;  Service: Pulmonary;  Laterality: N/A;    FAMILY HISTORY: Family History  Problem Relation Age of Onset   Hypertension Mother    Heart disease Father    COPD Father    Diabetes Maternal Grandfather    Diabetes Son    Rheum arthritis Brother    Kidney cancer Brother    Breast cancer Maternal Grandmother    Diabetes Maternal Aunt    Diabetes Maternal Aunt     SOCIAL HISTORY: Social History   Socioeconomic History   Marital status: Widowed    Spouse name: Not on file   Number of children: 2   Years of education: Not on file   Highest education level: Not on file  Occupational History   Not on file  Tobacco Use   Smoking status: Former    Current packs/day: 0.00    Average packs/day: 0.3 packs/day for 12.0 years (3.0 ttl pk-yrs)    Types: Cigarettes    Start date: 01/16/1957    Quit date: 01/23/1968    Years since quitting: 55.7   Smokeless tobacco: Never   Tobacco comments:    Former smoker 08/04/21  Vaping Use   Vaping status: Never Used  Substance and Sexual Activity   Alcohol use: Not Currently    Comment: rare wine or liquor   Drug use: No   Sexual activity: Not Currently    Partners: Male    Birth control/protection: Post-menopausal  Other Topics Concern   Not on file  Social History Narrative   Not on file   Social Drivers of Health   Financial Resource Strain: Low Risk  (03/24/2022)   Received from [facility]   Overall Financial Resource Strain (CARDIA)    Difficulty of Paying Living Expenses: Not hard at all  Food Insecurity: No Food Insecurity (04/02/2023)   Hunger Vital Sign    Worried About Running Out of Food in the Last Year: Never true    Ran Out of Food in the Last Year: Never true  Transportation Needs: No Transportation Needs (04/02/2023)   PRAPARE - Administrator, Civil Service (Medical): No    Lack of Transportation (Non-Medical): No  Physical Activity: Not on file   Stress: Not on file  Social Connections: Moderately Integrated (04/02/2023)   Social Connection and Isolation Panel    Frequency of Communication with Friends and Family: More than three times a week  Frequency of Social Gatherings with Friends and Family: More than three times a week    Attends Religious Services: More than 4 times per year    Active Member of Clubs or Organizations: Yes    Attends Banker Meetings: More than 4 times per year    Marital Status: Widowed  Intimate Partner Violence: Not At Risk (04/02/2023)   Humiliation, Afraid, Rape, and Kick questionnaire    Fear of Current or Ex-Partner: No    Emotionally Abused: No    Physically Abused: No    Sexually Abused: No    PHYSICAL EXAM  GENERAL EXAM/CONSTITUTIONAL: Vitals:  Vitals:   09/30/23 1035 09/30/23 1054  BP: (!) 178/114 (!) 170/99  Pulse: 76   Weight: 146 lb 8 oz (66.5 kg)   Height: 5' 3 (1.6 m)    Body mass index is 25.95 kg/m. Wt Readings from Last 3 Encounters:  09/30/23 146 lb 8 oz (66.5 kg)  04/02/23 141 lb (64 kg)  03/20/23 141 lb (64 kg)   Patient is in no distress; well developed, nourished and groomed; neck is supple  MUSCULOSKELETAL: Gait, strength, tone, movements noted in Neurologic exam below  NEUROLOGIC: MENTAL STATUS:      No data to display            09/30/2023   10:40 AM  Montreal Cognitive Assessment   Visuospatial/ Executive (0/5) 5  Naming (0/3) 3  Attention: Read list of digits (0/2) 2  Attention: Read list of letters (0/1) 1  Attention: Serial 7 subtraction starting at 100 (0/3) 3  Language: Repeat phrase (0/2) 2  Language : Fluency (0/1) 1  Abstraction (0/2) 2  Delayed Recall (0/5) 0  Orientation (0/6) 5  Total 24    awake, alert, oriented to person, place and time Difficulty with recent memory normal attention and concentration language fluent, comprehension intact, naming intact fund of knowledge appropriate  CRANIAL NERVE:  2nd, 3rd,  4th, 6th- visual fields full to confrontation, extraocular muscles intact, no nystagmus 5th - facial sensation symmetric 7th - facial strength symmetric 8th - hearing intact 9th - palate elevates symmetrically, uvula midline 11th - shoulder shrug symmetric 12th - tongue protrusion midline  MOTOR:  normal bulk and tone, full strength in the BUE, BLE  SENSORY:  normal and symmetric to light touch  COORDINATION:  finger-nose-finger, fine finger movements normal  GAIT/STATION:  normal   DIAGNOSTIC DATA (LABS, IMAGING, TESTING) - I reviewed patient records, labs, notes, testing and imaging myself where available.  Lab Results  Component Value Date   WBC 7.9 04/03/2023   HGB 11.1 (L) 04/03/2023   HCT 36.1 04/03/2023   MCV 96.3 04/03/2023   PLT 163 04/03/2023      Component Value Date/Time   NA 139 04/03/2023 0354   NA 141 03/23/2021 1509   K 4.4 04/03/2023 0354   CL 109 04/03/2023 0354   CO2 23 04/03/2023 0354   GLUCOSE 133 (H) 04/03/2023 0354   BUN 35 (H) 04/03/2023 0354   BUN 22 03/23/2021 1509   CREATININE 0.91 04/03/2023 0354   CREATININE 1.12 (H) 12/23/2019 1411   CALCIUM  8.4 (L) 04/03/2023 0354   PROT 6.4 12/07/2021 1357   PROT 6.6 12/24/2016 0900   ALBUMIN 4.0 12/07/2021 1357   ALBUMIN 4.4 12/24/2016 0900   AST 13 12/07/2021 1357   AST 20 12/23/2019 1411   ALT 19 12/07/2021 1357   ALT 15 12/23/2019 1411   ALKPHOS 48 12/07/2021 1357   BILITOT  0.5 12/07/2021 1357   BILITOT 0.5 12/23/2019 1411   GFRNONAA >60 04/03/2023 0354   GFRNONAA 50 (L) 12/23/2019 1411   GFRAA 66 10/14/2018 1434   No results found for: CHOL, HDL, LDLCALC, LDLDIRECT, TRIG, CHOLHDL No results found for: YHAJ8R No results found for: VITAMINB12 Lab Results  Component Value Date   TSH 3.71 11/03/2020    ASSESSMENT AND PLAN  83 y.o. year old female with history of hypertension, hyperlipidemia, atrial fibrillation, GERD, anxiety who is presenting with memory loss  described as forgetful.  Patient feels like her memory is fine, even better than her peers.  Daughter reports some fixation about bugs or mosquitoes, forgetfulness, repetitiveness.  She lives independently alone with a MoCA of 24.  Mild cognitive impairment Mild cognitive impairment characterized by forgetfulness and difficulty with names and recent events. No evidence of dementia as activities of daily living are intact. Memory tests show deficits not severe enough to indicate dementia. Potential progression to dementia discussed, with consideration of Alzheimer's disease as a potential underlying cause. 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 Anxiety may be exacerbated by concerns about memory and health issues.    1. Mild cognitive impairment      Patient Instructions  Will obtain dementia labs including B12, TSH and ATN  Continue your other medications  Continue to follow up with PCP  Return in a year or sooner if worse    There are well-accepted and sensible ways to reduce risk for Alzheimers disease and other degenerative brain disorders .  Exercise Daily Walk A daily 20 minute walk should be part of your routine. Disease related apathy can be a significant roadblock to exercise and the only way to overcome this is to make it a daily routine and perhaps have a reward at the end (something your loved one loves to eat or drink perhaps) or a personal trainer coming to the home can also be very useful. Most importantly, the patient is much more likely to exercise if the caregiver / spouse does it with him/her. In general a structured, repetitive  schedule is best.  General Health: Any diseases which effect your body will effect your brain such as a pneumonia, urinary infection, blood clot, heart attack or stroke. Keep contact with your primary care doctor for regular follow ups.  Sleep. A good nights sleep is healthy for the brain. Seven hours is recommended. If you have insomnia or poor sleep habits we can give you some instructions. If you have sleep apnea wear your mask.  Diet: Eating a heart healthy diet is also a good idea; fish and poultry instead of red meat, nuts (mostly non-peanuts), vegetables, fruits, olive oil or canola oil (instead of butter), minimal salt (use other spices to flavor foods), whole grain rice, bread, cereal and pasta and wine in moderation.Research is now showing that the MIND diet, which is a combination of The Mediterranean diet and the DASH diet, is beneficial for cognitive processing and longevity. Information about this diet can be found in The MIND Diet, a book by Annitta Feeling, MS, RDN, and online at [employer].es  Finances, Power of [address] and Advance Directives: You should consider putting legal safeguards in place  with regard to financial and medical decision making. While the spouse always has power of attorney for medical and financial issues in the absence of any form, you should consider what you want in case the spouse / caregiver is no longer around or capable of making decisions.   Orders Placed This Encounter  Procedures   CT HEAD WO CONTRAST ( )   TSH   Vitamin B12   ATN PROFILE    No orders of the defined types were placed in this encounter.   Return in about 1 year (around 09/29/2024).  I have spent a total of 65 minutes dedicated to this patient today, preparing to see patient, performing a medically appropriate examination and evaluation, ordering tests and/or medications and procedures, and counseling and educating the patient/family/caregiver;  independently interpreting result and communicating results to the family/patient/caregiver; and documenting clinical information in the electronic medical record.   Pastor Falling, MD 09/30/2023, 3:18 PM  [facility] [address] [place], [place] [address] [phone]

## 2023-10-01 ENCOUNTER — Encounter: Payer: Self-pay | Admitting: Neurology

## 2023-10-03 ENCOUNTER — Ambulatory Visit: Payer: Self-pay | Admitting: Neurology

## 2023-10-03 LAB — ATN PROFILE
A -- Beta-amyloid 42/40 Ratio: 0.104 (ref >0.102)
Beta-amyloid 40: 232.27 pg/mL
Beta-amyloid 42: 24.22 pg/mL
N -- NfL, Plasma: 4.23 pg/mL (ref 0.00–9.13)
T -- p-tau181: 1.89 pg/mL — AB (ref 0.00–0.97)

## 2023-10-03 LAB — VITAMIN B12: Vitamin B-12: 849 pg/mL (ref 232–1245)

## 2023-10-03 LAB — TSH: TSH: 4.12 u[IU]/mL (ref 0.450–4.500)

## 2023-10-04 ENCOUNTER — Emergency Department (HOSPITAL_BASED_OUTPATIENT_CLINIC_OR_DEPARTMENT_OTHER)

## 2023-10-04 ENCOUNTER — Other Ambulatory Visit: Payer: Self-pay

## 2023-10-04 ENCOUNTER — Emergency Department (HOSPITAL_BASED_OUTPATIENT_CLINIC_OR_DEPARTMENT_OTHER)
Admission: EM | Admit: 2023-10-04 | Discharge: 2023-10-04 | Disposition: A | Discharge status: Home / Self Care | Attending: Emergency Medicine | Admitting: Emergency Medicine

## 2023-10-04 ENCOUNTER — Encounter (HOSPITAL_BASED_OUTPATIENT_CLINIC_OR_DEPARTMENT_OTHER): Payer: Self-pay

## 2023-10-04 DIAGNOSIS — Z7901 Long term (current) use of anticoagulants: Secondary | ICD-10-CM | POA: Diagnosis not present

## 2023-10-04 DIAGNOSIS — Y9389 Activity, other specified: Secondary | ICD-10-CM | POA: Diagnosis not present

## 2023-10-04 DIAGNOSIS — W01198A Fall on same level from slipping, tripping and stumbling with subsequent striking against other object, initial encounter: Secondary | ICD-10-CM | POA: Insufficient documentation

## 2023-10-04 DIAGNOSIS — Y92007 Garden or yard of unspecified non-institutional (private) residence as the place of occurrence of the external cause: Secondary | ICD-10-CM | POA: Insufficient documentation

## 2023-10-04 DIAGNOSIS — S0990XA Unspecified injury of head, initial encounter: Secondary | ICD-10-CM | POA: Insufficient documentation

## 2023-10-04 NOTE — ED Provider Notes (Signed)
 [place] EMERGENCY DEPARTMENT AT [facility] Provider Note   CSN: [id number] Arrival date & time: 10/04/23  1851     Patient presents with: Anna Mathews is a 83 y.o. female.  {Add pertinent medical, surgical, social history, OB history to HPI:7970} 83 year old female on Eliquis  who presents emergency department for fall.  Patient was outside doing some yard work when she fell and hit the right side of her head.  Did not lose consciousness.  Has had some mild throbbing in that area.  Mild headache but no vomiting.  No neck pain.  No other injuries as a result of the fall.       Prior to Admission medications   Medication Sig Start Date End Date Taking? Authorizing Provider  acetaminophen  (TYLENOL ) 500 MG tablet Take 500 mg by mouth every 6 (six) hours as needed for headache or moderate pain.    [provider]  bisoprolol  (ZEBETA ) 5 MG tablet TAKE 1 TABLET (5 MG TOTAL) BY MOUTH DAILY. 08/02/23   Fenton, Clint R, PA  busPIRone  (BUSPAR ) 15 MG tablet Take 7.5 mg by mouth 2 (two) times daily. 09/16/21   [provider]  Calcium  Citrate (CITRACAL PO) Take 2 tablets by mouth 2 (two) times daily.    [provider]  diltiazem  (CARDIZEM ) 30 MG tablet Take 30 mg by mouth every 6 (six) hours as needed (AFIB). 02/04/23   [provider]  dronedarone  (MULTAQ ) 400 MG tablet Take 400 mg by mouth 2 (two) times daily.    [provider]  ELIQUIS  5 MG TABS tablet TAKE 1 TABLET BY MOUTH TWICE A DAY 02/08/23   Fenton, Clint R, PA  OVER THE COUNTER MEDICATION Take 1 tablet by mouth 2 (two) times daily. Total Beets    [provider]  Polyethyl Glycol-Propyl Glycol (LUBRICATING EYE DROPS OP) Place 1 drop into both eyes daily as needed (dry eyes).    [provider]  polyethylene glycol powder (GLYCOLAX /MIRALAX ) 17 GM/SCOOP powder Dissolve 17 g in liquid and take by mouth 2 (two) times daily. 04/03/23   Anna Rosina SAUNDERS, PA-C   rosuvastatin  (CRESTOR ) 5 MG tablet Take 5 mg by mouth daily. 01/05/22   [provider]    Allergies: Atorvastatin, Penicillins, Amlodipine , and Diltiazem     Review of Systems  Updated Vital Signs BP (!) 143/108 (BP Location: Right Arm)   Pulse 95   Temp 98.6 F (37 C) (Oral)   Resp 18   Ht 5' 3 (1.6 m)   Wt 63.5 kg   LMP 01/23/1988 (Approximate)   SpO2 98%   BMI 24.80 kg/m   Physical Exam Constitutional:      General: She is not in acute distress.    Appearance: Normal appearance. She is not ill-appearing.  HENT:     Head: Normocephalic and atraumatic.     Comments: No significant bruising to the head where impact occurred    Right Ear: External ear normal.     Left Ear: External ear normal.     Mouth/Throat:     Mouth: Mucous membranes are moist.     Pharynx: Oropharynx is clear.  Eyes:     Extraocular Movements: Extraocular movements intact.     Conjunctiva/sclera: Conjunctivae normal.     Pupils: Pupils are equal, round, and reactive to light.  Neck:     Comments: No C-spine midline tenderness to palpation Musculoskeletal:        General: No deformity. Normal range of  motion.     Cervical back: No rigidity or tenderness.     Comments: No tenderness to palpation of chest wall.  No bruising noted.  No tenderness to palpation of bilateral clavicles.  No tenderness to palpation, bruising, or deformities noted of bilateral shoulders, elbows, wrists, hips, knees, or ankles.  Neurological:     General: No focal deficit present.     Mental Status: She is alert and oriented to person, place, and time. Mental status is at baseline.     Motor: No weakness.     (all labs ordered are listed, but only abnormal results are displayed) Labs Reviewed - No data to display  EKG: None  Radiology: No results found.  {Document cardiac monitor, telemetry assessment procedure when appropriate:32947} Procedures   Medications Ordered in the ED - No data to  display    {Click here for ABCD2, HEART and other calculators REFRESH Note before signing:1}                              Medical Decision Making Amount and/or Complexity of Data Reviewed Radiology: ordered.   ***  {Document critical care time when appropriate  Document review of labs and clinical decision tools ie CHADS2VASC2, etc  Document your independent review of radiology images and any outside records  Document your discussion with family members, caretakers and with consultants  Document social determinants of health affecting pt's care  Document your decision making why or why not admission, treatments were needed:32947:::1}   Final diagnoses:  None    ED Discharge Orders     None

## 2023-10-04 NOTE — ED Triage Notes (Signed)
 Pt reports falling backwards landing on R side. Pt denies any LOC. Pt reports eliquis . Pt reports falling from tripping. Pt reports landing on concrete. Pt endorses R side face pain.

## 2023-10-04 NOTE — Discharge Instructions (Signed)
 You were seen after head injury.  Your CT scan did not show any traumatic injuries.  Take Tylenol  as needed for your pain and ice your forehead

## 2023-10-04 NOTE — ED Notes (Signed)
 Pt given discharge instructions. Opportunities given for questions. Pt stable at time of discharge.

## 2023-10-07 ENCOUNTER — Encounter: Payer: Self-pay | Admitting: Neurology

## 2023-10-08 ENCOUNTER — Other Ambulatory Visit

## 2023-10-15 DIAGNOSIS — Z7901 Long term (current) use of anticoagulants: Secondary | ICD-10-CM | POA: Diagnosis not present

## 2023-10-15 DIAGNOSIS — E119 Type 2 diabetes mellitus without complications: Secondary | ICD-10-CM | POA: Diagnosis not present

## 2023-10-15 DIAGNOSIS — F411 Generalized anxiety disorder: Secondary | ICD-10-CM | POA: Diagnosis not present

## 2023-10-15 DIAGNOSIS — E782 Mixed hyperlipidemia: Secondary | ICD-10-CM | POA: Diagnosis not present

## 2023-10-15 DIAGNOSIS — I4819 Other persistent atrial fibrillation: Secondary | ICD-10-CM | POA: Diagnosis not present

## 2023-10-15 DIAGNOSIS — D6869 Other thrombophilia: Secondary | ICD-10-CM | POA: Diagnosis not present

## 2023-10-15 DIAGNOSIS — Z0000 Encounter for general adult medical examination without abnormal findings: Secondary | ICD-10-CM | POA: Diagnosis not present

## 2023-10-15 DIAGNOSIS — I10 Essential (primary) hypertension: Secondary | ICD-10-CM | POA: Diagnosis not present

## 2023-10-28 ENCOUNTER — Encounter: Payer: Self-pay | Admitting: Interventional Cardiology

## 2023-10-28 ENCOUNTER — Encounter (INDEPENDENT_AMBULATORY_CARE_PROVIDER_SITE_OTHER): Payer: Self-pay | Admitting: Interventional Cardiology

## 2023-11-03 ENCOUNTER — Other Ambulatory Visit: Payer: Self-pay | Admitting: Physician Assistant

## 2023-11-08 ENCOUNTER — Encounter: Payer: Self-pay | Admitting: Cardiology

## 2023-11-08 ENCOUNTER — Ambulatory Visit: Attending: Cardiology | Admitting: Cardiology

## 2023-11-08 VITALS — BP 137/80 | HR 78 | Ht 63.5 in | Wt 149.0 lb

## 2023-11-08 DIAGNOSIS — I10 Essential (primary) hypertension: Secondary | ICD-10-CM | POA: Diagnosis not present

## 2023-11-08 DIAGNOSIS — I4819 Other persistent atrial fibrillation: Secondary | ICD-10-CM

## 2023-11-08 DIAGNOSIS — Z79899 Other long term (current) drug therapy: Secondary | ICD-10-CM

## 2023-11-08 NOTE — Patient Instructions (Signed)
 Medication Instructions:  Your physician has recommended you make the following change in your medication:  1) STOP taking Multaq    *If you need a refill on your cardiac medications before your next appointment, please call your pharmacy*   Follow-Up: At [facility], you and your health needs are our priority.  As part of our continuing mission to provide you with exceptional heart care, our providers are all part of one team.  This team includes your primary Cardiologist (physician) and Advanced Practice Providers or APPs (Physician Assistants and Nurse Practitioners) who all work together to provide you with the care you need, when you need it.  Your next appointment:   6 months  Provider:   You will follow up in the Atrial Fibrillation Clinic located at [facility]. Your provider will be: Clint R. Fenton, PA-C or Fairy Heinrich, PA-C

## 2023-11-08 NOTE — Progress Notes (Signed)
  Electrophysiology Office Note:   Date:  11/08/2023  ID:  Anna Mathews, DOB Nov 28, 1940, MRN [id number]  Primary Cardiologist: Candyce Reek, MD Primary Heart Failure: None Electrophysiologist: Georg Ang Gladis Norton, MD      History of Present Illness:   Anna Mathews is a 83 y.o. female with h/o atrial fibrillation, hypertension, hyperlipidemia seen today for routine electrophysiology followup.   Since last being seen in our clinic the patient reports doing well.  She does remain in atrial fibrillation.  She has no chest pain and only mild shortness of breath.  She continues to do her daily activities.  At this point, she is not interested in rhythm control.  She does not want another ablation..  she denies chest pain, palpitations, PND, orthopnea, nausea, vomiting, dizziness, syncope, edema, weight gain, or early satiety.   Review of systems complete and found to be negative unless listed in HPI.   EP Information / Studies Reviewed:    EKG is ordered today. Personal review as below.  EKG Interpretation Date/Time:  Friday November 08 2023 15:06:01 EDT Ventricular Rate:  73 PR Interval:    QRS Duration:  88 QT Interval:  400 QTC Calculation: 440 R Axis:   43  Text Interpretation: Atrial fibrillation with a competing junctional pacemaker When compared with ECG of 09-Jan-2023 10:30, No significant change since last tracing Confirmed by Steadman Prosperi ([id number]) on 11/08/2023 3:13:04 PM    Risk Assessment/Calculations:    CHA2DS2-VASc Score = 4   This indicates a 4.8% annual risk of stroke. [name] score is based upon: CHF History: 0 HTN History: 1 Diabetes History: 0 Stroke History: 0 Vascular Disease History: 0 Age Score: 2 Gender Score: 1            Physical Exam:   VS:  BP 137/80 (BP Location: Left Arm, Patient Position: Sitting, Cuff Size: Normal)   Pulse 78   Ht 5' 3.5 (1.613 m)   Wt 149 lb (67.6 kg)   LMP 01/23/1988 (Approximate)   SpO2 97%   BMI 25.98  kg/m    Wt Readings from Last 3 Encounters:  11/08/23 149 lb (67.6 kg)  10/04/23 140 lb (63.5 kg)  09/30/23 146 lb 8 oz (66.5 kg)     GEN: Well nourished, well developed in no acute distress NECK: No JVD; No carotid bruits CARDIAC: Irregularly irregular rate and rhythm, no murmurs, rubs, gallops RESPIRATORY:  Clear to auscultation without rales, wheezing or rhonchi  ABDOMEN: Soft, non-tender, non-distended EXTREMITIES:  No edema; No deformity   ASSESSMENT AND PLAN:    1.  Persistent atrial fibrillation/flutter: On Multaq .  She is in atrial fibrillation today.  She has some shortness of breath, but otherwise feels well.  She is not interested in ablation or alternative antiarrhythmics at this time as she is feeling well.  [place] Dani stop her Multaq .  If she is doing well at follow-up, she can potentially continue to follow with her primary physician who can prescribe her Eliquis .  2.  Secondary hypercoagulable date: On Eliquis   3.  Hyperlipidemia: On Zocor per primary care  4.  Hypertension: Well-controlled   Follow up with [facility] Clinic in 6 months  Signed, Naresh Althaus Gladis Norton, MD

## 2023-12-03 DIAGNOSIS — Z1231 Encounter for screening mammogram for malignant neoplasm of breast: Secondary | ICD-10-CM | POA: Diagnosis not present

## 2023-12-07 ENCOUNTER — Other Ambulatory Visit: Payer: Self-pay | Admitting: Physician Assistant

## 2024-01-28 ENCOUNTER — Ambulatory Visit: Admitting: Cardiovascular Disease

## 2024-01-28 ENCOUNTER — Encounter: Payer: Self-pay | Admitting: Cardiovascular Disease

## 2024-01-28 DIAGNOSIS — I4819 Other persistent atrial fibrillation: Secondary | ICD-10-CM

## 2024-01-28 DIAGNOSIS — E7800 Pure hypercholesterolemia, unspecified: Secondary | ICD-10-CM | POA: Diagnosis not present

## 2024-01-28 DIAGNOSIS — R7303 Prediabetes: Secondary | ICD-10-CM

## 2024-01-28 DIAGNOSIS — I10 Essential (primary) hypertension: Secondary | ICD-10-CM | POA: Diagnosis not present

## 2024-01-28 DIAGNOSIS — R4189 Other symptoms and signs involving cognitive functions and awareness: Secondary | ICD-10-CM

## 2024-01-28 NOTE — Progress Notes (Signed)
 " Cardiology Office Note:    Date:  02/02/2024 she had another cardioversion 11/12/2022 at [facility] with Dr. Donnel  ID:  Anna Mathews, DOB Nov 30, 1940, MRN [id number]  PCP:  Delayne Artist PARAS, MD   [facility] Providers Cardiologist:  Jerel Balding, MD Electrophysiologist:  Will Gladis Norton, MD     Referring MD: Claudene Lacks, MD   Chief Complaint  Patient presents with   Atrial Fibrillation    History of Present Illness:    Anna Mathews is a 84 y.o. female with a hx of paroxysmal and persistent atrial flutter and atrial fibrillation, here to establish follow-up after Dr. Johnice departure.  Additional medical problems include hypertension, hypercholesterolemia, prediabetes ,  mild obstructive sleep apnea, aortic atherosclerosis.  Her daughter accompanies her and fills in some of the gaps in her history since she is having some memory difficulties.  She is generally doing well.  She denies palpitations, dizziness or syncope.  Has not had lower extremity edema, orthopnea, PND and chest pain has never been a complaint.  She saw Dr. Norton in October and a decision was made to stop antiarrhythmic medications and manage this as permanent A-fib.  She has not had any symptoms such as stroke or TIA and has not had any falls, serious injuries or bleeding problems.  She does not have much in the way of structural heart problems.  Most recent echo showed normal LVEF and no evidence of significant LVH.  The left atrium is moderately dilated (end-systolic diameter 4.5 cm, end-systolic volume index 38.8 mL/m).  There are no significant valvular abnormalities other than mitral annulus calcification aortic valve sclerosis.  CHA2DS2-VASc score is 72 (age, HTN, gender).  BP was slightly elevated when she first checked in but when rechecked was 134/69.  She has good metabolic control with an LDL of 74 and hemoglobin A1c of 5.8%, minimally elevated triglycerides at 181.    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   Hyperlipidemia    Hypertension    off of blood pressure meds x 2 months- taken off by DR Margarete on [address]   Osteopenia    Postmenopausal hormone replacement therapy 1989 - 07/2000    Past Surgical History:  Procedure Laterality Date   BREAST DUCTAL SYSTEM EXCISION Left 06/2002   duct ectasia with fibrosis - atypical lobular hyperplasia with micro calcifications - tamoxifen X 28 months then change to Evista  10/06   BREAST LUMPECTOMY WITH RADIOACTIVE SEED LOCALIZATION Right 01/27/2020   Procedure: RIGHT BREAST LUMPECTOMY WITH RADIOACTIVE SEED LOCALIZATION;  Surgeon: Vernetta Berg, MD;  Location: [facility] OR;  Service: General;  Laterality: Right;   BREAST SURGERY Left 4/99   breast biopsy - fibrosis   BRONCHIAL BIOPSY  10/11/2021   Procedure: BRONCHIAL BIOPSIES;  Surgeon: Kara Dorn NOVAK, MD;  Location: [facility] ENDOSCOPY;  Service: Pulmonary;;   BRONCHIAL WASHINGS  10/11/2021   Procedure: BRONCHIAL WASHINGS;  Surgeon: Kara Dorn NOVAK, MD;  Location: [facility] ENDOSCOPY;  Service: Pulmonary;;   CARDIOVERSION N/A 07/16/2017   Procedure: CARDIOVERSION;  Surgeon: Jeffrie Oneil BROCKS, MD;  Location: [facility] ENDOSCOPY;  Service: Cardiovascular;  Laterality: N/A;   CARDIOVERSION N/A 10/20/2018   Procedure: CARDIOVERSION;  Surgeon: Lonni Slain, MD;  Location: [facility] ENDOSCOPY;  Service: Cardiovascular;  Laterality:  N/A;   CARDIOVERSION N/A 10/22/2022   Procedure: CARDIOVERSION;  Surgeon: Delford Maude BROCKS, MD;  Location: [facility] INVASIVE CV LAB;  Service: Cardiovascular;  Laterality: N/A;   CATARACT EXTRACTION W/ INTRAOCULAR LENS IMPLANT Right    CESAREAN SECTION     X 2   HYSTEROSCOPY  4/95   w/D&C   TEE WITHOUT CARDIOVERSION N/A 10/20/2018   Procedure: TRANSESOPHAGEAL  ECHOCARDIOGRAM (TEE);  Surgeon: Lonni Slain, MD;  Location: [facility] ENDOSCOPY;  Service: Cardiovascular;  Laterality: N/A;   TOTAL HIP ARTHROPLASTY Right 04/02/2023   Procedure: ARTHROPLASTY, HIP, TOTAL, ANTERIOR APPROACH;  Surgeon: Ernie Cough, MD;  Location: [facility] ORS;  Service: Orthopedics;  Laterality: Right;   VIDEO BRONCHOSCOPY N/A 10/11/2021   Procedure: VIDEO BRONCHOSCOPY WITH FLUORO;  Surgeon: Kara Dorn NOVAK, MD;  Location: [facility] ENDOSCOPY;  Service: Pulmonary;  Laterality: N/A;    Current Medications: Current Meds  Medication Sig   acetaminophen  (TYLENOL ) 500 MG tablet Take 500 mg by mouth every 6 (six) hours as needed for headache or moderate pain.   apixaban  (ELIQUIS ) 5 MG TABS tablet TAKE 1 TABLET BY MOUTH TWICE A DAY   aspirin  81 MG chewable tablet Chew 81 mg by mouth as needed.   bisoprolol  (ZEBETA ) 5 MG tablet TAKE 1 TABLET (5 MG TOTAL) BY MOUTH DAILY. (Patient taking differently: Take 5 mg by mouth as needed.)   busPIRone  (BUSPAR ) 15 MG tablet Take 7.5 mg by mouth 2 (two) times daily.   Calcium  Citrate (CITRACAL PO) Take 2 tablets by mouth 2 (two) times daily. (Patient taking differently: Take 2 tablets by mouth daily at 6 (six) AM.)   citalopram (CELEXA) 10 MG tablet Take 10 mg by mouth daily.   OVER THE COUNTER MEDICATION Take 1 tablet by mouth 2 (two) times daily. Total Beets   rosuvastatin  (CRESTOR ) 5 MG tablet Take 5 mg by mouth daily.     Allergies:   Atorvastatin, Penicillins, Amlodipine , Diltiazem , and Sulfa  antibiotics       Family History: [name] family history includes Breast cancer in her maternal grandmother; COPD in her father; Diabetes in her maternal aunt, maternal aunt, maternal grandfather, and son; Heart disease in her father; Hypertension in her mother; Kidney cancer in her brother; Rheum arthritis in her brother.  ROS:   Please see the history of present illness.     All other systems reviewed and are negative.  EKGs/Labs/Other Studies  Reviewed:    The following studies were reviewed today:     Personally reviewed the most recent ECG tracing from 11/08/2023 which shows atrial fibrillation, rate controlled.  Recent Labs: 04/03/2023: BUN 35; Creatinine, Ser 0.91; Hemoglobin 11.1; Platelets 163; Potassium 4.4; Sodium 139 09/30/2023: TSH 4.120  Recent Lipid Panel No results found for: CHOL, TRIG, HDL, CHOLHDL, VLDL, LDLCALC, LDLDIRECT  05/25/2022 cholesterol 153, HDL 60, LDL 80, triglycerides 64, hemoglobin A1c 6.3%   01/01/2024 Cholesterol 156, HDL 51, LDL 74, triglycerides 181. Hemoglobin 13.9, creatinine 1.7, potassium 4.3, ALT 23, TSH 1.260  Risk Assessment/Calculations:    CHA2DS2-VASc Score = 4   This indicates a 4.8% annual risk of stroke. [name] score is based upon: CHF History: 0 HTN History: 1 Diabetes History: 0 Stroke History: 0 Vascular Disease History: 0 Age Score: 2 Gender Score: 1           Physical Exam:    VS:  BP 134/69   Pulse 90   Ht 5' 4 (1.626 m)   Wt 150 lb 11.2 oz (68.4 kg)  LMP 01/23/1988   SpO2 97%   BMI 25.87 kg/m     Wt Readings from Last 3 Encounters:  01/28/24 150 lb 11.2 oz (68.4 kg)  11/08/23 149 lb (67.6 kg)  10/04/23 140 lb (63.5 kg)      General: Alert, oriented x3, no distress, smiling Head: no evidence of trauma, PERRL, EOMI, no exophtalmos or lid lag, no myxedema, no xanthelasma; normal ears, nose and oropharynx Neck: normal jugular venous pulsations and no hepatojugular reflux; brisk carotid pulses without delay and no carotid bruits Chest: clear to auscultation, no signs of consolidation by percussion or palpation, normal fremitus, symmetrical and full respiratory excursions Cardiovascular: normal position and quality of the apical impulse, irregular rhythm, normal first and second heart sounds, no murmurs, rubs or gallops Abdomen: no tenderness or distention, no masses by palpation, no abnormal pulsatility or arterial bruits,  normal bowel sounds, no hepatosplenomegaly Extremities: no clubbing, cyanosis or edema; 2+ radial, ulnar and brachial pulses bilaterally; 2+ right femoral, posterior tibial and dorsalis pedis pulses; 2+ left femoral, posterior tibial and dorsalis pedis pulses; no subclavian or femoral bruits Neurological: grossly nonfocal Psych: Normal mood and affect   ASSESSMENT:    1. Persistent atrial fibrillation (HCC)   2. Essential hypertension   3. Hypercholesterolemia   4. Prediabetes   5. Cognitive deficits     PLAN:    In order of problems listed above:  Afib: Well rate controlled, appropriately anticoagulated.  She has had an ablation and has apparently failed 2 different antiarrhythmics, flecainide  and Multaq .  Currently being managed as permanent atrial fibrillation. HTN: Initially elevated pressure, but normal on recheck at134/69.  She has a history of variable blood pressure and has had numerous intolerances to antihypertensive medications (rash with amlodipine , rash with quinapril , dizziness with chlorthalidone ).  Doing reasonably well on bisoprolol  monotherapy HLP: Acceptable lipid parameters.  Continue rosuvastatin .  She has evidence of aortic atherosclerosis and coronary calcifications, but no clinically established CAD or PAD.  Prediabetes: Diet controlled, compared to last year A1c has improved. Early cognitive issues: Her daughter confirm impression that she is having some memory difficulties.  She is trying to help make sure that her mother gets all her medications appropriately.  Also transition to once daily Xarelto to help with compliance, if this becomes a necessity.           Medication Adjustments/Labs and Tests Ordered: Current medicines are reviewed at length with the patient today.  Concerns regarding medicines are outlined above.  No orders of the defined types were placed in this encounter.  No orders of the defined types were placed in this encounter.   Patient  Instructions  Medication Instructions:  Your physician recommends that you continue on your current medications as directed. Please refer to the Current Medication list given to you today.  *If you need a refill on your cardiac medications before your next appointment, please call your pharmacy*  Lab Work: none If you have labs (blood work) drawn today and your tests are completely normal, you will receive your results only by: MyChart Message (if you have MyChart) OR A paper copy in the mail If you have any lab test that is abnormal or we need to change your treatment, we will call you to review the results.  Testing/Procedures: none  Follow-Up: At [facility], you and your health needs are our priority.  As part of our continuing mission to provide you with exceptional heart care, our providers are all part of  one team.  This team includes your primary Cardiologist (physician) and Advanced Practice Providers or APPs (Physician Assistants and Nurse Practitioners) who all work together to provide you with the care you need, when you need it.  Your next appointment:   12 month(s)  Provider:   Dr Francyne    We recommend signing up for the patient portal called MyChart.  Sign up information is provided on this After Visit Summary.  MyChart is used to connect with patients for Virtual Visits (Telemedicine).  Patients are able to view lab/test results, encounter notes, upcoming appointments, etc.  Non-urgent messages can be sent to your provider as well.   To learn more about what you can do with MyChart, go to [email or web address].   Other Instructions            Signed, Jerel Francyne, MD  02/02/2024 1:50 PM    [facility]  "

## 2024-01-28 NOTE — Patient Instructions (Signed)
 Medication Instructions:  Your physician recommends that you continue on your current medications as directed. Please refer to the Current Medication list given to you today.  *If you need a refill on your cardiac medications before your next appointment, please call your pharmacy*  Lab Work: none If you have labs (blood work) drawn today and your tests are completely normal, you will receive your results only by: MyChart Message (if you have MyChart) OR A paper copy in the mail If you have any lab test that is abnormal or we need to change your treatment, we will call you to review the results.  Testing/Procedures: none  Follow-Up: At [facility], you and your health needs are our priority.  As part of our continuing mission to provide you with exceptional heart care, our providers are all part of one team.  This team includes your primary Cardiologist (physician) and Advanced Practice Providers or APPs (Physician Assistants and Nurse Practitioners) who all work together to provide you with the care you need, when you need it.  Your next appointment:   12 month(s)  Provider:   Dr Francyne    We recommend signing up for the patient portal called MyChart.  Sign up information is provided on this After Visit Summary.  MyChart is used to connect with patients for Virtual Visits (Telemedicine).  Patients are able to view lab/test results, encounter notes, upcoming appointments, etc.  Non-urgent messages can be sent to your provider as well.   To learn more about what you can do with MyChart, go to [email or web address].   Other Instructions

## 2024-10-07 ENCOUNTER — Ambulatory Visit: Admitting: Adult Health
